# Patient Record
Sex: Female | Born: 1940
Health system: Southern US, Community
[De-identification: ages and names within clinical notes are randomized; demographics above are authoritative.]

## PROBLEM LIST (undated history)

## (undated) DIAGNOSIS — M1990 Unspecified osteoarthritis, unspecified site: Secondary | ICD-10-CM

## (undated) DIAGNOSIS — H269 Unspecified cataract: Secondary | ICD-10-CM

## (undated) DIAGNOSIS — T7840XA Allergy, unspecified, initial encounter: Secondary | ICD-10-CM

## (undated) DIAGNOSIS — R51 Headache: Secondary | ICD-10-CM

## (undated) DIAGNOSIS — E049 Nontoxic goiter, unspecified: Secondary | ICD-10-CM

## (undated) DIAGNOSIS — J302 Other seasonal allergic rhinitis: Secondary | ICD-10-CM

## (undated) DIAGNOSIS — R112 Nausea with vomiting, unspecified: Secondary | ICD-10-CM

## (undated) DIAGNOSIS — Z9889 Other specified postprocedural states: Secondary | ICD-10-CM

## (undated) DIAGNOSIS — C801 Malignant (primary) neoplasm, unspecified: Secondary | ICD-10-CM

## (undated) DIAGNOSIS — E785 Hyperlipidemia, unspecified: Secondary | ICD-10-CM

## (undated) DIAGNOSIS — G709 Myoneural disorder, unspecified: Secondary | ICD-10-CM

## (undated) DIAGNOSIS — H409 Unspecified glaucoma: Secondary | ICD-10-CM

## (undated) DIAGNOSIS — K219 Gastro-esophageal reflux disease without esophagitis: Secondary | ICD-10-CM

## (undated) DIAGNOSIS — Z8719 Personal history of other diseases of the digestive system: Secondary | ICD-10-CM

## (undated) HISTORY — PX: BLADDER SURGERY: SHX569

## (undated) HISTORY — PX: COLONOSCOPY: SHX174

## (undated) HISTORY — PX: TONSILLECTOMY: SUR1361

## (undated) HISTORY — DX: Hyperlipidemia, unspecified: E78.5

## (undated) HISTORY — PX: ROTATOR CUFF REPAIR: SHX139

## (undated) HISTORY — DX: Unspecified cataract: H26.9

## (undated) HISTORY — PX: SPINAL FUSION: SHX223

## (undated) HISTORY — DX: Myoneural disorder, unspecified: G70.9

## (undated) HISTORY — PX: BUNIONECTOMY: SHX129

## (undated) HISTORY — DX: Unspecified glaucoma: H40.9

## (undated) HISTORY — DX: Allergy, unspecified, initial encounter: T78.40XA

## (undated) HISTORY — PX: JOINT REPLACEMENT: SHX530

---

## 1998-10-17 ENCOUNTER — Other Ambulatory Visit: Admission: RE | Admit: 1998-10-17 | Discharge: 1998-10-17 | Payer: Self-pay | Admitting: Obstetrics and Gynecology

## 2000-01-01 ENCOUNTER — Other Ambulatory Visit: Admission: RE | Admit: 2000-01-01 | Discharge: 2000-01-01 | Payer: Self-pay | Admitting: Obstetrics and Gynecology

## 2001-01-04 ENCOUNTER — Encounter (INDEPENDENT_AMBULATORY_CARE_PROVIDER_SITE_OTHER): Payer: Self-pay | Admitting: Specialist

## 2001-01-04 ENCOUNTER — Other Ambulatory Visit: Admission: RE | Admit: 2001-01-04 | Discharge: 2001-01-04 | Payer: Self-pay | Admitting: Gastroenterology

## 2001-03-31 ENCOUNTER — Other Ambulatory Visit: Admission: RE | Admit: 2001-03-31 | Discharge: 2001-03-31 | Payer: Self-pay | Admitting: Obstetrics and Gynecology

## 2002-02-02 ENCOUNTER — Ambulatory Visit (HOSPITAL_COMMUNITY): Admission: RE | Admit: 2002-02-02 | Discharge: 2002-02-02 | Payer: Self-pay | Admitting: Neurology

## 2002-05-10 ENCOUNTER — Other Ambulatory Visit: Admission: RE | Admit: 2002-05-10 | Discharge: 2002-05-10 | Payer: Self-pay | Admitting: Obstetrics and Gynecology

## 2002-06-05 ENCOUNTER — Observation Stay (HOSPITAL_COMMUNITY): Admission: AD | Admit: 2002-06-05 | Discharge: 2002-06-06 | Payer: Self-pay | Admitting: *Deleted

## 2002-06-06 ENCOUNTER — Encounter: Payer: Self-pay | Admitting: *Deleted

## 2002-11-02 ENCOUNTER — Encounter (INDEPENDENT_AMBULATORY_CARE_PROVIDER_SITE_OTHER): Payer: Self-pay | Admitting: Specialist

## 2002-11-02 ENCOUNTER — Ambulatory Visit (HOSPITAL_BASED_OUTPATIENT_CLINIC_OR_DEPARTMENT_OTHER): Admission: RE | Admit: 2002-11-02 | Discharge: 2002-11-02 | Payer: Self-pay | Admitting: Urology

## 2003-07-17 ENCOUNTER — Other Ambulatory Visit: Admission: RE | Admit: 2003-07-17 | Discharge: 2003-07-17 | Payer: Self-pay | Admitting: Obstetrics and Gynecology

## 2003-09-03 ENCOUNTER — Ambulatory Visit (HOSPITAL_COMMUNITY): Admission: RE | Admit: 2003-09-03 | Discharge: 2003-09-03 | Payer: Self-pay | Admitting: Gastroenterology

## 2004-05-08 ENCOUNTER — Ambulatory Visit: Payer: Self-pay | Admitting: Internal Medicine

## 2005-03-18 ENCOUNTER — Ambulatory Visit: Payer: Self-pay | Admitting: Internal Medicine

## 2005-03-23 ENCOUNTER — Ambulatory Visit: Payer: Self-pay | Admitting: Internal Medicine

## 2005-04-01 ENCOUNTER — Ambulatory Visit: Payer: Self-pay | Admitting: Gastroenterology

## 2006-01-11 ENCOUNTER — Encounter: Admission: RE | Admit: 2006-01-11 | Discharge: 2006-01-11 | Payer: Self-pay | Admitting: Specialist

## 2006-04-01 ENCOUNTER — Ambulatory Visit: Payer: Self-pay | Admitting: Internal Medicine

## 2006-04-01 LAB — CONVERTED CEMR LAB
ALT: 29 units/L (ref 0–40)
AST: 28 units/L (ref 0–37)
Albumin: 3.8 g/dL (ref 3.5–5.2)
Cholesterol: 173 mg/dL (ref 0–200)
Creatinine, Ser: 0.9 mg/dL (ref 0.4–1.2)
Crystals: NEGATIVE
Eosinophil percent: 5.5 % — ABNORMAL HIGH (ref 0.0–5.0)
Epithelial cells, urine: NEGATIVE /lpf
Folate: >20 ng/mL
GFR calc non Af Amer: 66 mL/min
Glomerular Filtration Rate, Af Am: 80 mL/min/{1.73_m2}
Glucose, Bld: 98 mg/dL (ref 70–99)
HCT: 43.5 % (ref 36.0–46.0)
HDL: 55.7 mg/dL (ref >39.0)
Hemoglobin, Urine: NEGATIVE
LDL Cholesterol: 99 mg/dL (ref 0–99)
MCHC: 33.6 g/dL (ref 30.0–36.0)
Mucus, UA: NEGATIVE
Neutro Abs: 2.5 10*3/uL (ref 1.4–7.7)
Platelets: 309 10*3/uL (ref 150–400)
RBC: 4.47 M/uL (ref 3.87–5.11)
RDW: 12.6 % (ref 11.5–14.6)
Sodium: 143 meq/L (ref 135–145)
T4, Total: 5.6 ug/dL (ref 5.0–12.5)
Total Bilirubin: 0.8 mg/dL (ref 0.3–1.2)
Total Protein, Urine: NEGATIVE mg/dL
WBC: 4.7 10*3/uL (ref 4.5–10.5)

## 2006-04-06 ENCOUNTER — Ambulatory Visit: Payer: Self-pay | Admitting: Internal Medicine

## 2006-05-05 ENCOUNTER — Ambulatory Visit: Payer: Self-pay | Admitting: Internal Medicine

## 2006-06-18 ENCOUNTER — Ambulatory Visit: Payer: Self-pay | Admitting: Gastroenterology

## 2006-07-14 ENCOUNTER — Inpatient Hospital Stay (HOSPITAL_COMMUNITY): Admission: RE | Admit: 2006-07-14 | Discharge: 2006-07-15 | Payer: Self-pay | Admitting: Orthopedic Surgery

## 2007-03-02 ENCOUNTER — Ambulatory Visit (HOSPITAL_COMMUNITY): Admission: RE | Admit: 2007-03-02 | Discharge: 2007-03-02 | Payer: Self-pay | Admitting: Obstetrics and Gynecology

## 2007-04-11 ENCOUNTER — Ambulatory Visit: Payer: Self-pay | Admitting: Internal Medicine

## 2007-04-11 DIAGNOSIS — M1990 Unspecified osteoarthritis, unspecified site: Secondary | ICD-10-CM | POA: Insufficient documentation

## 2007-04-11 DIAGNOSIS — M545 Low back pain, unspecified: Secondary | ICD-10-CM | POA: Insufficient documentation

## 2007-04-11 DIAGNOSIS — Z860100 Personal history of colon polyps, unspecified: Secondary | ICD-10-CM | POA: Insufficient documentation

## 2007-04-11 DIAGNOSIS — R1031 Right lower quadrant pain: Secondary | ICD-10-CM | POA: Insufficient documentation

## 2007-05-12 ENCOUNTER — Ambulatory Visit: Payer: Self-pay | Admitting: Internal Medicine

## 2007-05-12 LAB — CONVERTED CEMR LAB
Basophils Relative: 0.6 % (ref 0.0–1.0)
Bilirubin, Direct: 0.2 mg/dL (ref 0.0–0.3)
CO2: 28 meq/L (ref 19–32)
Creatinine, Ser: 0.9 mg/dL (ref 0.4–1.2)
Eosinophils Relative: 3.1 % (ref 0.0–5.0)
GFR calc Af Amer: 80 mL/min
Glucose, Bld: 97 mg/dL (ref 70–99)
HCT: 39.9 % (ref 36.0–46.0)
HDL: 55.3 mg/dL (ref >39.0)
Hemoglobin: 13.7 g/dL (ref 12.0–15.0)
LDL Cholesterol: 78 mg/dL (ref 0–99)
Lymphocytes Relative: 23 % (ref 12.0–46.0)
Monocytes Absolute: 0.6 10*3/uL (ref 0.2–0.7)
Monocytes Relative: 11.2 % — ABNORMAL HIGH (ref 3.0–11.0)
Neutro Abs: 3.1 10*3/uL (ref 1.4–7.7)
Neutrophils Relative %: 62.1 % (ref 43.0–77.0)
Potassium: 4 meq/L (ref 3.5–5.1)
RDW: 12.8 % (ref 11.5–14.6)
Sodium: 142 meq/L (ref 135–145)
TSH: 1.35 microintl units/mL (ref 0.35–5.50)
Total Bilirubin: 1 mg/dL (ref 0.3–1.2)
Total Protein: 6.3 g/dL (ref 6.0–8.3)
VLDL: 14 mg/dL (ref 0–40)
Vitamin B-12: 765 pg/mL (ref 211–911)
WBC: 5 10*3/uL (ref 4.5–10.5)

## 2007-05-15 ENCOUNTER — Encounter: Payer: Self-pay | Admitting: Internal Medicine

## 2007-05-17 ENCOUNTER — Encounter: Payer: Self-pay | Admitting: Internal Medicine

## 2007-06-23 ENCOUNTER — Encounter: Payer: Self-pay | Admitting: Internal Medicine

## 2007-06-30 ENCOUNTER — Encounter: Payer: Self-pay | Admitting: Internal Medicine

## 2008-02-13 ENCOUNTER — Encounter: Payer: Self-pay | Admitting: Internal Medicine

## 2008-04-30 ENCOUNTER — Ambulatory Visit: Payer: Self-pay | Admitting: Internal Medicine

## 2008-04-30 LAB — CONVERTED CEMR LAB
ALT: 23 units/L (ref 0–35)
AST: 27 units/L (ref 0–37)
Albumin: 3.8 g/dL (ref 3.5–5.2)
Alkaline Phosphatase: 53 units/L (ref 39–117)
BUN: 20 mg/dL (ref 6–23)
Basophils Relative: 0.9 % (ref 0.0–3.0)
Chloride: 111 meq/L (ref 96–112)
Creatinine, Ser: 0.9 mg/dL (ref 0.4–1.2)
Eosinophils Absolute: 0.1 10*3/uL (ref 0.0–0.7)
Eosinophils Relative: 2.9 % (ref 0.0–5.0)
Glucose, Bld: 99 mg/dL (ref 70–99)
HCT: 40.9 % (ref 36.0–46.0)
HDL: 63.8 mg/dL (ref >39.0)
MCV: 96.5 fL (ref 78.0–100.0)
Monocytes Relative: 9.3 % (ref 3.0–12.0)
Neutrophils Relative %: 65.4 % (ref 43.0–77.0)
Platelets: 253 10*3/uL (ref 150–400)
Potassium: 4.5 meq/L (ref 3.5–5.1)
Pro B Natriuretic peptide (BNP): 22 pg/mL (ref 0.0–100.0)
RBC: 4.24 M/uL (ref 3.87–5.11)
Total CHOL/HDL Ratio: 2.9
Total Protein: 6.5 g/dL (ref 6.0–8.3)
WBC: 4.4 10*3/uL — ABNORMAL LOW (ref 4.5–10.5)

## 2008-05-07 ENCOUNTER — Ambulatory Visit: Payer: Self-pay | Admitting: Internal Medicine

## 2008-12-08 ENCOUNTER — Encounter: Admission: RE | Admit: 2008-12-08 | Discharge: 2008-12-08 | Payer: Self-pay | Admitting: Orthopedic Surgery

## 2009-04-03 ENCOUNTER — Encounter: Payer: Self-pay | Admitting: Internal Medicine

## 2009-04-17 ENCOUNTER — Telehealth: Payer: Self-pay | Admitting: Internal Medicine

## 2009-04-17 ENCOUNTER — Ambulatory Visit: Payer: Self-pay | Admitting: Internal Medicine

## 2009-04-17 DIAGNOSIS — R079 Chest pain, unspecified: Secondary | ICD-10-CM

## 2009-04-17 DIAGNOSIS — R0789 Other chest pain: Secondary | ICD-10-CM | POA: Insufficient documentation

## 2009-04-18 ENCOUNTER — Ambulatory Visit: Payer: Self-pay | Admitting: Internal Medicine

## 2009-04-18 DIAGNOSIS — E049 Nontoxic goiter, unspecified: Secondary | ICD-10-CM | POA: Insufficient documentation

## 2009-04-18 DIAGNOSIS — Z87891 Personal history of nicotine dependence: Secondary | ICD-10-CM | POA: Insufficient documentation

## 2009-04-18 LAB — CONVERTED CEMR LAB
BUN: 17 mg/dL (ref 6–23)
Basophils Absolute: 0.1 10*3/uL (ref 0.0–0.1)
Basophils Relative: 1.2 % (ref 0.0–3.0)
Bilirubin Urine: NEGATIVE
Bilirubin, Direct: 0.1 mg/dL (ref 0.0–0.3)
CO2: 28 meq/L (ref 19–32)
Calcium: 9.1 mg/dL (ref 8.4–10.5)
Chloride: 108 meq/L (ref 96–112)
Creatinine, Ser: 0.8 mg/dL (ref 0.4–1.2)
Eosinophils Absolute: 0.2 10*3/uL (ref 0.0–0.7)
Glucose, Bld: 92 mg/dL (ref 70–99)
Hemoglobin, Urine: NEGATIVE
Leukocytes, UA: NEGATIVE
MCHC: 34.5 g/dL (ref 30.0–36.0)
MCV: 98.8 fL (ref 78.0–100.0)
Monocytes Absolute: 0.6 10*3/uL (ref 0.1–1.0)
Neutrophils Relative %: 56.6 % (ref 43.0–77.0)
Nitrite: NEGATIVE
Platelets: 269 10*3/uL (ref 150.0–400.0)
RBC: 4.01 M/uL (ref 3.87–5.11)
RDW: 12.9 % (ref 11.5–14.6)
TSH: 1.22 microintl units/mL (ref 0.35–5.50)
Total Protein: 6.3 g/dL (ref 6.0–8.3)
pH: 6.5 (ref 5.0–8.0)

## 2009-05-02 ENCOUNTER — Encounter: Admission: RE | Admit: 2009-05-02 | Discharge: 2009-05-02 | Payer: Self-pay | Admitting: Internal Medicine

## 2009-06-06 ENCOUNTER — Encounter: Payer: Self-pay | Admitting: Internal Medicine

## 2009-07-16 ENCOUNTER — Telehealth (INDEPENDENT_AMBULATORY_CARE_PROVIDER_SITE_OTHER): Payer: Self-pay | Admitting: *Deleted

## 2009-10-10 ENCOUNTER — Encounter: Payer: Self-pay | Admitting: Internal Medicine

## 2010-01-14 ENCOUNTER — Telehealth: Payer: Self-pay | Admitting: Internal Medicine

## 2010-06-22 ENCOUNTER — Encounter: Payer: Self-pay | Admitting: Obstetrics and Gynecology

## 2010-06-30 ENCOUNTER — Ambulatory Visit
Admission: RE | Admit: 2010-06-30 | Discharge: 2010-06-30 | Payer: Self-pay | Source: Home / Self Care | Attending: Internal Medicine | Admitting: Internal Medicine

## 2010-06-30 ENCOUNTER — Other Ambulatory Visit: Payer: Self-pay | Admitting: Internal Medicine

## 2010-06-30 ENCOUNTER — Encounter: Payer: Self-pay | Admitting: Internal Medicine

## 2010-06-30 DIAGNOSIS — M25519 Pain in unspecified shoulder: Secondary | ICD-10-CM | POA: Insufficient documentation

## 2010-06-30 LAB — HEPATIC FUNCTION PANEL
ALT: 25 U/L (ref 0–35)
AST: 28 U/L (ref 0–37)
Alkaline Phosphatase: 59 U/L (ref 39–117)
Bilirubin, Direct: 0.1 mg/dL (ref 0.0–0.3)
Total Protein: 6.8 g/dL (ref 6.0–8.3)

## 2010-06-30 LAB — CBC WITH DIFFERENTIAL/PLATELET
Basophils Relative: 0.7 % (ref 0.0–3.0)
Eosinophils Relative: 4.7 % (ref 0.0–5.0)
Lymphocytes Relative: 22.8 % (ref 12.0–46.0)
Monocytes Relative: 11.3 % (ref 3.0–12.0)
Neutrophils Relative %: 60.5 % (ref 43.0–77.0)
RBC: 4.34 Mil/uL (ref 3.87–5.11)
WBC: 6 10*3/uL (ref 4.5–10.5)

## 2010-06-30 LAB — URINALYSIS, ROUTINE W REFLEX MICROSCOPIC
Ketones, ur: NEGATIVE
Nitrite: NEGATIVE
Specific Gravity, Urine: 1.025 (ref 1.000–1.030)
Urine Glucose: NEGATIVE
Urobilinogen, UA: 0.2 (ref 0.0–1.0)

## 2010-06-30 LAB — TSH: TSH: 1.67 u[IU]/mL (ref 0.35–5.50)

## 2010-06-30 LAB — BASIC METABOLIC PANEL
Calcium: 9.6 mg/dL (ref 8.4–10.5)
Creatinine, Ser: 0.9 mg/dL (ref 0.4–1.2)

## 2010-06-30 LAB — LIPID PANEL
HDL: 71 mg/dL (ref >39.00)
Total CHOL/HDL Ratio: 3

## 2010-07-01 NOTE — Progress Notes (Signed)
  Phone Note Other Incoming   Request: Send information Summary of Call: [facility] requested last o.v. and last labs. [facility] Faxed 07/16/09 at 8:55 a.m.

## 2010-07-01 NOTE — Letter (Signed)
Summary: [facility]  [facility]   Imported By: Lester Springdale 06/19/2009 08:18:39  _____________________________________________________________________  External Attachment:    Type:   Image     Comment:   External Document

## 2010-07-01 NOTE — Letter (Signed)
Summary: [facility]  [facility]   Imported By: Sherian Rein 11/06/2009 07:44:26  _____________________________________________________________________  External Attachment:    Type:   Image     Comment:   External Document

## 2010-07-01 NOTE — Progress Notes (Signed)
Summary: [facility]  Phone Note Refill Request Message from:  Pharmacy  Refills Requested: Medication #1:  ZOLPIDEM TARTRATE 10 MG  TABS 1/2 or 1 by mouth at hs prn   Dosage confirmed as above?Dosage Confirmed   Supply Requested: 30  Method Requested: Telephone to Pharmacy Initial call taken by: Lanier Prude, [facility]),  January 14, 2010 11:05 AM  Follow-up for Phone Call        ok 3 ref Follow-up by: Tresa Garter MD,  January 14, 2010 2:00 PM  Additional Follow-up for Phone Call Additional follow up Details #1::        Rx called to pharmacy Additional Follow-up by: Lanier Prude, [facility]),  January 14, 2010 3:15 PM    Prescriptions: ZOLPIDEM TARTRATE 10 MG  TABS (ZOLPIDEM TARTRATE) 1/2 or 1 by mouth at hs prn  #30 x 3   Entered by:   Lanier Prude, [facility])   Authorized by:   Tresa Garter MD   Signed by:   Lanier Prude, CMA(AAMA) on 01/14/2010   Method used:   Telephoned to ...       [facility] [address] [phone]* (retail)       [address]       [place], [place]  [address]       Ph: [phone] or [phone]       Fax: [phone]   RxID:   [id number]

## 2010-07-09 NOTE — Assessment & Plan Note (Signed)
Summary: yearly / medicare / # /cd   Vital Signs:  Patient profile:   70 year old female Height:      63 inches Weight:      190 pounds BMI:     33.78 Temp:     98.5 degrees F oral Pulse rate:   104 / minute Pulse rhythm:   regular Resp:     16 per minute BP sitting:   138 / 82  (left arm) Cuff size:   regular  Vitals Entered By: Lanier Prude, CMA(AAMA) (June 30, 2010 9:44 AM) CC: MWV Is Patient Diabetic? No   CC:  MWV.  History of Present Illness: The patient presents for a preventive health examination  Patient past medical history, social history, and family history reviewed in detail no significant changes.  Patient is physically active. Depression is negative and mood is good. Hearing is normal, and able to perform activities of daily living. Risk of falling is negligible and home safety has been reviewed and is appropriate. Patient has normal height, weight, and visual acuity. Patient has been counseled on age-appropriate routine health concerns for screening and prevention. Education, counseling done.   Preventive Screening-Counseling & Management  Alcohol-Tobacco     Alcohol drinks/day: 2-3 /wk     Alcohol type: wine     Smoking Status: never  Caffeine-Diet-Exercise     Caffeine use/day: 1per week     Does Patient Exercise: yes     Type of exercise: trainer, bicycle     Times/week: 7     Depression Counseling: further diagnostic testing and/or other treatment is indicated  Hep-HIV-STD-Contraception     Hepatitis Risk: no risk noted     Dental Visit-last 6 months yes     SBE monthly: yes     Sun Exposure-Excessive: no  Safety-Violence-Falls     Seat Belt Use: yes     Helmet Use: n/a     Firearms in the Home: firearms in the home     Smoke Detectors: yes     Violence in the Home: no risk noted     Sexual Abuse: no     Fall Risk: none  Current Medications (verified): 1)  Prevacid 30 Mg Cpdr (Lansoprazole) .[name].. 1 Po Bid 2)  Zyrtec Allergy 10 Mg  Tabs  (Cetirizine Hcl) .[name].. 1 Once Daily Prn 3)  Synthroid 25 Mcg Tabs (Levothyroxine Sodium) .... Take 1 By Mouth Qd 4)  Celebrex 200 Mg  Caps (Celecoxib) .[name].. 1 Once Daily Prn 5)  Neurontin 100 Mg Caps (Gabapentin) .[name].. 1 At [facility] As Needed 6)  Fish Oil   Oil (Fish Oil) .[name].. 1 Two Times A Day 7)  Zolpidem Tartrate 10 Mg  Tabs (Zolpidem Tartrate) .... 1/2 or 1 By Mouth At [facility] Prn 8)  Multi-Vitamin  Tabs (Multiple Vitamin) .... Once Daily 9)  Tramadol Hcl 50 Mg  Tabs (Tramadol Hcl) .[name].. 1-2 Tabs By Mouth Two Times A Day As Needed Pain 10)  Calcium-Magnesium 500-250 Mg Tabs (Calcium-Magnesium) .[name].. 1 By Mouth Once Daily 11)  Coq10 100 Mg Caps (Coenzyme Q10) .[name].. 1 By Mouth Once Daily 12)  Vitamin B-12 1000 Mcg Tabs (Cyanocobalamin) .[name].. 1 By Mouth Once Daily 13)  Biotin 10 Mg Tabs (Biotin) .[name].. 1 By Mouth Once Daily  Allergies (verified): 1)  ! Pcn  Past History:  Family History: Last updated: 04/11/2007 B pancr CA F brain tumor  Past Medical History: Bladder CA 2003 Dr Vonita Moss Low back pain/ spinal stenosis Insomnia gyn  Dr Ambrose Mantle Colonic polyps, hx of due 2012 Hemorroids Osteoarthritis Goiter Simple liver cysts  Past Surgical History: Rot cuff L 2007 Dr Amanda Pea  Social History: Retired Married Former Smoker Regular exercise-yes golf, [employer] 2/wk Smoking Status:  never Caffeine use/day:  1per week Seat Belt Use:  yes Sun Exposure-Excessive:  no Fall Risk:  none Dental Care w/in 6 mos.:  yes Hepatitis Risk:  no risk noted  Review of Systems       The patient complains of difficulty walking.  The patient denies anorexia, fever, vision loss, decreased hearing, hoarseness, chest pain, syncope, dyspnea on exertion, peripheral edema, prolonged cough, headaches, hemoptysis, abdominal pain, melena, hematochezia, severe indigestion/heartburn, hematuria, incontinence, genital sores, muscle weakness, suspicious skin lesions, transient blindness, depression, unusual weight change,  abnormal bleeding, enlarged lymph nodes, angioedema, and breast masses.         Lost wt on diet  Physical Exam  General:  NAD, alert, well-developed, and overweight-appearing.   Head:  Normocephalic and atraumatic without obvious abnormalities. No apparent alopecia or balding. Eyes:  No corneal or conjunctival inflammation noted. EOMI. Perrla.  Ears:  External ear exam shows no significant lesions or deformities.  Otoscopic examination reveals clear canals, tympanic membranes are intact bilaterally without bulging, retraction, inflammation or discharge. Hearing is grossly normal bilaterally. Nose:  WNL Mouth:  WNL Neck:  No deformities, masses, or tenderness noted. Lungs:  Normal respiratory effort, chest expands symmetrically. Lungs are clear to auscultation, no crackles or wheezes. Heart:  Normal rate and regular rhythm. S1 and S2 normal without gallop, murmur, click, rub or other extra sounds. Abdomen:  Bowel sounds positive,abdomen soft and non-tender without masses, organomegaly or hernias noted. Msk:  Lumbar-sacral spine is tender to palpation over paraspinal muscles and painfull with the ROM  Pulses:  R and L carotid,radial,femoral,dorsalis pedis and posterior tibial pulses are full and equal bilaterally Extremities:  No clubbing, cyanosis, edema, or deformity noted with normal full range of motion of all joints.   Neurologic:  No cranial nerve deficits noted. Station and gait are normal. Plantar reflexes are down-going bilaterally. DTRs are symmetrical throughout. Sensory, motor and coordinative functions appear intact. Skin:  Intact without suspicious lesions or rashes Cervical Nodes:  No lymphadenopathy noted Inguinal Nodes:  No significant adenopathy Psych:  Cognition and judgment appear intact. Alert and cooperative with normal attention span and concentration. No apparent delusions, illusions, hallucinations   Impression & Recommendations:  Problem # 1:  WELL ADULT EXAM  (ICD-V70.0) Assessment New Overall doing well, age appropriate education and counseling updated and referral for appropriate preventive services done unless declined, immunizations up to date or declined, diet counseling done if overweight, urged to quit smoking if smokes, most recent labs reviewed and current ordered if appropriate, ecg reviewed or declined (interpretation per ECG scanned in the EMR if done); information regarding Medicare Preventation requirements given if appropriate.  I have personally reviewed the Medicare Annual Wellness questionnaire and have noted 1.   The patient's medical and social history 2.   Their use of alcohol, tobacco or illicit drugs 3.   Their current medications and supplements 4.   The patient's functional ability including ADL's, fall risks, home safety risks and hearing or visual             impairment. 5.   Diet and physical activities 6.   Evidence for depression or mood disorders The patients weight, height, BMI and visual acuity have been recorded in the chart I have made referrals, counseling and  provided education to the patient based review of the above and I have provided the pt with a written personalized care plan for preventive services.   Orders: TLB-BMP (Basic Metabolic Panel-BMET) (80048-METABOL) TLB-CBC Platelet - w/Differential (85025-CBCD) TLB-Hepatic/Liver Function Pnl (80076-HEPATIC) TLB-Lipid Panel (80061-LIPID) TLB-TSH (Thyroid Stimulating Hormone) (84443-TSH) TLB-Udip ONLY (81003-UDIP) Medicare -1st Annual Wellness Visit [phone])  Problem # 2:  SHOULDER PAIN (ICD-719.41) R Assessment: Deteriorated Surgery is pending - she is medically clear for surgery Her updated medication list for this problem includes:    Celebrex 200 Mg Caps (Celecoxib) .[name]... 1 once daily prn    Tramadol Hcl 50 Mg Tabs (Tramadol hcl) .[name]... 1-2 tabs by mouth two times a day as needed pain  Problem # 3:  COLONIC POLYPS, HX OF (ICD-V12.72) Assessment:  Comment Only Colon is pending in 4/12  Problem # 4:  LOW BACK PAIN (ICD-724.2) Assessment: Unchanged  Her updated medication list for this problem includes:    Celebrex 200 Mg Caps (Celecoxib) .[name]... 1 once daily prn    Tramadol Hcl 50 Mg Tabs (Tramadol hcl) .[name]... 1-2 tabs by mouth two times a day as needed pain    Aspirin 81 Mg Tbec (Aspirin) .[name]... 1 by mouth qd  Complete Medication List: 1)  Prevacid 30 Mg Cpdr (Lansoprazole) .[name].. 1 po bid 2)  Zyrtec Allergy 10 Mg Tabs (Cetirizine hcl) .[name].. 1 once daily prn 3)  Synthroid 25 Mcg Tabs (Levothyroxine sodium) .... Take 1 by mouth qd 4)  Celebrex 200 Mg Caps (Celecoxib) .[name].. 1 once daily prn 5)  Neurontin 100 Mg Caps (Gabapentin) .[name].. 1 at hs as needed 6)  Fish Oil Oil (Fish oil) .[name].. 1 two times a day 7)  Zolpidem Tartrate 10 Mg Tabs (Zolpidem tartrate) .... 1/2 or 1 by mouth at hs prn 8)  Multi-vitamin Tabs (Multiple vitamin) .... Once daily 9)  Tramadol Hcl 50 Mg Tabs (Tramadol hcl) .[name].. 1-2 tabs by mouth two times a day as needed pain 10)  Calcium-magnesium 500-250 Mg Tabs (Calcium-magnesium) .[name].. 1 by mouth once daily 11)  Coq10 100 Mg Caps (Coenzyme q10) .[name].. 1 by mouth once daily 12)  Vitamin B-12 1000 Mcg Tabs (Cyanocobalamin) .[name].. 1 by mouth once daily 13)  Biotin 10 Mg Tabs (Biotin) .[name].. 1 by mouth once daily 14)  Aspirin 81 Mg Tbec (Aspirin) .[name].. 1 by mouth qd  Other Orders: T-2 View CXR, Same Day (71020.5TC)  Patient Instructions: 1)  Please schedule a follow-up appointment in 1 year well w/labs. Prescriptions: ZOLPIDEM TARTRATE 10 MG  TABS (ZOLPIDEM TARTRATE) 1/2 or 1 by mouth at hs prn  #90 x 1   Entered and Authorized by:   Tresa Garter MD   Signed by:   Tresa Garter MD on 06/30/2010   Method used:   Print then Give to Patient   RxID:   [id number] TRAMADOL HCL 50 MG  TABS (TRAMADOL HCL) 1-2 tabs by mouth two times a day as needed pain  #360 x 1   Entered and Authorized by:   Tresa Garter  MD   Signed by:   Tresa Garter MD on 06/30/2010   Method used:   Electronically to        [facility] [address] [phone]* (retail)       [address]       [place], [place]  [address]       Ph: [phone] or [phone]  Fax: [phone]   RxID:   [id number] CELEBREX 200 MG  CAPS (CELECOXIB) 1 once daily prn  #90 x 3   Entered and Authorized by:   Tresa Garter MD   Signed by:   Tresa Garter MD on 06/30/2010   Method used:   Electronically to        [facility] [address] [phone]* (retail)       [address]       [place], [place]  [address]       Ph: [phone] or [phone]       Fax: [phone]   RxID:   [id number] SYNTHROID 25 MCG TABS (LEVOTHYROXINE SODIUM) take 1 by mouth qd  #90 x 3   Entered and Authorized by:   Tresa Garter MD   Signed by:   Tresa Garter MD on 06/30/2010   Method used:   Electronically to        [facility] [address] [phone]* (retail)       [address]       [place], [place]  [address]       Ph: [phone] or [phone]       Fax: [phone]   RxID:   [id number] PREVACID 30 MG CPDR (LANSOPRAZOLE) 1 po bid  #180 x 3   Entered and Authorized by:   Tresa Garter MD   Signed by:   Tresa Garter MD on 06/30/2010   Method used:   Electronically to        [facility] [address] [phone]* (retail)       [address]       [place], [place]  [address]       Ph: [phone] or [phone]       Fax: [phone]   RxID:   [id number]    Orders Added: 1)  T-2 View CXR, Same Day [71020.5TC] 2)  TLB-BMP (Basic Metabolic Panel-BMET) [80048-METABOL] 3)  TLB-CBC Platelet - w/Differential [85025-CBCD] 4)  TLB-Hepatic/Liver Function Pnl [80076-HEPATIC] 5)  TLB-Lipid Panel [80061-LIPID] 6)  TLB-TSH (Thyroid Stimulating Hormone) [84443-TSH] 7)  TLB-Udip ONLY [81003-UDIP] 8)  Medicare -1st Annual Wellness Visit  [G0438] 9)  Est. Patient Level IV [61950]   Immunization History:  Pneumovax Immunization History:    Pneumovax:  historical (03/11/2006)  Influenza Immunization History:    Influenza:  historical (03/04/2010)  Tetanus/Td Immunization History:    Tetanus/Td:  historical (02/11/2006)   Immunization History:  Pneumovax Immunization History:    Pneumovax:  Historical (03/11/2006)  Influenza Immunization History:    Influenza:  Historical (03/04/2010)  Tetanus/Td Immunization History:    Tetanus/Td:  Historical (02/11/2006)

## 2010-09-18 ENCOUNTER — Other Ambulatory Visit: Payer: Self-pay | Admitting: Specialist

## 2010-09-18 ENCOUNTER — Ambulatory Visit
Admission: RE | Admit: 2010-09-18 | Discharge: 2010-09-18 | Disposition: A | Discharge status: Home / Self Care | Payer: Medicare Other | Source: Ambulatory Visit | Attending: Specialist | Admitting: Specialist

## 2010-09-18 DIAGNOSIS — M545 Low back pain: Secondary | ICD-10-CM

## 2010-10-17 NOTE — Op Note (Signed)
NAME:  Mariah Farrell, MAPEL NO.:  [id number]   MEDICAL RECORD NO.:  [id number]          PATIENT TYPE:  OIB   LOCATION:  1503                         FACILITY:  [facility]   PHYSICIAN:  Dionne Ano. Gramig III, M.D.DATE OF BIRTH:  1941/05/21   DATE OF PROCEDURE:  07/13/2006  DATE OF DISCHARGE:                               OPERATIVE REPORT   PREOPERATIVE DIAGNOSIS:  1. Chronic impingement syndrome with large rotator cuff tear, left      shoulder.  2. Left ring finger cystic mass.  3. Right thumb CMC joint arthritis.   POSTOPERATIVE DIAGNOSIS:  1. Chronic impingement syndrome with large rotator cuff tear, left      shoulder.  2. Left ring finger cystic mass.  3. Right thumb CMC joint arthritis.   PROCEDURE:  1. Arthroscopy left shoulder with glenohumeral debridement.  2. Arthroscopy with bursectomy and subacromial decompression, left      shoulder.  3. Open distal clavicle resection, left shoulder.  4. Large rotator cuff repair with suture anchor and push lock fixation      involving the supra and infraspinatus tendons.  5. Mucous cyst removal with the DIP joint synovectomy, left ring      finger.  6. Right thumb CMC injection at the base of the thumb joint.   SURGEON:  Dionne Ano. Amanda Pea, M.D.   ASSISTANT:  Karie Chimera, P.A.-C.   COMPLICATIONS:  None.   ANESTHESIA:  General with preoperative interscalene block.   TOURNIQUET TIME:  Less than of 20 minutes for the left ring finger mass  removal.   INDICATIONS FOR PROCEDURE:  [name] is a 70 year old female  who presents with the above mentioned diagnosis.  I have counseled her  regarding the risks and benefits of surgery including risk of infection,  bleeding, anesthesia, damage to normal structures, and failure of  surgery to accomplish its intended goals of relieving symptoms and  restoring function.  With this in mind, she desires to proceed.  All  questions have been encouraged and answered  preoperatively.   OPERATIVE PROCEDURE:  The patient was seen by myself and anesthesia,  taken to the operating suite, and underwent the smooth induction of  general anesthesia.  Preoperative interscalene block was in excellent  working fashion.  She was placed in the Gackle chair, appropriately  padded with a bump under her left shoulder, and was positioned nicely.  Pulses were checked, body parts were padded, and the left shoulder was  then evaluated under anesthesia revealing adequate stability.  She was  then prepped and draped with DuraPrep x2 and the sterile field was then  secured.   Following this, arthroscopy was performed.  I created a posterolateral  portal and an anterior working portal and performed a systematic  evaluation.  An arthroscopic shaver was used to debride the area.  The  patient tolerated this well.  A large rotator cuff was notable.  Following this, the patient had placement of the arthroscope in the  subacromial space. With a combination thermal ablator and arthroscopic  shaver, I performed bursectomy and subacromial decompression.  The large  delaminated  rotator cuff tear was notable and identified.  Following  this, I then evaluated the [facility] joint which looked significantly abnormal  and synovitic.  This was debrided, as well.  At this juncture, the  subacromial decompression, bursectomy, and glenohumeral debridement was  accomplished and I then removed the arthroscope.   I made an incision and performed a distal clavicle resection.  Dissection was carried down and a distal 9 mm of clavicle was removed  with the oscillating saw.  Bone wax was placed and the joint was nicely  debrided. I then repaired this with 0 Vicryl suture.   Following this, I then performed a transverse incision along Langer's  lines just below the acromion.  Dissection was carried down the interval  between the anterior and middle raphe of the deltoid muscle.  I  identified this and  performed mobilization of a very large rotator cuff  tear.  This is a delaminating tear with significant abrasions about the  cuff in general.  This delaminated tear was mobilized accordingly.  I  then created a foot print for suture anchor.  I medialized the cuff  somewhat and mobilized portions of the bursa off of the subacromial  space.  The patient tolerated this well.  Following this, I then placed  two 5.5 Arthrex corkscrew anchors with four leading sutures a piece.  These were then placed up through the medial portion of the rotator cuff  and tied down with the arm in abduction.  Following this, I placed two  push lock anchors and placed sutures through the eyelets to reinforce  the cuff repair.  The patient tolerated this well.  The patient had  exiting strands from the cuff repair and this was a massive cuff repair  in my opinion.  These were cinched down with the push lock without  difficulty.   Following this, I placed the patient through a full range of motion.  She looked excellent and had no impingement. I was pleased with the  findings.  I then repaired the anterior and middle deltoid raphe and, of  course, performed closure of the subcu with 3-0 Vicryl and skin edge  with Prolene.  The portal sites were closed, as well.   Following this, I then prepped and draped the left ring finger with  Betadine scrub and paint.  She had her ring taken off preoperatively as  it was quite tight and this was done without difficulty.  I performed an  incision. Dissection was carried down and mobilization of the cyst off  of the DIP joint was accomplished.  This was a mucous cyst. The joint  was opened and debrided with a synovectomy rongeur.  I then placed bone  wax about the area and closed the wound with Prolene and the tourniquet  deflated.   Following this, I then performed a CMC joint injection with Celestone and lidocaine which she tolerated well.  Following this, she was placed   in a shoulder abduction orthosis. All wounds were sterilely dressed.  She was taken to the recovery room.  She will be monitored in the  recovery room and be spending the night for IV antibiotics, pain  management according to protocol, and general postop observation.  It  has been a pleasure to participate in her care and we look forward to  participating in her postop recovery.           ______________________________  Dionne Ano. Everlene Other, M.D.     [facility]  D:  07/13/2006  T:  07/13/2006  Job:  [id number]

## 2010-10-17 NOTE — Op Note (Signed)
NAME:  Mariah Farrell, Mariah Farrell                       ACCOUNT NO.:  [id number]   MEDICAL RECORD NO.:  [id number]                   PATIENT TYPE:  AMB   LOCATION:  [facility]                                 FACILITY:  [facility]   PHYSICIAN:  Maretta Bees. Vonita Moss, M.D.             DATE OF BIRTH:  07-24-1940   DATE OF PROCEDURE:  11/02/2002  DATE OF DISCHARGE:                                 OPERATIVE REPORT   PREOPERATIVE DIAGNOSIS:  Recurrent cystitis but rule out bladder carcinoma.   POSTOPERATIVE DIAGNOSIS:  Recurrent cystitis but rule out bladder carcinoma.   PROCEDURE:  Cystoscopy, bilateral retrograde pyelograms with interpretation,  TUR bladder tumor.   SURGEON:  Maretta Bees. Vonita Moss, M.D.   ANESTHESIA:  General.   INDICATIONS FOR PROCEDURE:  This is 70 year old lady has had a two year  history of recurrent bouts of pressure, frequency, and urgency responding to  antibiotics. Recent episode has not cleared very well symptomatically and  despite a urine culture that was negative, she still had persistent  symptoms. She has had no gross hematuria. She underwent cystoscopy earlier  this week and she had raised mucosa around the left ureteral orifice  worrisome for severe inflammation and possible TCC. She is brought to the OR  today for evaluation and therapy.   DESCRIPTION OF PROCEDURE:  The patient was brought to the operating room,  placed in lithotomy position. The external genitalia were prepped and draped  in the usual fashion. She was cystoscoped around and beyond the left  ureteral orifice where raised papillary lesions were very worrisome for TCC  of the bladder. The rest of the bladder had no lesions and the ureteral  orifices were clear. Photos were taken. Bilateral retrograde pyelograms were  obtained using the cone tip catheter and the upper tracts revealed no  filling defects or obstruction. I then inserted a resectoscope and sent a  specimen for frozen section and the rest of the  tissue for permanent section  and totally removed this 3 x 3 cm area of tumors. Because of the CT findings  earlier this week of a thickened bladder wall, I made sure I got some deep  biopsies for pathology. Because of that, I felt that after coagulating tumor  with essentially no blood loss and very good hemostasis that I should leave  in a Foley catheter for a couple of days so a 22 [place] Foley catheter was  inserted and connected to close drainage with clear irrigation and a B&O  suppository inserted per rectum. She was then taken to the recovery room in  good condition having tolerated the procedure well.                                               Maretta Bees. Vonita Moss, M.D.  LJP/MEDQ  D:  11/02/2002  T:  11/02/2002  Job:  [id number]

## 2010-10-17 NOTE — Discharge Summary (Signed)
NAME:  Mariah Farrell, Mariah Farrell                       ACCOUNT NO.:  [id number]   MEDICAL RECORD NO.:  [id number]                   PATIENT TYPE:  INP   LOCATION:  6525                                 FACILITY:  [facility]   PHYSICIAN:  Cecil Cranker, M.D. [facility]         DATE OF BIRTH:  08-23-1940   DATE OF ADMISSION:  06/05/2002  DATE OF DISCHARGE:  06/06/2002                           DISCHARGE SUMMARY - REFERRING   PROCEDURES:  Exercise Cardiolite.   HOSPITAL COURSE:  The patient is a 70 year old female with no known history  of coronary artery disease.  She was seen by Dr. Glennon Hamilton in the office  on 06/05/02, for chest pain.  It was considered that she was at increased risk  for coronary artery disease, so she was admitted to rule out myocardial  infarction and for further evaluation.   On 06/06/01, her enzymes were negative for myocardial infarction, and she had  a Cardiolite performed.  The patient exercised under the Bruce protocol and  reached a heart rate of 162.  Her goal was 133, with a 100% of her target  heart rate being 157.  She exercised for a total of 7 minutes and 40  seconds.  She had no chest pain.  She had no shortness of breath.  Her EKG  was without acute changes.  The Cardiolite images were reviewed, and showed  no scar, no ischemia, and an ejection fraction of 59%.  Her findings were  reviewed with Dr. Glennon Hamilton who felt that the patient could be discharged  home with outpatient followup.   On 06/06/01, the patient was considered stable for discharge with outpatient  followup with her primary care physician, and cardiology followup on a  p.r.n. basis.   LABORATORY DATA:  Hemoglobin 13.6, hematocrit 40, white blood cell count  8.2, platelets 285.  Sodium 144, potassium 3.6, chloride 110, CO2 29, BUN  11, creatinine 0.9, glucose 95.  Albumin 3.4.  CMET values within normal  limits.  Serial CK-MB and troponin-I negative for myocardial infarction.  Total cholesterol  172, triglycerides 76, HDL 70, LDL 87.   CONDITION ON DISCHARGE:  Stable.   DISCHARGE DIAGNOSES:  1. Chest pain, negative myocardial infarction by enzymes and no scar or     ischemia by Cardiolite.  The patient is to follow up with her primary     care physician and seek cardiology on a p.r.n. basis.  2. Esophageal reflux disease.  3. History of allergy to PENICILLIN.  4. Elevated body mass index.   ACTIVITY:  As tolerated.   DIET:  She is to stick to a low fat diet.   FOLLOWUP:  1. She is to follow up with Dr. Posey Rea.  2. She is to follow up with Dr. Corinda Gubler p.r.n.   DISCHARGE MEDICATIONS:  1. Prevacid 30 mg b.i.d.  2. Zyrtec q.d.  3. Multivitamin, calcium, and chondroitin as at home.  4. Estrogen patch as prior to  admission.  5. Aspirin 81 mg q.d.       Lavella Hammock, P.A. [facility]                  E. Graceann Congress, M.D. [facility]    RG/MEDQ  D:  06/06/2002  T:  06/06/2002  Job:  [id number]   cc:   Georgina Quint. Plotnikov, M.D. [facility]  [address]  [place]  [place] [address]  Fax: 1

## 2010-10-17 NOTE — Assessment & Plan Note (Signed)
[facility]                         GASTROENTEROLOGY OFFICE NOTE   NAME:Mariah Farrell, Mariah Farrell                    MRN:          [id number]  DATE:06/18/2006                            DOB:          1941/01/01    HISTORY OF PRESENT ILLNESS:  Mariah Farrell has had difficulty with  dysphagia for years. I dilated her esophagus in 1997 at which time she  had a 3 cm hiatal hernia and severe stricturing. She has been on a  proton pump inhibitor since. She takes Prevacid twice daily. She takes  Celebrex, Zyrtec, Zoloft, furosemide, temazepam.   PHYSICAL EXAMINATION:  She weighed 206. Blood pressure 118/88, pulse of  72 and regular.  HEENT: All unremarkable.  BREASTS: Deferred.  EXTREMITIES: Unremarkable.   IMPRESSION:  1. History of esophagus stricture dilatation, doing well on Prevacid      x2 daily.  2. Mild obesity.   RECOMMENDATIONS:  She should continue on her medicine and I refilled her  medicine. She said that with my retirement she would consider going to  Dr. Kinnie Scales which I told her would be certainly satisfactory and I would  be more than happy to send him all her records at the appropriate time.     Ulyess Mort, MD  Electronically Signed    SML/MedQ  DD: 06/18/2006  DT: 06/18/2006  Job #: [id number]   cc:   Georgina Quint. Plotnikov, MD

## 2010-10-17 NOTE — Assessment & Plan Note (Signed)
[facility]                             PRIMARY CARE OFFICE NOTE   NAME:Mariah Farrell, Mariah Farrell                    MRN:          [id number]  DATE:04/06/2006                            DOB:          1941-01-18    The patient is a 70 year old female who presents for a wellness examination.  She also has a number of complaints.   ALLERGIES:  PENICILLIN.   Past medical history, family history, and social history as per December 19, 2003 note.  She had a ruptured disk in the lower back in August, and a  rotator cuff injury to the left shoulder in May.   CURRENT MEDICATIONS:  Reviewed.  She does take Celebrex lately on a daily  basis.   REVIEW OF SYSTEMS:  Came back sick from [place] and [place] cruise, has been  having dry cough, feeling weak, unable to sleep, arthritis problems.  The  rest is negative.   PHYSICAL EXAMINATION:  Blood pressure 126/87, pulse 98, temp 98.3, weight  204 pounds.  She is in no acute distress.  HEENT:  Moist mucosa.  NECK:  Supple, no thyromegaly or bruit.  LUNGS:  Clear, no wheeze or rales.  HEART:  S1, S2, no murmur, no gallop.  ABDOMEN:  Soft, nontender, no organomegaly, no masses felt.  LOWER EXTREMITIES:  Without edema.  She is alert and cooperative, not depressed.  SKIN:  With aging changes.  Hands and feet with arthritis deformities.   LABORATORIES:  EKG today with sinus tachy, heart rate 89.  On April 01, 2006, CBC normal, cholesterol 173, CMET normal.  Free T4 0.8 (normal 0.9-  1.8).  Urinalysis normal.   ASSESSMENT AND PLAN:  1. Normal wellness examination. Age/health-related issues discussed.      Healthy lifestyle discussed.  We will give her Zostavax injection      today.  She will come back for a hepatitis A and a flu shot in a couple      of weeks.  Her colonoscopy, chest x-ray, mammogram are up to date.      Regular gynecological care with Dr. Katrinka Blazing.  She is menopausal, needs to      take calcium and  vitamin D.  Repeat exam in 12 months.  2. Upper respiratory tract infection/cough.  Given Z-Pack 3 days, chest x-      ray in a week if no better.  3. Weight gain.  We can have her see a nutritionist.  Her free T4 is low,      we can try to help out with Armour Thyroid 15 one daily.  She will      discontinue palpitations.  Obtain another set of thyroid tests in six      weeks.  4. Low free T4.  Plan as above.  5. Edema with travel.  Likely related to venous insufficiency/increased      salt intake during travel/dependent position.  Given Lasix 40 mg one to      one-and-a-half daily as needed, and K-Dur 20 daily as needed.  6. Insomnia.  Temazepam 15-30 mg at bedtime  as needed.  7. Tachycardia.  We will have to recheck if relates to her current cold.  8. Orthopedic problems.  Are taken care of by Dr. Otelia Sergeant.  9. Dyslipidemia, on therapy.  10.Gastroesophageal reflux disease, on therapy.  11.History of bladder cancer.  Under regular surveillance by Dr. Vonita Moss.  12.Follow up appointment with me in 2 to 3 months.    ______________________________  Georgina Quint. Plotnikov, MD    AVP/MedQ  DD: 04/06/2006  DT: 04/07/2006  Job #: [id number]   cc:   Maretta Bees. Vonita Moss, M.D.  Bertis Ruddy., M.D.

## 2010-10-29 ENCOUNTER — Other Ambulatory Visit: Payer: Self-pay | Admitting: Dermatology

## 2010-10-30 ENCOUNTER — Other Ambulatory Visit: Payer: Self-pay | Admitting: Gastroenterology

## 2010-10-30 ENCOUNTER — Encounter: Payer: Self-pay | Admitting: Internal Medicine

## 2010-12-10 ENCOUNTER — Other Ambulatory Visit: Payer: Self-pay | Admitting: Dermatology

## 2010-12-23 ENCOUNTER — Other Ambulatory Visit (HOSPITAL_COMMUNITY): Payer: Medicare Other

## 2010-12-23 ENCOUNTER — Ambulatory Visit (HOSPITAL_COMMUNITY)
Admission: RE | Admit: 2010-12-23 | Discharge: 2010-12-23 | Disposition: A | Discharge status: Home / Self Care | Payer: Medicare Other | Source: Ambulatory Visit | Attending: Neurology | Admitting: Neurology

## 2010-12-23 ENCOUNTER — Other Ambulatory Visit (HOSPITAL_COMMUNITY): Payer: Self-pay | Admitting: Neurology

## 2010-12-23 DIAGNOSIS — R413 Other amnesia: Secondary | ICD-10-CM | POA: Insufficient documentation

## 2010-12-23 DIAGNOSIS — H532 Diplopia: Secondary | ICD-10-CM

## 2010-12-23 DIAGNOSIS — I6789 Other cerebrovascular disease: Secondary | ICD-10-CM | POA: Insufficient documentation

## 2010-12-23 DIAGNOSIS — G319 Degenerative disease of nervous system, unspecified: Secondary | ICD-10-CM | POA: Insufficient documentation

## 2010-12-23 DIAGNOSIS — R930 Abnormal findings on diagnostic imaging of skull and head, not elsewhere classified: Secondary | ICD-10-CM | POA: Insufficient documentation

## 2010-12-23 LAB — CREATININE, SERUM: GFR calc non Af Amer: >60 mL/min (ref >60)

## 2010-12-23 MED ORDER — GADOBENATE DIMEGLUMINE 529 MG/ML IV SOLN
20.0000 mL | Freq: Once | INTRAVENOUS | Status: AC | PRN
  Administered 2010-12-23: 20 mL via INTRAVENOUS

## 2011-06-15 DIAGNOSIS — H40009 Preglaucoma, unspecified, unspecified eye: Secondary | ICD-10-CM | POA: Diagnosis not present

## 2011-06-15 DIAGNOSIS — H40059 Ocular hypertension, unspecified eye: Secondary | ICD-10-CM | POA: Diagnosis not present

## 2011-06-17 DIAGNOSIS — L821 Other seborrheic keratosis: Secondary | ICD-10-CM | POA: Diagnosis not present

## 2011-06-17 DIAGNOSIS — D239 Other benign neoplasm of skin, unspecified: Secondary | ICD-10-CM | POA: Diagnosis not present

## 2011-06-17 DIAGNOSIS — D1801 Hemangioma of skin and subcutaneous tissue: Secondary | ICD-10-CM | POA: Diagnosis not present

## 2011-06-18 DIAGNOSIS — M19049 Primary osteoarthritis, unspecified hand: Secondary | ICD-10-CM | POA: Diagnosis not present

## 2011-06-23 ENCOUNTER — Other Ambulatory Visit: Payer: Self-pay | Admitting: Internal Medicine

## 2011-06-26 DIAGNOSIS — M1710 Unilateral primary osteoarthritis, unspecified knee: Secondary | ICD-10-CM | POA: Diagnosis not present

## 2011-06-30 DIAGNOSIS — J320 Chronic maxillary sinusitis: Secondary | ICD-10-CM | POA: Diagnosis not present

## 2011-08-12 ENCOUNTER — Other Ambulatory Visit: Payer: Self-pay | Admitting: Internal Medicine

## 2011-09-17 ENCOUNTER — Other Ambulatory Visit: Payer: Self-pay | Admitting: Internal Medicine

## 2011-09-20 ENCOUNTER — Other Ambulatory Visit: Payer: Self-pay | Admitting: Internal Medicine

## 2011-09-28 ENCOUNTER — Encounter: Payer: Self-pay | Admitting: Internal Medicine

## 2011-09-28 ENCOUNTER — Ambulatory Visit (INDEPENDENT_AMBULATORY_CARE_PROVIDER_SITE_OTHER): Payer: Medicare Other | Admitting: Internal Medicine

## 2011-09-28 ENCOUNTER — Other Ambulatory Visit (INDEPENDENT_AMBULATORY_CARE_PROVIDER_SITE_OTHER): Payer: Medicare Other

## 2011-09-28 VITALS — BP 120/80 | HR 88 | Temp 98.3°F | Resp 16 | Ht 64.0 in | Wt 189.0 lb

## 2011-09-28 DIAGNOSIS — H9319 Tinnitus, unspecified ear: Secondary | ICD-10-CM | POA: Insufficient documentation

## 2011-09-28 DIAGNOSIS — E559 Vitamin D deficiency, unspecified: Secondary | ICD-10-CM

## 2011-09-28 DIAGNOSIS — M545 Low back pain: Secondary | ICD-10-CM | POA: Diagnosis not present

## 2011-09-28 DIAGNOSIS — M8580 Other specified disorders of bone density and structure, unspecified site: Secondary | ICD-10-CM

## 2011-09-28 DIAGNOSIS — E785 Hyperlipidemia, unspecified: Secondary | ICD-10-CM

## 2011-09-28 DIAGNOSIS — R202 Paresthesia of skin: Secondary | ICD-10-CM

## 2011-09-28 DIAGNOSIS — R209 Unspecified disturbances of skin sensation: Secondary | ICD-10-CM | POA: Diagnosis not present

## 2011-09-28 DIAGNOSIS — Z0000 Encounter for general adult medical examination without abnormal findings: Secondary | ICD-10-CM

## 2011-09-28 DIAGNOSIS — Z1239 Encounter for other screening for malignant neoplasm of breast: Secondary | ICD-10-CM

## 2011-09-28 DIAGNOSIS — M25569 Pain in unspecified knee: Secondary | ICD-10-CM | POA: Insufficient documentation

## 2011-09-28 DIAGNOSIS — E040 Nontoxic diffuse goiter: Secondary | ICD-10-CM

## 2011-09-28 LAB — CBC WITH DIFFERENTIAL/PLATELET
Basophils Absolute: 0.1 10*3/uL (ref 0.0–0.1)
HCT: 42.3 % (ref 36.0–46.0)
Lymphs Abs: 1.2 10*3/uL (ref 0.7–4.0)
Monocytes Absolute: 0.5 10*3/uL (ref 0.1–1.0)
Monocytes Relative: 11.1 % (ref 3.0–12.0)
Neutrophils Relative %: 56.5 % (ref 43.0–77.0)
Platelets: 271 10*3/uL (ref 150.0–400.0)
RDW: 13.3 % (ref 11.5–14.6)

## 2011-09-28 LAB — URINALYSIS, ROUTINE W REFLEX MICROSCOPIC
Bilirubin Urine: NEGATIVE
Ketones, ur: NEGATIVE
Specific Gravity, Urine: 1.025 (ref 1.000–1.030)
Urine Glucose: NEGATIVE
Urobilinogen, UA: 0.2 (ref 0.0–1.0)
pH: 6 (ref 5.0–8.0)

## 2011-09-28 LAB — HEPATIC FUNCTION PANEL
Bilirubin, Direct: 0.2 mg/dL (ref 0.0–0.3)
Total Bilirubin: 0.9 mg/dL (ref 0.3–1.2)

## 2011-09-28 LAB — LIPID PANEL
Cholesterol: 160 mg/dL (ref 0–200)
HDL: 68.1 mg/dL (ref >39.00)
LDL Cholesterol: 73 mg/dL (ref 0–99)
Total CHOL/HDL Ratio: 2
Triglycerides: 93 mg/dL (ref 0.0–149.0)

## 2011-09-28 LAB — VITAMIN B12: Vitamin B-12: 912 pg/mL — ABNORMAL HIGH (ref 211–911)

## 2011-09-28 LAB — TSH: TSH: 1.8 u[IU]/mL (ref 0.35–5.50)

## 2011-09-28 LAB — BASIC METABOLIC PANEL
BUN: 20 mg/dL (ref 6–23)
Calcium: 9.6 mg/dL (ref 8.4–10.5)
Creatinine, Ser: 0.8 mg/dL (ref 0.4–1.2)
GFR: 78.14 mL/min (ref >60.00)
Glucose, Bld: 96 mg/dL (ref 70–99)
Potassium: 4.7 mEq/L (ref 3.5–5.1)

## 2011-09-28 MED ORDER — CELECOXIB 200 MG PO CAPS: 200.0000 mg | ORAL_CAPSULE | Freq: Two times a day (BID) | ORAL | Status: DC

## 2011-09-28 MED ORDER — ZOLPIDEM TARTRATE 10 MG PO TABS: 10.0000 mg | ORAL_TABLET | Freq: Every evening | ORAL | Status: DC | PRN

## 2011-09-28 MED ORDER — LEVOTHYROXINE SODIUM 25 MCG PO TABS: 25.0000 ug | ORAL_TABLET | Freq: Every day | ORAL | Status: DC

## 2011-09-28 MED ORDER — GABAPENTIN 100 MG PO CAPS: 100.0000 mg | ORAL_CAPSULE | Freq: Three times a day (TID) | ORAL | Status: DC

## 2011-09-28 MED ORDER — FUROSEMIDE 20 MG PO TABS: 20.0000 mg | ORAL_TABLET | Freq: Every day | ORAL | Status: DC | PRN

## 2011-09-28 MED ORDER — POTASSIUM CHLORIDE ER 8 MEQ PO TBCR: 8.0000 meq | EXTENDED_RELEASE_TABLET | Freq: Every day | ORAL | Status: DC

## 2011-09-28 NOTE — Assessment & Plan Note (Signed)
Continue with current prescription therapy as reflected on the Med list.  

## 2011-09-28 NOTE — Progress Notes (Signed)
Subjective:    Patient ID: Mariah Farrell, female    DOB: March 19, 1941, 71 y.o.   MRN: [id number]  HPI  The patient is here for a wellness exam. The patient has been doing well overall without major physical or psychological issues going on lately. The patient needs to address  chronic hypothyroidism that has been well controlled with medicine C/o B leg swelling started 1 mo ago. C/o ringing in the ears x long time  BP Readings from Last 3 Encounters:  09/28/11 120/80  06/30/10 138/82  04/18/09 134/100   Wt Readings from Last 3 Encounters:  09/28/11 189 lb (85.73 kg)  06/30/10 190 lb (86.183 kg)  04/18/09 201 lb (91.173 kg)      Review of Systems  Constitutional: Negative for fever, chills, diaphoresis, activity change, appetite change, fatigue and unexpected weight change.  HENT: Negative for hearing loss, ear pain, congestion, sore throat, sneezing, mouth sores, neck pain, dental problem, voice change, postnasal drip and sinus pressure.   Eyes: Negative for pain and visual disturbance.  Respiratory: Negative for cough, chest tightness, wheezing and stridor.   Cardiovascular: Negative for chest pain, palpitations and leg swelling.  Gastrointestinal: Negative for nausea, vomiting, abdominal pain, blood in stool, abdominal distention and rectal pain.  Genitourinary: Negative for dysuria, hematuria, decreased urine volume, vaginal bleeding, vaginal discharge, difficulty urinating, vaginal pain and menstrual problem.  Musculoskeletal: Positive for back pain and arthralgias. Negative for joint swelling and gait problem.  Skin: Negative for color change, rash and wound.  Neurological: Positive for numbness. Negative for dizziness, tremors, syncope, speech difficulty and light-headedness.  Hematological: Negative for adenopathy.  Psychiatric/Behavioral: Negative for suicidal ideas, hallucinations, behavioral problems, confusion, sleep disturbance, dysphoric mood and decreased  concentration. The patient is not hyperactive.        Objective:   Physical Exam  Constitutional: She appears well-developed. No distress.       Obese   HENT:  Head: Normocephalic.  Right Ear: External ear normal.  Left Ear: External ear normal.  Nose: Nose normal.  Mouth/Throat: Oropharynx is clear and moist.  Eyes: Conjunctivae are normal. Pupils are equal, round, and reactive to light. Right eye exhibits no discharge. Left eye exhibits no discharge.  Neck: Normal range of motion. Neck supple. No JVD present. No tracheal deviation present. No thyromegaly present.  Cardiovascular: Normal rate, regular rhythm and normal heart sounds.   Pulmonary/Chest: No stridor. No respiratory distress. She has no wheezes.  Abdominal: Soft. Bowel sounds are normal. She exhibits no distension and no mass. There is no tenderness. There is no rebound and no guarding.  Musculoskeletal: She exhibits tenderness (B knees). She exhibits no edema.  Lymphadenopathy:    She has no cervical adenopathy.  Neurological: She displays abnormal reflex. No cranial nerve deficit. She exhibits normal muscle tone. Coordination normal.  Skin: No rash noted. No erythema.  Psychiatric: She has a normal mood and affect. Her behavior is normal. Judgment and thought content normal.     Lab Results  Component Value Date   WBC 6.0 06/30/2010   HGB 14.7 06/30/2010   HCT 42.0 06/30/2010   PLT 286.0 06/30/2010   GLUCOSE 88 06/30/2010   CHOL 193 06/30/2010   TRIG 99.0 06/30/2010   HDL 71.00 06/30/2010   LDLCALC 102* 06/30/2010   ALT 25 06/30/2010   AST 28 06/30/2010   NA 140 06/30/2010   K 4.1 06/30/2010   CL 101 06/30/2010   CREATININE 0.76 12/23/2010   BUN 15 12/23/2010  CO2 31 06/30/2010   TSH 1.67 06/30/2010        Assessment & Plan:

## 2011-09-28 NOTE — Assessment & Plan Note (Signed)

## 2011-09-28 NOTE — Assessment & Plan Note (Signed)
[place]

## 2011-09-29 ENCOUNTER — Telehealth: Payer: Self-pay | Admitting: Internal Medicine

## 2011-09-29 MED ORDER — CIPROFLOXACIN HCL 250 MG PO TABS: 250.0000 mg | ORAL_TABLET | Freq: Two times a day (BID) | ORAL | Status: AC

## 2011-09-29 NOTE — Telephone Encounter (Signed)
Misty Stanley, please, inform patient that all labs are normal except for a bladder infection Take Cipro  Please, mail the labs to the patient. Thx

## 2011-09-30 NOTE — Telephone Encounter (Signed)
Pt informed/ copies mailed.  

## 2011-10-05 ENCOUNTER — Encounter (HOSPITAL_COMMUNITY): Admission: RE | Payer: Self-pay | Source: Ambulatory Visit

## 2011-10-05 ENCOUNTER — Ambulatory Visit (HOSPITAL_COMMUNITY): Admission: RE | Admit: 2011-10-05 | Payer: Medicare Other | Source: Ambulatory Visit | Admitting: Orthopedic Surgery

## 2011-10-05 SURGERY — ARTHROPLASTY, KNEE, TOTAL
Anesthesia: Spinal | Site: Knee | Laterality: Right

## 2011-10-22 DIAGNOSIS — Z1231 Encounter for screening mammogram for malignant neoplasm of breast: Secondary | ICD-10-CM | POA: Diagnosis not present

## 2011-10-22 DIAGNOSIS — Z13820 Encounter for screening for osteoporosis: Secondary | ICD-10-CM | POA: Diagnosis not present

## 2011-10-28 DIAGNOSIS — M1710 Unilateral primary osteoarthritis, unspecified knee: Secondary | ICD-10-CM | POA: Diagnosis not present

## 2011-11-09 DIAGNOSIS — M19049 Primary osteoarthritis, unspecified hand: Secondary | ICD-10-CM | POA: Diagnosis not present

## 2011-11-25 ENCOUNTER — Encounter: Payer: Self-pay | Admitting: Internal Medicine

## 2011-12-09 DIAGNOSIS — Z8551 Personal history of malignant neoplasm of bladder: Secondary | ICD-10-CM | POA: Diagnosis not present

## 2011-12-09 DIAGNOSIS — N362 Urethral caruncle: Secondary | ICD-10-CM | POA: Diagnosis not present

## 2011-12-09 DIAGNOSIS — N952 Postmenopausal atrophic vaginitis: Secondary | ICD-10-CM | POA: Diagnosis not present

## 2011-12-14 DIAGNOSIS — H40019 Open angle with borderline findings, low risk, unspecified eye: Secondary | ICD-10-CM | POA: Diagnosis not present

## 2011-12-14 DIAGNOSIS — H40059 Ocular hypertension, unspecified eye: Secondary | ICD-10-CM | POA: Diagnosis not present

## 2011-12-31 DIAGNOSIS — M1710 Unilateral primary osteoarthritis, unspecified knee: Secondary | ICD-10-CM | POA: Diagnosis not present

## 2011-12-31 DIAGNOSIS — M25559 Pain in unspecified hip: Secondary | ICD-10-CM | POA: Diagnosis not present

## 2011-12-31 DIAGNOSIS — M545 Low back pain: Secondary | ICD-10-CM | POA: Diagnosis not present

## 2012-03-12 DIAGNOSIS — M1710 Unilateral primary osteoarthritis, unspecified knee: Secondary | ICD-10-CM | POA: Diagnosis not present

## 2012-03-14 ENCOUNTER — Other Ambulatory Visit: Payer: Self-pay | Admitting: Physical Medicine and Rehabilitation

## 2012-03-14 DIAGNOSIS — M4310 Spondylolisthesis, site unspecified: Secondary | ICD-10-CM | POA: Diagnosis not present

## 2012-03-14 DIAGNOSIS — IMO0002 Reserved for concepts with insufficient information to code with codable children: Secondary | ICD-10-CM | POA: Diagnosis not present

## 2012-03-14 DIAGNOSIS — K7689 Other specified diseases of liver: Secondary | ICD-10-CM

## 2012-03-16 DIAGNOSIS — M47817 Spondylosis without myelopathy or radiculopathy, lumbosacral region: Secondary | ICD-10-CM | POA: Diagnosis not present

## 2012-03-18 ENCOUNTER — Ambulatory Visit
Admission: RE | Admit: 2012-03-18 | Discharge: 2012-03-18 | Disposition: A | Discharge status: Home / Self Care | Payer: Medicare Other | Source: Ambulatory Visit | Attending: Physical Medicine and Rehabilitation | Admitting: Physical Medicine and Rehabilitation

## 2012-03-18 DIAGNOSIS — K7689 Other specified diseases of liver: Secondary | ICD-10-CM

## 2012-03-23 DIAGNOSIS — Z23 Encounter for immunization: Secondary | ICD-10-CM | POA: Diagnosis not present

## 2012-04-13 DIAGNOSIS — M47817 Spondylosis without myelopathy or radiculopathy, lumbosacral region: Secondary | ICD-10-CM | POA: Diagnosis not present

## 2012-05-18 DIAGNOSIS — M47817 Spondylosis without myelopathy or radiculopathy, lumbosacral region: Secondary | ICD-10-CM | POA: Diagnosis not present

## 2012-06-16 DIAGNOSIS — M1710 Unilateral primary osteoarthritis, unspecified knee: Secondary | ICD-10-CM | POA: Diagnosis not present

## 2012-06-17 ENCOUNTER — Other Ambulatory Visit: Payer: Self-pay | Admitting: Internal Medicine

## 2012-06-21 ENCOUNTER — Encounter (HOSPITAL_COMMUNITY): Payer: Self-pay | Admitting: Pharmacy Technician

## 2012-06-21 DIAGNOSIS — H40059 Ocular hypertension, unspecified eye: Secondary | ICD-10-CM | POA: Diagnosis not present

## 2012-06-21 DIAGNOSIS — H52209 Unspecified astigmatism, unspecified eye: Secondary | ICD-10-CM | POA: Diagnosis not present

## 2012-06-21 DIAGNOSIS — H40019 Open angle with borderline findings, low risk, unspecified eye: Secondary | ICD-10-CM | POA: Diagnosis not present

## 2012-06-24 ENCOUNTER — Encounter (HOSPITAL_COMMUNITY): Payer: Self-pay

## 2012-06-24 ENCOUNTER — Encounter (HOSPITAL_COMMUNITY)
Admission: RE | Admit: 2012-06-24 | Discharge: 2012-06-24 | Disposition: A | Discharge status: Home / Self Care | Payer: Medicare Other | Source: Ambulatory Visit | Attending: Orthopedic Surgery | Admitting: Orthopedic Surgery

## 2012-06-24 DIAGNOSIS — M1710 Unilateral primary osteoarthritis, unspecified knee: Secondary | ICD-10-CM | POA: Insufficient documentation

## 2012-06-24 DIAGNOSIS — Z01812 Encounter for preprocedural laboratory examination: Secondary | ICD-10-CM | POA: Insufficient documentation

## 2012-06-24 HISTORY — DX: Malignant (primary) neoplasm, unspecified: C80.1

## 2012-06-24 HISTORY — DX: Other specified postprocedural states: Z98.890

## 2012-06-24 HISTORY — DX: Other specified postprocedural states: R11.2

## 2012-06-24 HISTORY — DX: Headache: R51

## 2012-06-24 HISTORY — DX: Unspecified osteoarthritis, unspecified site: M19.90

## 2012-06-24 HISTORY — DX: Nontoxic goiter, unspecified: E04.9

## 2012-06-24 HISTORY — DX: Gastro-esophageal reflux disease without esophagitis: K21.9

## 2012-06-24 LAB — CBC
Hemoglobin: 14.1 g/dL (ref 12.0–15.0)
MCH: 32.3 pg (ref 26.0–34.0)
MCV: 97 fL (ref 78.0–100.0)
RBC: 4.37 MIL/uL (ref 3.87–5.11)
WBC: 7 10*3/uL (ref 4.0–10.5)

## 2012-06-24 LAB — URINALYSIS, ROUTINE W REFLEX MICROSCOPIC
Glucose, UA: NEGATIVE mg/dL
Hgb urine dipstick: NEGATIVE
Protein, ur: NEGATIVE mg/dL
Specific Gravity, Urine: 1.026 (ref 1.005–1.030)
Urobilinogen, UA: 0.2 mg/dL (ref 0.0–1.0)

## 2012-06-24 LAB — URINE MICROSCOPIC-ADD ON

## 2012-06-24 LAB — BASIC METABOLIC PANEL
CO2: 29 mEq/L (ref 19–32)
Calcium: 9.4 mg/dL (ref 8.4–10.5)
Creatinine, Ser: 0.83 mg/dL (ref 0.50–1.10)
Glucose, Bld: 77 mg/dL (ref 70–99)

## 2012-06-24 LAB — PROTIME-INR: Prothrombin Time: 12.7 seconds (ref 11.6–15.2)

## 2012-06-24 LAB — APTT: aPTT: 30 seconds (ref 24–37)

## 2012-06-24 NOTE — H&P (Signed)
TOTAL KNEE ADMISSION H&P  Patient is being admitted for right total knee arthroplasty.  Subjective:  Chief Complaint:   Right knee OA / pain.  HPI: Mariah Farrell, 72 y.o. female, has a history of pain and functional disability in the right knee due to arthritis and has failed non-surgical conservative treatments for greater than 12 weeks to includeNSAID's and/or analgesics, corticosteriod injections, viscosupplementation injections and activity modification.  Onset of symptoms was gradual, starting 5 years ago with gradually worsening course since that time. The patient noted no past surgery on the right knee(s).  Patient currently rates pain in the right knee(s) at 9 out of 10 with activity. Patient has worsening of pain with activity and weight bearing, pain that interferes with activities of daily living, pain with passive range of motion, crepitus and joint swelling.  Patient has evidence of periarticular osteophytes and joint space narrowing by imaging studies. There is no active infection.  Risks, benefits and expectations were discussed with the patient. Patient understand the risks, benefits and expectations and wishes to proceed with surgery.   D/C Plans:   Home with HHPT  Post-op Meds:    No Rx given   Tranexamic Acid:   To be given  Decadron:    To be given  FYI:   Nothing to note    Patient Active Problem List   Diagnosis Date Noted  . Tinnitus 09/28/2011  . Well adult exam 09/28/2011  . Knee pain 09/28/2011  . SHOULDER PAIN 06/30/2010  . GOITER, SIMPLE 04/18/2009  . TOBACCO USE, QUIT 04/18/2009  . CHEST PAIN, UNSPECIFIED 04/17/2009  . OSTEOARTHRITIS 04/11/2007  . LOW BACK PAIN 04/11/2007  . PARESTHESIA 04/11/2007  . ABDOMINAL PAIN, RIGHT LOWER QUADRANT 04/11/2007  . COLONIC POLYPS, HX OF 04/11/2007   Past Medical History  Diagnosis Date  . Goiter   . GERD (gastroesophageal reflux disease)   . Headache   . Cancer 8 years ago    bladder  . Arthritis   . PONV  (postoperative nausea and vomiting)     Past Surgical History  Procedure Date  . Rotator cuff repair     bilateral  . Bunionectomy     bilateral  . Bladder surgery   . Tonsillectomy     No prescriptions prior to admission   Allergies  Allergen Reactions  . Esomeprazole Magnesium     palpitations  . Penicillins     History  Substance Use Topics  . Smoking status: Former Smoker -- 0.2 packs/day for 5 years    Types: Cigarettes    Quit date: 06/01/1964  . Smokeless tobacco: Never Used  . Alcohol Use: Yes     Comment: occasional    Family History  Problem Relation Age of Onset  . Cancer Brother 62    pancreatic     Review of Systems  Constitutional: Negative.   HENT: Positive for tinnitus.   Eyes: Negative.   Respiratory: Negative.   Cardiovascular: Negative.   Gastrointestinal: Negative.   Genitourinary: Positive for urgency.  Musculoskeletal: Negative.   Skin: Negative.   Neurological: Positive for headaches.  Endo/Heme/Allergies: Positive for environmental allergies.  Psychiatric/Behavioral: Negative.     Objective:  Physical Exam  Constitutional: She is oriented to person, place, and time. She appears well-developed and well-nourished.  HENT:  Head: Normocephalic and atraumatic.  Mouth/Throat: Oropharynx is clear and moist.  Eyes: Pupils are equal, round, and reactive to light.  Neck: Neck supple. No JVD present. No tracheal deviation present. No  thyromegaly present.  Cardiovascular: Normal rate, regular rhythm, normal heart sounds and intact distal pulses.   Respiratory: Effort normal and breath sounds normal. No stridor. No respiratory distress. She has no wheezes.  GI: Soft. There is no tenderness. There is no guarding.  Musculoskeletal:       Right hip: She exhibits decreased range of motion, decreased strength, tenderness and bony tenderness. She exhibits no swelling, no deformity and no laceration.  Lymphadenopathy:    She has no cervical  adenopathy.  Neurological: She is alert and oriented to person, place, and time.  Skin: Skin is warm and dry.  Psychiatric: She has a normal mood and affect.     Labs:   Estimated Body mass index is 32.44 kg/(m^2) as calculated from the following:   Height as of 09/28/11: 5\' 4" (1.626 m).   Weight as of 09/28/11: 189 lb(85.73 kg).   Imaging Review Plain radiographs demonstrate severe degenerative joint disease of the right knee(s). The overall alignment is neutral. The bone quality appears to be good for age and reported activity level.  Assessment/Plan:  End stage arthritis, right knee   The patient history, physical examination, clinical judgment of the provider and imaging studies are consistent with end stage degenerative joint disease of the right knee(s) and total knee arthroplasty is deemed medically necessary. The treatment options including medical management, injection therapy arthroscopy and arthroplasty were discussed at length. The risks and benefits of total knee arthroplasty were presented and reviewed. The risks due to aseptic loosening, infection, stiffness, patella tracking problems, thromboembolic complications and other imponderables were discussed. The patient acknowledged the explanation, agreed to proceed with the plan and consent was signed. Patient is being admitted for inpatient treatment for surgery, pain control, PT, OT, prophylactic antibiotics, VTE prophylaxis, progressive ambulation and ADL's and discharge planning. The patient is planning to be discharged home with home health services.    Anastasio Auerbach Zali Kamaka   PAC  06/24/2012, 4:21 PM

## 2012-06-24 NOTE — Patient Instructions (Addendum)
[address]  06/24/2012   Your procedure is scheduled on: 07/19/12   Report to [facility] at [phone].  Call this number if you have problems the morning of surgery 336-: [phone]   Remember:   Do not eat food or drink liquids After Midnight.     Take these medicines the morning of surgery with A SIP OF WATER: synthroid, zyrtec, prevacid   Do not wear jewelry, make-up or nail polish.  Do not wear lotions, powders, or perfumes. You may wear deodorant.  Do not shave 48 hours prior to surgery. Men may shave face and neck.  Do not bring valuables to the hospital.  Contacts, dentures or bridgework may not be worn into surgery.  Leave suitcase in the car. After surgery it may be brought to your room.  For patients admitted to the hospital, checkout time is 11:00 AM the day of discharge.    Please read over the following fact sheets that you were given: MRSA Information, blood fact sheet, incentive spirometer fact sheet Birdie Sons, RN  pre op nurse call if needed [phone]    FAILURE TO FOLLOW THESE INSTRUCTIONS MAY RESULT IN CANCELLATION OF YOUR SURGERY   Patient Signature: ___________________________________________

## 2012-07-18 MED ORDER — CLINDAMYCIN PHOSPHATE 900 MG/50ML IV SOLN
900.0000 mg | INTRAVENOUS | Status: AC
  Administered 2012-07-19: 900 mg via INTRAVENOUS
  Filled 2012-07-18: qty 50

## 2012-07-18 NOTE — Progress Notes (Signed)
Surgery time changed to 0830-0940 from 0900-1010. Pt notified and agreed to arrive at 0600 AM

## 2012-07-18 NOTE — Anesthesia Preprocedure Evaluation (Addendum)
Anesthesia Evaluation  Patient identified by MRN, date of birth, ID band Patient awake    Reviewed: Allergy & Precautions, H&P , NPO status , Patient's Chart, lab work & pertinent test results  History of Anesthesia Complications (+) PONV  Airway Mallampati: II TM Distance: >3 FB Neck ROM: Full    Dental  (+) Dental Advisory Given, Teeth Intact and Caps   Pulmonary neg pulmonary ROS,  breath sounds clear to auscultation        Cardiovascular negative cardio ROS  Rhythm:Regular Rate:Normal     Neuro/Psych  Headaches, negative psych ROS   GI/Hepatic Neg liver ROS, GERD-  Medicated,  Endo/Other  negative endocrine ROS  Renal/GU negative Renal ROS     Musculoskeletal  (+) Arthritis -, Osteoarthritis,    Abdominal   Peds  Hematology negative hematology ROS (+)   Anesthesia Other Findings   Reproductive/Obstetrics                          Anesthesia Physical Anesthesia Plan  ASA: II  Anesthesia Plan: Spinal   Post-op Pain Management:    Induction:   Airway Management Planned: Simple Face Mask  Additional Equipment:   Intra-op Plan:   Post-operative Plan:   Informed Consent: I have reviewed the patients History and Physical, chart, labs and discussed the procedure including the risks, benefits and alternatives for the proposed anesthesia with the patient or authorized representative who has indicated his/her understanding and acceptance.   Dental advisory given  Plan Discussed with: CRNA  Anesthesia Plan Comments:        Anesthesia Quick Evaluation

## 2012-07-19 ENCOUNTER — Inpatient Hospital Stay (HOSPITAL_COMMUNITY)
Admission: RE | Admit: 2012-07-19 | Discharge: 2012-07-20 | DRG: 470 | Disposition: A | Discharge status: Home Health | Payer: Medicare Other | Source: Ambulatory Visit | Attending: Orthopedic Surgery | Admitting: Orthopedic Surgery

## 2012-07-19 ENCOUNTER — Inpatient Hospital Stay (HOSPITAL_COMMUNITY): Payer: Medicare Other | Admitting: Anesthesiology

## 2012-07-19 ENCOUNTER — Encounter (HOSPITAL_COMMUNITY)
Admission: RE | Disposition: A | Discharge status: Home Health | Payer: Self-pay | Source: Ambulatory Visit | Attending: Orthopedic Surgery

## 2012-07-19 ENCOUNTER — Encounter (HOSPITAL_COMMUNITY): Payer: Self-pay | Admitting: *Deleted

## 2012-07-19 ENCOUNTER — Encounter (HOSPITAL_COMMUNITY): Payer: Self-pay | Admitting: Anesthesiology

## 2012-07-19 DIAGNOSIS — M1710 Unilateral primary osteoarthritis, unspecified knee: Secondary | ICD-10-CM | POA: Diagnosis not present

## 2012-07-19 DIAGNOSIS — IMO0002 Reserved for concepts with insufficient information to code with codable children: Secondary | ICD-10-CM | POA: Diagnosis not present

## 2012-07-19 DIAGNOSIS — K219 Gastro-esophageal reflux disease without esophagitis: Secondary | ICD-10-CM | POA: Diagnosis present

## 2012-07-19 DIAGNOSIS — D62 Acute posthemorrhagic anemia: Secondary | ICD-10-CM | POA: Diagnosis not present

## 2012-07-19 DIAGNOSIS — E669 Obesity, unspecified: Secondary | ICD-10-CM | POA: Diagnosis present

## 2012-07-19 DIAGNOSIS — Z880 Allergy status to penicillin: Secondary | ICD-10-CM | POA: Diagnosis not present

## 2012-07-19 DIAGNOSIS — E049 Nontoxic goiter, unspecified: Secondary | ICD-10-CM | POA: Diagnosis present

## 2012-07-19 DIAGNOSIS — Z8551 Personal history of malignant neoplasm of bladder: Secondary | ICD-10-CM

## 2012-07-19 DIAGNOSIS — Z6830 Body mass index (BMI) 30.0-30.9, adult: Secondary | ICD-10-CM | POA: Diagnosis not present

## 2012-07-19 DIAGNOSIS — Z96659 Presence of unspecified artificial knee joint: Secondary | ICD-10-CM

## 2012-07-19 HISTORY — PX: TOTAL KNEE ARTHROPLASTY: SHX125

## 2012-07-19 LAB — TYPE AND SCREEN

## 2012-07-19 SURGERY — ARTHROPLASTY, KNEE, TOTAL
Anesthesia: Spinal | Site: Knee | Laterality: Right | Wound class: Clean

## 2012-07-19 MED ORDER — EPHEDRINE SULFATE 50 MG/ML IJ SOLN
INTRAMUSCULAR | Status: DC | PRN
  Administered 2012-07-19 (×2): 5 mg via INTRAVENOUS

## 2012-07-19 MED ORDER — 0.9 % SODIUM CHLORIDE (POUR BTL) OPTIME
TOPICAL | Status: DC | PRN
  Administered 2012-07-19: 1000 mL

## 2012-07-19 MED ORDER — PANTOPRAZOLE SODIUM 20 MG PO TBEC
20.0000 mg | DELAYED_RELEASE_TABLET | Freq: Every day | ORAL | Status: DC
  Administered 2012-07-20: 20 mg via ORAL
  Filled 2012-07-19: qty 1

## 2012-07-19 MED ORDER — LEVOTHYROXINE SODIUM 25 MCG PO TABS
25.0000 ug | ORAL_TABLET | Freq: Every day | ORAL | Status: DC
  Administered 2012-07-20: 25 ug via ORAL
  Filled 2012-07-19 (×2): qty 1

## 2012-07-19 MED ORDER — FENTANYL CITRATE 0.05 MG/ML IJ SOLN
INTRAMUSCULAR | Status: DC | PRN
  Administered 2012-07-19: 50 ug via INTRAVENOUS

## 2012-07-19 MED ORDER — ALUM & MAG HYDROXIDE-SIMETH 200-200-20 MG/5ML PO SUSP: 30.0000 mL | ORAL | Status: DC | PRN

## 2012-07-19 MED ORDER — METHOCARBAMOL 500 MG PO TABS
500.0000 mg | ORAL_TABLET | Freq: Four times a day (QID) | ORAL | Status: DC | PRN
  Administered 2012-07-20 (×3): 500 mg via ORAL
  Filled 2012-07-19 (×3): qty 1

## 2012-07-19 MED ORDER — ACETAMINOPHEN 10 MG/ML IV SOLN: 1000.0000 mg | Freq: Once | INTRAVENOUS | Status: DC | PRN

## 2012-07-19 MED ORDER — METHOCARBAMOL 100 MG/ML IJ SOLN
500.0000 mg | Freq: Four times a day (QID) | INTRAVENOUS | Status: DC | PRN
  Administered 2012-07-19: 500 mg via INTRAVENOUS
  Filled 2012-07-19: qty 5

## 2012-07-19 MED ORDER — CLINDAMYCIN PHOSPHATE 600 MG/50ML IV SOLN
600.0000 mg | Freq: Four times a day (QID) | INTRAVENOUS | Status: AC
  Administered 2012-07-19 (×2): 600 mg via INTRAVENOUS
  Filled 2012-07-19 (×2): qty 50

## 2012-07-19 MED ORDER — BUPIVACAINE HCL (PF) 0.75 % IJ SOLN
INTRAMUSCULAR | Status: DC | PRN
  Administered 2012-07-19: 15 mg

## 2012-07-19 MED ORDER — ONDANSETRON HCL 4 MG/2ML IJ SOLN
4.0000 mg | Freq: Four times a day (QID) | INTRAMUSCULAR | Status: DC | PRN
  Administered 2012-07-20: 4 mg via INTRAVENOUS
  Filled 2012-07-19: qty 2

## 2012-07-19 MED ORDER — RIVAROXABAN 10 MG PO TABS
10.0000 mg | ORAL_TABLET | ORAL | Status: DC
  Administered 2012-07-20: 10 mg via ORAL
  Filled 2012-07-19 (×2): qty 1

## 2012-07-19 MED ORDER — SODIUM CHLORIDE 0.9 % IV SOLN
INTRAVENOUS | Status: DC
  Administered 2012-07-19 – 2012-07-20 (×2): via INTRAVENOUS
  Filled 2012-07-19 (×9): qty 1000

## 2012-07-19 MED ORDER — PROPOFOL 10 MG/ML IV EMUL
INTRAVENOUS | Status: DC | PRN
  Administered 2012-07-19: 50 ug/kg/min via INTRAVENOUS

## 2012-07-19 MED ORDER — MIDAZOLAM HCL 5 MG/5ML IJ SOLN
INTRAMUSCULAR | Status: DC | PRN
  Administered 2012-07-19: 2 mg via INTRAVENOUS

## 2012-07-19 MED ORDER — PROMETHAZINE HCL 25 MG/ML IJ SOLN: 6.2500 mg | INTRAMUSCULAR | Status: DC | PRN

## 2012-07-19 MED ORDER — CELECOXIB 200 MG PO CAPS
200.0000 mg | ORAL_CAPSULE | Freq: Two times a day (BID) | ORAL | Status: DC
  Administered 2012-07-19 – 2012-07-20 (×3): 200 mg via ORAL
  Filled 2012-07-19 (×4): qty 1

## 2012-07-19 MED ORDER — FLEET ENEMA 7-19 GM/118ML RE ENEM: 1.0000 | ENEMA | Freq: Once | RECTAL | Status: AC | PRN

## 2012-07-19 MED ORDER — OXYCODONE HCL 5 MG PO TABS: 5.0000 mg | ORAL_TABLET | Freq: Once | ORAL | Status: DC | PRN

## 2012-07-19 MED ORDER — OXYCODONE HCL 5 MG/5ML PO SOLN
5.0000 mg | Freq: Once | ORAL | Status: DC | PRN
  Filled 2012-07-19: qty 5

## 2012-07-19 MED ORDER — ONDANSETRON HCL 4 MG PO TABS: 4.0000 mg | ORAL_TABLET | Freq: Four times a day (QID) | ORAL | Status: DC | PRN

## 2012-07-19 MED ORDER — BACID PO TABS
1.0000 | ORAL_TABLET | Freq: Every day | ORAL | Status: DC
  Filled 2012-07-19 (×2): qty 1

## 2012-07-19 MED ORDER — SALINE FLUSH 0.9 % IV SOLN
INTRAVENOUS | Status: DC | PRN
  Administered 2012-07-19: 50 mL

## 2012-07-19 MED ORDER — MAGNESIUM OXIDE 400 MG PO TABS
400.0000 mg | ORAL_TABLET | Freq: Every day | ORAL | Status: DC
  Administered 2012-07-20: 400 mg via ORAL
  Filled 2012-07-19 (×2): qty 1

## 2012-07-19 MED ORDER — HYDROMORPHONE HCL PF 1 MG/ML IJ SOLN
0.5000 mg | INTRAMUSCULAR | Status: DC | PRN
  Administered 2012-07-19: 1 mg via INTRAVENOUS
  Administered 2012-07-19: 0.5 mg via INTRAVENOUS
  Filled 2012-07-19 (×2): qty 1

## 2012-07-19 MED ORDER — POLYETHYLENE GLYCOL 3350 17 G PO PACK
17.0000 g | PACK | Freq: Two times a day (BID) | ORAL | Status: DC
  Administered 2012-07-19 – 2012-07-20 (×2): 17 g via ORAL

## 2012-07-19 MED ORDER — DOCUSATE SODIUM 100 MG PO CAPS
100.0000 mg | ORAL_CAPSULE | Freq: Two times a day (BID) | ORAL | Status: DC
  Administered 2012-07-19 – 2012-07-20 (×2): 100 mg via ORAL

## 2012-07-19 MED ORDER — LORATADINE 10 MG PO TABS
10.0000 mg | ORAL_TABLET | Freq: Every day | ORAL | Status: DC
  Administered 2012-07-20: 10 mg via ORAL
  Filled 2012-07-19: qty 1

## 2012-07-19 MED ORDER — MENTHOL 3 MG MT LOZG: 1.0000 | LOZENGE | OROMUCOSAL | Status: DC | PRN

## 2012-07-19 MED ORDER — SODIUM CHLORIDE 0.9 % IR SOLN
Status: DC | PRN
  Administered 2012-07-19: 1000 mL

## 2012-07-19 MED ORDER — LACTATED RINGERS IV SOLN
INTRAVENOUS | Status: DC | PRN
  Administered 2012-07-19 (×3): via INTRAVENOUS

## 2012-07-19 MED ORDER — GABAPENTIN 300 MG PO CAPS
300.0000 mg | ORAL_CAPSULE | Freq: Every evening | ORAL | Status: DC | PRN
  Filled 2012-07-19: qty 1

## 2012-07-19 MED ORDER — DEXAMETHASONE SODIUM PHOSPHATE 10 MG/ML IJ SOLN
10.0000 mg | Freq: Once | INTRAMUSCULAR | Status: AC
  Administered 2012-07-19: 10 mg via INTRAVENOUS

## 2012-07-19 MED ORDER — DIPHENHYDRAMINE HCL 25 MG PO CAPS: 25.0000 mg | ORAL_CAPSULE | Freq: Four times a day (QID) | ORAL | Status: DC | PRN

## 2012-07-19 MED ORDER — FERROUS SULFATE 325 (65 FE) MG PO TABS
325.0000 mg | ORAL_TABLET | Freq: Three times a day (TID) | ORAL | Status: DC
  Administered 2012-07-20: 325 mg via ORAL
  Filled 2012-07-19 (×6): qty 1

## 2012-07-19 MED ORDER — PHENOL 1.4 % MT LIQD: 1.0000 | OROMUCOSAL | Status: DC | PRN

## 2012-07-19 MED ORDER — BISACODYL 10 MG RE SUPP: 10.0000 mg | Freq: Every day | RECTAL | Status: DC | PRN

## 2012-07-19 MED ORDER — MEPERIDINE HCL 50 MG/ML IJ SOLN: 6.2500 mg | INTRAMUSCULAR | Status: DC | PRN

## 2012-07-19 MED ORDER — ACETAMINOPHEN 10 MG/ML IV SOLN
INTRAVENOUS | Status: DC | PRN
  Administered 2012-07-19: 1000 mg via INTRAVENOUS

## 2012-07-19 MED ORDER — HYDROMORPHONE HCL PF 1 MG/ML IJ SOLN: 0.2500 mg | INTRAMUSCULAR | Status: DC | PRN

## 2012-07-19 MED ORDER — TRANEXAMIC ACID 100 MG/ML IV SOLN
1000.0000 mg | Freq: Once | INTRAVENOUS | Status: AC
  Administered 2012-07-19: 1000 mg via INTRAVENOUS
  Filled 2012-07-19: qty 10

## 2012-07-19 MED ORDER — HYDROMORPHONE HCL 2 MG PO TABS
2.0000 mg | ORAL_TABLET | ORAL | Status: DC
  Administered 2012-07-19: 2 mg via ORAL
  Administered 2012-07-19 – 2012-07-20 (×2): 4 mg via ORAL
  Administered 2012-07-20 (×2): 2 mg via ORAL
  Filled 2012-07-19 (×2): qty 1
  Filled 2012-07-19: qty 2
  Filled 2012-07-19: qty 1
  Filled 2012-07-19: qty 2

## 2012-07-19 MED ORDER — ZOLPIDEM TARTRATE 5 MG PO TABS
5.0000 mg | ORAL_TABLET | Freq: Every evening | ORAL | Status: DC | PRN
  Administered 2012-07-20: 5 mg via ORAL
  Filled 2012-07-19: qty 1

## 2012-07-19 MED ORDER — ONDANSETRON HCL 4 MG/2ML IJ SOLN
INTRAMUSCULAR | Status: DC | PRN
  Administered 2012-07-19: 4 mg via INTRAVENOUS

## 2012-07-19 MED ORDER — DEXAMETHASONE SODIUM PHOSPHATE 10 MG/ML IJ SOLN
10.0000 mg | Freq: Once | INTRAMUSCULAR | Status: AC
  Administered 2012-07-20: 10 mg via INTRAVENOUS
  Filled 2012-07-19: qty 1

## 2012-07-19 MED ORDER — BUPIVACAINE LIPOSOME 1.3 % IJ SUSP
20.0000 mL | Freq: Once | INTRAMUSCULAR | Status: AC
  Administered 2012-07-19: 20 mL
  Filled 2012-07-19: qty 20

## 2012-07-19 SURGICAL SUPPLY — 57 items
BAG ZIPLOCK 12X15 (MISCELLANEOUS) ×2 IMPLANT
BANDAGE ELASTIC 6 VELCRO ST LF (GAUZE/BANDAGES/DRESSINGS) ×2 IMPLANT
BANDAGE ESMARK 6X9 LF (GAUZE/BANDAGES/DRESSINGS) ×1 IMPLANT
BLADE SAW SGTL 13.0X1.19X90.0M (BLADE) ×2 IMPLANT
BNDG ESMARK 6X9 LF (GAUZE/BANDAGES/DRESSINGS) ×2
BOWL SMART MIX CTS (DISPOSABLE) ×2 IMPLANT
CEMENT HV SMART SET (Cement) ×4 IMPLANT
CHLORAPREP W/TINT 26ML (MISCELLANEOUS) ×2 IMPLANT
CLOTH BEACON ORANGE TIMEOUT ST (SAFETY) ×2 IMPLANT
CUFF TOURN SGL QUICK 34 (TOURNIQUET CUFF) ×1
CUFF TRNQT CYL 34X4X40X1 (TOURNIQUET CUFF) ×1 IMPLANT
DECANTER SPIKE VIAL GLASS SM (MISCELLANEOUS) ×2 IMPLANT
DERMABOND ADVANCED (GAUZE/BANDAGES/DRESSINGS) ×1
DERMABOND ADVANCED .7 DNX12 (GAUZE/BANDAGES/DRESSINGS) ×1 IMPLANT
DRAPE EXTREMITY T 121X128X90 (DRAPE) ×2 IMPLANT
DRAPE INCISE 23X17 IOBAN STRL (DRAPES) ×1
DRAPE INCISE IOBAN 23X17 STRL (DRAPES) ×1 IMPLANT
DRAPE POUCH INSTRU U-SHP 10X18 (DRAPES) ×2 IMPLANT
DRAPE U-SHAPE 47X51 STRL (DRAPES) ×2 IMPLANT
DRSG AQUACEL AG ADV 3.5X10 (GAUZE/BANDAGES/DRESSINGS) ×2 IMPLANT
DRSG TEGADERM 4X4.75 (GAUZE/BANDAGES/DRESSINGS) ×2 IMPLANT
ELECT REM PT RETURN 9FT ADLT (ELECTROSURGICAL) ×2
ELECTRODE REM PT RTRN 9FT ADLT (ELECTROSURGICAL) ×1 IMPLANT
EVACUATOR 1/8 PVC DRAIN (DRAIN) ×2 IMPLANT
FACESHIELD LNG OPTICON STERILE (SAFETY) ×10 IMPLANT
GAUZE SPONGE 2X2 8PLY STRL LF (GAUZE/BANDAGES/DRESSINGS) ×1 IMPLANT
GLOVE BIOGEL PI IND STRL 7.5 (GLOVE) ×1 IMPLANT
GLOVE BIOGEL PI IND STRL 8 (GLOVE) ×1 IMPLANT
GLOVE BIOGEL PI INDICATOR 7.5 (GLOVE) ×1
GLOVE BIOGEL PI INDICATOR 8 (GLOVE) ×1
GLOVE ECLIPSE 8.0 STRL XLNG CF (GLOVE) ×2 IMPLANT
GLOVE ORTHO TXT STRL SZ7.5 (GLOVE) ×4 IMPLANT
GOWN BRE IMP PREV XXLGXLNG (GOWN DISPOSABLE) ×4 IMPLANT
GOWN STRL NON-REIN LRG LVL3 (GOWN DISPOSABLE) ×2 IMPLANT
HANDPIECE INTERPULSE COAX TIP (DISPOSABLE) ×1
IMMOBILIZER KNEE 20 (SOFTGOODS)
IMMOBILIZER KNEE 20 THIGH 36 (SOFTGOODS) IMPLANT
KIT BASIN OR (CUSTOM PROCEDURE TRAY) ×2 IMPLANT
MANIFOLD NEPTUNE II (INSTRUMENTS) ×2 IMPLANT
NDL SAFETY ECLIPSE 18X1.5 (NEEDLE) ×1 IMPLANT
NEEDLE HYPO 18GX1.5 SHARP (NEEDLE) ×1
NS IRRIG 1000ML POUR BTL (IV SOLUTION) ×2 IMPLANT
PACK TOTAL JOINT (CUSTOM PROCEDURE TRAY) ×2 IMPLANT
POSITIONER SURGICAL ARM (MISCELLANEOUS) ×2 IMPLANT
SET HNDPC FAN SPRY TIP SCT (DISPOSABLE) ×1 IMPLANT
SET PAD KNEE POSITIONER (MISCELLANEOUS) ×2 IMPLANT
SPONGE GAUZE 2X2 STER 10/PKG (GAUZE/BANDAGES/DRESSINGS) ×1
SUCTION FRAZIER 12FR DISP (SUCTIONS) ×2 IMPLANT
SUT MNCRL AB 4-0 PS2 18 (SUTURE) ×2 IMPLANT
SUT VIC AB 1 CT1 36 (SUTURE) ×6 IMPLANT
SUT VIC AB 2-0 CT1 27 (SUTURE) ×3
SUT VIC AB 2-0 CT1 TAPERPNT 27 (SUTURE) ×3 IMPLANT
SYR 50ML LL SCALE MARK (SYRINGE) ×2 IMPLANT
TOWEL OR 17X26 10 PK STRL BLUE (TOWEL DISPOSABLE) ×4 IMPLANT
TRAY FOLEY CATH 14FRSI W/METER (CATHETERS) ×2 IMPLANT
WATER STERILE IRR 1500ML POUR (IV SOLUTION) ×4 IMPLANT
WRAP KNEE MAXI GEL POST OP (GAUZE/BANDAGES/DRESSINGS) ×2 IMPLANT

## 2012-07-19 NOTE — Anesthesia Postprocedure Evaluation (Signed)
Anesthesia Post Note  Patient: Mariah Farrell  Procedure(s) Performed: Procedure(s) (LRB): TOTAL KNEE ARTHROPLASTY (Right)  Anesthesia type: Spinal  Patient location: PACU  Post pain: Pain level controlled  Post assessment: Post-op Vital signs reviewed  Last Vitals: BP 134/71  Pulse 70  Temp(Src) 36.4 C (Oral)  Resp 16  Ht 5\' 2"  (1.575 m)  Wt 165 lb (74.844 kg)  BMI 30.17 kg/m2  SpO2 100%  Post vital signs: Reviewed  Level of consciousness: sedated  Complications: No apparent anesthesia complications

## 2012-07-19 NOTE — Transfer of Care (Signed)
Immediate Anesthesia Transfer of Care Note  Patient: Mariah Farrell  Procedure(s) Performed: Procedure(s): TOTAL KNEE ARTHROPLASTY (Right)  Patient Location: PACU  Anesthesia Type:Spinal  Level of Consciousness: awake  Airway & Oxygen Therapy: Patient Spontanous Breathing and Patient connected to face mask oxygen  Post-op Assessment: Report given to PACU RN and Post -op Vital signs reviewed and stable  Post vital signs: Reviewed and stable  Complications: No apparent anesthesia complications

## 2012-07-19 NOTE — Evaluation (Signed)
Physical Therapy Evaluation Patient Details Name: Mariah Farrell MRN: [id number] DOB: 08-05-1940 Today's Date: 07/19/2012 Time: 4098-1191 PT Time Calculation (min): 35 min  PT Assessment / Plan / Recommendation Clinical Impression  Pt s/p R TKR presents with decreased R LE strength/ROM and post op pain limiting functional mobility    PT Assessment  Patient needs continued PT services    Follow Up Recommendations  Home health PT    Does the patient have the potential to tolerate intense rehabilitation      Barriers to Discharge None      Equipment Recommendations  None recommended by PT    Recommendations for Other Services OT consult   Frequency 7X/week    Precautions / Restrictions Precautions Precautions: Knee;Fall Restrictions Weight Bearing Restrictions: No Other Position/Activity Restrictions: WBAT   Pertinent Vitals/Pain 5/10 with activity; premed, ice packs provided      Mobility  Bed Mobility Bed Mobility: Supine to Sit Supine to Sit: 4: Min assist Details for Bed Mobility Assistance: min cues for sequence and use of L LE to self assist Transfers Transfers: Sit to Stand;Stand to Sit Sit to Stand: 4: Min assist Stand to Sit: 4: Min assist Details for Transfer Assistance: cues for LE managment and use of UEs to self assist Ambulation/Gait Ambulation/Gait Assistance: 4: Min assist Ambulation Distance (Feet): 29 Feet Assistive device: Rolling walker Ambulation/Gait Assistance Details: cues for sequence. posture, and position from RW Gait Pattern: Step-to pattern Stairs: No    Exercises Total Joint Exercises Ankle Circles/Pumps: AROM;Both;10 reps;Supine Quad Sets: AROM;Both;10 reps;Supine Heel Slides: AAROM;10 reps;Supine;Right Straight Leg Raises: AAROM;Right;10 reps;Supine;AROM   PT Diagnosis: Difficulty walking  PT Problem List: Decreased strength;Decreased range of motion;Decreased activity tolerance;Decreased mobility;Decreased knowledge of use  of DME;Pain PT Treatment Interventions: DME instruction;Gait training;Stair training;Functional mobility training;Therapeutic activities;Therapeutic exercise;Patient/family education   PT Goals Acute Rehab PT Goals PT Goal Formulation: With patient Time For Goal Achievement: 07/27/12 Potential to Achieve Goals: Good Pt will go Supine/Side to Sit: with supervision PT Goal: Supine/Side to Sit - Progress: Goal set today Pt will go Sit to Supine/Side: with supervision PT Goal: Sit to Supine/Side - Progress: Goal set today Pt will go Sit to Stand: with supervision PT Goal: Sit to Stand - Progress: Goal set today Pt will go Stand to Sit: with supervision PT Goal: Stand to Sit - Progress: Goal set today Pt will Ambulate: 51 - 150 feet;with supervision;with rolling walker PT Goal: Ambulate - Progress: Goal set today Pt will Go Up / Down Stairs: 1-2 stairs;with min assist;with least restrictive assistive device PT Goal: Up/Down Stairs - Progress: Goal set today  Visit Information  Last PT Received On: 07/19/12 Assistance Needed: +1    Subjective Data  Subjective: I was out golfing right before surgery Patient Stated Goal: Resume previous active lifestyle with decreased pain   Prior Functioning  Home Living Lives With: Spouse Available Help at Discharge: Family Type of Home: House Home Access: Stairs to enter Secretary/administrator of Steps: 2 Entrance Stairs-Rails: None Home Layout: One level Home Adaptive Equipment: Walker - rolling Prior Function Level of Independence: Independent Able to Take Stairs?: Yes Driving: Yes Vocation: Retired Musician: No difficulties    Copywriter, advertising Overall Cognitive Status: Appears within functional limits for tasks assessed/performed Arousal/Alertness: Awake/alert Orientation Level: Appears intact for tasks assessed Behavior During Session: [facility] for tasks performed    Extremity/Trunk Assessment Right Upper  Extremity Assessment RUE ROM/Strength/Tone: [facility] for tasks assessed Left Upper Extremity Assessment LUE ROM/Strength/Tone: [facility]  for tasks assessed Right Lower Extremity Assessment RLE ROM/Strength/Tone: Deficits RLE ROM/Strength/Tone Deficits: 3/5 quads with pt performing IND SLR; AAROm at knee -10 - 50 Left Lower Extremity Assessment LLE ROM/Strength/Tone: WFL for tasks assessed   Balance    End of Session PT - End of Session Equipment Utilized During Treatment: Gait belt Activity Tolerance: Patient tolerated treatment well Patient left: in chair;with call bell/phone within reach;with family/visitor present Nurse Communication: Mobility status  GP     Katelyne Galster 07/19/2012, 5:44 PM

## 2012-07-19 NOTE — Interval H&P Note (Signed)
History and Physical Interval Note:  07/19/2012 6:47 AM  Mariah Farrell  has presented today for surgery, with the diagnosis of right knee oa  The various methods of treatment have been discussed with the patient and family. After consideration of risks, benefits and other options for treatment, the patient has consented to  Procedure(s): TOTAL KNEE ARTHROPLASTY (Right) as a surgical intervention .  The patient's history has been reviewed, patient examined, no change in status, stable for surgery.  I have reviewed the patient's chart and labs.  Questions were answered to the patient's satisfaction.     Shelda Pal

## 2012-07-19 NOTE — Progress Notes (Signed)
Utilization review completed.  

## 2012-07-19 NOTE — Op Note (Signed)
NAME:  JAYMEE TILSON                      MEDICAL RECORD NO.:  [id number]                             FACILITY:  [facility]      PHYSICIAN:  Madlyn Frankel. Charlann Boxer, M.D.  DATE OF BIRTH:  [birth year]      DATE OF PROCEDURE:  07/19/2012                                     OPERATIVE REPORT         PREOPERATIVE DIAGNOSIS:  Right knee osteoarthritis.      POSTOPERATIVE DIAGNOSIS:  Right knee osteoarthritis.      FINDINGS:  The patient was noted to have complete loss of cartilage and   bone-on-bone arthritis with associated osteophytes in the medial and patellofemoral compartments of   the knee with a significant synovitis and associated effusion.      PROCEDURE:  Right total knee replacement.      COMPONENTS USED:  DePuy rotating platform posterior stabilized knee   system, a size 3 femur, 2.5 tibia, 10 mm PS insert, and 38 patellar   button.      SURGEON:  Madlyn Frankel. Charlann Boxer, M.D.      ASSISTANT:  Lanney Gins, PA-C.      ANESTHESIA:  Spinal.      SPECIMENS:  None.      COMPLICATION:  None.      DRAINS:  One Hemovac.  EBL: <100cc      TOURNIQUET TIME:   Total Tourniquet Time Documented: Thigh (Right) - 33 minutes Total: Thigh (Right) - 33 minutes  .      The patient was stable to the recovery room.      INDICATION FOR PROCEDURE:  KAMIRAH SHUGRUE is a 72 y.o. female patient of   mine.  The patient had been seen, evaluated, and treated conservatively in the   office with medication, activity modification, and injections.  The patient had   radiographic changes of bone-on-bone arthritis with endplate sclerosis and osteophytes noted.      The patient failed conservative measures including medication, injections, and activity modification, and at this point was ready for more definitive measures.   Based on the radiographic changes and failed conservative measures, the patient   decided to proceed with total knee replacement.  Risks of infection,   DVT, component failure, need  for revision surgery, postop course, and   expectations were all   discussed and reviewed.  Consent was obtained for benefit of pain   relief.      PROCEDURE IN DETAIL:  The patient was brought to the operative theater.   Once adequate anesthesia, preoperative antibiotics, 900mg  of Clindamycin administered, the patient was positioned supine with the right thigh tourniquet placed.  The  right lower extremity was prepped and draped in sterile fashion.  A time-   out was performed identifying the patient, planned procedure, and   extremity.      The right lower extremity was placed in the [facility] leg holder.  The leg was   exsanguinated, tourniquet elevated to 250 mmHg.  A midline incision was   made followed by median parapatellar arthrotomy.  Following initial   exposure, attention was first  directed to the patella.  Precut   measurement was noted to be 21 mm.  I resected down to 13 mm and used a   38 patellar button to restore patellar height as well as cover the cut   surface.      The lug holes were drilled and a metal shim was placed to protect the   patella from retractors and saw blades.      At this point, attention was now directed to the femur.  The femoral   canal was opened with a drill, irrigated to try to prevent fat emboli.  An   intramedullary rod was passed at 3 degrees valgus, 10 mm of bone was   resected off the distal femur.  Following this resection, the tibia was   subluxated anteriorly.  Using the extramedullary guide, 10 mm of bone was resected off   the proximal lateral tibia.  We confirmed the gap would be   stable medially and laterally with a 10 mm insert as well as confirmed   the cut was perpendicular in the coronal plane, checking with an alignment rod.      Once this was done, I sized the femur to be a size 3 in the anterior-   posterior dimension, chose a standard component based on medial and   lateral dimension.  The size 3 rotation block was then  pinned in   position anterior referenced using the C-clamp to set rotation.  The   anterior, posterior, and  chamfer cuts were made without difficulty nor   notching making certain that I was along the anterior cortex to help   with flexion gap stability.      The final box cut was made off the lateral aspect of distal femur.      At this point, the tibia was sized to be a size 2.5, the size 2.5 tray was   then pinned in position through the medial third of the tubercle,   drilled, and keel punched.  Trial reduction was now carried with a 3 femur,  2.5 tibia, a 10 mm insert, and the 38 patella botton.  The knee was brought to   extension, full extension with good flexion stability with the patella   tracking through the trochlea without application of pressure.  Given   all these findings, the trial components removed.  Final components were   opened and cement was mixed.  The knee was irrigated with normal saline   solution and pulse lavage.  The synovial lining was   then injected with 0.25% Marcaine with epinephrine and 1 cc of Toradol,   total of 61 cc.      The knee was irrigated.  Final implants were then cemented onto clean and   dried cut surfaces of bone with the knee brought to extension with a 10 mm trial insert.      Once the cement had fully cured, the excess cement was removed   throughout the knee.  I confirmed I was satisfied with the range of   motion and stability, and the final 10 mm PS insert was chosen.  It was   placed into the knee.      The tourniquet had been let down at 32 minutes.  No significant   hemostasis required.  The medium Hemovac drain was placed deep.  The   extensor mechanism was then reapproximated using #1 Vicryl with the knee   in flexion.  The  remaining wound was closed with 2-0 Vicryl and running 4-0 Monocryl.   The knee was cleaned, dried, dressed sterilely using Dermabond and   Aquacel dressing.  Drain site dressed separately.  The  patient was then   brought to recovery room in stable condition, tolerating the procedure   well.   Please note that Physician Assistant, Lanney Gins, was present for the entirety of the case, and was utilized for pre-operative positioning, peri-operative retractor management, general facilitation of the procedure.  He was also utilized for primary wound closure at the end of the case.              Madlyn Frankel Charlann Boxer, M.D.

## 2012-07-19 NOTE — Anesthesia Procedure Notes (Signed)
Spinal  Patient location during procedure: OR Start time: 07/19/2012 8:30 AM End time: 07/19/2012 8:33 AM Staffing Anesthesiologist: Gaylan Gerold CRNA/Resident: Carmelia Roller R Preanesthetic Checklist Completed: patient identified, site marked, surgical consent, pre-op evaluation, timeout performed, IV checked, risks and benefits discussed and monitors and equipment checked Spinal Block Patient position: sitting Prep: Betadine Patient monitoring: heart rate Approach: midline Location: L3-4 Injection technique: single-shot Needle Needle gauge: 22 G Needle length: 9 cm Needle insertion depth: 7 cm

## 2012-07-20 ENCOUNTER — Encounter (HOSPITAL_COMMUNITY): Payer: Self-pay | Admitting: Orthopedic Surgery

## 2012-07-20 DIAGNOSIS — E669 Obesity, unspecified: Secondary | ICD-10-CM | POA: Diagnosis present

## 2012-07-20 LAB — BASIC METABOLIC PANEL
BUN: 14 mg/dL (ref 6–23)
Calcium: 8.2 mg/dL — ABNORMAL LOW (ref 8.4–10.5)
GFR calc Af Amer: >90 mL/min (ref >90)
GFR calc non Af Amer: 85 mL/min — ABNORMAL LOW (ref >90)
Potassium: 3.7 mEq/L (ref 3.5–5.1)
Sodium: 140 mEq/L (ref 135–145)

## 2012-07-20 LAB — CBC
HCT: 34.1 % — ABNORMAL LOW (ref 36.0–46.0)
MCH: 33 pg (ref 26.0–34.0)
MCHC: 34 g/dL (ref 30.0–36.0)
RDW: 12.9 % (ref 11.5–15.5)

## 2012-07-20 MED ORDER — DIPHENHYDRAMINE HCL 25 MG PO CAPS: 25.0000 mg | ORAL_CAPSULE | Freq: Four times a day (QID) | ORAL | Status: DC | PRN

## 2012-07-20 MED ORDER — HYDROMORPHONE HCL 2 MG PO TABS
2.0000 mg | ORAL_TABLET | ORAL | Status: DC
  Administered 2012-07-20 (×3): 4 mg via ORAL
  Filled 2012-07-20 (×3): qty 2

## 2012-07-20 MED ORDER — HYDROMORPHONE HCL 2 MG PO TABS: 2.0000 mg | ORAL_TABLET | ORAL | Status: DC | PRN

## 2012-07-20 MED ORDER — ACETAMINOPHEN 500 MG PO TABS: 1000.0000 mg | ORAL_TABLET | Freq: Three times a day (TID) | ORAL | Status: DC

## 2012-07-20 MED ORDER — DSS 100 MG PO CAPS: 100.0000 mg | ORAL_CAPSULE | Freq: Two times a day (BID) | ORAL | Status: DC

## 2012-07-20 MED ORDER — ASPIRIN EC 325 MG PO TBEC: 325.0000 mg | DELAYED_RELEASE_TABLET | Freq: Two times a day (BID) | ORAL | Status: DC

## 2012-07-20 MED ORDER — LACTINEX PO CHEW
1.0000 | CHEWABLE_TABLET | Freq: Every day | ORAL | Status: DC
  Administered 2012-07-20: 1 via ORAL
  Filled 2012-07-20: qty 1

## 2012-07-20 MED ORDER — TIZANIDINE HCL 4 MG PO CAPS: 4.0000 mg | ORAL_CAPSULE | Freq: Three times a day (TID) | ORAL | Status: DC

## 2012-07-20 MED ORDER — ACETAMINOPHEN 500 MG PO TABS
1000.0000 mg | ORAL_TABLET | Freq: Three times a day (TID) | ORAL | Status: DC
  Administered 2012-07-20: 1000 mg via ORAL
  Filled 2012-07-20: qty 2

## 2012-07-20 MED ORDER — FERROUS SULFATE 325 (65 FE) MG PO TABS: 325.0000 mg | ORAL_TABLET | Freq: Three times a day (TID) | ORAL | Status: DC

## 2012-07-20 MED ORDER — POLYETHYLENE GLYCOL 3350 17 G PO PACK: 17.0000 g | PACK | Freq: Two times a day (BID) | ORAL | Status: DC

## 2012-07-20 NOTE — Care Management Note (Signed)
  Page 2 of 2   07/20/2012     12:46:19 PM   CARE MANAGEMENT NOTE 07/20/2012  Patient:  Mariah Farrell, Mariah Farrell   Account Number:  [id number]  Date Initiated:  07/20/2012  Documentation initiated by:  Colleen Can  Subjective/Objective Assessment:   dx Osteoarthritis knee-rt; total knee replacemnt.    Pre-arranged with Genevieve Norlander for [facility] services upon discharge.     Action/Plan:   CM spoke with patient. Plans are for her to return to her home where she will have caregiver. States she already has RW and commode seat. Requested [facility] agency that office referred her to.   Anticipated DC Date:  07/21/2012   Anticipated DC Plan:  HOME W HOME HEALTH SERVICES  In-house referral  NA      DC Planning Services  CM consult      [facility] Choice  HOME HEALTH   Choice offered to / List presented to:  C-1 Patient        HH arranged  HH-2 PT      [facility] agency  [facility]   Status of service:  Completed, signed off Medicare Important Message given?  NA - LOS <3 / Initial given by admissions (If response is "NO", the following Medicare IM given date fields will be blank) Date Medicare IM given:   Date Additional Medicare IM given:    Discharge Disposition:    Per UR Regulation:    If discussed at Long Length of Stay Meetings, dates discussed:    Comments:  07/20/2012 Colleen Can BSN RN CCM [phone] [facility] Home health will provide [facility] services to pt day after discharge.

## 2012-07-20 NOTE — Progress Notes (Signed)
Physical Therapy Treatment Patient Details Name: Mariah Farrell MRN: [id number] DOB: Apr 28, 1941 Today's Date: 07/20/2012 Time: 4098-1191 PT Time Calculation (min): 31 min  PT Assessment / Plan / Recommendation Comments on Treatment Session       Follow Up Recommendations  Home health PT     Does the patient have the potential to tolerate intense rehabilitation     Barriers to Discharge        Equipment Recommendations  None recommended by PT    Recommendations for Other Services OT consult  Frequency 7X/week   Plan Discharge plan remains appropriate    Precautions / Restrictions Precautions Precautions: Knee;Fall Restrictions Weight Bearing Restrictions: No Other Position/Activity Restrictions: WBAT   Pertinent Vitals/Pain     Mobility  Bed Mobility Bed Mobility: Supine to Sit Supine to Sit: 4: Min guard Details for Bed Mobility Assistance: min cues for sequence and use of L LE to self assist Transfers Transfers: Sit to Stand;Stand to Sit Sit to Stand: 4: Min guard Stand to Sit: 4: Min guard Details for Transfer Assistance: cues to back up completely against bed prior to sitting and extending leg Ambulation/Gait Ambulation/Gait Assistance: 4: Min guard Ambulation Distance (Feet): 123 Feet Assistive device: Rolling walker Ambulation/Gait Assistance Details: min cues for posture and position from RW Gait Pattern: Step-to pattern Stairs: No    Exercises Total Joint Exercises Ankle Circles/Pumps: AROM;Both;10 reps;Supine Quad Sets: AROM;Both;Supine;15 reps Heel Slides: AAROM;Supine;Right;15 reps Straight Leg Raises: AAROM;Right;Supine;AROM;15 reps   PT Diagnosis:    PT Problem List:   PT Treatment Interventions:     PT Goals Acute Rehab PT Goals PT Goal Formulation: With patient Time For Goal Achievement: 07/27/12 Potential to Achieve Goals: Good Pt will go Supine/Side to Sit: with supervision PT Goal: Supine/Side to Sit - Progress: Progressing toward  goal Pt will go Sit to Supine/Side: with supervision PT Goal: Sit to Supine/Side - Progress: Progressing toward goal Pt will go Sit to Stand: with supervision PT Goal: Sit to Stand - Progress: Progressing toward goal Pt will go Stand to Sit: with supervision PT Goal: Stand to Sit - Progress: Progressing toward goal Pt will Ambulate: 51 - 150 feet;with supervision;with rolling walker PT Goal: Ambulate - Progress: Progressing toward goal  Visit Information  Last PT Received On: 07/20/12 Assistance Needed: +1    Subjective Data  Subjective: I'm doing ok but I don't think I'm ready to go home today Patient Stated Goal: Resume previous active lifestyle with decreased pain   Cognition  Cognition Overall Cognitive Status: Appears within functional limits for tasks assessed/performed Arousal/Alertness: Awake/alert Orientation Level: Appears intact for tasks assessed Behavior During Session: [facility] for tasks performed    Balance     End of Session PT - End of Session Equipment Utilized During Treatment: Gait belt Activity Tolerance: Patient tolerated treatment well Patient left: in chair;with call bell/phone within reach;with family/visitor present Nurse Communication: Mobility status   GP     Delani Kohli 07/20/2012, 1:00 PM

## 2012-07-20 NOTE — Progress Notes (Signed)
   Subjective: 1 Day Post-Op Procedure(s) (LRB): TOTAL KNEE ARTHROPLASTY (Right)   Patient reports pain as mild, well controlled with medications. No events throughout the night. D/C home if does well with PT.  Objective:   VITALS:   Filed Vitals:   07/20/12 0637  BP: 96/59  Pulse: 61  Temp: 98.4 F (36.9 C)  Resp: 14    Neurovascular intact Dorsiflexion/Plantar flexion intact Incision: dressing C/D/I No cellulitis present Compartment soft  LABS  Recent Labs  07/20/12   0520  HGB 11.6*  HCT 34.1*  WBC 9.2  PLT 212     Recent Labs  07/20/12   0520  NA 140  K 3.7  BUN 14  CREATININE 0.67  GLUCOSE 101*     Assessment/Plan: 1 Day Post-Op Procedure(s) (LRB): TOTAL KNEE ARTHROPLASTY (Right) HV drain d/c'ed Foley cath d/c'ed Advance diet Up with therapy D/C IV fluids Discharge home with home health if she does well with PT  Expected ABLA  Treated with iron and will observe  Obese (BMI 30-39.9) Estimated body mass index is 30.17 kg/(m^2) as calculated from the following:   Height as of this encounter: 5\' 2"  (1.575 m).   Weight as of this encounter: 74.844 kg (165 lb). Patient also counseled that weight may inhibit the healing process Patient counseled that losing weight will help with future health issues      Anastasio Auerbach. Lessly Stigler   PAC  07/20/2012, 8:01 AM

## 2012-07-20 NOTE — Evaluation (Signed)
Occupational Therapy Evaluation Patient Details Name: Mariah Farrell MRN: [id number] DOB: 11-Nov-1940 Today's Date: 07/20/2012 Time: 3086-5784 OT Time Calculation (min): 31 min  OT Assessment / Plan / Recommendation Clinical Impression  This 72 year old female was admitted for R TKA.  All education was completed.  Pt does not need any further OT nor DME for bathroom.      OT Assessment  Patient does not need any further OT services    Follow Up Recommendations  No OT follow up    Barriers to Discharge      Equipment Recommendations  None recommended by OT    Recommendations for Other Services    Frequency       Precautions / Restrictions Precautions Precautions: Knee;Fall Restrictions Weight Bearing Restrictions: No Other Position/Activity Restrictions: WBAT   Pertinent Vitals/Pain RLE moderate with weight bearing.  RN brought meds and pt repositioned with ice     ADL  Grooming: Performed;Set up;Wash/dry hands Where Assessed - Grooming: Supported sitting Upper Body Bathing: Performed;Set up Where Assessed - Upper Body Bathing: Unsupported sitting Lower Body Bathing: Performed;Minimal assistance Where Assessed - Lower Body Bathing: Supported sit to stand Upper Body Dressing: Minimal assistance;Performed (for iv line) Where Assessed - Upper Body Dressing: Unsupported sitting Lower Body Dressing: Performed;Minimal assistance Where Assessed - Lower Body Dressing: Supported sit to stand Toilet Transfer: Simulated;Min Pension scheme manager Method: Sit to Barista:  (bathroom to bed) Toileting - Clothing Manipulation and Hygiene: Simulated;Min guard Where Assessed - Engineer, mining and Hygiene: Sit to stand from 3-in-1 or toilet Tub/Shower Transfer: Performed;Min guard Tub/Shower Transfer Method: Science writer: Walk in Scientist, research (physical sciences) Used: Rolling walker Transfers/Ambulation Related to ADLs: pt had just  gotten off of commode with nt ADL Comments: husband will help with adls as needed.  Educated that pt can use borrowed 3:1 as shower seat but recommend husband wipe legs to prevent pins from rusting    OT Diagnosis:    OT Problem List:   OT Treatment Interventions:     OT Goals    Visit Information  Last OT Received On: 07/20/12 Assistance Needed: +1    Subjective Data  Subjective: Would you hand me that little makeup bag.  I may put some lotion on later Patient Stated Goal: home   Prior Functioning     Home Living Lives With: Spouse Bathroom Shower/Tub: Walk-in Stage manager: Standard Home Adaptive Equipment: Environmental consultant - rolling (borrowed) Prior Function Level of Independence: Independent Able to Take Stairs?: Yes Driving: Yes Communication Communication: No difficulties         Vision/Perception     Cognition  Cognition Overall Cognitive Status: Appears within functional limits for tasks assessed/performed Arousal/Alertness: Awake/alert Orientation Level: Appears intact for tasks assessed Behavior During Session: [name] for tasks performed    Extremity/Trunk Assessment Right Upper Extremity Assessment RUE ROM/Strength/Tone: [facility] for tasks assessed Left Upper Extremity Assessment LUE ROM/Strength/Tone: WFL for tasks assessed     Mobility Bed Mobility Supine to Sit: 4: Min assist Transfers Sit to Stand: 4: Min guard Details for Transfer Assistance: cues to back up completely against bed prior to sitting and extending leg     Exercise     Balance     End of Session OT - End of Session Activity Tolerance: Patient tolerated treatment well Patient left: in bed;with call bell/phone within reach;with family/visitor present  GO     Mariah Farrell 07/20/2012, 10:23 AM Marica Otter, OTR/L [phone] 07/20/2012

## 2012-07-20 NOTE — Progress Notes (Signed)
Physical Therapy Treatment Patient Details Name: Mariah Farrell MRN: [id number] DOB: [birth year] Today's Date: 07/20/2012 Time: 4098-1191 PT Time Calculation (min): 24 min  PT Assessment / Plan / Recommendation Comments on Treatment Session       Follow Up Recommendations  Home health PT     Does the patient have the potential to tolerate intense rehabilitation     Barriers to Discharge        Equipment Recommendations  None recommended by PT    Recommendations for Other Services OT consult  Frequency 7X/week   Plan Discharge plan remains appropriate    Precautions / Restrictions Precautions Precautions: Knee;Fall Restrictions Weight Bearing Restrictions: No Other Position/Activity Restrictions: WBAT   Pertinent Vitals/Pain "Not bad when I am up moving"    Mobility  Bed Mobility Bed Mobility: Supine to Sit;Sit to Supine Supine to Sit: 5: Supervision Sit to Supine: 5: Supervision Transfers Transfers: Sit to Stand;Stand to Sit Sit to Stand: 5: Supervision Stand to Sit: 5: Supervision Details for Transfer Assistance: cues for use of UEs for saftey Ambulation/Gait Ambulation/Gait Assistance: 4: Min guard;5: Supervision Ambulation Distance (Feet): 150 Feet Assistive device: Rolling walker Ambulation/Gait Assistance Details: min cues for posture and position from RW Gait Pattern: Step-to pattern Stairs: Yes Stairs Assistance: 4: Min assist Stairs Assistance Details (indicate cue type and reason): cues for sequence and foot/RW placement Stair Management Technique: No rails;Backwards;With walker;Step to pattern Number of Stairs: 2 (twice)    Exercises     PT Diagnosis:    PT Problem List:   PT Treatment Interventions:     PT Goals Acute Rehab PT Goals PT Goal Formulation: With patient Time For Goal Achievement: 07/27/12 Potential to Achieve Goals: Good Pt will go Supine/Side to Sit: with supervision PT Goal: Supine/Side to Sit - Progress: Met Pt will go  Sit to Supine/Side: with supervision PT Goal: Sit to Supine/Side - Progress: Met Pt will go Sit to Stand: with supervision PT Goal: Sit to Stand - Progress: Met Pt will go Stand to Sit: with supervision PT Goal: Stand to Sit - Progress: Met Pt will Ambulate: 51 - 150 feet;with supervision;with rolling walker PT Goal: Ambulate - Progress: Met Pt will Go Up / Down Stairs: 1-2 stairs;with min assist;with least restrictive assistive device PT Goal: Up/Down Stairs - Progress: Met  Visit Information  Last PT Received On: 07/20/12 Assistance Needed: +1    Subjective Data  Subjective: We've decided we are ready to go home Patient Stated Goal: Resume previous active lifestyle with decreased pain   Cognition  Cognition Overall Cognitive Status: Appears within functional limits for tasks assessed/performed Arousal/Alertness: Awake/alert Orientation Level: Appears intact for tasks assessed Behavior During Session: [facility] for tasks performed    Balance     End of Session PT - End of Session Activity Tolerance: Patient tolerated treatment well Patient left: in bed;with call bell/phone within reach;with family/visitor present Nurse Communication: Mobility status   GP     Mariah Farrell 07/20/2012, 3:40 PM

## 2012-07-20 NOTE — Plan of Care (Signed)
Problem: Consults Goal: Diagnosis- Total Joint Replacement Outcome: Completed/Met Date Met:  07/20/12 Primary Total Knee RIGHT     

## 2012-07-20 NOTE — Progress Notes (Signed)
Pt to d/c home with [facility] home [facility]. AVS reviewed and "My Chart" discussed with pt. Pt capable of verbalizing medications and follow-up appointments. Remains hemodynamically stable. No signs and symptoms of distress. Educated pt to return to ER in the case of SOB, dizziness, or chest pain.  

## 2012-07-21 DIAGNOSIS — Z96649 Presence of unspecified artificial hip joint: Secondary | ICD-10-CM | POA: Diagnosis not present

## 2012-07-21 DIAGNOSIS — Z96659 Presence of unspecified artificial knee joint: Secondary | ICD-10-CM | POA: Diagnosis not present

## 2012-07-21 DIAGNOSIS — Z7982 Long term (current) use of aspirin: Secondary | ICD-10-CM | POA: Diagnosis not present

## 2012-07-21 DIAGNOSIS — Z471 Aftercare following joint replacement surgery: Secondary | ICD-10-CM | POA: Diagnosis not present

## 2012-07-21 DIAGNOSIS — IMO0001 Reserved for inherently not codable concepts without codable children: Secondary | ICD-10-CM | POA: Diagnosis not present

## 2012-07-21 DIAGNOSIS — Z4801 Encounter for change or removal of surgical wound dressing: Secondary | ICD-10-CM | POA: Diagnosis not present

## 2012-07-21 NOTE — Discharge Summary (Signed)
Physician Discharge Summary  Patient ID: Mariah Farrell MRN: [id number] DOB/AGE: 06/29/1940 72 y.o.  Admit date: 07/19/2012 Discharge date: 07/20/2012   Procedures:  Procedure(s) (LRB): TOTAL KNEE ARTHROPLASTY (Right)  Attending Physician:  Dr. Durene Romans   Admission Diagnoses:   Right knee OA / pain  Discharge Diagnoses:  Principal Problem:   S/P right TKA Active Problems:   Expected blood loss anemia   Obesity, unspecified  Diagnosis  . Goiter  . GERD (gastroesophageal reflux disease)  . Headache  . Cancer - bladder  . Arthritis  . PONV (postoperative nausea and vomiting)    HPI: Mariah Farrell, 72 y.o. female, has a history of pain and functional disability in the right knee due to arthritis and has failed non-surgical conservative treatments for greater than 12 weeks to includeNSAID's and/or analgesics, corticosteriod injections, viscosupplementation injections and activity modification. Onset of symptoms was gradual, starting 5 years ago with gradually worsening course since that time. The patient noted no past surgery on the right knee(s). Patient currently rates pain in the right knee(s) at 9 out of 10 with activity. Patient has worsening of pain with activity and weight bearing, pain that interferes with activities of daily living, pain with passive range of motion, crepitus and joint swelling. Patient has evidence of periarticular osteophytes and joint space narrowing by imaging studies. There is no active infection. Risks, benefits and expectations were discussed with the patient. Patient understand the risks, benefits and expectations and wishes to proceed with surgery.   PCP: Sonda Primes, MD   Discharged Condition: good  Hospital Course:  Patient underwent the above stated procedure on 07/19/2012. Patient tolerated the procedure well and brought to the recovery room in good condition and subsequently to the floor.  POD #1 BP: 96/59 ; Pulse: 61 ; Temp:  98.4 F (36.9 C) ; Resp: 14 Pt's foley was removed, as well as the hemovac drain removed. IV was changed to a saline lock. Patient reports pain as mild, well controlled with medications. No events throughout the night. D/C home if does well with PT. Neurovascular intact, dorsiflexion/plantar flexion intact, incision: dressing C/D/I, no cellulitis present and compartment soft.   LABS  Basename  07/20/12 0520   HGB  11.6  HCT  34.1    Discharge Exam: General appearance: alert, cooperative and no distress Extremities: Homans sign is negative, no sign of DVT, no edema, redness or tenderness in the calves or thighs and no ulcers, gangrene or trophic changes  Disposition:   Home-Health Care Svc with follow up in 2 weeks   Follow-up Information   Follow up with Shelda Pal, MD. Call in 2 weeks.   Contact information:   [address] [place] [address] [phone]       Discharge Orders   Future Appointments Provider Department Dept Phone   09/28/2012 8:15 AM Tresa Garter, MD [facility] [phone]   Future Orders Complete By Expires     Call MD / Call 911  As directed     Comments:      If you experience chest pain or shortness of breath, CALL 911 and be transported to the hospital emergency room.  If you develope a fever above 101 F, pus (white drainage) or increased drainage or redness at the wound, or calf pain, call your surgeon's office.    Change dressing  As directed     Comments:      Maintain surgical dressing for 10-14  days, then change the dressing daily with sterile 4 x 4 inch gauze dressing and tape. Keep the area dry and clean.    Constipation Prevention  As directed     Comments:      Drink plenty of fluids.  Prune juice may be helpful.  You may use a stool softener, such as Colace (over the counter) 100 mg twice a day.  Use MiraLax (over the counter) for constipation as needed.    Diet - low sodium heart healthy   As directed     Discharge instructions  As directed     Comments:      Maintain surgical dressing for 10-14 days, then replace with gauze and tape. Keep the area dry and clean until follow up. Follow up in 2 weeks at [facility]. Call with any questions or concerns.    Driving restrictions  As directed     Comments:      No driving for 4 weeks    Increase activity slowly as tolerated  As directed     TED hose  As directed     Comments:      Use stockings (TED hose) for 2 weeks on both leg(s).  You may remove them at night for sleeping.    Weight bearing as tolerated  As directed          Medication List    STOP taking these medications       aspirin 81 MG tablet      TAKE these medications       acetaminophen 500 MG tablet  Commonly known as:  TYLENOL  Take 2 tablets (1,000 mg total) by mouth every 8 (eight) hours.     aspirin EC 325 MG tablet  Take 1 tablet (325 mg total) by mouth 2 (two) times daily.     Biotin 5000 MCG Tabs  Take 1 tablet by mouth daily.     calcium-vitamin D 500-200 MG-UNIT per tablet  Commonly known as:  OSCAL WITH D  Take 1 tablet by mouth daily.     celecoxib 200 MG capsule  Commonly known as:  CELEBREX  Take 200 mg by mouth daily.     cetirizine 10 MG tablet  Commonly known as:  ZYRTEC  Take 10 mg by mouth daily.     diphenhydrAMINE 25 mg capsule  Commonly known as:  BENADRYL  Take 1 capsule (25 mg total) by mouth every 6 (six) hours as needed for itching, allergies or sleep.     DSS 100 MG Caps  Take 100 mg by mouth 2 (two) times daily.     ferrous sulfate 325 (65 FE) MG tablet  Take 1 tablet (325 mg total) by mouth 3 (three) times daily after meals.     gabapentin 300 MG capsule  Commonly known as:  NEURONTIN  Take 300 mg by mouth at bedtime as needed. Back pain     glucosamine-chondroitin 500-400 MG tablet  Take 1 tablet by mouth daily.     HYDROmorphone 2 MG tablet  Commonly known as:  DILAUDID  Take 1-2  tablets (2-4 mg total) by mouth every 4 (four) hours as needed for pain.     lactobacillus acidophilus Tabs  Take 1 tablet by mouth daily.     lansoprazole 15 MG capsule  Commonly known as:  PREVACID  Take 15 mg by mouth daily.     levothyroxine 25 MCG tablet  Commonly known as:  SYNTHROID, LEVOTHROID  Take 25  mcg by mouth daily before breakfast.     magnesium oxide 400 MG tablet  Commonly known as:  MAG-OX  Take 400 mg by mouth daily.     Melatonin 3 MG Tabs  Take 1 tablet by mouth at bedtime as needed. sleep     multivitamin tablet  Take 1 tablet by mouth daily.     polyethylene glycol packet  Commonly known as:  MIRALAX / GLYCOLAX  Take 17 g by mouth 2 (two) times daily.     tiZANidine 4 MG capsule  Commonly known as:  ZANAFLEX  Take 1 capsule (4 mg total) by mouth 3 (three) times daily. Muscle spasms     vitamin C 500 MG tablet  Commonly known as:  ASCORBIC ACID  Take 500-1,000 mg by mouth daily.     zolpidem 10 MG tablet  Commonly known as:  AMBIEN  Take 10 mg by mouth at bedtime as needed. Sleep         Signed: Anastasio Auerbach. Micael Barb   PAC  07/21/2012, 5:42 PM

## 2012-07-22 DIAGNOSIS — Z471 Aftercare following joint replacement surgery: Secondary | ICD-10-CM | POA: Diagnosis not present

## 2012-07-22 DIAGNOSIS — Z96659 Presence of unspecified artificial knee joint: Secondary | ICD-10-CM | POA: Diagnosis not present

## 2012-07-22 DIAGNOSIS — IMO0001 Reserved for inherently not codable concepts without codable children: Secondary | ICD-10-CM | POA: Diagnosis not present

## 2012-07-22 DIAGNOSIS — Z4801 Encounter for change or removal of surgical wound dressing: Secondary | ICD-10-CM | POA: Diagnosis not present

## 2012-07-22 DIAGNOSIS — Z7982 Long term (current) use of aspirin: Secondary | ICD-10-CM | POA: Diagnosis not present

## 2012-07-25 DIAGNOSIS — Z96659 Presence of unspecified artificial knee joint: Secondary | ICD-10-CM | POA: Diagnosis not present

## 2012-07-25 DIAGNOSIS — IMO0001 Reserved for inherently not codable concepts without codable children: Secondary | ICD-10-CM | POA: Diagnosis not present

## 2012-07-25 DIAGNOSIS — Z7982 Long term (current) use of aspirin: Secondary | ICD-10-CM | POA: Diagnosis not present

## 2012-07-25 DIAGNOSIS — Z471 Aftercare following joint replacement surgery: Secondary | ICD-10-CM | POA: Diagnosis not present

## 2012-07-25 DIAGNOSIS — Z4801 Encounter for change or removal of surgical wound dressing: Secondary | ICD-10-CM | POA: Diagnosis not present

## 2012-07-27 DIAGNOSIS — Z4801 Encounter for change or removal of surgical wound dressing: Secondary | ICD-10-CM | POA: Diagnosis not present

## 2012-07-27 DIAGNOSIS — Z7982 Long term (current) use of aspirin: Secondary | ICD-10-CM | POA: Diagnosis not present

## 2012-07-27 DIAGNOSIS — IMO0001 Reserved for inherently not codable concepts without codable children: Secondary | ICD-10-CM | POA: Diagnosis not present

## 2012-07-29 DIAGNOSIS — Z4801 Encounter for change or removal of surgical wound dressing: Secondary | ICD-10-CM | POA: Diagnosis not present

## 2012-07-29 DIAGNOSIS — Z96659 Presence of unspecified artificial knee joint: Secondary | ICD-10-CM | POA: Diagnosis not present

## 2012-07-29 DIAGNOSIS — Z471 Aftercare following joint replacement surgery: Secondary | ICD-10-CM | POA: Diagnosis not present

## 2012-07-29 DIAGNOSIS — Z7982 Long term (current) use of aspirin: Secondary | ICD-10-CM | POA: Diagnosis not present

## 2012-07-29 DIAGNOSIS — IMO0001 Reserved for inherently not codable concepts without codable children: Secondary | ICD-10-CM | POA: Diagnosis not present

## 2012-08-01 DIAGNOSIS — Z471 Aftercare following joint replacement surgery: Secondary | ICD-10-CM | POA: Diagnosis not present

## 2012-08-01 DIAGNOSIS — Z96659 Presence of unspecified artificial knee joint: Secondary | ICD-10-CM | POA: Diagnosis not present

## 2012-08-03 DIAGNOSIS — Z4801 Encounter for change or removal of surgical wound dressing: Secondary | ICD-10-CM | POA: Diagnosis not present

## 2012-08-03 DIAGNOSIS — Z471 Aftercare following joint replacement surgery: Secondary | ICD-10-CM | POA: Diagnosis not present

## 2012-08-04 DIAGNOSIS — Z4801 Encounter for change or removal of surgical wound dressing: Secondary | ICD-10-CM | POA: Diagnosis not present

## 2012-08-04 DIAGNOSIS — IMO0001 Reserved for inherently not codable concepts without codable children: Secondary | ICD-10-CM | POA: Diagnosis not present

## 2012-08-04 DIAGNOSIS — Z96659 Presence of unspecified artificial knee joint: Secondary | ICD-10-CM | POA: Diagnosis not present

## 2012-08-04 DIAGNOSIS — Z7982 Long term (current) use of aspirin: Secondary | ICD-10-CM | POA: Diagnosis not present

## 2012-08-04 DIAGNOSIS — Z471 Aftercare following joint replacement surgery: Secondary | ICD-10-CM | POA: Diagnosis not present

## 2012-08-08 DIAGNOSIS — M25569 Pain in unspecified knee: Secondary | ICD-10-CM | POA: Diagnosis not present

## 2012-08-12 DIAGNOSIS — M25569 Pain in unspecified knee: Secondary | ICD-10-CM | POA: Diagnosis not present

## 2012-08-15 DIAGNOSIS — M25569 Pain in unspecified knee: Secondary | ICD-10-CM | POA: Diagnosis not present

## 2012-08-17 DIAGNOSIS — H40019 Open angle with borderline findings, low risk, unspecified eye: Secondary | ICD-10-CM | POA: Diagnosis not present

## 2012-08-19 DIAGNOSIS — M25569 Pain in unspecified knee: Secondary | ICD-10-CM | POA: Diagnosis not present

## 2012-08-24 DIAGNOSIS — M47817 Spondylosis without myelopathy or radiculopathy, lumbosacral region: Secondary | ICD-10-CM | POA: Diagnosis not present

## 2012-08-25 DIAGNOSIS — M25569 Pain in unspecified knee: Secondary | ICD-10-CM | POA: Diagnosis not present

## 2012-08-30 DIAGNOSIS — M25569 Pain in unspecified knee: Secondary | ICD-10-CM | POA: Diagnosis not present

## 2012-08-31 DIAGNOSIS — Z4789 Encounter for other orthopedic aftercare: Secondary | ICD-10-CM | POA: Diagnosis not present

## 2012-09-08 DIAGNOSIS — M4310 Spondylolisthesis, site unspecified: Secondary | ICD-10-CM | POA: Diagnosis not present

## 2012-09-08 DIAGNOSIS — M48061 Spinal stenosis, lumbar region without neurogenic claudication: Secondary | ICD-10-CM | POA: Diagnosis not present

## 2012-09-09 DIAGNOSIS — M5126 Other intervertebral disc displacement, lumbar region: Secondary | ICD-10-CM | POA: Diagnosis not present

## 2012-09-21 DIAGNOSIS — M48061 Spinal stenosis, lumbar region without neurogenic claudication: Secondary | ICD-10-CM | POA: Diagnosis not present

## 2012-09-21 DIAGNOSIS — R937 Abnormal findings on diagnostic imaging of other parts of musculoskeletal system: Secondary | ICD-10-CM | POA: Diagnosis not present

## 2012-09-28 ENCOUNTER — Encounter: Payer: Medicare Other | Admitting: Internal Medicine

## 2012-09-29 ENCOUNTER — Other Ambulatory Visit (INDEPENDENT_AMBULATORY_CARE_PROVIDER_SITE_OTHER): Payer: Medicare Other

## 2012-09-29 ENCOUNTER — Ambulatory Visit (INDEPENDENT_AMBULATORY_CARE_PROVIDER_SITE_OTHER): Payer: Medicare Other | Admitting: Internal Medicine

## 2012-09-29 ENCOUNTER — Telehealth: Payer: Self-pay | Admitting: Internal Medicine

## 2012-09-29 ENCOUNTER — Encounter: Payer: Self-pay | Admitting: Internal Medicine

## 2012-09-29 VITALS — BP 118/78 | HR 74 | Temp 98.9°F | Ht 61.0 in | Wt 172.0 lb

## 2012-09-29 DIAGNOSIS — Z419 Encounter for procedure for purposes other than remedying health state, unspecified: Secondary | ICD-10-CM

## 2012-09-29 DIAGNOSIS — Z5181 Encounter for therapeutic drug level monitoring: Secondary | ICD-10-CM

## 2012-09-29 DIAGNOSIS — M545 Low back pain, unspecified: Secondary | ICD-10-CM

## 2012-09-29 DIAGNOSIS — Z0000 Encounter for general adult medical examination without abnormal findings: Secondary | ICD-10-CM

## 2012-09-29 DIAGNOSIS — N764 Abscess of vulva: Secondary | ICD-10-CM | POA: Diagnosis not present

## 2012-09-29 DIAGNOSIS — Z136 Encounter for screening for cardiovascular disorders: Secondary | ICD-10-CM | POA: Diagnosis not present

## 2012-09-29 HISTORY — PX: LUMBAR FUSION: SHX111

## 2012-09-29 LAB — COMPREHENSIVE METABOLIC PANEL
ALT: 22 U/L (ref 0–35)
AST: 23 U/L (ref 0–37)
Albumin: 3.8 g/dL (ref 3.5–5.2)
Alkaline Phosphatase: 62 U/L (ref 39–117)
BUN: 17 mg/dL (ref 6–23)
Calcium: 9.3 mg/dL (ref 8.4–10.5)
Chloride: 107 mEq/L (ref 96–112)
Potassium: 4.1 mEq/L (ref 3.5–5.1)
Sodium: 137 mEq/L (ref 135–145)
Total Protein: 6.2 g/dL (ref 6.0–8.3)

## 2012-09-29 LAB — CBC
MCHC: 34.6 g/dL (ref 30.0–36.0)
Platelets: 285 10*3/uL (ref 150.0–400.0)

## 2012-09-29 LAB — PROTIME-INR: INR: 1.1 ratio — ABNORMAL HIGH (ref 0.8–1.0)

## 2012-09-29 NOTE — Telephone Encounter (Signed)
error 

## 2012-09-29 NOTE — Progress Notes (Signed)
Subjective:    Patient ID: Mariah Farrell, female    DOB: [birth year], 72 y.o.   MRN: [id number]  HPI  Pt presents today for surgical clearance. She is having L4-L5 spinal fusion with a lateral approach. This is going to be performed on May 13th, 2014 at [facility] in [place], [place]. She does need the notes from today faxed over. Allergies reviewed. She is not currently on aspirin at this time. She does have some reflux but it is well controlled on prevacid. She does take Celebrex on a daily basis but is already prepared to stop this 48 hours prior to surgery. She has had no issues with anesthesia in the past. She does have some post op nausea and vomiting.  Review of Systems      Past Medical History  Diagnosis Date  . Goiter   . GERD (gastroesophageal reflux disease)   . Headache   . Cancer 8 years ago    bladder  . Arthritis   . PONV (postoperative nausea and vomiting)     Current Outpatient Prescriptions  Medication Sig Dispense Refill  . acetaminophen (TYLENOL) 500 MG tablet Take 2 tablets (1,000 mg total) by mouth every 8 (eight) hours.  30 tablet    . aspirin EC 325 MG tablet Take 1 tablet (325 mg total) by mouth 2 (two) times daily.  60 tablet  0  . Biotin 5000 MCG TABS Take 1 tablet by mouth daily.      . calcium-vitamin D (OSCAL WITH D) 500-200 MG-UNIT per tablet Take 1 tablet by mouth daily.      . celecoxib (CELEBREX) 200 MG capsule Take 200 mg by mouth daily.      . cetirizine (ZYRTEC) 10 MG tablet Take 10 mg by mouth daily.      . diphenhydrAMINE (BENADRYL) 25 mg capsule Take 1 capsule (25 mg total) by mouth every 6 (six) hours as needed for itching, allergies or sleep.  30 capsule    . docusate sodium 100 MG CAPS Take 100 mg by mouth 2 (two) times daily.  10 capsule    . ferrous sulfate 325 (65 FE) MG tablet Take 1 tablet (325 mg total) by mouth 3 (three) times daily after meals.      . gabapentin (NEURONTIN) 300 MG capsule Take 300 mg by mouth at  bedtime as needed. Back pain      . glucosamine-chondroitin 500-400 MG tablet Take 1 tablet by mouth daily.      [name] HYDROmorphone (DILAUDID) 2 MG tablet Take 1-2 tablets (2-4 mg total) by mouth every 4 (four) hours as needed for pain.  100 tablet  0  . lactobacillus acidophilus (BACID) TABS Take 1 tablet by mouth daily.      . lansoprazole (PREVACID) 15 MG capsule Take 15 mg by mouth daily.      [name] levothyroxine (SYNTHROID, LEVOTHROID) 25 MCG tablet Take 25 mcg by mouth daily before breakfast.      . magnesium oxide (MAG-OX) 400 MG tablet Take 400 mg by mouth daily.      . Melatonin 3 MG TABS Take 1 tablet by mouth at bedtime as needed. sleep      . Multiple Vitamin (MULTIVITAMIN) tablet Take 1 tablet by mouth daily.      . polyethylene glycol (MIRALAX / GLYCOLAX) packet Take 17 g by mouth 2 (two) times daily.  14 each    . tiZANidine (ZANAFLEX) 4 MG capsule Take 1 capsule (4 mg total) by mouth  3 (three) times daily. Muscle spasms  50 capsule  0  . vitamin C (ASCORBIC ACID) 500 MG tablet Take 500-1,000 mg by mouth daily.       [name] zolpidem (AMBIEN) 10 MG tablet Take 10 mg by mouth at bedtime as needed. Sleep       No current facility-administered medications for this visit.    Allergies  Allergen Reactions  . Esomeprazole Magnesium     palpitations  . Penicillins     Family History  Problem Relation Age of Onset  . Cancer Brother 52    pancreatic    History   Social History  . Marital Status: Married    Spouse Name: N/A    Number of Children: N/A  . Years of Education: N/A   Occupational History  . Not on file.   Social History Main Topics  . Smoking status: Former Smoker -- 0.25 packs/day for 5 years    Types: Cigarettes    Quit date: 06/01/1964  . Smokeless tobacco: Never Used  . Alcohol Use: Yes     Comment: occasional  . Drug Use: No  . Sexually Active: Not on file   Other Topics Concern  . Not on file   Social History Narrative  . No narrative on file      Constitutional: Denies fever, malaise, fatigue, headache or abrupt weight changes.  HEENT: Denies eye pain, eye redness, ear pain, ringing in the ears, wax buildup, runny nose, nasal congestion, bloody nose, or sore throat. Respiratory: Denies difficulty breathing, shortness of breath, cough or sputum production.   Cardiovascular: Denies chest pain, chest tightness, palpitations or swelling in the hands or feet.  Gastrointestinal: Denies abdominal pain, bloating, constipation, diarrhea or blood in the stool.  GU: Denies urgency, frequency, pain with urination, burning sensation, blood in urine, odor or discharge. Musculoskeletal: Pt reports low back pain. Denies difficulty with gait, muscle pain or joint swelling.  Skin: Denies redness, rashes, lesions or ulcercations.  Neurological: Denies dizziness, difficulty with memory, difficulty with speech or problems with balance and coordination.   No other specific complaints in a complete review of systems (except as listed in HPI above).  Objective:   Physical Exam   BP 118/78  Pulse 74  Temp(Src) 98.9 F (37.2 C) (Oral)  Ht 5\' 1"  (1.549 m)  Wt 172 lb (78.019 kg)  BMI 32.52 kg/m2  SpO2 97% Wt Readings from Last 3 Encounters:  09/29/12 172 lb (78.019 kg)  07/19/12 165 lb (74.844 kg)  07/19/12 165 lb (74.844 kg)    General: Appears her stated age, well developed, well nourished in NAD. Skin: Warm, dry and intact. No rashes, lesions or ulcerations noted. HEENT: Head: normal shape and size; Eyes: sclera white, no icterus, conjunctiva pink, PERRLA and EOMs intact; Ears: Tm's gray and intact, normal light reflex; Nose: mucosa pink and moist, septum midline; Throat/Mouth: Teeth present, mucosa pink and moist, no exudate, lesions or ulcerations noted.  Neck: Normal range of motion. Neck supple, trachea midline. No massses, lumps or thyromegaly present.  Cardiovascular: Normal rate and rhythm. S1,S2 noted.  No murmur, rubs or gallops  noted. No JVD or BLE edema. No carotid bruits noted. Pulmonary/Chest: Normal effort and positive vesicular breath sounds. No respiratory distress. No wheezes, rales or ronchi noted.  Abdomen: Soft and nontender. Normal bowel sounds, no bruits noted. No distention or masses noted. Liver, spleen and kidneys non palpable. Musculoskeletal: Decreased flexion and extensions of the back. No signs of joint swelling. No  difficulty with gait.  Neurological: Alert and oriented. Cranial nerves II-XII intact. Coordination normal. +DTRs bilaterally. Psychiatric: Mood and affect normal. Behavior is normal. Judgment and thought content normal.        Assessment & Plan:   Preventative Health maintenance:  Couseled pt about which medications she should stop just prior to surgery EKG obtained- normal and unchanged Will obtain basic labs today If labs normal, pt will be surgically cleared for spinal fusion

## 2012-09-29 NOTE — Patient Instructions (Addendum)
Spinal Fusion Care After Refer to this sheet in the next few weeks. These instructions provide you with information on caring for yourself after your procedure. Your caregiver may also give you more specific instructions. Your treatment has been planned according to current medical practices, but problems sometimes occur. Call your caregiver if you have any problems or questions after your procedure. HOME CARE INSTRUCTIONS   Take whatever pain medicine has been prescribed by your caregiver. Do not take over-the-counter pain medicine unless directed otherwise by your caregiver.  Do not drive if you are taking narcotic pain medicines.  Change your bandage (dressing) if necessary or as directed by your caregiver.  Do not get your surgical cut (incision) wet. After a few days you may take quick showers (rather than baths), but keep your incision clean and dry. Covering the incision with plastic wrap while you shower should keep your incision dry. A few weeks after surgery, once your incision has healed and your caregiver says it is okay, you can take baths or go swimming.  If you have been prescribed medicine to prevent your blood from clotting, follow the directions carefully.  Check the area around your incision often. Look for redness and swelling. Also, look for anything leaking from your wound. You can use a mirror or have a family member inspect your incision if it is in a place where it is difficult for you to see.  Ask your caregiver what activities you should avoid and for how long.  Walk as much as possible.  Do not lift anything heavier than 10 pounds (4.5 kilograms) until your caregiver says it is safe.  Do not twist or bend for a few weeks. Try not to pull on things. Avoid sitting for long periods of time. Change positions at least every hour.  Ask your caregiver what kinds of exercise you should do to make your back stronger and when you should begin doing these exercises. SEEK  IMMEDIATE MEDICAL CARE IF:   Pain suddenly becomes much worse.  The incision area is red, swollen, bleeding, or leaking fluid.  Your legs or feet become increasingly painful, numb, weak, or swollen.  You have trouble controlling urination or bowel movements.  You have trouble breathing.  You have chest pain.  You have a fever. MAKE SURE YOU:  Understand these instructions.  Will watch your condition.  Will get help right away if you are not doing well or get worse. Document Released: 12/05/2004 Document Revised: 08/10/2011 Document Reviewed: 07/31/2010 [facility] Patient Information 2013 [place], [place].

## 2012-09-30 ENCOUNTER — Telehealth: Payer: Self-pay

## 2012-09-30 NOTE — Telephone Encounter (Signed)
Phone call from pt requesting lab results. I let her know all labs are normal. A copy of her labs have been mailed to her. I faxed her lab results and office note to [facility] in [place] [phone]. Their phone # is  [phone].

## 2012-10-04 ENCOUNTER — Telehealth: Payer: Self-pay | Admitting: *Deleted

## 2012-10-04 NOTE — Telephone Encounter (Signed)
The form is in Dr. Sharlyne Cai folder for him to sign.

## 2012-10-04 NOTE — Telephone Encounter (Signed)
Corrine from [facility] called-they have received EKG, OV and labs from 5/1 for surgical clearance but they need Surgical Risk assessment form signed and faxed back to [phone] in order to clear pt for surgery next week.

## 2012-10-06 NOTE — Telephone Encounter (Signed)
It  Was done Thx

## 2012-10-10 ENCOUNTER — Encounter: Payer: Medicare Other | Admitting: Internal Medicine

## 2012-10-11 DIAGNOSIS — Q762 Congenital spondylolisthesis: Secondary | ICD-10-CM | POA: Diagnosis not present

## 2012-10-11 DIAGNOSIS — E049 Nontoxic goiter, unspecified: Secondary | ICD-10-CM | POA: Diagnosis present

## 2012-10-11 DIAGNOSIS — M5126 Other intervertebral disc displacement, lumbar region: Secondary | ICD-10-CM | POA: Diagnosis not present

## 2012-10-11 DIAGNOSIS — Z7982 Long term (current) use of aspirin: Secondary | ICD-10-CM | POA: Diagnosis not present

## 2012-10-11 DIAGNOSIS — K219 Gastro-esophageal reflux disease without esophagitis: Secondary | ICD-10-CM | POA: Diagnosis not present

## 2012-10-11 DIAGNOSIS — E039 Hypothyroidism, unspecified: Secondary | ICD-10-CM | POA: Diagnosis not present

## 2012-10-11 DIAGNOSIS — Z96659 Presence of unspecified artificial knee joint: Secondary | ICD-10-CM | POA: Diagnosis not present

## 2012-10-11 DIAGNOSIS — G8918 Other acute postprocedural pain: Secondary | ICD-10-CM | POA: Diagnosis not present

## 2012-10-11 DIAGNOSIS — M899 Disorder of bone, unspecified: Secondary | ICD-10-CM | POA: Diagnosis present

## 2012-10-11 DIAGNOSIS — IMO0002 Reserved for concepts with insufficient information to code with codable children: Secondary | ICD-10-CM | POA: Diagnosis not present

## 2012-10-11 DIAGNOSIS — M4310 Spondylolisthesis, site unspecified: Secondary | ICD-10-CM | POA: Diagnosis not present

## 2012-10-11 DIAGNOSIS — M5137 Other intervertebral disc degeneration, lumbosacral region: Secondary | ICD-10-CM | POA: Diagnosis not present

## 2012-10-11 DIAGNOSIS — Z8551 Personal history of malignant neoplasm of bladder: Secondary | ICD-10-CM | POA: Diagnosis not present

## 2012-10-11 DIAGNOSIS — Z87891 Personal history of nicotine dependence: Secondary | ICD-10-CM | POA: Diagnosis not present

## 2012-10-11 DIAGNOSIS — M545 Low back pain: Secondary | ICD-10-CM | POA: Diagnosis not present

## 2012-10-11 DIAGNOSIS — Z79899 Other long term (current) drug therapy: Secondary | ICD-10-CM | POA: Diagnosis not present

## 2012-10-11 DIAGNOSIS — M48061 Spinal stenosis, lumbar region without neurogenic claudication: Secondary | ICD-10-CM | POA: Diagnosis not present

## 2012-10-11 DIAGNOSIS — R609 Edema, unspecified: Secondary | ICD-10-CM | POA: Diagnosis not present

## 2012-10-26 DIAGNOSIS — Z1231 Encounter for screening mammogram for malignant neoplasm of breast: Secondary | ICD-10-CM | POA: Diagnosis not present

## 2012-10-27 DIAGNOSIS — M545 Low back pain: Secondary | ICD-10-CM | POA: Diagnosis not present

## 2012-11-07 ENCOUNTER — Encounter: Payer: Self-pay | Admitting: Internal Medicine

## 2012-11-07 ENCOUNTER — Ambulatory Visit (INDEPENDENT_AMBULATORY_CARE_PROVIDER_SITE_OTHER): Payer: Medicare Other | Admitting: Internal Medicine

## 2012-11-07 ENCOUNTER — Other Ambulatory Visit (INDEPENDENT_AMBULATORY_CARE_PROVIDER_SITE_OTHER): Payer: Medicare Other

## 2012-11-07 VITALS — BP 130/72 | HR 76 | Temp 98.0°F | Resp 16 | Ht 62.0 in | Wt 175.0 lb

## 2012-11-07 DIAGNOSIS — E040 Nontoxic diffuse goiter: Secondary | ICD-10-CM

## 2012-11-07 DIAGNOSIS — M545 Low back pain: Secondary | ICD-10-CM

## 2012-11-07 DIAGNOSIS — Z0000 Encounter for general adult medical examination without abnormal findings: Secondary | ICD-10-CM | POA: Diagnosis not present

## 2012-11-07 DIAGNOSIS — E785 Hyperlipidemia, unspecified: Secondary | ICD-10-CM | POA: Diagnosis not present

## 2012-11-07 DIAGNOSIS — M25569 Pain in unspecified knee: Secondary | ICD-10-CM

## 2012-11-07 DIAGNOSIS — M25561 Pain in right knee: Secondary | ICD-10-CM

## 2012-11-07 LAB — URINALYSIS, ROUTINE W REFLEX MICROSCOPIC
Hgb urine dipstick: NEGATIVE
Nitrite: NEGATIVE
Specific Gravity, Urine: >=1.03 (ref 1.000–1.030)
Total Protein, Urine: NEGATIVE
Urine Glucose: NEGATIVE
pH: 5.5 (ref 5.0–8.0)

## 2012-11-07 LAB — BASIC METABOLIC PANEL
BUN: 20 mg/dL (ref 6–23)
Glucose, Bld: 85 mg/dL (ref 70–99)
Sodium: 146 mEq/L — ABNORMAL HIGH (ref 135–145)

## 2012-11-07 LAB — HEPATIC FUNCTION PANEL
Albumin: 3.6 g/dL (ref 3.5–5.2)
Alkaline Phosphatase: 59 U/L (ref 39–117)

## 2012-11-07 LAB — LIPID PANEL
Cholesterol: 186 mg/dL (ref 0–200)
HDL: 57.3 mg/dL (ref >39.00)
VLDL: 25 mg/dL (ref 0.0–40.0)

## 2012-11-07 LAB — TSH: TSH: 1.7 u[IU]/mL (ref 0.35–5.50)

## 2012-11-07 MED ORDER — DIAZEPAM 5 MG PO TABS: 2.5000 mg | ORAL_TABLET | Freq: Two times a day (BID) | ORAL | Status: DC | PRN

## 2012-11-07 MED ORDER — LEVOTHYROXINE SODIUM 25 MCG PO TABS: 25.0000 ug | ORAL_TABLET | Freq: Every day | ORAL | Status: DC

## 2012-11-07 NOTE — Assessment & Plan Note (Signed)
B OA S/p R TKR 2014

## 2012-11-07 NOTE — Assessment & Plan Note (Addendum)
Continue with current prescription therapy as reflected on the Med list. TSH, [place]

## 2012-11-07 NOTE — Assessment & Plan Note (Signed)
Here for medicare wellness/physical  Diet: heart healthy  Physical activity: sedentary - trying to walk Depression/mood screen: negative  Hearing: intact to whispered voice  Visual acuity: grossly normal, performs annual eye exam  ADLs: capable  Fall risk: none  Home safety: good  Cognitive evaluation: intact to orientation, naming, recall and repetition  EOL planning: adv directives, full code/ I agree  I have personally reviewed and have noted  1. The patient's medical and social history  2. Their use of alcohol, tobacco or illicit drugs  3. Their current medications and supplements  4. The patient's functional ability including ADL's, fall risks, home safety risks and hearing or visual impairment.  5. Diet and physical activities  6. Evidence for depression or mood disorders    Today patient counseled on age appropriate routine health concerns for screening and prevention, each reviewed and up to date or declined. Immunizations reviewed and up to date or declined. Labs ordered and reviewed. Risk factors for depression reviewed and negative. Hearing function and visual acuity are intact. ADLs screened and addressed as needed. Functional ability and level of safety reviewed and appropriate. Education, counseling and referrals performed based on assessed risks today. Patient provided with a copy of personalized plan for preventive services.

## 2012-11-07 NOTE — Progress Notes (Signed)
Subjective:    HPI  The patient is here for a wellness exam. The patient has been doing well overall without major physical or psychological issues going on lately - she is s/p back surgery for LBP/RLE pain - better. The patient needs to address  chronic hypothyroidism that has been well controlled with medicine C/o B leg swelling started 1 mo ago. C/o ringing in the ears x long time  BP Readings from Last 3 Encounters:  11/07/12 130/72  09/29/12 118/78  07/20/12 110/67   Wt Readings from Last 3 Encounters:  11/07/12 175 lb (79.379 kg)  09/29/12 172 lb (78.019 kg)  07/19/12 165 lb (74.844 kg)      Review of Systems  Constitutional: Negative for fever, chills, diaphoresis, activity change, appetite change, fatigue and unexpected weight change.  HENT: Negative for hearing loss, ear pain, congestion, sore throat, sneezing, mouth sores, neck pain, dental problem, voice change, postnasal drip and sinus pressure.   Eyes: Negative for pain and visual disturbance.  Respiratory: Negative for cough, chest tightness, wheezing and stridor.   Cardiovascular: Negative for chest pain, palpitations and leg swelling.  Gastrointestinal: Negative for nausea, vomiting, abdominal pain, blood in stool, abdominal distention and rectal pain.  Genitourinary: Negative for dysuria, hematuria, decreased urine volume, vaginal bleeding, vaginal discharge, difficulty urinating, vaginal pain and menstrual problem.  Musculoskeletal: Positive for back pain and arthralgias. Negative for joint swelling and gait problem.  Skin: Negative for color change, rash and wound.  Neurological: Positive for numbness. Negative for dizziness, tremors, syncope, speech difficulty and light-headedness.  Hematological: Negative for adenopathy.  Psychiatric/Behavioral: Negative for suicidal ideas, hallucinations, behavioral problems, confusion, sleep disturbance, dysphoric mood and decreased concentration. The patient is not  hyperactive.        Objective:   Physical Exam  Constitutional: She appears well-developed. No distress.  Obese   HENT:  Head: Normocephalic.  Right Ear: External ear normal.  Left Ear: External ear normal.  Nose: Nose normal.  Mouth/Throat: Oropharynx is clear and moist.  Eyes: Conjunctivae are normal. Pupils are equal, round, and reactive to light. Right eye exhibits no discharge. Left eye exhibits no discharge.  Neck: Normal range of motion. Neck supple. No JVD present. No tracheal deviation present. No thyromegaly present.  Cardiovascular: Normal rate, regular rhythm and normal heart sounds.   Pulmonary/Chest: No stridor. No respiratory distress. She has no wheezes.  Abdominal: Soft. Bowel sounds are normal. She exhibits no distension and no mass. There is no tenderness. There is no rebound and no guarding.  Musculoskeletal: She exhibits tenderness (B knees). She exhibits no edema.  Lymphadenopathy:    She has no cervical adenopathy.  Neurological: She displays abnormal reflex. No cranial nerve deficit. She exhibits normal muscle tone. Coordination normal.  Skin: No rash noted. No erythema.  Psychiatric: She has a normal mood and affect. Her behavior is normal. Judgment and thought content normal.     Lab Results  Component Value Date   WBC 5.2 09/29/2012   HGB 12.8 09/29/2012   HCT 37.1 09/29/2012   PLT 285.0 09/29/2012   GLUCOSE 94 09/29/2012   CHOL 160 09/28/2011   TRIG 93.0 09/28/2011   HDL 68.10 09/28/2011   LDLCALC 73 09/28/2011   ALT 22 09/29/2012   AST 23 09/29/2012   NA 137 09/29/2012   K 4.1 09/29/2012   CL 107 09/29/2012   CREATININE 0.7 09/29/2012   BUN 17 09/29/2012   CO2 29 09/29/2012   TSH 1.80 09/28/2011   INR  1.1* 09/29/2012        Assessment & Plan:

## 2012-11-07 NOTE — Assessment & Plan Note (Signed)
Better post back surgery - fusion L4-5

## 2012-11-09 ENCOUNTER — Encounter: Payer: Self-pay | Admitting: Internal Medicine

## 2012-11-30 ENCOUNTER — Ambulatory Visit
Admission: RE | Admit: 2012-11-30 | Discharge: 2012-11-30 | Disposition: A | Discharge status: Home / Self Care | Payer: Medicare Other | Source: Ambulatory Visit | Attending: Internal Medicine | Admitting: Internal Medicine

## 2012-11-30 DIAGNOSIS — Z0000 Encounter for general adult medical examination without abnormal findings: Secondary | ICD-10-CM

## 2012-11-30 DIAGNOSIS — M25561 Pain in right knee: Secondary | ICD-10-CM

## 2012-11-30 DIAGNOSIS — E040 Nontoxic diffuse goiter: Secondary | ICD-10-CM

## 2012-11-30 DIAGNOSIS — E042 Nontoxic multinodular goiter: Secondary | ICD-10-CM | POA: Diagnosis not present

## 2012-11-30 DIAGNOSIS — M545 Low back pain: Secondary | ICD-10-CM

## 2012-12-07 DIAGNOSIS — Z8551 Personal history of malignant neoplasm of bladder: Secondary | ICD-10-CM | POA: Diagnosis not present

## 2012-12-07 DIAGNOSIS — C679 Malignant neoplasm of bladder, unspecified: Secondary | ICD-10-CM | POA: Diagnosis not present

## 2012-12-07 DIAGNOSIS — N952 Postmenopausal atrophic vaginitis: Secondary | ICD-10-CM | POA: Diagnosis not present

## 2012-12-13 ENCOUNTER — Telehealth: Payer: Self-pay | Admitting: Internal Medicine

## 2012-12-13 DIAGNOSIS — IMO0002 Reserved for concepts with insufficient information to code with codable children: Secondary | ICD-10-CM | POA: Diagnosis not present

## 2012-12-13 MED ORDER — POTASSIUM CHLORIDE CRYS ER 20 MEQ PO TBCR: 20.0000 meq | EXTENDED_RELEASE_TABLET | Freq: Two times a day (BID) | ORAL | Status: DC

## 2012-12-13 NOTE — Telephone Encounter (Signed)
Pt is at [facility], [place].  Her legs are swelling.  A physician friend wrote an RX for lassix.  She has taken it for 4 days and still has the swelling.  Mr. Odle wants to know if some potassium can be called into the [facility] there.  The number is [phone].  She has an appt next week on July 22.

## 2012-12-13 NOTE — Telephone Encounter (Signed)
Done

## 2012-12-13 NOTE — Telephone Encounter (Signed)
Yes: Klor con 20 meq 1 p qd prn w/a diuretic (#30  3 ref). If Lasix doesn't work, I can call i Demadex (torsemide) Feel better! Thx

## 2012-12-13 NOTE — Telephone Encounter (Signed)
Mariah Farrell is aware.

## 2012-12-14 ENCOUNTER — Telehealth: Payer: Self-pay | Admitting: Internal Medicine

## 2012-12-14 MED ORDER — TORSEMIDE 20 MG PO TABS: 20.0000 mg | ORAL_TABLET | Freq: Every day | ORAL | Status: DC | PRN

## 2012-12-14 NOTE — Telephone Encounter (Signed)
Please advise on below  

## 2012-12-14 NOTE — Telephone Encounter (Signed)
Pt's spouse call stated that Dr. Macario Golds called in potassium for pt for leg swelling but pt call back stated that they need something else to call in (not better). Pt still out of town , please use [facility] and phone number is [phone].

## 2012-12-14 NOTE — Telephone Encounter (Signed)
Ok Demadex - see Rx on med list - pls call in to [phone] [facility] Thx

## 2012-12-15 NOTE — Telephone Encounter (Signed)
Done. Left detailed mess informing pt's husband.

## 2012-12-20 ENCOUNTER — Ambulatory Visit (INDEPENDENT_AMBULATORY_CARE_PROVIDER_SITE_OTHER): Payer: Medicare Other | Admitting: Internal Medicine

## 2012-12-20 VITALS — BP 140/80 | HR 84 | Temp 98.5°F | Resp 16 | Wt 182.0 lb

## 2012-12-20 DIAGNOSIS — R609 Edema, unspecified: Secondary | ICD-10-CM | POA: Diagnosis not present

## 2012-12-20 MED ORDER — TRAMADOL HCL 50 MG PO TABS: 50.0000 mg | ORAL_TABLET | Freq: Two times a day (BID) | ORAL | Status: DC | PRN

## 2012-12-20 NOTE — Patient Instructions (Signed)
Compression knee highs Stop Celebrex and Gabapentin Loosen up the brace

## 2012-12-21 DIAGNOSIS — Z96659 Presence of unspecified artificial knee joint: Secondary | ICD-10-CM | POA: Diagnosis not present

## 2012-12-25 ENCOUNTER — Encounter: Payer: Self-pay | Admitting: Internal Medicine

## 2012-12-25 NOTE — Progress Notes (Signed)
   Subjective:    HPI   C/o B leg swelling started 1+ mo ago, a little better on Demadex   BP Readings from Last 3 Encounters:  12/20/12 140/80  11/07/12 130/72  09/29/12 118/78   Wt Readings from Last 3 Encounters:  12/20/12 182 lb (82.555 kg)  11/07/12 175 lb (79.379 kg)  09/29/12 172 lb (78.019 kg)      Review of Systems  Constitutional: Negative for fever, chills, diaphoresis, activity change, appetite change, fatigue and unexpected weight change.  HENT: Negative for hearing loss, ear pain, congestion, sore throat, sneezing, mouth sores, neck pain, dental problem, voice change, postnasal drip and sinus pressure.   Eyes: Negative for pain and visual disturbance.  Respiratory: Negative for cough, chest tightness, wheezing and stridor.   Cardiovascular: Negative for chest pain, palpitations and leg swelling.  Gastrointestinal: Negative for nausea, vomiting, abdominal pain, blood in stool, abdominal distention and rectal pain.  Genitourinary: Negative for dysuria, hematuria, decreased urine volume, vaginal bleeding, vaginal discharge, difficulty urinating, vaginal pain and menstrual problem.  Musculoskeletal: Positive for back pain and arthralgias. Negative for joint swelling and gait problem.  Skin: Negative for color change, rash and wound.  Neurological: Positive for numbness. Negative for dizziness, tremors, syncope, speech difficulty and light-headedness.  Hematological: Negative for adenopathy.  Psychiatric/Behavioral: Negative for suicidal ideas, hallucinations, behavioral problems, confusion, sleep disturbance, dysphoric mood and decreased concentration. The patient is not hyperactive.        Objective:   Physical Exam  Constitutional: She appears well-developed. No distress.  Obese   HENT:  Head: Normocephalic.  Right Ear: External ear normal.  Left Ear: External ear normal.  Nose: Nose normal.  Mouth/Throat: Oropharynx is clear and moist.  Eyes:  Conjunctivae are normal. Pupils are equal, round, and reactive to light. Right eye exhibits no discharge. Left eye exhibits no discharge.  Neck: Normal range of motion. Neck supple. No JVD present. No tracheal deviation present. No thyromegaly present.  Cardiovascular: Normal rate, regular rhythm and normal heart sounds.   Pulmonary/Chest: No stridor. No respiratory distress. She has no wheezes.  Abdominal: Soft. Bowel sounds are normal. She exhibits no distension and no mass. There is no tenderness. There is no rebound and no guarding.  Musculoskeletal: She exhibits edema (trace B) and tenderness (B knees).  Lymphadenopathy:    She has no cervical adenopathy.  Neurological: She displays abnormal reflex. No cranial nerve deficit. She exhibits normal muscle tone. Coordination normal.  Skin: No rash noted. No erythema.  Psychiatric: She has a normal mood and affect. Her behavior is normal. Judgment and thought content normal.     Lab Results  Component Value Date   WBC 5.2 09/29/2012   HGB 12.8 09/29/2012   HCT 37.1 09/29/2012   PLT 285.0 09/29/2012   GLUCOSE 85 11/07/2012   CHOL 186 11/07/2012   TRIG 125.0 11/07/2012   HDL 57.30 11/07/2012   LDLCALC 104* 11/07/2012   ALT 28 11/07/2012   AST 29 11/07/2012   NA 146* 11/07/2012   K 4.1 11/07/2012   CL 110 11/07/2012   CREATININE 0.8 11/07/2012   BUN 20 11/07/2012   CO2 27 11/07/2012   TSH 1.70 11/07/2012   INR 1.1* 09/29/2012        Assessment & Plan:

## 2012-12-25 NOTE — Assessment & Plan Note (Addendum)
B LEs - multifactorial: chronic venous insufficiency, tight back brace etc Cont Demadex Loosen up back brace NAS diet Further w/up if not better

## 2012-12-27 DIAGNOSIS — M629 Disorder of muscle, unspecified: Secondary | ICD-10-CM | POA: Diagnosis not present

## 2013-02-02 DIAGNOSIS — M961 Postlaminectomy syndrome, not elsewhere classified: Secondary | ICD-10-CM | POA: Diagnosis not present

## 2013-02-20 DIAGNOSIS — H40059 Ocular hypertension, unspecified eye: Secondary | ICD-10-CM | POA: Diagnosis not present

## 2013-02-28 DIAGNOSIS — Z23 Encounter for immunization: Secondary | ICD-10-CM | POA: Diagnosis not present

## 2013-03-29 DIAGNOSIS — M25569 Pain in unspecified knee: Secondary | ICD-10-CM | POA: Diagnosis not present

## 2013-04-06 ENCOUNTER — Other Ambulatory Visit: Payer: Self-pay

## 2013-04-12 DIAGNOSIS — M961 Postlaminectomy syndrome, not elsewhere classified: Secondary | ICD-10-CM | POA: Diagnosis not present

## 2013-06-14 DIAGNOSIS — M961 Postlaminectomy syndrome, not elsewhere classified: Secondary | ICD-10-CM | POA: Diagnosis not present

## 2013-06-22 ENCOUNTER — Other Ambulatory Visit: Payer: Self-pay | Admitting: Internal Medicine

## 2013-06-23 DIAGNOSIS — IMO0002 Reserved for concepts with insufficient information to code with codable children: Secondary | ICD-10-CM | POA: Diagnosis not present

## 2013-06-23 DIAGNOSIS — M25569 Pain in unspecified knee: Secondary | ICD-10-CM | POA: Diagnosis not present

## 2013-06-23 DIAGNOSIS — M1710 Unilateral primary osteoarthritis, unspecified knee: Secondary | ICD-10-CM | POA: Diagnosis not present

## 2013-06-26 DIAGNOSIS — M25569 Pain in unspecified knee: Secondary | ICD-10-CM | POA: Diagnosis not present

## 2013-06-28 DIAGNOSIS — B079 Viral wart, unspecified: Secondary | ICD-10-CM | POA: Diagnosis not present

## 2013-06-28 DIAGNOSIS — D239 Other benign neoplasm of skin, unspecified: Secondary | ICD-10-CM | POA: Diagnosis not present

## 2013-06-28 DIAGNOSIS — L821 Other seborrheic keratosis: Secondary | ICD-10-CM | POA: Diagnosis not present

## 2013-06-28 DIAGNOSIS — D1801 Hemangioma of skin and subcutaneous tissue: Secondary | ICD-10-CM | POA: Diagnosis not present

## 2013-06-29 ENCOUNTER — Telehealth: Payer: Self-pay | Admitting: Internal Medicine

## 2013-06-29 NOTE — Telephone Encounter (Signed)
I've addressed both.  Diazepam was ref on 06/22/13 I'll be happy to see her prior to surgery if she or her surgeon would like me to or if there are any changes in her health. I signed/faxed back the paper Thx

## 2013-06-29 NOTE — Telephone Encounter (Signed)
Paper work faxed. Please check with the drug store about Diazepam, pt stated that they do not have to refill that we called in. Please advise.

## 2013-06-29 NOTE — Telephone Encounter (Signed)
Diazepam phoned in again. Closing phone note.

## 2013-06-29 NOTE — Telephone Encounter (Signed)
Pt called stated that she schedule for knee surgery on 08/01/13 and she need medical clearance from PCP first. Last ov was 11/2012. Pt was wondering if she need to schedule an appt with Dr. Camila Li? Pt also checking on med request on Diazepam, pt stated that she need this med before the weekend. Please advise.

## 2013-07-24 ENCOUNTER — Encounter (HOSPITAL_COMMUNITY)
Admission: RE | Admit: 2013-07-24 | Discharge: 2013-07-24 | Disposition: A | Discharge status: Home / Self Care | Payer: Medicare Other | Source: Ambulatory Visit | Attending: Orthopedic Surgery | Admitting: Orthopedic Surgery

## 2013-07-24 ENCOUNTER — Encounter (HOSPITAL_COMMUNITY): Payer: Self-pay | Admitting: Pharmacy Technician

## 2013-07-24 ENCOUNTER — Encounter (HOSPITAL_COMMUNITY): Payer: Self-pay

## 2013-07-24 DIAGNOSIS — Z01812 Encounter for preprocedural laboratory examination: Secondary | ICD-10-CM | POA: Insufficient documentation

## 2013-07-24 HISTORY — DX: Personal history of other diseases of the digestive system: Z87.19

## 2013-07-24 LAB — BASIC METABOLIC PANEL
BUN: 29 mg/dL — ABNORMAL HIGH (ref 6–23)
CHLORIDE: 104 meq/L (ref 96–112)
CO2: 27 meq/L (ref 19–32)
Calcium: 9.3 mg/dL (ref 8.4–10.5)
Creatinine, Ser: 0.81 mg/dL (ref 0.50–1.10)
GFR calc Af Amer: 81 mL/min — ABNORMAL LOW (ref >90)
GFR calc non Af Amer: 70 mL/min — ABNORMAL LOW (ref >90)
Glucose, Bld: 87 mg/dL (ref 70–99)
Potassium: 4.5 mEq/L (ref 3.7–5.3)
Sodium: 139 mEq/L (ref 137–147)

## 2013-07-24 LAB — CBC
HEMATOCRIT: 38.2 % (ref 36.0–46.0)
HEMOGLOBIN: 12.6 g/dL (ref 12.0–15.0)
MCH: 32.1 pg (ref 26.0–34.0)
MCHC: 33 g/dL (ref 30.0–36.0)
MCV: 97.4 fL (ref 78.0–100.0)
Platelets: 276 10*3/uL (ref 150–400)
RBC: 3.92 MIL/uL (ref 3.87–5.11)
RDW: 13.2 % (ref 11.5–15.5)
WBC: 5.3 10*3/uL (ref 4.0–10.5)

## 2013-07-24 LAB — URINALYSIS, ROUTINE W REFLEX MICROSCOPIC
BILIRUBIN URINE: NEGATIVE
Glucose, UA: NEGATIVE mg/dL
HGB URINE DIPSTICK: NEGATIVE
Ketones, ur: NEGATIVE mg/dL
Nitrite: NEGATIVE
PH: 5.5 (ref 5.0–8.0)
Protein, ur: NEGATIVE mg/dL
SPECIFIC GRAVITY, URINE: 1.028 (ref 1.005–1.030)
Urobilinogen, UA: 0.2 mg/dL (ref 0.0–1.0)

## 2013-07-24 LAB — SURGICAL PCR SCREEN
MRSA, PCR: INVALID — AB
STAPHYLOCOCCUS AUREUS: INVALID — AB

## 2013-07-24 LAB — PROTIME-INR
INR: 1.01 (ref 0.00–1.49)
Prothrombin Time: 13.1 seconds (ref 11.6–15.2)

## 2013-07-24 LAB — APTT: aPTT: 31 seconds (ref 24–37)

## 2013-07-24 LAB — URINE MICROSCOPIC-ADD ON

## 2013-07-24 NOTE — Patient Instructions (Signed)
20 Mariah Farrell  07/24/2013   Your procedure is scheduled on: 08/01/13  Report to [facility] at 7:30 AM.  Call this number if you have problems the morning of surgery [phone]: [phone]   Remember:   Do not eat food or drink liquids After Midnight.     Take these medicines the morning of surgery with A SIP OF WATER: zyrtec, valium if needed, prevacid, synthroid,    Do not wear jewelry, make-up or nail polish.  Do not wear lotions, powders, or perfumes. You may wear deodorant.  Do not shave 48 hours prior to surgery. Men may shave face and neck.  Do not bring valuables to the hospital.  Contacts, dentures or bridgework may not be worn into surgery.  Leave suitcase in the car. After surgery it may be brought to your room.  For patients admitted to the hospital, checkout time is 11:00 AM the day of discharge.     Please read over the following fact sheets that you were given: preparing for surgery sheet, MRSA Information, incentive spirometry fact sheet, blood fact sheet Paulette Blanch, RN  pre op nurse call if needed [phone]    FAILURE TO [facility]   Patient Signature: ___________________________________________

## 2013-07-24 NOTE — Progress Notes (Signed)
EKG 09/29/12 on EPIC, surgery clearance note Dr. Alain Marion 06/27/13 on chart

## 2013-07-24 NOTE — Progress Notes (Signed)
Mico, ua and bmet results faxed to dr Alvan Dame by epic

## 2013-07-27 LAB — MRSA CULTURE

## 2013-07-29 NOTE — H&P (Signed)
TOTAL KNEE ADMISSION H&P  Patient is being admitted for left total knee arthroplasty.  Subjective:  Chief Complaint:    Left knee OA / pain.  HPI: Mariah Farrell, 73 y.o. female, has a history of pain and functional disability in the left knee due to arthritis and has failed non-surgical conservative treatments for greater than 12 weeks to includeNSAID's and/or analgesics, corticosteriod injections, viscosupplementation injections, use of assistive devices and activity modification.  Onset of symptoms was gradual, starting years ago with gradually worsening course since that time. The patient noted prior procedures on the knee to include  arthroplasty on the right knee(s).  Patient currently rates pain in the left knee(s) at 8 out of 10 with activity. Patient has night pain, worsening of pain with activity and weight bearing, pain that interferes with activities of daily living, pain with passive range of motion, crepitus and joint swelling.  Patient has evidence of periarticular osteophytes and joint space narrowing by imaging studies. There is no active infection.   Risks, benefits and expectations were discussed with the patient.  Risks including but not limited to the risk of anesthesia, blood clots, nerve damage, blood vessel damage, failure of the prosthesis, infection and up to and including death.  Patient understand the risks, benefits and expectations and wishes to proceed with surgery.   D/C Plans:      Home with HHPT  Post-op Meds:     No Rx given  Tranexamic Acid:   To be given  Decadron:    To be given  FYI:    ASA post-op  Dilaudid PO post-op after previous surgery   Patient Active Problem List   Diagnosis Date Noted  . Edema 12/20/2012  . Expected blood loss anemia 07/20/2012  . Obesity, unspecified 07/20/2012  . S/P right TKA 07/19/2012  . Tinnitus 09/28/2011  . Well adult exam 09/28/2011  . Knee pain 09/28/2011  . SHOULDER PAIN 06/30/2010  . GOITER, SIMPLE  04/18/2009  . TOBACCO USE, QUIT 04/18/2009  . CHEST PAIN, UNSPECIFIED 04/17/2009  . OSTEOARTHRITIS 04/11/2007  . LOW BACK PAIN 04/11/2007  . PARESTHESIA 04/11/2007  . ABDOMINAL PAIN, RIGHT LOWER QUADRANT 04/11/2007  . COLONIC POLYPS, HX OF 04/11/2007   Past Medical History  Diagnosis Date  . Goiter     synthroid used to shrink  . GERD (gastroesophageal reflux disease)   . Arthritis   . PONV (postoperative nausea and vomiting)   . H/O hiatal hernia   . Headache(784.0)     migraines in morning  . Cancer 13 years ago    bladder    Past Surgical History  Procedure Laterality Date  . Rotator cuff repair Bilateral ~2010, 2012  . Bunionectomy Bilateral   . Bladder surgery  13 years ago  . Total knee arthroplasty Right 07/19/2012    Procedure: TOTAL KNEE ARTHROPLASTY;  Surgeon: Mauri Pole, MD;  Location: [facility];  Service: Orthopedics;  Laterality: Right;  . Tonsillectomy  as child  . Lumbar fusion  May 2014    L4-L5    No prescriptions prior to admission   Allergies  Allergen Reactions  . Esomeprazole Magnesium     palpitations  . Penicillins     History  Substance Use Topics  . Smoking status: Former Smoker -- 0.25 packs/day for 5 years    Types: Cigarettes    Quit date: 06/01/1964  . Smokeless tobacco: Never Used  . Alcohol Use: Yes     Comment: occasional    Family  History  Problem Relation Age of Onset  . Cancer Brother 81    pancreatic     Review of Systems  Constitutional: Negative.   HENT: Positive for tinnitus.   Eyes: Negative.   Respiratory: Negative.   Cardiovascular: Negative.   Gastrointestinal: Positive for heartburn.  Genitourinary: Negative.   Musculoskeletal: Positive for back pain and joint pain.  Skin: Negative.   Neurological: Positive for headaches.  Endo/Heme/Allergies: Negative.   Psychiatric/Behavioral: Negative.     Objective:  Physical Exam  Constitutional: She is oriented to person, place, and time. She appears  well-developed and well-nourished.  HENT:  Head: Normocephalic and atraumatic.  Mouth/Throat: Oropharynx is clear and moist.  Eyes: Pupils are equal, round, and reactive to light.  Neck: Neck supple. No JVD present. No tracheal deviation present. No thyromegaly present.  Cardiovascular: Normal rate, regular rhythm, normal heart sounds and intact distal pulses.   Respiratory: Effort normal and breath sounds normal. No stridor. No respiratory distress. She has no wheezes.  GI: Soft. There is no tenderness. There is no guarding.  Musculoskeletal:       Left knee: She exhibits decreased range of motion, swelling and bony tenderness. She exhibits no effusion, no ecchymosis, no deformity, no laceration and no erythema. Tenderness found.  Lymphadenopathy:    She has no cervical adenopathy.  Neurological: She is alert and oriented to person, place, and time.  Skin: Skin is warm and dry.  Psychiatric: She has a normal mood and affect.     Labs:  Estimated body mass index is 33.28 kg/(m^2) as calculated from the following:   Height as of 11/07/12: 5\' 2"  (1.575 m).   Weight as of 12/20/12: 82.555 kg (182 lb).   Imaging Review Plain radiographs demonstrate severe degenerative joint disease of the left knee(s). The overall alignment isneutral. The bone quality appears to be good for age and reported activity level.  Assessment/Plan:  End stage arthritis, left knee   The patient history, physical examination, clinical judgment of the provider and imaging studies are consistent with end stage degenerative joint disease of the left knee(s) and total knee arthroplasty is deemed medically necessary. The treatment options including medical management, injection therapy arthroscopy and arthroplasty were discussed at length. The risks and benefits of total knee arthroplasty were presented and reviewed. The risks due to aseptic loosening, infection, stiffness, patella tracking problems, thromboembolic  complications and other imponderables were discussed. The patient acknowledged the explanation, agreed to proceed with the plan and consent was signed. Patient is being admitted for inpatient treatment for surgery, pain control, PT, OT, prophylactic antibiotics, VTE prophylaxis, progressive ambulation and ADL's and discharge planning. The patient is planning to be discharged home with home health services.    West Pugh Johanne Mcglade   PAC  07/29/2013, 11:17 AM

## 2013-08-01 ENCOUNTER — Inpatient Hospital Stay (HOSPITAL_COMMUNITY): Payer: Medicare Other | Admitting: Anesthesiology

## 2013-08-01 ENCOUNTER — Encounter (HOSPITAL_COMMUNITY): Payer: Medicare Other | Admitting: Anesthesiology

## 2013-08-01 ENCOUNTER — Encounter (HOSPITAL_COMMUNITY)
Admission: RE | Disposition: A | Discharge status: Home Health | Payer: Self-pay | Source: Ambulatory Visit | Attending: Orthopedic Surgery

## 2013-08-01 ENCOUNTER — Encounter (HOSPITAL_COMMUNITY): Payer: Self-pay | Admitting: *Deleted

## 2013-08-01 ENCOUNTER — Inpatient Hospital Stay (HOSPITAL_COMMUNITY)
Admission: RE | Admit: 2013-08-01 | Discharge: 2013-08-02 | DRG: 470 | Disposition: A | Discharge status: Home Health | Payer: Medicare Other | Source: Ambulatory Visit | Attending: Orthopedic Surgery | Admitting: Orthopedic Surgery

## 2013-08-01 DIAGNOSIS — Z96659 Presence of unspecified artificial knee joint: Secondary | ICD-10-CM | POA: Diagnosis not present

## 2013-08-01 DIAGNOSIS — Z6833 Body mass index (BMI) 33.0-33.9, adult: Secondary | ICD-10-CM

## 2013-08-01 DIAGNOSIS — E669 Obesity, unspecified: Secondary | ICD-10-CM | POA: Diagnosis present

## 2013-08-01 DIAGNOSIS — Z87891 Personal history of nicotine dependence: Secondary | ICD-10-CM | POA: Diagnosis not present

## 2013-08-01 DIAGNOSIS — Z860100 Personal history of colon polyps, unspecified: Secondary | ICD-10-CM

## 2013-08-01 DIAGNOSIS — Z981 Arthrodesis status: Secondary | ICD-10-CM

## 2013-08-01 DIAGNOSIS — E049 Nontoxic goiter, unspecified: Secondary | ICD-10-CM | POA: Diagnosis present

## 2013-08-01 DIAGNOSIS — K219 Gastro-esophageal reflux disease without esophagitis: Secondary | ICD-10-CM | POA: Diagnosis present

## 2013-08-01 DIAGNOSIS — IMO0002 Reserved for concepts with insufficient information to code with codable children: Secondary | ICD-10-CM | POA: Diagnosis not present

## 2013-08-01 DIAGNOSIS — M1710 Unilateral primary osteoarthritis, unspecified knee: Principal | ICD-10-CM | POA: Diagnosis present

## 2013-08-01 HISTORY — PX: TOTAL KNEE ARTHROPLASTY: SHX125

## 2013-08-01 LAB — TYPE AND SCREEN
ABO/RH(D): A NEG
Antibody Screen: NEGATIVE

## 2013-08-01 SURGERY — ARTHROPLASTY, KNEE, TOTAL
Anesthesia: Spinal | Site: Knee | Laterality: Left

## 2013-08-01 MED ORDER — CLINDAMYCIN PHOSPHATE 900 MG/50ML IV SOLN
900.0000 mg | INTRAVENOUS | Status: AC
  Administered 2013-08-01: 900 mg via INTRAVENOUS

## 2013-08-01 MED ORDER — BUPIVACAINE LIPOSOME 1.3 % IJ SUSP
20.0000 mL | Freq: Once | INTRAMUSCULAR | Status: AC
  Administered 2013-08-01: 20 mL
  Filled 2013-08-01: qty 20

## 2013-08-01 MED ORDER — PROPOFOL 10 MG/ML IV BOLUS
INTRAVENOUS | Status: AC
  Filled 2013-08-01: qty 20

## 2013-08-01 MED ORDER — SODIUM CHLORIDE 0.9 % IJ SOLN
INTRAMUSCULAR | Status: DC | PRN
  Administered 2013-08-01: 14 mL

## 2013-08-01 MED ORDER — ONDANSETRON HCL 4 MG/2ML IJ SOLN
INTRAMUSCULAR | Status: DC | PRN
  Administered 2013-08-01: 4 mg via INTRAVENOUS

## 2013-08-01 MED ORDER — CHLORHEXIDINE GLUCONATE 4 % EX LIQD: 60.0000 mL | Freq: Once | CUTANEOUS | Status: DC

## 2013-08-01 MED ORDER — SODIUM CHLORIDE 0.9 % IV SOLN
INTRAVENOUS | Status: DC
  Administered 2013-08-01 – 2013-08-02 (×2): via INTRAVENOUS
  Filled 2013-08-01 (×5): qty 1000

## 2013-08-01 MED ORDER — VITAMIN B-12 250 MCG PO TABS: 250.0000 ug | ORAL_TABLET | Freq: Every day | ORAL | Status: DC

## 2013-08-01 MED ORDER — DIAZEPAM 5 MG PO TABS
2.5000 mg | ORAL_TABLET | Freq: Two times a day (BID) | ORAL | Status: DC | PRN
Start: 2013-08-01 — End: 2013-08-02

## 2013-08-01 MED ORDER — FENTANYL CITRATE 0.05 MG/ML IJ SOLN
25.0000 ug | INTRAMUSCULAR | Status: DC | PRN
  Administered 2013-08-01: 25 ug via INTRAVENOUS

## 2013-08-01 MED ORDER — KETOROLAC TROMETHAMINE 30 MG/ML IJ SOLN
INTRAMUSCULAR | Status: AC
  Filled 2013-08-01: qty 1

## 2013-08-01 MED ORDER — PROMETHAZINE HCL 25 MG/ML IJ SOLN: 6.2500 mg | INTRAMUSCULAR | Status: DC | PRN

## 2013-08-01 MED ORDER — MAGNESIUM OXIDE 400 MG PO TABS: 400.0000 mg | ORAL_TABLET | Freq: Every day | ORAL | Status: DC

## 2013-08-01 MED ORDER — GABAPENTIN 300 MG PO CAPS: 300.0000 mg | ORAL_CAPSULE | Freq: Every evening | ORAL | Status: DC | PRN

## 2013-08-01 MED ORDER — MIDAZOLAM HCL 5 MG/5ML IJ SOLN
INTRAMUSCULAR | Status: DC | PRN
  Administered 2013-08-01 (×2): 1 mg via INTRAVENOUS

## 2013-08-01 MED ORDER — STERILE WATER FOR IRRIGATION IR SOLN
Status: DC | PRN
  Administered 2013-08-01: 1500 mL

## 2013-08-01 MED ORDER — EPHEDRINE SULFATE 50 MG/ML IJ SOLN
INTRAMUSCULAR | Status: DC | PRN
  Administered 2013-08-01: 10 mg via INTRAVENOUS

## 2013-08-01 MED ORDER — DEXAMETHASONE SODIUM PHOSPHATE 10 MG/ML IJ SOLN
INTRAMUSCULAR | Status: AC
  Filled 2013-08-01: qty 1

## 2013-08-01 MED ORDER — ASPIRIN EC 325 MG PO TBEC
325.0000 mg | DELAYED_RELEASE_TABLET | Freq: Two times a day (BID) | ORAL | Status: DC
  Administered 2013-08-02: 325 mg via ORAL
  Filled 2013-08-01 (×3): qty 1

## 2013-08-01 MED ORDER — FENTANYL CITRATE 0.05 MG/ML IJ SOLN
INTRAMUSCULAR | Status: AC
  Filled 2013-08-01: qty 2

## 2013-08-01 MED ORDER — ONDANSETRON HCL 4 MG PO TABS
4.0000 mg | ORAL_TABLET | Freq: Four times a day (QID) | ORAL | Status: DC | PRN
  Administered 2013-08-02: 4 mg via ORAL
  Filled 2013-08-01: qty 1

## 2013-08-01 MED ORDER — LEVOTHYROXINE SODIUM 25 MCG PO TABS
25.0000 ug | ORAL_TABLET | Freq: Every day | ORAL | Status: DC
  Administered 2013-08-02: 25 ug via ORAL
  Filled 2013-08-01 (×2): qty 1

## 2013-08-01 MED ORDER — HYDROMORPHONE HCL PF 1 MG/ML IJ SOLN
0.5000 mg | INTRAMUSCULAR | Status: DC | PRN
  Administered 2013-08-01 (×2): 1 mg via INTRAVENOUS
  Filled 2013-08-01 (×2): qty 1

## 2013-08-01 MED ORDER — MENTHOL 3 MG MT LOZG: 1.0000 | LOZENGE | OROMUCOSAL | Status: DC | PRN

## 2013-08-01 MED ORDER — DEXAMETHASONE SODIUM PHOSPHATE 10 MG/ML IJ SOLN
10.0000 mg | Freq: Once | INTRAMUSCULAR | Status: AC
  Administered 2013-08-02: 10 mg via INTRAVENOUS
  Filled 2013-08-01: qty 1

## 2013-08-01 MED ORDER — BACID PO TABS
1.0000 | ORAL_TABLET | Freq: Every day | ORAL | Status: DC
  Administered 2013-08-02: 1 via ORAL
  Filled 2013-08-01: qty 1

## 2013-08-01 MED ORDER — FENTANYL CITRATE 0.05 MG/ML IJ SOLN
INTRAMUSCULAR | Status: DC | PRN
  Administered 2013-08-01: 50 ug via INTRAVENOUS

## 2013-08-01 MED ORDER — MIDAZOLAM HCL 2 MG/2ML IJ SOLN
INTRAMUSCULAR | Status: AC
  Filled 2013-08-01: qty 2

## 2013-08-01 MED ORDER — METOCLOPRAMIDE HCL 5 MG/ML IJ SOLN
5.0000 mg | Freq: Three times a day (TID) | INTRAMUSCULAR | Status: DC | PRN
  Administered 2013-08-01: 10 mg via INTRAVENOUS
  Filled 2013-08-01: qty 2

## 2013-08-01 MED ORDER — ONDANSETRON HCL 4 MG/2ML IJ SOLN
INTRAMUSCULAR | Status: AC
  Filled 2013-08-01: qty 2

## 2013-08-01 MED ORDER — MAGNESIUM OXIDE 400 (241.3 MG) MG PO TABS
400.0000 mg | ORAL_TABLET | Freq: Every day | ORAL | Status: DC
  Administered 2013-08-02: 400 mg via ORAL
  Filled 2013-08-01: qty 1

## 2013-08-01 MED ORDER — SODIUM CHLORIDE 0.9 % IV SOLN
1000.0000 mg | Freq: Once | INTRAVENOUS | Status: AC
  Administered 2013-08-01: 1000 mg via INTRAVENOUS
  Filled 2013-08-01: qty 10

## 2013-08-01 MED ORDER — BISACODYL 10 MG RE SUPP: 10.0000 mg | Freq: Every day | RECTAL | Status: DC | PRN

## 2013-08-01 MED ORDER — POLYETHYLENE GLYCOL 3350 17 G PO PACK
17.0000 g | PACK | Freq: Two times a day (BID) | ORAL | Status: DC
  Administered 2013-08-01 – 2013-08-02 (×2): 17 g via ORAL

## 2013-08-01 MED ORDER — CLINDAMYCIN PHOSPHATE 900 MG/50ML IV SOLN
INTRAVENOUS | Status: AC
  Filled 2013-08-01: qty 50

## 2013-08-01 MED ORDER — PROPOFOL INFUSION 10 MG/ML OPTIME
INTRAVENOUS | Status: DC | PRN
  Administered 2013-08-01: 140 ug/kg/min via INTRAVENOUS

## 2013-08-01 MED ORDER — BUPIVACAINE-EPINEPHRINE PF 0.25-1:200000 % IJ SOLN
INTRAMUSCULAR | Status: AC
  Filled 2013-08-01: qty 30

## 2013-08-01 MED ORDER — MEPERIDINE HCL 50 MG/ML IJ SOLN: 6.2500 mg | INTRAMUSCULAR | Status: DC | PRN

## 2013-08-01 MED ORDER — DOCUSATE SODIUM 100 MG PO CAPS
100.0000 mg | ORAL_CAPSULE | Freq: Two times a day (BID) | ORAL | Status: DC
  Administered 2013-08-01 – 2013-08-02 (×2): 100 mg via ORAL

## 2013-08-01 MED ORDER — CLINDAMYCIN PHOSPHATE 600 MG/50ML IV SOLN
600.0000 mg | Freq: Four times a day (QID) | INTRAVENOUS | Status: AC
  Administered 2013-08-01 (×2): 600 mg via INTRAVENOUS
  Filled 2013-08-01 (×2): qty 50

## 2013-08-01 MED ORDER — BUPIVACAINE IN DEXTROSE 0.75-8.25 % IT SOLN
INTRATHECAL | Status: DC | PRN
  Administered 2013-08-01: 1.5 mL via INTRATHECAL

## 2013-08-01 MED ORDER — BUPIVACAINE HCL (PF) 0.5 % IJ SOLN
INTRAMUSCULAR | Status: AC
  Filled 2013-08-01: qty 30

## 2013-08-01 MED ORDER — CYANOCOBALAMIN 250 MCG PO TABS
250.0000 ug | ORAL_TABLET | Freq: Every day | ORAL | Status: DC
  Administered 2013-08-02: 250 ug via ORAL
  Filled 2013-08-01: qty 1

## 2013-08-01 MED ORDER — KETOROLAC TROMETHAMINE 30 MG/ML IJ SOLN
INTRAMUSCULAR | Status: DC | PRN
  Administered 2013-08-01: 30 mg

## 2013-08-01 MED ORDER — PANTOPRAZOLE SODIUM 20 MG PO TBEC
20.0000 mg | DELAYED_RELEASE_TABLET | Freq: Every day | ORAL | Status: DC
  Filled 2013-08-01: qty 1

## 2013-08-01 MED ORDER — LACTATED RINGERS IV SOLN
INTRAVENOUS | Status: DC
  Administered 2013-08-01: 1000 mL via INTRAVENOUS
  Administered 2013-08-01: 10:00:00 via INTRAVENOUS

## 2013-08-01 MED ORDER — SODIUM CHLORIDE 0.9 % IJ SOLN
INTRAMUSCULAR | Status: AC
  Filled 2013-08-01: qty 50

## 2013-08-01 MED ORDER — ALUM & MAG HYDROXIDE-SIMETH 200-200-20 MG/5ML PO SUSP: 30.0000 mL | ORAL | Status: DC | PRN

## 2013-08-01 MED ORDER — CELECOXIB 200 MG PO CAPS
200.0000 mg | ORAL_CAPSULE | Freq: Two times a day (BID) | ORAL | Status: DC
  Administered 2013-08-01 – 2013-08-02 (×2): 200 mg via ORAL
  Filled 2013-08-01 (×3): qty 1

## 2013-08-01 MED ORDER — METOCLOPRAMIDE HCL 10 MG PO TABS: 5.0000 mg | ORAL_TABLET | Freq: Three times a day (TID) | ORAL | Status: DC | PRN

## 2013-08-01 MED ORDER — BUPIVACAINE-EPINEPHRINE (PF) 0.25% -1:200000 IJ SOLN
INTRAMUSCULAR | Status: DC | PRN
  Administered 2013-08-01: 25 mL

## 2013-08-01 MED ORDER — FERROUS SULFATE 325 (65 FE) MG PO TABS
325.0000 mg | ORAL_TABLET | Freq: Three times a day (TID) | ORAL | Status: DC
  Filled 2013-08-01 (×2): qty 1

## 2013-08-01 MED ORDER — PHENOL 1.4 % MT LIQD: 1.0000 | OROMUCOSAL | Status: DC | PRN

## 2013-08-01 MED ORDER — ACETAMINOPHEN 500 MG PO TABS: 1000.0000 mg | ORAL_TABLET | Freq: Four times a day (QID) | ORAL | Status: DC | PRN

## 2013-08-01 MED ORDER — BIOTIN 5000 MCG PO TABS: 1.0000 | ORAL_TABLET | Freq: Every day | ORAL | Status: DC

## 2013-08-01 MED ORDER — METHOCARBAMOL 500 MG PO TABS
500.0000 mg | ORAL_TABLET | Freq: Four times a day (QID) | ORAL | Status: DC | PRN
  Administered 2013-08-01 – 2013-08-02 (×2): 500 mg via ORAL
  Filled 2013-08-01 (×2): qty 1

## 2013-08-01 MED ORDER — FLEET ENEMA 7-19 GM/118ML RE ENEM: 1.0000 | ENEMA | Freq: Once | RECTAL | Status: AC | PRN

## 2013-08-01 MED ORDER — SODIUM CHLORIDE 0.9 % IJ SOLN
INTRAMUSCULAR | Status: AC
  Filled 2013-08-01: qty 20

## 2013-08-01 MED ORDER — DIPHENHYDRAMINE HCL 25 MG PO CAPS
25.0000 mg | ORAL_CAPSULE | Freq: Four times a day (QID) | ORAL | Status: DC | PRN
  Administered 2013-08-02: 25 mg via ORAL
  Filled 2013-08-01: qty 1

## 2013-08-01 MED ORDER — DEXAMETHASONE SODIUM PHOSPHATE 10 MG/ML IJ SOLN: 10.0000 mg | Freq: Once | INTRAMUSCULAR | Status: DC

## 2013-08-01 MED ORDER — SODIUM CHLORIDE 0.9 % IJ SOLN
INTRAMUSCULAR | Status: AC
  Filled 2013-08-01: qty 10

## 2013-08-01 MED ORDER — LORATADINE 10 MG PO TABS
10.0000 mg | ORAL_TABLET | Freq: Every day | ORAL | Status: DC
  Administered 2013-08-02: 10 mg via ORAL
  Filled 2013-08-01: qty 1

## 2013-08-01 MED ORDER — 0.9 % SODIUM CHLORIDE (POUR BTL) OPTIME
TOPICAL | Status: DC | PRN
  Administered 2013-08-01: 1000 mL

## 2013-08-01 MED ORDER — PROPOFOL 10 MG/ML IV BOLUS
INTRAVENOUS | Status: DC | PRN
  Administered 2013-08-01: 20 mg via INTRAVENOUS

## 2013-08-01 MED ORDER — EPHEDRINE SULFATE 50 MG/ML IJ SOLN
INTRAMUSCULAR | Status: AC
  Filled 2013-08-01: qty 1

## 2013-08-01 MED ORDER — HYDROMORPHONE HCL PF 1 MG/ML IJ SOLN
INTRAMUSCULAR | Status: AC
  Administered 2013-08-01: 0.5 mg
  Filled 2013-08-01: qty 1

## 2013-08-01 MED ORDER — ONDANSETRON HCL 4 MG/2ML IJ SOLN
4.0000 mg | Freq: Four times a day (QID) | INTRAMUSCULAR | Status: DC | PRN
  Administered 2013-08-01 (×2): 4 mg via INTRAVENOUS
  Filled 2013-08-01 (×2): qty 2

## 2013-08-01 MED ORDER — HYDROMORPHONE HCL 2 MG PO TABS
2.0000 mg | ORAL_TABLET | ORAL | Status: DC
  Administered 2013-08-01: 2 mg via ORAL
  Administered 2013-08-01: 4 mg via ORAL
  Administered 2013-08-01: 2 mg via ORAL
  Administered 2013-08-01 – 2013-08-02 (×2): 4 mg via ORAL
  Administered 2013-08-02: 2 mg via ORAL
  Administered 2013-08-02: 4 mg via ORAL
  Filled 2013-08-01: qty 1
  Filled 2013-08-01 (×6): qty 2
  Filled 2013-08-01: qty 1

## 2013-08-01 MED ORDER — METHOCARBAMOL 100 MG/ML IJ SOLN
500.0000 mg | Freq: Four times a day (QID) | INTRAMUSCULAR | Status: DC | PRN
  Administered 2013-08-01: 500 mg via INTRAVENOUS
  Filled 2013-08-01: qty 5

## 2013-08-01 SURGICAL SUPPLY — 55 items
BAG ZIPLOCK 12X15 (MISCELLANEOUS) ×3 IMPLANT
BANDAGE ELASTIC 6 VELCRO ST LF (GAUZE/BANDAGES/DRESSINGS) ×3 IMPLANT
BANDAGE ESMARK 6X9 LF (GAUZE/BANDAGES/DRESSINGS) ×1 IMPLANT
BLADE SAW SGTL 13.0X1.19X90.0M (BLADE) ×3 IMPLANT
BNDG ESMARK 6X9 LF (GAUZE/BANDAGES/DRESSINGS) ×3
BOWL SMART MIX CTS (DISPOSABLE) ×3 IMPLANT
CAPT RP KNEE ×3 IMPLANT
CEMENT HV SMART SET (Cement) ×6 IMPLANT
CUFF TOURN SGL QUICK 34 (TOURNIQUET CUFF) ×2
CUFF TRNQT CYL 34X4X40X1 (TOURNIQUET CUFF) ×1 IMPLANT
DECANTER SPIKE VIAL GLASS SM (MISCELLANEOUS) ×3 IMPLANT
DERMABOND ADVANCED (GAUZE/BANDAGES/DRESSINGS) ×2
DERMABOND ADVANCED .7 DNX12 (GAUZE/BANDAGES/DRESSINGS) ×1 IMPLANT
DRAPE EXTREMITY T 121X128X90 (DRAPE) ×3 IMPLANT
DRAPE POUCH INSTRU U-SHP 10X18 (DRAPES) ×3 IMPLANT
DRAPE U-SHAPE 47X51 STRL (DRAPES) ×3 IMPLANT
DRSG AQUACEL AG ADV 3.5X10 (GAUZE/BANDAGES/DRESSINGS) ×3 IMPLANT
DRSG TEGADERM 4X4.75 (GAUZE/BANDAGES/DRESSINGS) IMPLANT
DURAPREP 26ML APPLICATOR (WOUND CARE) ×6 IMPLANT
ELECT REM PT RETURN 9FT ADLT (ELECTROSURGICAL) ×3
ELECTRODE REM PT RTRN 9FT ADLT (ELECTROSURGICAL) ×1 IMPLANT
EVACUATOR 1/8 PVC DRAIN (DRAIN) IMPLANT
FACESHIELD LNG OPTICON STERILE (SAFETY) ×15 IMPLANT
GAUZE SPONGE 2X2 8PLY STRL LF (GAUZE/BANDAGES/DRESSINGS) IMPLANT
GLOVE BIOGEL PI IND STRL 7.5 (GLOVE) ×1 IMPLANT
GLOVE BIOGEL PI IND STRL 8 (GLOVE) ×1 IMPLANT
GLOVE BIOGEL PI INDICATOR 7.5 (GLOVE) ×2
GLOVE BIOGEL PI INDICATOR 8 (GLOVE) ×2
GLOVE ECLIPSE 8.0 STRL XLNG CF (GLOVE) ×3 IMPLANT
GLOVE ORTHO TXT STRL SZ7.5 (GLOVE) ×6 IMPLANT
GOWN SPEC L3 XXLG W/TWL (GOWN DISPOSABLE) ×3 IMPLANT
GOWN STRL REUS W/TWL LRG LVL3 (GOWN DISPOSABLE) ×3 IMPLANT
HANDPIECE INTERPULSE COAX TIP (DISPOSABLE) ×2
KIT BASIN OR (CUSTOM PROCEDURE TRAY) ×3 IMPLANT
MANIFOLD NEPTUNE II (INSTRUMENTS) ×3 IMPLANT
NDL SAFETY ECLIPSE 18X1.5 (NEEDLE) ×1 IMPLANT
NEEDLE HYPO 18GX1.5 SHARP (NEEDLE) ×2
NS IRRIG 1000ML POUR BTL (IV SOLUTION) ×3 IMPLANT
PACK TOTAL JOINT (CUSTOM PROCEDURE TRAY) ×3 IMPLANT
POSITIONER SURGICAL ARM (MISCELLANEOUS) ×3 IMPLANT
SET HNDPC FAN SPRY TIP SCT (DISPOSABLE) ×1 IMPLANT
SET PAD KNEE POSITIONER (MISCELLANEOUS) ×3 IMPLANT
SPONGE GAUZE 2X2 STER 10/PKG (GAUZE/BANDAGES/DRESSINGS)
SUCTION FRAZIER 12FR DISP (SUCTIONS) ×3 IMPLANT
SUT MNCRL AB 4-0 PS2 18 (SUTURE) ×3 IMPLANT
SUT VIC AB 1 CT1 36 (SUTURE) ×3 IMPLANT
SUT VIC AB 2-0 CT1 27 (SUTURE) ×6
SUT VIC AB 2-0 CT1 TAPERPNT 27 (SUTURE) ×3 IMPLANT
SUT VLOC 180 0 24IN GS25 (SUTURE) ×3 IMPLANT
SYR 50ML LL SCALE MARK (SYRINGE) ×3 IMPLANT
TOWEL OR 17X26 10 PK STRL BLUE (TOWEL DISPOSABLE) ×3 IMPLANT
TOWEL OR NON WOVEN STRL DISP B (DISPOSABLE) IMPLANT
TRAY FOLEY CATH 14FRSI W/METER (CATHETERS) ×3 IMPLANT
WATER STERILE IRR 1500ML POUR (IV SOLUTION) ×3 IMPLANT
WRAP KNEE MAXI GEL POST OP (GAUZE/BANDAGES/DRESSINGS) ×3 IMPLANT

## 2013-08-01 NOTE — Anesthesia Preprocedure Evaluation (Signed)
Anesthesia Evaluation  Patient identified by MRN, date of birth, ID band Patient awake    Reviewed: Allergy & Precautions, H&P , NPO status , Patient's Chart, lab work & pertinent test results  History of Anesthesia Complications (+) PONV and history of anesthetic complications  Airway Mallampati: II TM Distance: >3 FB Neck ROM: Full    Dental  (+) Dental Advisory Given, Teeth Intact, Caps   Pulmonary neg pulmonary ROS, former smoker,  breath sounds clear to auscultation        Cardiovascular negative cardio ROS  Rhythm:Regular Rate:Normal     Neuro/Psych  Headaches, negative psych ROS   GI/Hepatic Neg liver ROS, GERD-  Medicated,  Endo/Other  negative endocrine ROS  Renal/GU negative Renal ROS     Musculoskeletal  (+) Arthritis -, Osteoarthritis,    Abdominal   Peds  Hematology negative hematology ROS (+)   Anesthesia Other Findings   Reproductive/Obstetrics                           Anesthesia Physical  Anesthesia Plan  ASA: II  Anesthesia Plan: Spinal   Post-op Pain Management:    Induction:   Airway Management Planned: Simple Face Mask  Additional Equipment:   Intra-op Plan:   Post-operative Plan:   Informed Consent: I have reviewed the patients History and Physical, chart, labs and discussed the procedure including the risks, benefits and alternatives for the proposed anesthesia with the patient or authorized representative who has indicated his/her understanding and acceptance.   Dental advisory given  Plan Discussed with: CRNA  Anesthesia Plan Comments:         Anesthesia Quick Evaluation

## 2013-08-01 NOTE — Interval H&P Note (Signed)
History and Physical Interval Note:  08/01/2013 8:49 AM  Mariah Farrell  has presented today for surgery, with the diagnosis of LEFT KNEE OA  The various methods of treatment have been discussed with the patient and family. After consideration of risks, benefits and other options for treatment, the patient has consented to  Procedure(s): LEFT TOTAL KNEE ARTHROPLASTY (Left) as a surgical intervention .  The patient's history has been reviewed, patient examined, no change in status, stable for surgery.  I have reviewed the patient's chart and labs.  Questions were answered to the patient's satisfaction.     Mauri Pole

## 2013-08-01 NOTE — Preoperative (Signed)
Beta Blockers   Reason not to administer Beta Blockers:Not Applicable 

## 2013-08-01 NOTE — Anesthesia Postprocedure Evaluation (Signed)
  Anesthesia Post-op Note  Patient: Mariah Farrell  Procedure(s) Performed: Procedure(s) (LRB): LEFT TOTAL KNEE ARTHROPLASTY (Left)  Patient Location: PACU  Anesthesia Type: Spinal  Level of Consciousness: awake and alert   Airway and Oxygen Therapy: Patient Spontanous Breathing  Post-op Pain: mild  Post-op Assessment: Post-op Vital signs reviewed, Patient's Cardiovascular Status Stable, Respiratory Function Stable, Patent Airway and No signs of Nausea or vomiting  Last Vitals:  Filed Vitals:   08/01/13 1315  BP: 129/73  Pulse: 67  Temp: 36.4 C  Resp: 16    Post-op Vital Signs: stable   Complications: No apparent anesthesia complications

## 2013-08-01 NOTE — Anesthesia Procedure Notes (Signed)
Spinal  Patient location during procedure: OR Staffing Anesthesiologist: Maizee Reinhold Performed by: anesthesiologist  Preanesthetic Checklist Completed: patient identified, site marked, surgical consent, pre-op evaluation, timeout performed, IV checked, risks and benefits discussed and monitors and equipment checked Spinal Block Patient position: sitting Prep: Betadine Patient monitoring: heart rate, continuous pulse ox and blood pressure Approach: right paramedian Location: L3-4 Injection technique: single-shot Needle Needle type: Spinocan  Needle gauge: 22 G Needle length: 9 cm Additional Notes Expiration date of kit checked and confirmed. Patient tolerated procedure well, without complications.     

## 2013-08-01 NOTE — Evaluation (Signed)
Physical Therapy Evaluation Patient Details Name: Mariah Farrell MRN: [id number] DOB: 1940-06-02 Today's Date: 08/01/2013 Time: 4098-1191 PT Time Calculation (min): 24 min  PT Assessment / Plan / Recommendation History of Present Illness  LTKA  Clinical Impression  Pt reports  Superior knee is more painful this surgery. Ambulated x 80'. Plans DC tomorrow. Pt will benefit dfrom PT to address listed problems.    PT Assessment  Patient needs continued PT services    Follow Up Recommendations  Home health PT    Does the patient have the potential to tolerate intense rehabilitation      Barriers to Discharge        Equipment Recommendations  None recommended by PT    Recommendations for Other Services     Frequency 7X/week    Precautions / Restrictions Precautions Precautions: Fall;Knee Restrictions Weight Bearing Restrictions: No   Pertinent Vitals/Pain 2-5 L knee      Mobility  Bed Mobility Overal bed mobility: Needs Assistance Bed Mobility: Supine to Sit Supine to sit: Supervision General bed mobility comments: self supports L leg, no support needed. Transfers Equipment used: Rolling walker (2 wheeled) Ambulation/Gait Ambulation/Gait assistance: Min guard Ambulation Distance (Feet): 140 Feet Assistive device: Rolling walker (2 wheeled) Gait Pattern/deviations: Step-to pattern;Antalgic General Gait Details: cues for sequence    Exercises Total Joint Exercises Ankle Circles/Pumps: AROM;Left;10 reps;Supine Quad Sets: AROM;Left;10 reps;Supine Goniometric ROM: 10-80 degr. L knee   PT Diagnosis: Difficulty walking;Acute pain  PT Problem List: Decreased strength;Decreased range of motion;Decreased activity tolerance;Decreased mobility;Pain;Decreased knowledge of precautions;Decreased safety awareness;Decreased knowledge of use of DME PT Treatment Interventions: DME instruction;Gait training;Stair training;Functional mobility training;Therapeutic  activities;Therapeutic exercise;Patient/family education     PT Goals(Current goals can be found in the care plan section) Acute Rehab PT Goals Patient Stated Goal: to go home tomorrow PT Goal Formulation: With patient Time For Goal Achievement: 08/03/13 Potential to Achieve Goals: Good  Visit Information  Last PT Received On: 08/01/13 Assistance Needed: +1 History of Present Illness: LTKA       Prior [place] expects to be discharged to:: Private residence Living Arrangements: Spouse/significant other Available Help at Discharge: Family Type of Home: House Home Access: Stairs to enter Technical brewer of Steps: 2 Prior Function Level of Independence: Independent Communication Communication: No difficulties    Cognition  Cognition Arousal/Alertness: Awake/alert Behavior During Therapy: WFL for tasks assessed/performed Overall Cognitive Status: Within Functional Limits for tasks assessed    Extremity/Trunk Assessment Upper Extremity Assessment Upper Extremity Assessment: Overall WFL for tasks assessed Lower Extremity Assessment Lower Extremity Assessment: LLE deficits/detail LLE Deficits / Details: able to SLR, flex knee to  80*   Balance    End of Session PT - End of Session Activity Tolerance: Patient tolerated treatment well Patient left: in chair;with call bell/phone within reach;with family/visitor present Nurse Communication: Mobility status  GP     [name] 08/01/2013, 4:08 PM

## 2013-08-01 NOTE — Transfer of Care (Signed)
Immediate Anesthesia Transfer of Care Note  Patient: Mariah Farrell  Procedure(s) Performed: Procedure(s): LEFT TOTAL KNEE ARTHROPLASTY (Left)  Patient Location: PACU  Anesthesia Type:Spinal  Level of Consciousness: awake, alert , oriented and patient cooperative  Airway & Oxygen Therapy: Patient Spontanous Breathing and Patient connected to face mask oxygen  Post-op Assessment: Report given to PACU RN and Post -op Vital signs reviewed and stable  Post vital signs: Reviewed and stable  Complications: No apparent anesthesia complications

## 2013-08-01 NOTE — Op Note (Signed)
NAME:  Mariah Farrell                      MEDICAL RECORD NO.:  [id number]                             FACILITY:  [facility]      PHYSICIAN:  Pietro Cassis. Alvan Dame, M.D.  DATE OF BIRTH:  04/27/1941      DATE OF PROCEDURE:  08/01/2013                                     OPERATIVE REPORT         PREOPERATIVE DIAGNOSIS:  Left knee osteoarthritis.      POSTOPERATIVE DIAGNOSIS:  Left knee osteoarthritis.      FINDINGS:  The patient was noted to have complete loss of cartilage and   bone-on-bone arthritis with associated osteophytes in the medial and patellofemoral compartments of   the knee with a significant synovitis and associated effusion.      PROCEDURE:  Left total knee replacement.      COMPONENTS USED:  DePuy rotating platform posterior stabilized knee   system, a size 3 femur, 3 tibia, 10 mm PS insert, and 38 patellar   button.      SURGEON:  Pietro Cassis. Alvan Dame, M.D.      ASSISTANT:  Danae Orleans, PA-C.      ANESTHESIA:  Spinal.      SPECIMENS:  None.      COMPLICATION:  None.      DRAINS:  None.  EBL: <100cc      TOURNIQUET TIME:   Total Tourniquet Time Documented: Thigh (Left) - 33 minutes Total: Thigh (Left) - 33 minutes.      The patient was stable to the recovery room.      INDICATION FOR PROCEDURE:  Mariah Farrell is a 73 y.o. female patient of   mine.  The patient had been seen, evaluated, and treated conservatively in the   office with medication, activity modification, and injections.  The patient had   radiographic changes of bone-on-bone arthritis with endplate sclerosis and osteophytes noted.      The patient failed conservative measures including medication, injections, and activity modification, and at this point was ready for more definitive measures.   Based on the radiographic changes and failed conservative measures, the patient   decided to proceed with total knee replacement.  Risks of infection,   DVT, component failure, need for revision  surgery, postop course, and   expectations were all   discussed and reviewed.  Consent was obtained for benefit of pain   relief.      PROCEDURE IN DETAIL:  The patient was brought to the operative theater.   Once adequate anesthesia, preoperative antibiotics, 900mg  of Cleocin administered, the patient was positioned supine with the left thigh tourniquet placed.  The  left lower extremity was prepped and draped in sterile fashion.  A time-   out was performed identifying the patient, planned procedure, and   extremity.      The left lower extremity was placed in the [facility] leg holder.  The leg was   exsanguinated, tourniquet elevated to 250 mmHg.  A midline incision was   made followed by median parapatellar arthrotomy.  Following initial   exposure, attention was first directed to the  patella.  Precut   measurement was noted to be 21 mm.  I resected down to 14 mm and used a   38 patellar button to restore patellar height as well as cover the cut   surface.      The lug holes were drilled and a metal shim was placed to protect the   patella from retractors and saw blades.      At this point, attention was now directed to the femur.  The femoral   canal was opened with a drill, irrigated to try to prevent fat emboli.  An   intramedullary rod was passed at 3 degrees valgus, 9 mm of bone was   resected off the distal femur.  Following this resection, the tibia was   subluxated anteriorly.  Using the extramedullary guide, 10 mm of bone was resected off   the proximal lateral tibia.  We confirmed the gap would be   stable medially and laterally with a 10 mm insert as well as confirmed   the cut was perpendicular in the coronal plane, checking with an alignment rod.      Once this was done, I sized the femur to be a size 3 in the anterior-   posterior dimension, chose a standard component based on medial and   lateral dimension.  The size 3 rotation block was then pinned in   position  anterior referenced using the C-clamp to set rotation.  The   anterior, posterior, and  chamfer cuts were made without difficulty nor   notching making certain that I was along the anterior cortex to help   with flexion gap stability.      The final box cut was made off the lateral aspect of distal femur.      At this point, the tibia was sized to be a size 3, the size 3 tray was   then pinned in position through the medial third of the tubercle,   drilled, and keel punched.  Trial reduction was now carried with a 3 femur,  3 tibia, a 10 mm insert, and the 38 patella botton.  The knee was brought to   extension, full extension with good flexion stability with the patella   tracking through the trochlea without application of pressure.  Given   all these findings, the trial components removed.  Final components were   opened and cement was mixed.  The knee was irrigated with normal saline   solution and pulse lavage.  The synovial lining was   then injected with 20cc of Exparel, 30cc of0.25% Marcaine with epinephrine and 1 cc of Toradol,   total of 61 cc.      The knee was irrigated.  Final implants were then cemented onto clean and   dried cut surfaces of bone with the knee brought to extension with a 41mm trial insert.      Once the cement had fully cured, the excess cement was removed   throughout the knee.  I confirmed I was satisfied with the range of   motion and stability, and the final 10 mm PS insert was chosen.  It was   placed into the knee.      The tourniquet had been let down at 33 minutes.  No significant   hemostasis required.  The   extensor mechanism was then reapproximated using #1 Vicryl and #0 V-lock sutures with the knee   in flexion.  The   remaining wound was closed  with 2-0 Vicryl and running 4-0 Monocryl.   The knee was cleaned, dried, dressed sterilely using Dermabond and   Aquacel dressing.  The patient was then   brought to recovery room in stable  condition, tolerating the procedure   well.   Please note that Physician Assistant, Danae Orleans, PA-C, was present for the entirety of the case, and was utilized for pre-operative positioning, peri-operative retractor management, general facilitation of the procedure.  He was also utilized for primary wound closure at the end of the case.              Pietro Cassis Alvan Dame, M.D.    08/01/2013 11:16 AM

## 2013-08-02 DIAGNOSIS — E669 Obesity, unspecified: Secondary | ICD-10-CM | POA: Diagnosis present

## 2013-08-02 LAB — BASIC METABOLIC PANEL
BUN: 21 mg/dL (ref 6–23)
CALCIUM: 8.7 mg/dL (ref 8.4–10.5)
CO2: 24 mEq/L (ref 19–32)
Chloride: 103 mEq/L (ref 96–112)
Creatinine, Ser: 0.83 mg/dL (ref 0.50–1.10)
GFR, EST AFRICAN AMERICAN: 79 mL/min — AB (ref >90)
GFR, EST NON AFRICAN AMERICAN: 68 mL/min — AB (ref >90)
GLUCOSE: 123 mg/dL — AB (ref 70–99)
POTASSIUM: 4.6 meq/L (ref 3.7–5.3)
Sodium: 139 mEq/L (ref 137–147)

## 2013-08-02 LAB — CBC
HCT: 36.2 % (ref 36.0–46.0)
Hemoglobin: 11.9 g/dL — ABNORMAL LOW (ref 12.0–15.0)
MCH: 32.1 pg (ref 26.0–34.0)
MCHC: 32.9 g/dL (ref 30.0–36.0)
MCV: 97.6 fL (ref 78.0–100.0)
PLATELETS: 274 10*3/uL (ref 150–400)
RBC: 3.71 MIL/uL — ABNORMAL LOW (ref 3.87–5.11)
RDW: 13.1 % (ref 11.5–15.5)
WBC: 12 10*3/uL — ABNORMAL HIGH (ref 4.0–10.5)

## 2013-08-02 MED ORDER — HYDROCODONE-ACETAMINOPHEN 7.5-325 MG PO TABS: 1.0000 | ORAL_TABLET | ORAL | Status: DC | PRN

## 2013-08-02 MED ORDER — DSS 100 MG PO CAPS: 100.0000 mg | ORAL_CAPSULE | Freq: Two times a day (BID) | ORAL | Status: DC

## 2013-08-02 MED ORDER — TEMAZEPAM 7.5 MG PO CAPS: 15.0000 mg | ORAL_CAPSULE | Freq: Every evening | ORAL | Status: DC | PRN

## 2013-08-02 MED ORDER — TIZANIDINE HCL 4 MG PO TABS: 4.0000 mg | ORAL_TABLET | Freq: Four times a day (QID) | ORAL | Status: DC | PRN

## 2013-08-02 MED ORDER — TRAMADOL HCL 50 MG PO TABS: 50.0000 mg | ORAL_TABLET | Freq: Four times a day (QID) | ORAL | Status: DC | PRN

## 2013-08-02 MED ORDER — POLYETHYLENE GLYCOL 3350 17 G PO PACK: 17.0000 g | PACK | Freq: Two times a day (BID) | ORAL | Status: DC

## 2013-08-02 MED ORDER — KETOROLAC TROMETHAMINE 10 MG PO TABS: 10.0000 mg | ORAL_TABLET | Freq: Four times a day (QID) | ORAL | Status: DC | PRN

## 2013-08-02 MED ORDER — NON FORMULARY: 15.0000 mg | Freq: Every day | Status: DC

## 2013-08-02 MED ORDER — LANSOPRAZOLE 15 MG PO CPDR
15.0000 mg | DELAYED_RELEASE_CAPSULE | Freq: Every day | ORAL | Status: DC
  Administered 2013-08-02: 15 mg via ORAL
  Filled 2013-08-02: qty 1

## 2013-08-02 MED ORDER — ASPIRIN 325 MG PO TBEC: 325.0000 mg | DELAYED_RELEASE_TABLET | Freq: Two times a day (BID) | ORAL | Status: DC

## 2013-08-02 NOTE — Progress Notes (Signed)
Physical Therapy Treatment Patient Details Name: Mariah Farrell MRN: [id number] DOB: [birth year] Today's Date: 08/02/2013 Time: 2229-7989 PT Time Calculation (min): 35 min  PT Assessment / Plan / Recommendation  History of Present Illness LTKA   PT Comments   Pt tolerated well, will practice steps and Dc.   Follow Up Recommendations  Home health PT     Does the patient have the potential to tolerate intense rehabilitation     Barriers to Discharge        Equipment Recommendations  None recommended by PT    Recommendations for Other Services    Frequency 7X/week   Progress towards PT Goals Progress towards PT goals: Progressing toward goals  Plan Current plan remains appropriate    Precautions / Restrictions Precautions Precautions: Fall;Knee   Pertinent Vitals/Pain Sore     Mobility  Bed Mobility Overal bed mobility: Needs Assistance General bed mobility comments: self supports L leg, no support needed. Transfers Overall transfer level: Needs assistance Equipment used: Rolling walker (2 wheeled) Transfers: Sit to/from Stand Sit to Stand: Supervision General transfer comment: from bed and from [facility] Ambulation/Gait Ambulation/Gait assistance: Supervision Ambulation Distance (Feet): 150 Feet Assistive device: Rolling walker (2 wheeled) Gait Pattern/deviations: Step-through pattern General Gait Details: cues for sequence    Exercises Total Joint Exercises Quad Sets: AROM;Left;Supine;10 reps Straight Leg Raises: AROM;Left;10 reps;Supine Long Arc Quad: AROM;Left;10 reps;Supine Goniometric ROM: 0-105   PT Diagnosis:    PT Problem List:   PT Treatment Interventions:     PT Goals (current goals can now be found in the care plan section)    Visit Information  Last PT Received On: 08/02/13 Assistance Needed: +1 History of Present Illness: LTKA    Subjective Data      Cognition  Cognition Arousal/Alertness: Awake/alert    Balance     End of Session PT  - End of Session Activity Tolerance: Patient tolerated treatment well Patient left: in chair;with call bell/phone within reach Nurse Communication: Mobility status   GP     Mariah Farrell 08/02/2013, 1:16 PM

## 2013-08-02 NOTE — Progress Notes (Signed)
[facility] is providing the following services: No equipment needs - Patient has rw and commode at home.  If patient discharges after hours, please call [phone].   Linward Headland 08/02/2013, 9:47 AM

## 2013-08-02 NOTE — Progress Notes (Signed)
OT Cancellation Note  Patient Details Name: Mariah Farrell MRN: [id number] DOB: January 01, 1941   Cancelled Treatment:    Reason Eval/Treat Not Completed: PT screened, no needs identified, will sign off.  Pt had R TKA last year.  Aleese Kamps 08/02/2013, 7:49 AM Lesle Chris, OTR/L [phone] 08/02/2013

## 2013-08-02 NOTE — Progress Notes (Signed)
Physical Therapy Treatment Patient Details Name: Mariah Farrell MRN: [id number] DOB: 30-May-1941 Today's Date: 08/02/2013 Time: 0037-0488 PT Time Calculation (min): 14 min  PT Assessment / Plan / Recommendation  History of Present Illness LTKA   PT Comments   Ready for DC/  Follow Up Recommendations  Home health PT     Does the patient have the potential to tolerate intense rehabilitation     Barriers to Discharge        Equipment Recommendations  None recommended by PT    Recommendations for Other Services    Frequency 7X/week   Progress towards PT Goals Progress towards PT goals: Progressing toward goals  Plan Current plan remains appropriate    Precautions / Restrictions Precautions Precautions: Fall;Knee   Pertinent Vitals/Pain No pain    Mobility  Bed Mobility Overal bed mobility: Needs Assistance General bed mobility comments: self supports L leg, no support needed. Transfers Overall transfer level: Needs assistance Equipment used: Rolling walker (2 wheeled) Transfers: Sit to/from Stand Sit to Stand: Supervision General transfer comment: from bed and from [facility] Ambulation/Gait Ambulation/Gait assistance: Supervision Ambulation Distance (Feet): 40 Feet Assistive device: Rolling walker (2 wheeled) Gait Pattern/deviations: Step-through pattern General Gait Details: cues for sequence Stairs: Yes Stairs assistance: Min assist Stair Management: Backwards;With walker Number of Stairs: 2    Exercises    PT Diagnosis:    PT Problem List:   PT Treatment Interventions:     PT Goals (current goals can now be found in the care plan section)    Visit Information  Last PT Received On: 08/02/13 Assistance Needed: +1 History of Present Illness: LTKA    Subjective Data      Cognition  Cognition Arousal/Alertness: Awake/alert    Balance     End of Session PT - End of Session Activity Tolerance: Patient tolerated treatment well Patient left: in  chair;with call bell/phone within reach Nurse Communication: Mobility status   GP     [name] 08/02/2013, 1:19 PM

## 2013-08-02 NOTE — Progress Notes (Signed)
   Subjective: 1 Day Post-Op Procedure(s) (LRB): LEFT TOTAL KNEE ARTHROPLASTY (Left)   Patient reports pain as mild, pain controlled. No events throughout the night. Ready to be discharged home if pain is controlled and does well with PT.  Objective:   VITALS:   Filed Vitals:   08/02/13 0455  BP: 112/64  Pulse: 65  Temp: 97.4 F (36.3 C)  Resp: 16    Neurovascular intact Dorsiflexion/Plantar flexion intact Incision: dressing C/D/I No cellulitis present Compartment soft  LABS  Recent Labs  08/02/13 0353  HGB 11.9*  HCT 36.2  WBC 12.0*  PLT 274     Recent Labs  08/02/13 0353  NA 139  K 4.6  BUN 21  CREATININE 0.83  GLUCOSE 123*     Assessment/Plan: 1 Day Post-Op Procedure(s) (LRB): LEFT TOTAL KNEE ARTHROPLASTY (Left) Foley cath d/c'ed ACE bandage d/c'ed Advance diet Up with therapy D/C IV fluids Discharge home with home health Follow up in 2 weeks at [facility]. Follow up with OLIN,Briar Sword D in 2 weeks.  Contact information:  [facility] [address], [place] [phone]     Obese (BMI 30-39.9) Estimated body mass index is 33.46 kg/(m^2) as calculated from the following:   Height as of this encounter: 5\' 2"  (1.575 m).   Weight as of this encounter: 83.008 kg (183 lb). Patient also counseled that weight may inhibit the healing process Patient counseled that losing weight will help with future health issues        West Pugh. Loleta Frommelt   PAC  08/02/2013, 8:22 AM

## 2013-08-02 NOTE — Progress Notes (Signed)
Utilization review completed.  

## 2013-08-02 NOTE — Progress Notes (Signed)
   CARE MANAGEMENT NOTE 08/02/2013  Patient:  CAROLENA, [employer]IRBANK   Account Number:  [id number]  Date Initiated:  08/02/2013  Documentation initiated by:  [facility]  Subjective/Objective Assessment:   LEFT TOTAL KNEE ARTHROPLASTY     Action/Plan:   [place]   Anticipated DC Date:  08/02/2013   Anticipated DC Plan:  [place]  CM consult      [facility] [place] [facility]  HOME HEALTH   Choice offered to / List presented to:  C-1 Patient        [place] arranged  HH-2 PT      [place]   Status of service:  Completed, signed off Medicare Important Message given?   (If response is "NO", the following Medicare IM given date fields will be blank) Date Medicare IM given:   Date Additional Medicare IM given:    Discharge Disposition:  [place]  Per UR Regulation:    If discussed at Long Length of Stay Meetings, dates discussed:    Comments:  08/02/2013 1120 NCM spoke to pt and offered choice for [facility]. Pt agreeable to [facility] for [facility]. No DME needed. Has RW and 3n1 at home. Jonnie Finner RN CCM Case Mgmt phone [phone]

## 2013-08-03 DIAGNOSIS — Z471 Aftercare following joint replacement surgery: Secondary | ICD-10-CM | POA: Diagnosis not present

## 2013-08-03 DIAGNOSIS — IMO0001 Reserved for inherently not codable concepts without codable children: Secondary | ICD-10-CM | POA: Diagnosis not present

## 2013-08-03 DIAGNOSIS — Z96659 Presence of unspecified artificial knee joint: Secondary | ICD-10-CM | POA: Diagnosis not present

## 2013-08-03 NOTE — Addendum Note (Signed)
Addendum created 08/03/13 1208 by Montez Hageman, MD   Modules edited: Anesthesia Responsible Staff

## 2013-08-04 DIAGNOSIS — IMO0001 Reserved for inherently not codable concepts without codable children: Secondary | ICD-10-CM | POA: Diagnosis not present

## 2013-08-04 DIAGNOSIS — Z471 Aftercare following joint replacement surgery: Secondary | ICD-10-CM | POA: Diagnosis not present

## 2013-08-04 DIAGNOSIS — Z96659 Presence of unspecified artificial knee joint: Secondary | ICD-10-CM | POA: Diagnosis not present

## 2013-08-04 NOTE — Discharge Summary (Signed)
Physician Discharge Summary  Patient ID: Mariah Farrell MRN: [id number] DOB/AGE: May 09, 1941 73 y.o.  Admit date: 08/01/2013 Discharge date: 08/02/2013   Procedures:  Procedure(s) (LRB): LEFT TOTAL KNEE ARTHROPLASTY (Left)  Attending Physician:  Dr. Paralee Cancel   Admission Diagnoses:   Left knee OA / pain  Discharge Diagnoses:  Principal Problem:   S/P left TKA Active Problems:   Obese  Past Medical History  Diagnosis Date  . Goiter     synthroid used to shrink  . GERD (gastroesophageal reflux disease)   . Arthritis   . PONV (postoperative nausea and vomiting)   . H/O hiatal hernia   . Headache(784.0)     migraines in morning  . Cancer 13 years ago    bladder    HPI: Mariah Farrell, 73 y.o. female, has a history of pain and functional disability in the left knee due to arthritis and has failed non-surgical conservative treatments for greater than 12 weeks to includeNSAID's and/or analgesics, corticosteriod injections, viscosupplementation injections, use of assistive devices and activity modification. Onset of symptoms was gradual, starting years ago with gradually worsening course since that time. The patient noted prior procedures on the knee to include arthroplasty on the right knee(s). Patient currently rates pain in the left knee(s) at 8 out of 10 with activity. Patient has night pain, worsening of pain with activity and weight bearing, pain that interferes with activities of daily living, pain with passive range of motion, crepitus and joint swelling. Patient has evidence of periarticular osteophytes and joint space narrowing by imaging studies. There is no active infection. Risks, benefits and expectations were discussed with the patient. Risks including but not limited to the risk of anesthesia, blood clots, nerve damage, blood vessel damage, failure of the prosthesis, infection and up to and including death. Patient understand the risks, benefits and expectations and  wishes to proceed with surgery.   PCP: Walker Kehr, MD   Discharged Condition: good  Hospital Course:  Patient underwent the above stated procedure on 08/01/2013. Patient tolerated the procedure well and brought to the recovery room in good condition and subsequently to the floor.  POD #1 BP: 112/64 ; Pulse: 65 ; Temp: 97.4 F (36.3 C) ; Resp: 16  Patient reports pain as mild, pain controlled. No events throughout the night. Ready to be discharged home. Neurovascular intact, dorsiflexion/plantar flexion intact, incision: dressing C/D/I, no cellulitis present and compartment soft.   LABS  Basename    HGB  11.9  HCT  36.2    Discharge Exam: General appearance: alert, cooperative and no distress Extremities: Homans sign is negative, no sign of DVT, no edema, redness or tenderness in the calves or thighs and no ulcers, gangrene or trophic changes  Disposition:   Home-Health Care Svc with follow up in 2 weeks   Follow-up Information   Follow up with Mauri Pole, MD. Schedule an appointment as soon as possible for a visit in 2 weeks.   Specialty:  Orthopedic Surgery   Contact information:   [address] [place] [address] [phone]       Follow up with [facility]. [facility])    Contact information:   [address] [place] [address] [phone]           Medication List    STOP taking these medications       acetaminophen 500 MG tablet  Commonly known as:  TYLENOL  TAKE these medications       aspirin 325 MG EC tablet  Take 1 tablet (325 mg total) by mouth 2 (two) times daily.     Biotin 5000 MCG Tabs  Take 1 tablet by mouth daily.     calcium-vitamin D 500-200 MG-UNIT per tablet  Commonly known as:  OSCAL WITH D  Take 1 tablet by mouth daily.     celecoxib 200 MG capsule  Commonly known as:  CELEBREX  Take 200 mg by mouth daily.     cetirizine 10 MG tablet  Commonly known  as:  ZYRTEC  Take 10 mg by mouth daily.     diazepam 5 MG tablet  Commonly known as:  VALIUM  Take 2.5-5 mg by mouth every 12 (twelve) hours as needed for anxiety or sedation.     DSS 100 MG Caps  Take 100 mg by mouth 2 (two) times daily.     gabapentin 300 MG capsule  Commonly known as:  NEURONTIN  Take 300 mg by mouth at bedtime as needed. Back pain     glucosamine-chondroitin 500-400 MG tablet  Take 1 tablet by mouth daily.     HYDROcodone-acetaminophen 7.5-325 MG per tablet  Commonly known as:  NORCO  Take 1-2 tablets by mouth every 4 (four) hours as needed for moderate pain.     ketorolac 10 MG tablet  Commonly known as:  TORADOL  Take 1 tablet (10 mg total) by mouth every 6 (six) hours as needed.     lactobacillus acidophilus Tabs tablet  Take 1 tablet by mouth daily.     lansoprazole 15 MG capsule  Commonly known as:  PREVACID  Take 15 mg by mouth daily.     levothyroxine 25 MCG tablet  Commonly known as:  SYNTHROID, LEVOTHROID  Take 25 mcg by mouth daily before breakfast.     magnesium oxide 400 MG tablet  Commonly known as:  MAG-OX  Take 400 mg by mouth daily.     Melatonin 3 MG Tabs  Take 1 tablet by mouth at bedtime as needed. sleep     multivitamin tablet  Take 1 tablet by mouth daily.     polyethylene glycol packet  Commonly known as:  MIRALAX / GLYCOLAX  Take 17 g by mouth 2 (two) times daily.     temazepam 7.5 MG capsule  Commonly known as:  RESTORIL  Take 2 capsules (15 mg total) by mouth at bedtime as needed for sleep.     tiZANidine 4 MG tablet  Commonly known as:  ZANAFLEX  Take 1 tablet (4 mg total) by mouth every 6 (six) hours as needed for muscle spasms.     traMADol 50 MG tablet  Commonly known as:  ULTRAM  Take 1-2 tablets (50-100 mg total) by mouth every 6 (six) hours as needed.     vitamin B-12 250 MCG tablet  Commonly known as:  CYANOCOBALAMIN  Take 250 mcg by mouth daily.     vitamin C 500 MG tablet  Commonly known as:   ASCORBIC ACID  Take 1,000 mg by mouth daily.         Signed: West Pugh. Cecile Gillispie   PAC  08/04/2013, 10:15 AM

## 2013-08-07 DIAGNOSIS — Z96659 Presence of unspecified artificial knee joint: Secondary | ICD-10-CM | POA: Diagnosis not present

## 2013-08-07 DIAGNOSIS — Z471 Aftercare following joint replacement surgery: Secondary | ICD-10-CM | POA: Diagnosis not present

## 2013-08-07 DIAGNOSIS — IMO0001 Reserved for inherently not codable concepts without codable children: Secondary | ICD-10-CM | POA: Diagnosis not present

## 2013-08-09 DIAGNOSIS — Z471 Aftercare following joint replacement surgery: Secondary | ICD-10-CM | POA: Diagnosis not present

## 2013-08-09 DIAGNOSIS — IMO0001 Reserved for inherently not codable concepts without codable children: Secondary | ICD-10-CM | POA: Diagnosis not present

## 2013-08-09 DIAGNOSIS — Z96659 Presence of unspecified artificial knee joint: Secondary | ICD-10-CM | POA: Diagnosis not present

## 2013-08-11 DIAGNOSIS — Z471 Aftercare following joint replacement surgery: Secondary | ICD-10-CM | POA: Diagnosis not present

## 2013-08-11 DIAGNOSIS — IMO0001 Reserved for inherently not codable concepts without codable children: Secondary | ICD-10-CM | POA: Diagnosis not present

## 2013-08-11 DIAGNOSIS — Z96659 Presence of unspecified artificial knee joint: Secondary | ICD-10-CM | POA: Diagnosis not present

## 2013-08-14 ENCOUNTER — Other Ambulatory Visit (HOSPITAL_COMMUNITY): Payer: Self-pay | Admitting: Physician Assistant

## 2013-08-14 ENCOUNTER — Ambulatory Visit (HOSPITAL_COMMUNITY)
Admission: RE | Admit: 2013-08-14 | Discharge: 2013-08-14 | Disposition: A | Discharge status: Home / Self Care | Payer: Medicare Other | Source: Ambulatory Visit | Attending: Cardiovascular Disease | Admitting: Cardiovascular Disease

## 2013-08-14 DIAGNOSIS — M79609 Pain in unspecified limb: Secondary | ICD-10-CM | POA: Diagnosis not present

## 2013-08-14 DIAGNOSIS — Z471 Aftercare following joint replacement surgery: Secondary | ICD-10-CM | POA: Diagnosis not present

## 2013-08-14 DIAGNOSIS — M7989 Other specified soft tissue disorders: Secondary | ICD-10-CM | POA: Diagnosis not present

## 2013-08-14 DIAGNOSIS — IMO0001 Reserved for inherently not codable concepts without codable children: Secondary | ICD-10-CM | POA: Diagnosis not present

## 2013-08-14 DIAGNOSIS — Z96659 Presence of unspecified artificial knee joint: Secondary | ICD-10-CM

## 2013-08-14 NOTE — Progress Notes (Signed)
Left Lower Ext. Venous Completed. Negative for DVT. Seana Underwood, BS, RDMS, RVT  

## 2013-08-16 DIAGNOSIS — Z96659 Presence of unspecified artificial knee joint: Secondary | ICD-10-CM | POA: Diagnosis not present

## 2013-08-16 DIAGNOSIS — IMO0001 Reserved for inherently not codable concepts without codable children: Secondary | ICD-10-CM | POA: Diagnosis not present

## 2013-08-16 DIAGNOSIS — Z471 Aftercare following joint replacement surgery: Secondary | ICD-10-CM | POA: Diagnosis not present

## 2013-08-18 DIAGNOSIS — M1710 Unilateral primary osteoarthritis, unspecified knee: Secondary | ICD-10-CM | POA: Diagnosis not present

## 2013-08-21 DIAGNOSIS — H40059 Ocular hypertension, unspecified eye: Secondary | ICD-10-CM | POA: Diagnosis not present

## 2013-08-22 DIAGNOSIS — M1710 Unilateral primary osteoarthritis, unspecified knee: Secondary | ICD-10-CM | POA: Diagnosis not present

## 2013-08-24 DIAGNOSIS — M1710 Unilateral primary osteoarthritis, unspecified knee: Secondary | ICD-10-CM | POA: Diagnosis not present

## 2013-08-29 DIAGNOSIS — M1710 Unilateral primary osteoarthritis, unspecified knee: Secondary | ICD-10-CM | POA: Diagnosis not present

## 2013-08-31 DIAGNOSIS — M1710 Unilateral primary osteoarthritis, unspecified knee: Secondary | ICD-10-CM | POA: Diagnosis not present

## 2013-09-05 DIAGNOSIS — M1710 Unilateral primary osteoarthritis, unspecified knee: Secondary | ICD-10-CM | POA: Diagnosis not present

## 2013-09-07 DIAGNOSIS — M1710 Unilateral primary osteoarthritis, unspecified knee: Secondary | ICD-10-CM | POA: Diagnosis not present

## 2013-09-12 DIAGNOSIS — M961 Postlaminectomy syndrome, not elsewhere classified: Secondary | ICD-10-CM | POA: Diagnosis not present

## 2013-09-29 DIAGNOSIS — Z96659 Presence of unspecified artificial knee joint: Secondary | ICD-10-CM | POA: Diagnosis not present

## 2013-11-01 DIAGNOSIS — M4310 Spondylolisthesis, site unspecified: Secondary | ICD-10-CM | POA: Diagnosis not present

## 2013-11-01 DIAGNOSIS — M961 Postlaminectomy syndrome, not elsewhere classified: Secondary | ICD-10-CM | POA: Diagnosis not present

## 2013-11-01 DIAGNOSIS — IMO0002 Reserved for concepts with insufficient information to code with codable children: Secondary | ICD-10-CM | POA: Diagnosis not present

## 2013-11-02 DIAGNOSIS — Z1231 Encounter for screening mammogram for malignant neoplasm of breast: Secondary | ICD-10-CM | POA: Diagnosis not present

## 2013-11-02 DIAGNOSIS — M949 Disorder of cartilage, unspecified: Secondary | ICD-10-CM | POA: Diagnosis not present

## 2013-11-02 DIAGNOSIS — M899 Disorder of bone, unspecified: Secondary | ICD-10-CM | POA: Diagnosis not present

## 2013-11-08 DIAGNOSIS — IMO0002 Reserved for concepts with insufficient information to code with codable children: Secondary | ICD-10-CM | POA: Diagnosis not present

## 2013-11-19 ENCOUNTER — Other Ambulatory Visit: Payer: Self-pay | Admitting: Internal Medicine

## 2013-12-18 ENCOUNTER — Encounter: Payer: Self-pay | Admitting: Internal Medicine

## 2013-12-18 ENCOUNTER — Ambulatory Visit (INDEPENDENT_AMBULATORY_CARE_PROVIDER_SITE_OTHER): Payer: Medicare Other | Admitting: Internal Medicine

## 2013-12-18 VITALS — BP 134/80 | HR 87 | Temp 98.3°F | Ht 62.0 in | Wt 184.0 lb

## 2013-12-18 DIAGNOSIS — Z96659 Presence of unspecified artificial knee joint: Secondary | ICD-10-CM | POA: Diagnosis not present

## 2013-12-18 DIAGNOSIS — Z96652 Presence of left artificial knee joint: Secondary | ICD-10-CM

## 2013-12-18 DIAGNOSIS — E785 Hyperlipidemia, unspecified: Secondary | ICD-10-CM

## 2013-12-18 DIAGNOSIS — Z23 Encounter for immunization: Secondary | ICD-10-CM

## 2013-12-18 DIAGNOSIS — M810 Age-related osteoporosis without current pathological fracture: Secondary | ICD-10-CM | POA: Diagnosis not present

## 2013-12-18 DIAGNOSIS — M545 Low back pain, unspecified: Secondary | ICD-10-CM | POA: Diagnosis not present

## 2013-12-18 DIAGNOSIS — Z0000 Encounter for general adult medical examination without abnormal findings: Secondary | ICD-10-CM | POA: Diagnosis not present

## 2013-12-18 DIAGNOSIS — J069 Acute upper respiratory infection, unspecified: Secondary | ICD-10-CM | POA: Diagnosis not present

## 2013-12-18 MED ORDER — LEVOTHYROXINE SODIUM 25 MCG PO TABS: ORAL_TABLET | ORAL | Status: DC

## 2013-12-18 MED ORDER — DIAZEPAM 5 MG PO TABS: 2.5000 mg | ORAL_TABLET | Freq: Two times a day (BID) | ORAL | Status: DC | PRN

## 2013-12-18 MED ORDER — ALENDRONATE SODIUM 70 MG PO TABS: 70.0000 mg | ORAL_TABLET | ORAL | Status: DC

## 2013-12-18 MED ORDER — CELECOXIB 200 MG PO CAPS: 200.0000 mg | ORAL_CAPSULE | Freq: Every day | ORAL | Status: DC

## 2013-12-18 NOTE — Assessment & Plan Note (Signed)
Doing well 

## 2013-12-18 NOTE — Patient Instructions (Signed)
Preventive Care for Adults A healthy lifestyle and preventive care can promote health and wellness. Preventive health guidelines for women include the following key practices.  A routine yearly physical is a good way to check with your health care provider about your health and preventive screening. It is a chance to share any concerns and updates on your health and to receive a thorough exam.  Visit your dentist for a routine exam and preventive care every 6 months. Brush your teeth twice a day and floss once a day. Good oral hygiene prevents tooth decay and gum disease.  The frequency of eye exams is based on your age, health, family medical history, use of contact lenses, and other factors. Follow your health care provider's recommendations for frequency of eye exams.  Eat a healthy diet. Foods like vegetables, fruits, whole grains, low-fat dairy products, and lean protein foods contain the nutrients you need without too many calories. Decrease your intake of foods high in solid fats, added sugars, and salt. Eat the right amount of calories for you.Get information about a proper diet from your health care provider, if necessary.  Regular physical exercise is one of the most important things you can do for your health. Most adults should get at least 150 minutes of moderate-intensity exercise (any activity that increases your heart rate and causes you to sweat) each week. In addition, most adults need muscle-strengthening exercises on 2 or more days a week.  Maintain a healthy weight. The body mass index (BMI) is a screening tool to identify possible weight problems. It provides an estimate of body fat based on height and weight. Your health care provider can find your BMI, and can help you achieve or maintain a healthy weight.For adults 20 years and older:  A BMI below 18.5 is considered underweight.  A BMI of 18.5 to 24.9 is normal.  A BMI of 25 to 29.9 is considered overweight.  A BMI of  30 and above is considered obese.  Maintain normal blood lipids and cholesterol levels by exercising and minimizing your intake of saturated fat. Eat a balanced diet with plenty of fruit and vegetables. Blood tests for lipids and cholesterol should begin at age 52 and be repeated every 5 years. If your lipid or cholesterol levels are high, you are over 50, or you are at high risk for heart disease, you may need your cholesterol levels checked more frequently.Ongoing high lipid and cholesterol levels should be treated with medicines if diet and exercise are not working.  If you smoke, find out from your health care provider how to quit. If you do not use tobacco, do not start.  Lung cancer screening is recommended for adults aged 37-80 years who are at high risk for developing lung cancer because of a history of smoking. A yearly low-dose CT scan of the lungs is recommended for people who have at least a 30-pack-year history of smoking and are a current smoker or have quit within the past 15 years. A pack year of smoking is smoking an average of 1 pack of cigarettes a day for 1 year (for example: 1 pack a day for 30 years or 2 packs a day for 15 years). Yearly screening should continue until the smoker has stopped smoking for at least 15 years. Yearly screening should be stopped for people who develop a health problem that would prevent them from having lung cancer treatment.  If you are pregnant, do not drink alcohol. If you are breastfeeding,  be very cautious about drinking alcohol. If you are not pregnant and choose to drink alcohol, do not have more than 1 drink per day. One drink is considered to be 12 ounces (355 mL) of beer, 5 ounces (148 mL) of wine, or 1.5 ounces (44 mL) of liquor.  Avoid use of street drugs. Do not share needles with anyone. Ask for help if you need support or instructions about stopping the use of drugs.  High blood pressure causes heart disease and increases the risk of  stroke. Your blood pressure should be checked at least every 1 to 2 years. Ongoing high blood pressure should be treated with medicines if weight loss and exercise do not work.  If you are 75-52 years old, ask your health care provider if you should take aspirin to prevent strokes.  Diabetes screening involves taking a blood sample to check your fasting blood sugar level. This should be done once every 3 years, after age 15, if you are within normal weight and without risk factors for diabetes. Testing should be considered at a younger age or be carried out more frequently if you are overweight and have at least 1 risk factor for diabetes.  Breast cancer screening is essential preventive care for women. You should practice "breast self-awareness." This means understanding the normal appearance and feel of your breasts and may include breast self-examination. Any changes detected, no matter how small, should be reported to a health care provider. Women in their 58s and 30s should have a clinical breast exam (CBE) by a health care provider as part of a regular health exam every 1 to 3 years. After age 16, women should have a CBE every year. Starting at age 53, women should consider having a mammogram (breast X-ray test) every year. Women who have a family history of breast cancer should talk to their health care provider about genetic screening. Women at a high risk of breast cancer should talk to their health care providers about having an MRI and a mammogram every year.  Breast cancer gene (BRCA)-related cancer risk assessment is recommended for women who have family members with BRCA-related cancers. BRCA-related cancers include breast, ovarian, tubal, and peritoneal cancers. Having family members with these cancers may be associated with an increased risk for harmful changes (mutations) in the breast cancer genes BRCA1 and BRCA2. Results of the assessment will determine the need for genetic counseling and  BRCA1 and BRCA2 testing.  Routine pelvic exams to screen for cancer are no longer recommended for nonpregnant women who are considered low risk for cancer of the pelvic organs (ovaries, uterus, and vagina) and who do not have symptoms. Ask your health care provider if a screening pelvic exam is right for you.  If you have had past treatment for cervical cancer or a condition that could lead to cancer, you need Pap tests and screening for cancer for at least 20 years after your treatment. If Pap tests have been discontinued, your risk factors (such as having a new sexual partner) need to be reassessed to determine if screening should be resumed. Some women have medical problems that increase the chance of getting cervical cancer. In these cases, your health care provider may recommend more frequent screening and Pap tests.  The HPV test is an additional test that may be used for cervical cancer screening. The HPV test looks for the virus that can cause the cell changes on the cervix. The cells collected during the Pap test can be  tested for HPV. The HPV test could be used to screen women aged 47 years and older, and should be used in women of any age who have unclear Pap test results. After the age of 36, women should have HPV testing at the same frequency as a Pap test.  Colorectal cancer can be detected and often prevented. Most routine colorectal cancer screening begins at the age of 38 years and continues through age 58 years. However, your health care provider may recommend screening at an earlier age if you have risk factors for colon cancer. On a yearly basis, your health care provider may provide home test kits to check for hidden blood in the stool. Use of a small camera at the end of a tube, to directly examine the colon (sigmoidoscopy or colonoscopy), can detect the earliest forms of colorectal cancer. Talk to your health care provider about this at age 64, when routine screening begins. Direct  exam of the colon should be repeated every 5-10 years through age 21 years, unless early forms of pre-cancerous polyps or small growths are found.  People who are at an increased risk for hepatitis B should be screened for this virus. You are considered at high risk for hepatitis B if:  You were born in a country where hepatitis B occurs often. Talk with your health care provider about which countries are considered high risk.  Your parents were born in a high-risk country and you have not received a shot to protect against hepatitis B (hepatitis B vaccine).  You have HIV or AIDS.  You use needles to inject street drugs.  You live with, or have sex with, someone who has Hepatitis B.  You get hemodialysis treatment.  You take certain medicines for conditions like cancer, organ transplantation, and autoimmune conditions.  Hepatitis C blood testing is recommended for all people born from [birth year] through 1965 and any individual with known risks for hepatitis C.  Practice safe sex. Use condoms and avoid high-risk sexual practices to reduce the spread of sexually transmitted infections (STIs). STIs include gonorrhea, chlamydia, syphilis, trichomonas, herpes, HPV, and human immunodeficiency virus (HIV). Herpes, HIV, and HPV are viral illnesses that have no cure. They can result in disability, cancer, and death.  You should be screened for sexually transmitted illnesses (STIs) including gonorrhea and chlamydia if:  You are sexually active and are younger than 24 years.  You are older than 24 years and your health care provider tells you that you are at risk for this type of infection.  Your sexual activity has changed since you were last screened and you are at an increased risk for chlamydia or gonorrhea. Ask your health care provider if you are at risk.  If you are at risk of being infected with HIV, it is recommended that you take a prescription medicine daily to prevent HIV infection. This is  called preexposure prophylaxis (PrEP). You are considered at risk if:  You are a heterosexual woman, are sexually active, and are at increased risk for HIV infection.  You take drugs by injection.  You are sexually active with a partner who has HIV.  Talk with your health care provider about whether you are at high risk of being infected with HIV. If you choose to begin PrEP, you should first be tested for HIV. You should then be tested every 3 months for as long as you are taking PrEP.  Osteoporosis is a disease in which the bones lose minerals and strength  with aging. This can result in serious bone fractures or breaks. The risk of osteoporosis can be identified using a bone density scan. Women ages 65 years and over and women at risk for fractures or osteoporosis should discuss screening with their health care providers. Ask your health care provider whether you should take a calcium supplement or vitamin D to reduce the rate of osteoporosis.  Menopause can be associated with physical symptoms and risks. Hormone replacement therapy is available to decrease symptoms and risks. You should talk to your health care provider about whether hormone replacement therapy is right for you.  Use sunscreen. Apply sunscreen liberally and repeatedly throughout the day. You should seek shade when your shadow is shorter than you. Protect yourself by wearing long sleeves, pants, a wide-brimmed hat, and sunglasses year round, whenever you are outdoors.  Once a month, do a whole body skin exam, using a mirror to look at the skin on your back. Tell your health care provider of new moles, moles that have irregular borders, moles that are larger than a pencil eraser, or moles that have changed in shape or color.  Stay current with required vaccines (immunizations).  Influenza vaccine. All adults should be immunized every year.  Tetanus, diphtheria, and acellular pertussis (Td, Tdap) vaccine. Pregnant women should  receive 1 dose of Tdap vaccine during each pregnancy. The dose should be obtained regardless of the length of time since the last dose. Immunization is preferred during the 27th-36th week of gestation. An adult who has not previously received Tdap or who does not know her vaccine status should receive 1 dose of Tdap. This initial dose should be followed by tetanus and diphtheria toxoids (Td) booster doses every 10 years. Adults with an unknown or incomplete history of completing a 3-dose immunization series with Td-containing vaccines should begin or complete a primary immunization series including a Tdap dose. Adults should receive a Td booster every 10 years.  Varicella vaccine. An adult without evidence of immunity to varicella should receive 2 doses or a second dose if she has previously received 1 dose. Pregnant females who do not have evidence of immunity should receive the first dose after pregnancy. This first dose should be obtained before leaving the health care facility. The second dose should be obtained 4-8 weeks after the first dose.  Human papillomavirus (HPV) vaccine. Females aged 13-26 years who have not received the vaccine previously should obtain the 3-dose series. The vaccine is not recommended for use in pregnant females. However, pregnancy testing is not needed before receiving a dose. If a female is found to be pregnant after receiving a dose, no treatment is needed. In that case, the remaining doses should be delayed until after the pregnancy. Immunization is recommended for any person with an immunocompromised condition through the age of 26 years if she did not get any or all doses earlier. During the 3-dose series, the second dose should be obtained 4-8 weeks after the first dose. The third dose should be obtained 24 weeks after the first dose and 16 weeks after the second dose.  Zoster vaccine. One dose is recommended for adults aged 60 years or older unless certain conditions are  present.  Measles, mumps, and rubella (MMR) vaccine. Adults born before 1957 generally are considered immune to measles and mumps. Adults born in 1957 or later should have 1 or more doses of MMR vaccine unless there is a contraindication to the vaccine or there is laboratory evidence of immunity to   each of the three diseases. A routine second dose of MMR vaccine should be obtained at least 28 days after the first dose for students attending postsecondary schools, health care workers, or international travelers. People who received inactivated measles vaccine or an unknown type of measles vaccine during 1963-1967 should receive 2 doses of MMR vaccine. People who received inactivated mumps vaccine or an unknown type of mumps vaccine before 1979 and are at high risk for mumps infection should consider immunization with 2 doses of MMR vaccine. For females of childbearing age, rubella immunity should be determined. If there is no evidence of immunity, females who are not pregnant should be vaccinated. If there is no evidence of immunity, females who are pregnant should delay immunization until after pregnancy. Unvaccinated health care workers born before 1957 who lack laboratory evidence of measles, mumps, or rubella immunity or laboratory confirmation of disease should consider measles and mumps immunization with 2 doses of MMR vaccine or rubella immunization with 1 dose of MMR vaccine.  Pneumococcal 13-valent conjugate (PCV13) vaccine. When indicated, a person who is uncertain of her immunization history and has no record of immunization should receive the PCV13 vaccine. An adult aged 19 years or older who has certain medical conditions and has not been previously immunized should receive 1 dose of PCV13 vaccine. This PCV13 should be followed with a dose of pneumococcal polysaccharide (PPSV23) vaccine. The PPSV23 vaccine dose should be obtained at least 8 weeks after the dose of PCV13 vaccine. An adult aged 19  years or older who has certain medical conditions and previously received 1 or more doses of PPSV23 vaccine should receive 1 dose of PCV13. The PCV13 vaccine dose should be obtained 1 or more years after the last PPSV23 vaccine dose.  Pneumococcal polysaccharide (PPSV23) vaccine. When PCV13 is also indicated, PCV13 should be obtained first. All adults aged 65 years and older should be immunized. An adult younger than age 65 years who has certain medical conditions should be immunized. Any person who resides in a nursing home or long-term care facility should be immunized. An adult smoker should be immunized. People with an immunocompromised condition and certain other conditions should receive both PCV13 and PPSV23 vaccines. People with human immunodeficiency virus (HIV) infection should be immunized as soon as possible after diagnosis. Immunization during chemotherapy or radiation therapy should be avoided. Routine use of PPSV23 vaccine is not recommended for American Indians, [place] Natives, or people younger than 65 years unless there are medical conditions that require PPSV23 vaccine. When indicated, people who have unknown immunization and have no record of immunization should receive PPSV23 vaccine. One-time revaccination 5 years after the first dose of PPSV23 is recommended for people aged 19-64 years who have chronic kidney failure, nephrotic syndrome, asplenia, or immunocompromised conditions. People who received 1-2 doses of PPSV23 before age 65 years should receive another dose of PPSV23 vaccine at age 65 years or later if at least 5 years have passed since the previous dose. Doses of PPSV23 are not needed for people immunized with PPSV23 at or after age 65 years.  Meningococcal vaccine. Adults with asplenia or persistent complement component deficiencies should receive 2 doses of quadrivalent meningococcal conjugate (MenACWY-D) vaccine. The doses should be obtained at least 2 months apart.  Microbiologists working with certain meningococcal bacteria, military recruits, people at risk during an outbreak, and people who travel to or live in countries with a high rate of meningitis should be immunized. A first-year college student up through age   21 years who is living in a residence hall should receive a dose if she did not receive a dose on or after her 16th birthday. Adults who have certain high-risk conditions should receive one or more doses of vaccine.  Hepatitis A vaccine. Adults who wish to be protected from this disease, have certain high-risk conditions, work with hepatitis A-infected animals, work in hepatitis A research labs, or travel to or work in countries with a high rate of hepatitis A should be immunized. Adults who were previously unvaccinated and who anticipate close contact with an international adoptee during the first 60 days after arrival in the [place] from a country with a high rate of hepatitis A should be immunized.  Hepatitis B vaccine. Adults who wish to be protected from this disease, have certain high-risk conditions, may be exposed to blood or other infectious body fluids, are household contacts or sex partners of hepatitis B positive people, are clients or workers in certain care facilities, or travel to or work in countries with a high rate of hepatitis B should be immunized.  Haemophilus influenzae type b (Hib) vaccine. A previously unvaccinated person with asplenia or sickle cell disease or having a scheduled splenectomy should receive 1 dose of Hib vaccine. Regardless of previous immunization, a recipient of a hematopoietic stem cell transplant should receive a 3-dose series 6-12 months after her successful transplant. Hib vaccine is not recommended for adults with HIV infection. Preventive Services / Frequency Ages 43 to 39years  Blood pressure check.** / Every 1 to 2 years.  Lipid and cholesterol check.** / Every 5 years beginning at age  75.  Clinical breast exam.** / Every 3 years for women in their 32s and 74s.  BRCA-related cancer risk assessment.** / For women who have family members with a BRCA-related cancer (breast, ovarian, tubal, or peritoneal cancers).  Pap test.** / Every 2 years from ages 65 through 91. Every 3 years starting at age 34 through age 93 or 72 with a history of 3 consecutive normal Pap tests.  HPV screening.** / Every 3 years from ages 46 through ages 53 to 26 with a history of 3 consecutive normal Pap tests.  Hepatitis C blood test.** / For any individual with known risks for hepatitis C.  Skin self-exam. / Monthly.  Influenza vaccine. / Every year.  Tetanus, diphtheria, and acellular pertussis (Tdap, Td) vaccine.** / Consult your health care provider. Pregnant women should receive 1 dose of Tdap vaccine during each pregnancy. 1 dose of Td every 10 years.  Varicella vaccine.** / Consult your health care provider. Pregnant females who do not have evidence of immunity should receive the first dose after pregnancy.  HPV vaccine. / 3 doses over 6 months, if 70 and younger. The vaccine is not recommended for use in pregnant females. However, pregnancy testing is not needed before receiving a dose.  Measles, mumps, rubella (MMR) vaccine.** / You need at least 1 dose of MMR if you were born in 1957 or later. You may also need a 2nd dose. For females of childbearing age, rubella immunity should be determined. If there is no evidence of immunity, females who are not pregnant should be vaccinated. If there is no evidence of immunity, females who are pregnant should delay immunization until after pregnancy.  Pneumococcal 13-valent conjugate (PCV13) vaccine.** / Consult your health care provider.  Pneumococcal polysaccharide (PPSV23) vaccine.** / 1 to 2 doses if you smoke cigarettes or if you have certain conditions.  Meningococcal vaccine.** /  1 dose if you are age 70 to 51 years and a [place] living in a residence hall, or have one of several medical conditions, you need to get vaccinated against meningococcal disease. You may also need additional booster doses.  Hepatitis A vaccine.** / Consult your health care provider.  Hepatitis B vaccine.** / Consult your health care provider.  Haemophilus influenzae type b (Hib) vaccine.** / Consult your health care provider. Ages 40 to 64years  Blood pressure check.** / Every 1 to 2 years.  Lipid and cholesterol check.** / Every 5 years beginning at age 58 years.  Lung cancer screening. / Every year if you are aged 56-80 years and have a 30-pack-year history of smoking and currently smoke or have quit within the past 15 years. Yearly screening is stopped once you have quit smoking for at least 15 years or develop a health problem that would prevent you from having lung cancer treatment.  Clinical breast exam.** / Every year after age 35 years.  BRCA-related cancer risk assessment.** / For women who have family members with a BRCA-related cancer (breast, ovarian, tubal, or peritoneal cancers).  Mammogram.** / Every year beginning at age 109 years and continuing for as long as you are in good health. Consult with your health care provider.  Pap test.** / Every 3 years starting at age 44 years through age 94 or 70 years with a history of 3 consecutive normal Pap tests.  HPV screening.** / Every 3 years from ages 109 years through ages 50 to 30 years with a history of 3 consecutive normal Pap tests.  Fecal occult blood test (FOBT) of stool. / Every year beginning at age 73 years and continuing until age 59 years. You may not need to do this test if you get a colonoscopy every 10 years.  Flexible sigmoidoscopy or colonoscopy.** / Every 5 years for a flexible sigmoidoscopy or every 10 years for a colonoscopy beginning at age 68 years and continuing until age 12 years.  Hepatitis C blood test.** / For all people born from [birth year] through  1965 and any individual with known risks for hepatitis C.  Skin self-exam. / Monthly.  Influenza vaccine. / Every year.  Tetanus, diphtheria, and acellular pertussis (Tdap/Td) vaccine.** / Consult your health care provider. Pregnant women should receive 1 dose of Tdap vaccine during each pregnancy. 1 dose of Td every 10 years.  Varicella vaccine.** / Consult your health care provider. Pregnant females who do not have evidence of immunity should receive the first dose after pregnancy.  Zoster vaccine.** / 1 dose for adults aged 2 years or older.  Measles, mumps, rubella (MMR) vaccine.** / You need at least 1 dose of MMR if you were born in 1957 or later. You may also need a 2nd dose. For females of childbearing age, rubella immunity should be determined. If there is no evidence of immunity, females who are not pregnant should be vaccinated. If there is no evidence of immunity, females who are pregnant should delay immunization until after pregnancy.  Pneumococcal 13-valent conjugate (PCV13) vaccine.** / Consult your health care provider.  Pneumococcal polysaccharide (PPSV23) vaccine.** / 1 to 2 doses if you smoke cigarettes or if you have certain conditions.  Meningococcal vaccine.** / Consult your health care provider.  Hepatitis A vaccine.** / Consult your health care provider.  Hepatitis B vaccine.** / Consult your health care provider.  Haemophilus influenzae type b (Hib) vaccine.** / Consult your health care provider. Ages 48 years  and over  Blood pressure check.** / Every 1 to 2 years.  Lipid and cholesterol check.** / Every 5 years beginning at age 84 years.  Lung cancer screening. / Every year if you are aged 50-80 years and have a 30-pack-year history of smoking and currently smoke or have quit within the past 15 years. Yearly screening is stopped once you have quit smoking for at least 15 years or develop a health problem that would prevent you from having lung cancer  treatment.  Clinical breast exam.** / Every year after age 24 years.  BRCA-related cancer risk assessment.** / For women who have family members with a BRCA-related cancer (breast, ovarian, tubal, or peritoneal cancers).  Mammogram.** / Every year beginning at age 14 years and continuing for as long as you are in good health. Consult with your health care provider.  Pap test.** / Every 3 years starting at age 17 years through age 31 or 74 years with 3 consecutive normal Pap tests. Testing can be stopped between 65 and 70 years with 3 consecutive normal Pap tests and no abnormal Pap or HPV tests in the past 10 years.  HPV screening.** / Every 3 years from ages 30 years through ages 70 or 28 years with a history of 3 consecutive normal Pap tests. Testing can be stopped between 65 and 70 years with 3 consecutive normal Pap tests and no abnormal Pap or HPV tests in the past 10 years.  Fecal occult blood test (FOBT) of stool. / Every year beginning at age 64 years and continuing until age 92 years. You may not need to do this test if you get a colonoscopy every 10 years.  Flexible sigmoidoscopy or colonoscopy.** / Every 5 years for a flexible sigmoidoscopy or every 10 years for a colonoscopy beginning at age 73 years and continuing until age 39 years.  Hepatitis C blood test.** / For all people born from [birth year] through 1967 and any individual with known risks for hepatitis C.  Osteoporosis screening.** / A one-time screening for women ages 35 years and over and women at risk for fractures or osteoporosis.  Skin self-exam. / Monthly.  Influenza vaccine. / Every year.  Tetanus, diphtheria, and acellular pertussis (Tdap/Td) vaccine.** / 1 dose of Td every 10 years.  Varicella vaccine.** / Consult your health care provider.  Zoster vaccine.** / 1 dose for adults aged 59 years or older.  Pneumococcal 13-valent conjugate (PCV13) vaccine.** / Consult your health care provider.  Pneumococcal  polysaccharide (PPSV23) vaccine.** / 1 dose for all adults aged 8 years and older.  Meningococcal vaccine.** / Consult your health care provider.  Hepatitis A vaccine.** / Consult your health care provider.  Hepatitis B vaccine.** / Consult your health care provider.  Haemophilus influenzae type b (Hib) vaccine.** / Consult your health care provider. ** Family history and personal history of risk and conditions may change your health care provider's recommendations. Document Released: 07/14/2001 Document Revised: 05/23/2013 Document Reviewed: 10/13/2010 [facility] Patient Information 2015 [place], [place]. This information is not intended to replace advice given to you by your health care provider. Make sure you discuss any questions you have with your health care provider.

## 2013-12-18 NOTE — Progress Notes (Signed)
   Subjective:    HPI  The patient is here for a wellness exam. The patient has been doing well overall without major physical or psychological issues going on lately - she is s/p back surgery for LBP/RLE pain - better. The patient needs to address  chronic hypothyroidism that has been well controlled with medicine   BP Readings from Last 3 Encounters:  12/18/13 134/80  08/02/13 112/64  08/02/13 112/64   Wt Readings from Last 3 Encounters:  12/18/13 184 lb (83.462 kg)  08/01/13 183 lb (83.008 kg)  08/01/13 183 lb (83.008 kg)      Review of Systems  Constitutional: Negative for fever, chills, diaphoresis, activity change, appetite change, fatigue and unexpected weight change.  HENT: Negative for congestion, dental problem, ear pain, hearing loss, mouth sores, postnasal drip, sinus pressure, sneezing, sore throat and voice change.   Eyes: Negative for pain and visual disturbance.  Respiratory: Negative for cough, chest tightness, wheezing and stridor.   Cardiovascular: Negative for chest pain, palpitations and leg swelling.  Gastrointestinal: Negative for nausea, vomiting, abdominal pain, blood in stool, abdominal distention and rectal pain.  Genitourinary: Negative for dysuria, hematuria, decreased urine volume, vaginal bleeding, vaginal discharge, difficulty urinating, vaginal pain and menstrual problem.  Musculoskeletal: Positive for arthralgias and back pain. Negative for gait problem, joint swelling and neck pain.  Skin: Negative for color change, rash and wound.  Neurological: Positive for numbness. Negative for dizziness, tremors, syncope, speech difficulty and light-headedness.  Hematological: Negative for adenopathy.  Psychiatric/Behavioral: Negative for suicidal ideas, hallucinations, behavioral problems, confusion, sleep disturbance, dysphoric mood and decreased concentration. The patient is not hyperactive.        Objective:   Physical Exam  Constitutional: She  appears well-developed. No distress.  Obese   HENT:  Head: Normocephalic.  Right Ear: External ear normal.  Left Ear: External ear normal.  Nose: Nose normal.  Mouth/Throat: Oropharynx is clear and moist.  Eyes: Conjunctivae are normal. Pupils are equal, round, and reactive to light. Right eye exhibits no discharge. Left eye exhibits no discharge.  Neck: Normal range of motion. Neck supple. No JVD present. No tracheal deviation present. No thyromegaly present.  Cardiovascular: Normal rate, regular rhythm and normal heart sounds.   Pulmonary/Chest: No stridor. No respiratory distress. She has no wheezes.  Abdominal: Soft. Bowel sounds are normal. She exhibits no distension and no mass. There is no tenderness. There is no rebound and no guarding.  Musculoskeletal: She exhibits tenderness (B knees). She exhibits no edema.  Lymphadenopathy:    She has no cervical adenopathy.  Neurological: She displays abnormal reflex. No cranial nerve deficit. She exhibits normal muscle tone. Coordination normal.  Skin: No rash noted. No erythema.  Psychiatric: She has a normal mood and affect. Her behavior is normal. Judgment and thought content normal.     Lab Results  Component Value Date   WBC 12.0* 08/02/2013   HGB 11.9* 08/02/2013   HCT 36.2 08/02/2013   PLT 274 08/02/2013   GLUCOSE 123* 08/02/2013   CHOL 186 11/07/2012   TRIG 125.0 11/07/2012   HDL 57.30 11/07/2012   LDLCALC 104* 11/07/2012   ALT 28 11/07/2012   AST 29 11/07/2012   NA 139 08/02/2013   K 4.6 08/02/2013   CL 103 08/02/2013   CREATININE 0.83 08/02/2013   BUN 21 08/02/2013   CO2 24 08/02/2013   TSH 1.70 11/07/2012   INR 1.01 07/24/2013        Assessment & Plan:

## 2013-12-18 NOTE — Assessment & Plan Note (Addendum)
Here for medicare wellness/physical  Diet: heart healthy  Physical activity: not sedentary -  walking Depression/mood screen: negative  Hearing: intact to whispered voice  Visual acuity: grossly normal, performs annual eye exam  ADLs: capable  Fall risk: none  Home safety: good  Cognitive evaluation: intact to orientation, naming, recall and repetition  EOL planning: adv directives, full code/ I agree  I have personally reviewed and have noted  1. The patient's medical and social history  2. Their use of alcohol, tobacco or illicit drugs  3. Their current medications and supplements  4. The patient's functional ability including ADL's, fall risks, home safety risks and hearing or visual impairment.  5. Diet and physical activities  6. Evidence for depression or mood disorders    Today patient counseled on age appropriate routine health concerns for screening and prevention, each reviewed and up to date or declined. Immunizations reviewed and up to date or declined. Labs ordered and reviewed. Risk factors for depression reviewed and negative. Hearing function and visual acuity are intact. ADLs screened and addressed as needed. Functional ability and level of safety reviewed and appropriate. Education, counseling and referrals performed based on assessed risks today. Patient provided with a copy of personalized plan for preventive services.

## 2013-12-18 NOTE — Assessment & Plan Note (Addendum)
7/15 worse S/p multiple recent ortho surgeries Start Fosamax   Potential benefits of Fosamax  use as well as potential risks  and complications were explained to the patient and were aknowledged. Prolia info given

## 2013-12-18 NOTE — Assessment & Plan Note (Signed)
Chronic  Better post back surgery - fusion L4-5 Dr [place]

## 2013-12-18 NOTE — Progress Notes (Signed)
Pre visit review using our clinic review tool, if applicable. No additional management support is needed unless otherwise documented below in the visit note. 

## 2013-12-18 NOTE — Assessment & Plan Note (Signed)
?  viral OTC meds

## 2013-12-25 DIAGNOSIS — Z8551 Personal history of malignant neoplasm of bladder: Secondary | ICD-10-CM | POA: Diagnosis not present

## 2013-12-26 ENCOUNTER — Other Ambulatory Visit (INDEPENDENT_AMBULATORY_CARE_PROVIDER_SITE_OTHER): Payer: Medicare Other

## 2013-12-26 DIAGNOSIS — E785 Hyperlipidemia, unspecified: Secondary | ICD-10-CM | POA: Diagnosis not present

## 2013-12-26 DIAGNOSIS — M545 Low back pain, unspecified: Secondary | ICD-10-CM

## 2013-12-26 DIAGNOSIS — Z0000 Encounter for general adult medical examination without abnormal findings: Secondary | ICD-10-CM | POA: Diagnosis not present

## 2013-12-26 LAB — HEPATIC FUNCTION PANEL
ALBUMIN: 3.7 g/dL (ref 3.5–5.2)
ALT: 20 U/L (ref 0–35)
AST: 22 U/L (ref 0–37)
Alkaline Phosphatase: 74 U/L (ref 39–117)
Bilirubin, Direct: 0.1 mg/dL (ref 0.0–0.3)
TOTAL PROTEIN: 6.8 g/dL (ref 6.0–8.3)
Total Bilirubin: 0.7 mg/dL (ref 0.2–1.2)

## 2013-12-26 LAB — CBC WITH DIFFERENTIAL/PLATELET
Basophils Absolute: 0 10*3/uL (ref 0.0–0.1)
Basophils Relative: 0.3 % (ref 0.0–3.0)
EOS PCT: 4.4 % (ref 0.0–5.0)
Eosinophils Absolute: 0.3 10*3/uL (ref 0.0–0.7)
HEMATOCRIT: 39.6 % (ref 36.0–46.0)
Hemoglobin: 13.4 g/dL (ref 12.0–15.0)
LYMPHS ABS: 1.5 10*3/uL (ref 0.7–4.0)
Lymphocytes Relative: 20.1 % (ref 12.0–46.0)
MCHC: 33.7 g/dL (ref 30.0–36.0)
MCV: 94.1 fl (ref 78.0–100.0)
MONOS PCT: 9.5 % (ref 3.0–12.0)
Monocytes Absolute: 0.7 10*3/uL (ref 0.1–1.0)
NEUTROS PCT: 65.7 % (ref 43.0–77.0)
Neutro Abs: 4.8 10*3/uL (ref 1.4–7.7)
PLATELETS: 342 10*3/uL (ref 150.0–400.0)
RBC: 4.21 Mil/uL (ref 3.87–5.11)
RDW: 13.3 % (ref 11.5–15.5)
WBC: 7.4 10*3/uL (ref 4.0–10.5)

## 2013-12-26 LAB — LIPID PANEL
Cholesterol: 154 mg/dL (ref 0–200)
HDL: 50.7 mg/dL (ref >39.00)
LDL Cholesterol: 79 mg/dL (ref 0–99)
NONHDL: 103.3
Total CHOL/HDL Ratio: 3
Triglycerides: 120 mg/dL (ref 0.0–149.0)
VLDL: 24 mg/dL (ref 0.0–40.0)

## 2013-12-26 LAB — BASIC METABOLIC PANEL
BUN: 22 mg/dL (ref 6–23)
CO2: 30 meq/L (ref 19–32)
Calcium: 9.3 mg/dL (ref 8.4–10.5)
Chloride: 106 mEq/L (ref 96–112)
Creatinine, Ser: 1 mg/dL (ref 0.4–1.2)
GFR: 58.79 mL/min — ABNORMAL LOW (ref >60.00)
GLUCOSE: 105 mg/dL — AB (ref 70–99)
POTASSIUM: 4.1 meq/L (ref 3.5–5.1)
Sodium: 140 mEq/L (ref 135–145)

## 2013-12-26 LAB — TSH: TSH: 1.62 u[IU]/mL (ref 0.35–4.50)

## 2013-12-27 DIAGNOSIS — M2010 Hallux valgus (acquired), unspecified foot: Secondary | ICD-10-CM | POA: Diagnosis not present

## 2013-12-27 LAB — URINALYSIS
Bilirubin Urine: NEGATIVE
HGB URINE DIPSTICK: NEGATIVE
KETONES UR: NEGATIVE
Leukocytes, UA: NEGATIVE
Nitrite: NEGATIVE
Specific Gravity, Urine: 1.01 (ref 1.000–1.030)
Total Protein, Urine: NEGATIVE
URINE GLUCOSE: NEGATIVE
Urobilinogen, UA: 0.2 (ref 0.0–1.0)
pH: 6.5 (ref 5.0–8.0)

## 2014-01-15 ENCOUNTER — Other Ambulatory Visit: Payer: Self-pay | Admitting: Internal Medicine

## 2014-01-19 NOTE — Telephone Encounter (Signed)
Called pharmacy spoke with Ray gave md approval.../lmb

## 2014-02-16 ENCOUNTER — Encounter (HOSPITAL_BASED_OUTPATIENT_CLINIC_OR_DEPARTMENT_OTHER): Payer: Self-pay | Admitting: *Deleted

## 2014-02-16 NOTE — Progress Notes (Signed)
Pt in [place]-has a cold-taking meds-will see pcp when she gets back-if chest congestion will call and r/s No labs needed

## 2014-02-20 ENCOUNTER — Telehealth: Payer: Self-pay | Admitting: *Deleted

## 2014-02-20 ENCOUNTER — Encounter: Payer: Self-pay | Admitting: Internal Medicine

## 2014-02-20 ENCOUNTER — Ambulatory Visit (INDEPENDENT_AMBULATORY_CARE_PROVIDER_SITE_OTHER): Payer: Medicare Other | Admitting: Internal Medicine

## 2014-02-20 VITALS — BP 112/82 | HR 77 | Temp 97.9°F | Resp 16 | Ht 62.0 in | Wt 180.0 lb

## 2014-02-20 DIAGNOSIS — Z23 Encounter for immunization: Secondary | ICD-10-CM | POA: Diagnosis not present

## 2014-02-20 DIAGNOSIS — J309 Allergic rhinitis, unspecified: Secondary | ICD-10-CM | POA: Diagnosis not present

## 2014-02-20 MED ORDER — BENZONATATE 100 MG PO CAPS: 100.0000 mg | ORAL_CAPSULE | Freq: Three times a day (TID) | ORAL | Status: DC | PRN

## 2014-02-20 MED ORDER — FLUTICASONE PROPIONATE 50 MCG/ACT NA SUSP: 2.0000 | Freq: Two times a day (BID) | NASAL | Status: DC

## 2014-02-20 NOTE — Progress Notes (Signed)
Pre visit review using our clinic review tool, if applicable. No additional management support is needed unless otherwise documented below in the visit note. 

## 2014-02-20 NOTE — Patient Instructions (Signed)
Your lungs sound clear and you should be fine to have your surgery on your foot this Thursday.  We have sent in flonase which is a nose spray. 2 puffs in each nostril twice a day for the next 2 weeks will help this to stay away and clear your sinuses.   The other medicine is called tessalon perles which should help with your cough.   Allergic Rhinitis Allergic rhinitis is when the mucous membranes in the nose respond to allergens. Allergens are particles in the air that cause your body to have an allergic reaction. This causes you to release allergic antibodies. Through a chain of events, these eventually cause you to release histamine into the blood stream. Although meant to protect the body, it is this release of histamine that causes your discomfort, such as frequent sneezing, congestion, and an itchy, runny nose.  CAUSES  Seasonal allergic rhinitis (hay fever) is caused by pollen allergens that may come from grasses, trees, and weeds. Year-round allergic rhinitis (perennial allergic rhinitis) is caused by allergens such as house dust mites, pet dander, and mold spores.  SYMPTOMS   Nasal stuffiness (congestion).  Itchy, runny nose with sneezing and tearing of the eyes. DIAGNOSIS  Your health care provider can help you determine the allergen or allergens that trigger your symptoms. If you and your health care provider are unable to determine the allergen, skin or blood testing may be used. TREATMENT  Allergic rhinitis does not have a cure, but it can be controlled by:  Medicines and allergy shots (immunotherapy).  Avoiding the allergen. Hay fever may often be treated with antihistamines in pill or nasal spray forms. Antihistamines block the effects of histamine. There are over-the-counter medicines that may help with nasal congestion and swelling around the eyes. Check with your health care provider before taking or giving this medicine.  If avoiding the allergen or the medicine prescribed  do not work, there are many new medicines your health care provider can prescribe. Stronger medicine may be used if initial measures are ineffective. Desensitizing injections can be used if medicine and avoidance does not work. Desensitization is when a patient is given ongoing shots until the body becomes less sensitive to the allergen. Make sure you follow up with your health care provider if problems continue. HOME CARE INSTRUCTIONS It is not possible to completely avoid allergens, but you can reduce your symptoms by taking steps to limit your exposure to them. It helps to know exactly what you are allergic to so that you can avoid your specific triggers. SEEK MEDICAL CARE IF:   You have a fever.  You develop a cough that does not stop easily (persistent).  You have shortness of breath.  You start wheezing.  Symptoms interfere with normal daily activities. Document Released: 02/10/2001 Document Revised: 05/23/2013 Document Reviewed: 01/23/2013 [facility] Patient Information 2015 [place], [place]. This information is not intended to replace advice given to you by your health care provider. Make sure you discuss any questions you have with your health care provider.

## 2014-02-20 NOTE — Telephone Encounter (Signed)
Received msg from [facility] @ [facility]. MD was suppose to have sent rx in for pt flonase & tessalon checking the status. Per chart md sent to [facility] will resend scripts to [facility]...Mariah Farrell

## 2014-02-20 NOTE — Progress Notes (Signed)
   Subjective:    Patient ID: Mariah Farrell, female    DOB: [birth year], 73 y.o.   MRN: [id number]  HPI The patient is a 73 YO female coming in for an acute visit for sinus symptoms.. She is due to have surgery on her foot later this week and wanted to get checked out. She is starting to feel better but symptoms started last week. She has not been having any ear pain or drainage. She is having some mild watering of her eyes but not the worst she's had. She denies fevers but felt hot after her shower this morning. She denies facial tenderness. She is having a lot of congestion in her nose and dripping down her throat. She is having mostly clear drainage at this time. Originally she was having some mild green stuff. She is still coughing mostly dry but occasionally some clear to mildly green sputum.   Review of Systems  Constitutional: Negative for fever, chills, activity change, appetite change and fatigue.  HENT: Positive for congestion, postnasal drip, rhinorrhea and sinus pressure. Negative for dental problem, drooling, ear discharge, ear pain, facial swelling, hearing loss, mouth sores, nosebleeds, sneezing, sore throat, tinnitus, trouble swallowing and voice change.   Eyes: Positive for discharge. Negative for pain and itching.       Mild watery  Respiratory: Positive for cough. Negative for chest tightness, shortness of breath and wheezing.   Cardiovascular: Negative for chest pain, palpitations and leg swelling.  Gastrointestinal: Negative.       Objective:   Physical Exam  Constitutional: She appears well-developed and well-nourished. No distress.  HENT:  Head: Normocephalic and atraumatic.  Right Ear: External ear normal.  Left Ear: External ear normal.  Some erythema in nose without crusting. Mild clear drainage at the back of the throat without swelling, purulent material.   Eyes: EOM are normal.  Neck: Normal range of motion. Neck supple. No thyromegaly present.    Cardiovascular: Normal rate and regular rhythm.   Pulmonary/Chest: Effort normal and breath sounds normal. No respiratory distress. She has no wheezes. She has no rales. She exhibits no tenderness.  Lymphadenopathy:    She has no cervical adenopathy.   Filed Vitals:   02/20/14 0936  BP: 112/82  Pulse: 77  Temp: 97.9 F (36.6 C)  TempSrc: Oral  Resp: 16  Height: 5\' 2"  (1.575 m)  Weight: 180 lb (81.647 kg)  SpO2: 98%      Assessment & Plan:

## 2014-02-20 NOTE — Assessment & Plan Note (Signed)
Patient is having allergy symptoms which appear to be resolving. Given upcoming surgery she wanted to be evaluated. No concern for need to cancel or delay surgery. Lungs clear. Given rx for flonase to help speed recovery and tessalon perles for cough. No antibiotic needed at this time and no concern for pneumonia. Recommended flonase 2 puffs per nostril BID for 2 weeks and to continue taking zyrtec. If she has fevers, SOB she will call back.

## 2014-02-22 ENCOUNTER — Ambulatory Visit (HOSPITAL_BASED_OUTPATIENT_CLINIC_OR_DEPARTMENT_OTHER): Admission: RE | Admit: 2014-02-22 | Payer: Medicare Other | Source: Ambulatory Visit | Admitting: Orthopedic Surgery

## 2014-02-22 HISTORY — DX: Other seasonal allergic rhinitis: J30.2

## 2014-02-22 SURGERY — FUSION, JOINT, FOOT
Anesthesia: General | Site: Foot | Laterality: Right

## 2014-03-27 DIAGNOSIS — H40053 Ocular hypertension, bilateral: Secondary | ICD-10-CM | POA: Diagnosis not present

## 2014-03-27 DIAGNOSIS — H40013 Open angle with borderline findings, low risk, bilateral: Secondary | ICD-10-CM | POA: Diagnosis not present

## 2014-06-08 DIAGNOSIS — L820 Inflamed seborrheic keratosis: Secondary | ICD-10-CM | POA: Diagnosis not present

## 2014-06-13 ENCOUNTER — Other Ambulatory Visit: Payer: Self-pay | Admitting: Internal Medicine

## 2014-06-13 ENCOUNTER — Ambulatory Visit: Payer: Medicare Other | Admitting: Internal Medicine

## 2014-06-13 MED ORDER — CELECOXIB 200 MG PO CAPS: 200.0000 mg | ORAL_CAPSULE | Freq: Every day | ORAL | Status: DC

## 2014-06-13 NOTE — Telephone Encounter (Signed)
Refill for celebrex has been sent to [facility]. Pls advise on valium refill...Johny Chess

## 2014-06-13 NOTE — Telephone Encounter (Signed)
Pt called in requesting refill on celecoxib (CELEBREX) 200 MG capsule [[id number]]  Needs to be sent to [facility], pt is going out of town and needs this called in soon.  Pt will be gone for 3 weeks

## 2014-06-15 MED ORDER — DIAZEPAM 5 MG PO TABS: ORAL_TABLET | ORAL | Status: DC

## 2014-07-23 ENCOUNTER — Encounter (HOSPITAL_BASED_OUTPATIENT_CLINIC_OR_DEPARTMENT_OTHER): Payer: Self-pay | Admitting: *Deleted

## 2014-07-26 ENCOUNTER — Ambulatory Visit (HOSPITAL_BASED_OUTPATIENT_CLINIC_OR_DEPARTMENT_OTHER): Payer: Medicare Other | Admitting: Certified Registered"

## 2014-07-26 ENCOUNTER — Ambulatory Visit (HOSPITAL_BASED_OUTPATIENT_CLINIC_OR_DEPARTMENT_OTHER)
Admission: RE | Admit: 2014-07-26 | Discharge: 2014-07-26 | Disposition: A | Discharge status: Home / Self Care | Payer: Medicare Other | Source: Ambulatory Visit | Attending: Orthopedic Surgery | Admitting: Orthopedic Surgery

## 2014-07-26 ENCOUNTER — Encounter (HOSPITAL_BASED_OUTPATIENT_CLINIC_OR_DEPARTMENT_OTHER): Payer: Self-pay | Admitting: Certified Registered"

## 2014-07-26 ENCOUNTER — Encounter (HOSPITAL_BASED_OUTPATIENT_CLINIC_OR_DEPARTMENT_OTHER)
Admission: RE | Disposition: A | Discharge status: Home / Self Care | Payer: Self-pay | Source: Ambulatory Visit | Attending: Orthopedic Surgery

## 2014-07-26 DIAGNOSIS — Z96651 Presence of right artificial knee joint: Secondary | ICD-10-CM | POA: Insufficient documentation

## 2014-07-26 DIAGNOSIS — M2041 Other hammer toe(s) (acquired), right foot: Secondary | ICD-10-CM | POA: Diagnosis not present

## 2014-07-26 DIAGNOSIS — M79671 Pain in right foot: Secondary | ICD-10-CM | POA: Diagnosis not present

## 2014-07-26 DIAGNOSIS — K219 Gastro-esophageal reflux disease without esophagitis: Secondary | ICD-10-CM | POA: Insufficient documentation

## 2014-07-26 DIAGNOSIS — Z880 Allergy status to penicillin: Secondary | ICD-10-CM | POA: Diagnosis not present

## 2014-07-26 DIAGNOSIS — M1990 Unspecified osteoarthritis, unspecified site: Secondary | ICD-10-CM | POA: Insufficient documentation

## 2014-07-26 DIAGNOSIS — Z8551 Personal history of malignant neoplasm of bladder: Secondary | ICD-10-CM | POA: Diagnosis not present

## 2014-07-26 DIAGNOSIS — M2021 Hallux rigidus, right foot: Secondary | ICD-10-CM | POA: Insufficient documentation

## 2014-07-26 DIAGNOSIS — E049 Nontoxic goiter, unspecified: Secondary | ICD-10-CM | POA: Insufficient documentation

## 2014-07-26 DIAGNOSIS — Z87891 Personal history of nicotine dependence: Secondary | ICD-10-CM | POA: Insufficient documentation

## 2014-07-26 DIAGNOSIS — G8918 Other acute postprocedural pain: Secondary | ICD-10-CM | POA: Diagnosis not present

## 2014-07-26 DIAGNOSIS — Z888 Allergy status to other drugs, medicaments and biological substances status: Secondary | ICD-10-CM | POA: Insufficient documentation

## 2014-07-26 DIAGNOSIS — Z91013 Allergy to seafood: Secondary | ICD-10-CM | POA: Diagnosis not present

## 2014-07-26 DIAGNOSIS — G43909 Migraine, unspecified, not intractable, without status migrainosus: Secondary | ICD-10-CM | POA: Insufficient documentation

## 2014-07-26 DIAGNOSIS — Z7982 Long term (current) use of aspirin: Secondary | ICD-10-CM | POA: Diagnosis not present

## 2014-07-26 DIAGNOSIS — M2011 Hallux valgus (acquired), right foot: Secondary | ICD-10-CM | POA: Diagnosis present

## 2014-07-26 HISTORY — PX: ARTHRODESIS FOOT WITH WEIL OSTEOTOMY: SHX5589

## 2014-07-26 SURGERY — ARTHRODESIS FOOT WITH WEIL OSTEOTOMY
Anesthesia: General | Site: Foot | Laterality: Right

## 2014-07-26 MED ORDER — HYDROMORPHONE HCL 2 MG PO TABS: 2.0000 mg | ORAL_TABLET | ORAL | Status: DC | PRN

## 2014-07-26 MED ORDER — TRAMADOL HCL 50 MG PO TABS: 50.0000 mg | ORAL_TABLET | Freq: Three times a day (TID) | ORAL | Status: DC | PRN

## 2014-07-26 MED ORDER — 0.9 % SODIUM CHLORIDE (POUR BTL) OPTIME
TOPICAL | Status: DC | PRN
  Administered 2014-07-26: 240 mL

## 2014-07-26 MED ORDER — CHLORHEXIDINE GLUCONATE 4 % EX LIQD: 60.0000 mL | Freq: Once | CUTANEOUS | Status: DC

## 2014-07-26 MED ORDER — FENTANYL CITRATE 0.05 MG/ML IJ SOLN
25.0000 ug | INTRAMUSCULAR | Status: DC | PRN
  Administered 2014-07-26: 50 ug via INTRAVENOUS
  Administered 2014-07-26: 25 ug via INTRAVENOUS
  Administered 2014-07-26: 50 ug via INTRAVENOUS
  Administered 2014-07-26: 25 ug via INTRAVENOUS

## 2014-07-26 MED ORDER — BUPIVACAINE-EPINEPHRINE (PF) 0.5% -1:200000 IJ SOLN
INTRAMUSCULAR | Status: AC
  Filled 2014-07-26: qty 30

## 2014-07-26 MED ORDER — BACITRACIN ZINC 500 UNIT/GM EX OINT
TOPICAL_OINTMENT | CUTANEOUS | Status: DC | PRN
  Administered 2014-07-26: 1 via TOPICAL

## 2014-07-26 MED ORDER — MIDAZOLAM HCL 2 MG/2ML IJ SOLN
1.0000 mg | INTRAMUSCULAR | Status: DC | PRN
  Administered 2014-07-26: 1 mg via INTRAVENOUS

## 2014-07-26 MED ORDER — LACTATED RINGERS IV SOLN
INTRAVENOUS | Status: DC
  Administered 2014-07-26 (×2): via INTRAVENOUS

## 2014-07-26 MED ORDER — PROPOFOL 10 MG/ML IV BOLUS
INTRAVENOUS | Status: DC | PRN
  Administered 2014-07-26: 160 mg via INTRAVENOUS

## 2014-07-26 MED ORDER — OXYCODONE HCL 5 MG PO TABS
5.0000 mg | ORAL_TABLET | Freq: Once | ORAL | Status: AC
  Administered 2014-07-26: 5 mg via ORAL

## 2014-07-26 MED ORDER — FENTANYL CITRATE 0.05 MG/ML IJ SOLN
INTRAMUSCULAR | Status: DC | PRN
  Administered 2014-07-26 (×2): 25 ug via INTRAVENOUS

## 2014-07-26 MED ORDER — BACITRACIN ZINC 500 UNIT/GM EX OINT
TOPICAL_OINTMENT | CUTANEOUS | Status: AC
  Filled 2014-07-26: qty 28.35

## 2014-07-26 MED ORDER — KETOROLAC TROMETHAMINE 10 MG PO TABS: 10.0000 mg | ORAL_TABLET | Freq: Four times a day (QID) | ORAL | Status: DC | PRN

## 2014-07-26 MED ORDER — MIDAZOLAM HCL 2 MG/2ML IJ SOLN
INTRAMUSCULAR | Status: AC
  Filled 2014-07-26: qty 2

## 2014-07-26 MED ORDER — FENTANYL CITRATE 0.05 MG/ML IJ SOLN
INTRAMUSCULAR | Status: AC
  Filled 2014-07-26: qty 2

## 2014-07-26 MED ORDER — ROPIVACAINE HCL 5 MG/ML IJ SOLN
INTRAMUSCULAR | Status: DC | PRN
  Administered 2014-07-26: 20 mL via PERINEURAL

## 2014-07-26 MED ORDER — PROMETHAZINE HCL 25 MG/ML IJ SOLN: 6.2500 mg | INTRAMUSCULAR | Status: DC | PRN

## 2014-07-26 MED ORDER — SODIUM CHLORIDE 0.9 % IV SOLN: INTRAVENOUS | Status: DC

## 2014-07-26 MED ORDER — CEFAZOLIN SODIUM-DEXTROSE 2-3 GM-% IV SOLR
INTRAVENOUS | Status: AC
  Filled 2014-07-26: qty 50

## 2014-07-26 MED ORDER — DEXAMETHASONE SODIUM PHOSPHATE 10 MG/ML IJ SOLN
INTRAMUSCULAR | Status: DC | PRN
  Administered 2014-07-26: 10 mg via INTRAVENOUS

## 2014-07-26 MED ORDER — KETOROLAC TROMETHAMINE 30 MG/ML IJ SOLN
INTRAMUSCULAR | Status: AC
  Filled 2014-07-26: qty 1

## 2014-07-26 MED ORDER — FENTANYL CITRATE 0.05 MG/ML IJ SOLN
INTRAMUSCULAR | Status: AC
  Filled 2014-07-26: qty 6

## 2014-07-26 MED ORDER — OXYCODONE HCL 5 MG PO TABS
ORAL_TABLET | ORAL | Status: AC
  Filled 2014-07-26: qty 1

## 2014-07-26 MED ORDER — KETOROLAC TROMETHAMINE 30 MG/ML IJ SOLN
30.0000 mg | Freq: Once | INTRAMUSCULAR | Status: AC | PRN
  Administered 2014-07-26: 30 mg via INTRAVENOUS

## 2014-07-26 MED ORDER — LIDOCAINE HCL (CARDIAC) 20 MG/ML IV SOLN
INTRAVENOUS | Status: DC | PRN
  Administered 2014-07-26: 30 mg via INTRAVENOUS

## 2014-07-26 MED ORDER — ONDANSETRON HCL 4 MG/2ML IJ SOLN
INTRAMUSCULAR | Status: DC | PRN
  Administered 2014-07-26: 4 mg via INTRAVENOUS

## 2014-07-26 MED ORDER — DIAZEPAM 5 MG PO TABS: ORAL_TABLET | ORAL | Status: DC

## 2014-07-26 MED ORDER — ONDANSETRON HCL 4 MG PO TABS: 4.0000 mg | ORAL_TABLET | Freq: Three times a day (TID) | ORAL | Status: DC | PRN

## 2014-07-26 MED ORDER — CEFAZOLIN SODIUM-DEXTROSE 2-3 GM-% IV SOLR
2.0000 g | INTRAVENOUS | Status: AC
  Administered 2014-07-26: 2 g via INTRAVENOUS

## 2014-07-26 MED ORDER — FENTANYL CITRATE 0.05 MG/ML IJ SOLN
50.0000 ug | INTRAMUSCULAR | Status: DC | PRN
  Administered 2014-07-26: 50 ug via INTRAVENOUS

## 2014-07-26 SURGICAL SUPPLY — 89 items
BANDAGE ESMARK 6X9 LF (GAUZE/BANDAGES/DRESSINGS) ×2 IMPLANT
BIT DRILL 1.5X30 QC DISP (BIT) ×4 IMPLANT
BIT DRILL 2.0 (BIT) ×1
BIT DRILL 2.0MM (BIT) ×1
BIT DRILL 2XNS DISP SS SM FRAG (BIT) ×2 IMPLANT
BIT DRL 2XNS DISP SS SM FRAG (BIT) ×2
BLADE AVERAGE 25MMX9MM (BLADE) ×2
BLADE AVERAGE 25X9 (BLADE) ×6 IMPLANT
BLADE MICRO SAGITTAL (BLADE) IMPLANT
BLADE OSC/SAG .038X5.5 CUT EDG (BLADE) IMPLANT
BLADE SURG 15 STRL LF DISP TIS (BLADE) ×4 IMPLANT
BLADE SURG 15 STRL SS (BLADE) ×4
BNDG COHESIVE 4X5 TAN STRL (GAUZE/BANDAGES/DRESSINGS) ×4 IMPLANT
BNDG COHESIVE 6X5 TAN STRL LF (GAUZE/BANDAGES/DRESSINGS) ×4 IMPLANT
BNDG ESMARK 4X9 LF (GAUZE/BANDAGES/DRESSINGS) IMPLANT
BNDG ESMARK 6X9 LF (GAUZE/BANDAGES/DRESSINGS) ×4
CHLORAPREP W/TINT 26ML (MISCELLANEOUS) ×4 IMPLANT
CLOSURE WOUND 1/2 X4 (GAUZE/BANDAGES/DRESSINGS)
COVER BACK TABLE 60X90IN (DRAPES) ×4 IMPLANT
CUFF TOURNIQUET SINGLE 34IN LL (TOURNIQUET CUFF) ×4 IMPLANT
DRAPE C-ARM 42X72 X-RAY (DRAPES) IMPLANT
DRAPE C-ARMOR (DRAPES) IMPLANT
DRAPE EXTREMITY T 121X128X90 (DRAPE) ×4 IMPLANT
DRAPE OEC MINIVIEW 54X84 (DRAPES) ×4 IMPLANT
DRAPE U-SHAPE 47X51 STRL (DRAPES) ×4 IMPLANT
DRSG ADAPTIC 3X8 NADH LF (GAUZE/BANDAGES/DRESSINGS) IMPLANT
DRSG EMULSION OIL 3X3 NADH (GAUZE/BANDAGES/DRESSINGS) ×4 IMPLANT
DRSG PAD ABDOMINAL 8X10 ST (GAUZE/BANDAGES/DRESSINGS) ×8 IMPLANT
ELECT REM PT RETURN 9FT ADLT (ELECTROSURGICAL) ×4
ELECTRODE REM PT RTRN 9FT ADLT (ELECTROSURGICAL) ×2 IMPLANT
GAUZE SPONGE 4X4 12PLY STRL (GAUZE/BANDAGES/DRESSINGS) ×4 IMPLANT
GLOVE BIO SURGEON STRL SZ8 (GLOVE) ×4 IMPLANT
GLOVE BIOGEL PI IND STRL 7.0 (GLOVE) ×2 IMPLANT
GLOVE BIOGEL PI IND STRL 8 (GLOVE) ×2 IMPLANT
GLOVE BIOGEL PI INDICATOR 7.0 (GLOVE) ×2
GLOVE BIOGEL PI INDICATOR 8 (GLOVE) ×2
GLOVE ECLIPSE 6.5 STRL STRAW (GLOVE) ×4 IMPLANT
GLOVE EXAM NITRILE MD LF STRL (GLOVE) ×4 IMPLANT
GOWN STRL REUS W/ TWL LRG LVL3 (GOWN DISPOSABLE) ×2 IMPLANT
GOWN STRL REUS W/ TWL XL LVL3 (GOWN DISPOSABLE) ×2 IMPLANT
GOWN STRL REUS W/TWL LRG LVL3 (GOWN DISPOSABLE) ×2
GOWN STRL REUS W/TWL XL LVL3 (GOWN DISPOSABLE) ×2
K-WIRE ACE 1.6X6 (WIRE) ×4
K-WIRE THREADED 1.25 (WIRE) ×4
KWIRE ACE 1.6X6 (WIRE) ×2 IMPLANT
KWIRE THREADED 1.25 (WIRE) ×2 IMPLANT
NDL SAFETY ECLIPSE 18X1.5 (NEEDLE) IMPLANT
NEEDLE HYPO 18GX1.5 SHARP (NEEDLE)
NEEDLE HYPO 22GX1.5 SAFETY (NEEDLE) IMPLANT
PACK BASIN DAY SURGERY FS (CUSTOM PROCEDURE TRAY) ×4 IMPLANT
PAD CAST 4YDX4 CTTN HI CHSV (CAST SUPPLIES) ×2 IMPLANT
PADDING CAST ABS 4INX4YD NS (CAST SUPPLIES)
PADDING CAST ABS COTTON 4X4 ST (CAST SUPPLIES) IMPLANT
PADDING CAST COTTON 4X4 STRL (CAST SUPPLIES) ×2
PADDING CAST COTTON 6X4 STRL (CAST SUPPLIES) ×4 IMPLANT
PENCIL BUTTON HOLSTER BLD 10FT (ELECTRODE) ×4 IMPLANT
PLATE SM 1/4 TUBULAR 5H (Plate) ×4 IMPLANT
SANITIZER HAND PURELL 535ML FO (MISCELLANEOUS) ×4 IMPLANT
SCREW CANN 4.0X30 (Screw) ×2 IMPLANT
SCREW CANN PT 30X4XCANN NS SM (Screw) ×2 IMPLANT
SCREW CANN PT 4.0X34 (Screw) ×4 IMPLANT
SCREW CORTICAL 2.0X12 (Screw) ×4 IMPLANT
SCREW CORTICAL 2.0X16 (Screw) ×4 IMPLANT
SCREW CORTICAL 2.7MM  14MM (Screw) ×2 IMPLANT
SCREW CORTICAL 2.7MM  18MM (Screw) ×4 IMPLANT
SCREW CORTICAL 2.7MM  20MM (Screw) ×2 IMPLANT
SCREW CORTICAL 2.7MM 14MM (Screw) ×2 IMPLANT
SCREW CORTICAL 2.7MM 18MM (Screw) ×4 IMPLANT
SCREW CORTICAL 2.7MM 20MM (Screw) ×2 IMPLANT
SHEET MEDIUM DRAPE 40X70 STRL (DRAPES) ×4 IMPLANT
SLEEVE SCD COMPRESS KNEE MED (MISCELLANEOUS) ×4 IMPLANT
SPLINT FAST PLASTER 5X30 (CAST SUPPLIES) ×40
SPLINT PLASTER CAST FAST 5X30 (CAST SUPPLIES) ×40 IMPLANT
SPONGE LAP 18X18 X RAY DECT (DISPOSABLE) ×4 IMPLANT
STOCKINETTE 6  STRL (DRAPES) ×2
STOCKINETTE 6 STRL (DRAPES) ×2 IMPLANT
STRIP CLOSURE SKIN 1/2X4 (GAUZE/BANDAGES/DRESSINGS) IMPLANT
SUCTION FRAZIER TIP 10 FR DISP (SUCTIONS) ×4 IMPLANT
SUT ETHILON 3 0 PS 1 (SUTURE) ×4 IMPLANT
SUT MNCRL AB 3-0 PS2 18 (SUTURE) ×4 IMPLANT
SUT VIC AB 0 SH 27 (SUTURE) IMPLANT
SUT VIC AB 2-0 SH 27 (SUTURE) ×2
SUT VIC AB 2-0 SH 27XBRD (SUTURE) ×2 IMPLANT
SUT VICRYL 4-0 PS2 18IN ABS (SUTURE) IMPLANT
SYR BULB 3OZ (MISCELLANEOUS) ×4 IMPLANT
TOWEL OR 17X24 6PK STRL BLUE (TOWEL DISPOSABLE) ×8 IMPLANT
TUBE CONNECTING 20'X1/4 (TUBING) ×1
TUBE CONNECTING 20X1/4 (TUBING) ×3 IMPLANT
UNDERPAD 30X30 INCONTINENT (UNDERPADS AND DIAPERS) ×4 IMPLANT

## 2014-07-26 NOTE — Anesthesia Postprocedure Evaluation (Signed)
Anesthesia Post Note  Patient: Mariah Farrell  Procedure(s) Performed: Procedure(s) (LRB): RIGHT HALLUX METATARSOPHALANGEAL JOINT ARTHRODESIS; RIGHT SECOND METATARSAL WEIL OSTEOTOMY; DORSAL CAPSULOTOMY EXTENSOR TENDON LENGTHENING (Right)  Anesthesia type: General  Patient location: PACU  Post pain: Pain level controlled and Adequate analgesia  Post assessment: Post-op Vital signs reviewed, Patient's Cardiovascular Status Stable, Respiratory Function Stable, Patent Airway and Pain level controlled  Last Vitals:  Filed Vitals:   07/26/14 1415  BP:   Pulse: 64  Temp:   Resp: 15    Post vital signs: Reviewed and stable  Level of consciousness: awake, alert  and oriented  Complications: No apparent anesthesia complications

## 2014-07-26 NOTE — Brief Op Note (Signed)
07/26/2014  1:17 PM  PATIENT:  LONIA ROANE  74 y.o. female  PRE-OPERATIVE DIAGNOSIS:  right hallux valgus, hallux rigidus and recurrent right second hammer toe  POST-OPERATIVE DIAGNOSIS:  same  Procedure(s): 1.  Right foot removal of deep implants 2.  Right hallux mpj arthrodesis 3.  Right Silver bunionectomy 4.  Right 2nd MT weil osteotomy (separate incision) 5.  Right 2nd mtpj dorsal capsulotomy and extensor tendon lengthening 6.  Right foot AP an dlateral xrays  SURGEON:  Wylene Simmer, MD  ASSISTANT: n/a  ANESTHESIA:   General, regional  EBL:  minimal   TOURNIQUET:   Total Tourniquet Time Documented: Thigh (Right) - 53 minutes Total: Thigh (Right) - 53 minutes  COMPLICATIONS:  None apparent  DISPOSITION:  Extubated, awake and stable to recovery.  DICTATION ID:  [id number]

## 2014-07-26 NOTE — Progress Notes (Signed)
Assisted Dr. Rose with right, ultrasound guided, popliteal block. Side rails up, monitors on throughout procedure. See vital signs in flow sheet. Tolerated Procedure well. °

## 2014-07-26 NOTE — Anesthesia Preprocedure Evaluation (Signed)
Anesthesia Evaluation  Patient identified by MRN, date of birth, ID band Patient awake    Reviewed: Allergy & Precautions, NPO status , Patient's Chart, lab work & pertinent test results  History of Anesthesia Complications (+) PONV  Airway Mallampati: II  TM Distance: >3 FB Neck ROM: Full    Dental no notable dental hx.    Pulmonary neg pulmonary ROS, former smoker,  breath sounds clear to auscultation  Pulmonary exam normal       Cardiovascular negative cardio ROS  Rhythm:Regular Rate:Normal     Neuro/Psych negative neurological ROS  negative psych ROS   GI/Hepatic Neg liver ROS, GERD-  Medicated,  Endo/Other  negative endocrine ROS  Renal/GU negative Renal ROS  negative genitourinary   Musculoskeletal negative musculoskeletal ROS (+)   Abdominal   Peds negative pediatric ROS (+)  Hematology negative hematology ROS (+)   Anesthesia Other Findings   Reproductive/Obstetrics negative OB ROS                             Anesthesia Physical Anesthesia Plan  ASA: II  Anesthesia Plan: General   Post-op Pain Management:    Induction: Intravenous  Airway Management Planned: LMA  Additional Equipment:   Intra-op Plan:   Post-operative Plan: Extubation in OR  Informed Consent: I have reviewed the patients History and Physical, chart, labs and discussed the procedure including the risks, benefits and alternatives for the proposed anesthesia with the patient or authorized representative who has indicated his/her understanding and acceptance.   Dental advisory given  Plan Discussed with: CRNA and Surgeon  Anesthesia Plan Comments:         Anesthesia Quick Evaluation

## 2014-07-26 NOTE — Transfer of Care (Signed)
Immediate Anesthesia Transfer of Care Note  Patient: Mariah Farrell  Procedure(s) Performed: Procedure(s): RIGHT HALLUX METATARSOPHALANGEAL JOINT ARTHRODESIS; RIGHT SECOND METATARSAL WEIL OSTEOTOMY; DORSAL CAPSULOTOMY EXTENSOR TENDON LENGTHENING (Right)  Patient Location: PACU  Anesthesia Type:GA combined with regional for post-op pain  Level of Consciousness: awake, alert , oriented and patient cooperative  Airway & Oxygen Therapy: Patient Spontanous Breathing and Patient connected to face mask oxygen  Post-op Assessment: Report given to RN and Post -op Vital signs reviewed and stable  Post vital signs: Reviewed and stable  Last Vitals:  Filed Vitals:   07/26/14 1115  BP: 108/57  Pulse: 62  Temp:   Resp: 18    Complications: No apparent anesthesia complications

## 2014-07-26 NOTE — Discharge Instructions (Signed)
Wylene Simmer, MD [place]  Please read the following information regarding your care after surgery.  Medications  You only need a prescription for the narcotic pain medicine (ex. oxycodone, Percocet, Norco).  All of the other medicines listed below are available over the counter. X acetominophen (Tylenol) 650 mg every 4-6 hours as you need for minor pain X dilaudid as prescribed for moderate to severe pain.  Switch to tramadol as quickly as possible. X  Zofran as prescribed for nausea    X  Toradol for no more than two days.   Narcotic pain medicine (ex. oxycodone, Percocet, Vicodin) will cause constipation.  To prevent this problem, take the following medicines while you are taking any pain medicine. X docusate sodium (Colace) 100 mg twice a day X senna (Senokot) 2 tablets twice a day  X To help prevent blood clots, take an aspirin (325 mg) once a day for a month after surgery.  You should also get up every hour while you are awake to move around.    Weight Bearing ? Bear weight when you are able on your operated leg or foot. ? Bear weight only on the heel of your operated foot in the post-op shoe. X Do not bear any weight on the operated leg or foot.  Cast / Splint / Dressing X Keep your splint or cast clean and dry.  Dont put anything (coat hanger, pencil, etc) down inside of it.  If it gets damp, use a hair dryer on the cool setting to dry it.  If it gets soaked, call the office to schedule an appointment for a cast change. ? Remove your dressing 3 days after surgery and cover the incisions with dry dressings.    After your dressing, cast or splint is removed; you may shower, but do not soak or scrub the wound.  Allow the water to run over it, and then gently pat it dry.  Swelling It is normal for you to have swelling where you had surgery.  To reduce swelling and pain, keep your toes above your nose for at least 3 days after surgery.  It may be necessary to keep your  foot or leg elevated for several weeks.  If it hurts, it should be elevated.  Follow Up Call my office at [phone] when you are discharged from the hospital or surgery center to schedule an appointment to be seen two weeks after surgery.  Call my office at [phone] if you develop a fever >101.5 F, nausea, vomiting, bleeding from the surgical site or severe pain.      Post Anesthesia Home Care Instructions  Activity: Get plenty of rest for the remainder of the day. A responsible adult should stay with you for 24 hours following the procedure.  For the next 24 hours, DO NOT: -Drive a car -Paediatric nurse -Drink alcoholic beverages -Take any medication unless instructed by your physician -Make any legal decisions or sign important papers.  Meals: Start with liquid foods such as gelatin or soup. Progress to regular foods as tolerated. Avoid greasy, spicy, heavy foods. If nausea and/or vomiting occur, drink only clear liquids until the nausea and/or vomiting subsides. Call your physician if vomiting continues.  Special Instructions/Symptoms: Your throat may feel dry or sore from the anesthesia or the breathing tube placed in your throat during surgery. If this causes discomfort, gargle with warm salt water. The discomfort should disappear within 24 hours. Regional Anesthesia Blocks  1. Numbness or the inability to move  the "blocked" extremity may last from 3-48 hours after placement. The length of time depends on the medication injected and your individual response to the medication. If the numbness is not going away after 48 hours, call your surgeon.  2. The extremity that is blocked will need to be protected until the numbness is gone and the  Strength has returned. Because you cannot feel it, you will need to take extra care to avoid injury. Because it may be weak, you may have difficulty moving it or using it. You may not know what position it is in without looking at it while  the block is in effect.  3. For blocks in the legs and feet, returning to weight bearing and walking needs to be done carefully. You will need to wait until the numbness is entirely gone and the strength has returned. You should be able to move your leg and foot normally before you try and bear weight or walk. You will need someone to be with you when you first try to ensure you do not fall and possibly risk injury.  4. Bruising and tenderness at the needle site are common side effects and will resolve in a few days.  5. Persistent numbness or new problems with movement should be communicated to the surgeon or the [place] [phone] [place] [phone]).

## 2014-07-26 NOTE — H&P (Signed)
Mariah Farrell is an 74 y.o. female.   Chief Complaint: right forefoot pain HPI: 74 y/o female with right hallux rigidus, hallux valgus and recurrent 2nd hammertoe deformity.  She has failed non op treatment and presents today for surgical treatment.  Past Medical History  Diagnosis Date  . Goiter     synthroid used to shrink  . GERD (gastroesophageal reflux disease)   . Arthritis   . PONV (postoperative nausea and vomiting)   . H/O hiatal hernia   . Headache(784.0)     migraines in morning  . Cancer 13 years ago    bladder  . Seasonal allergies     Past Surgical History  Procedure Laterality Date  . Rotator cuff repair Bilateral ~2010, 2012  . Bunionectomy Bilateral   . Bladder surgery  13 years ago  . Total knee arthroplasty Right 07/19/2012    Procedure: TOTAL KNEE ARTHROPLASTY;  Surgeon: Mauri Pole, MD;  Location: [facility];  Service: Orthopedics;  Laterality: Right;  . Tonsillectomy  as child  . Lumbar fusion  May 2014    L4-L5  . Total knee arthroplasty Left 08/01/2013    Procedure: LEFT TOTAL KNEE ARTHROPLASTY;  Surgeon: Mauri Pole, MD;  Location: [facility];  Service: Orthopedics;  Laterality: Left;  . Colonoscopy      Family History  Problem Relation Age of Onset  . Cancer Brother 3    pancreatic   Social History:  reports that she quit smoking about 50 years ago. Her smoking use included Cigarettes. She has a 1.25 pack-year smoking history. She has never used smokeless tobacco. She reports that she drinks alcohol. She reports that she does not use illicit drugs.  Allergies:  Allergies  Allergen Reactions  . Crab [Shellfish Allergy]   . Esomeprazole Magnesium     palpitations  . Penicillins     Medications Prior to Admission  Medication Sig Dispense Refill  . aspirin EC 325 MG EC tablet Take 1 tablet (325 mg total) by mouth 2 (two) times daily. 60 tablet 0  . Biotin 5000 MCG TABS Take 1 tablet by mouth daily.    . calcium-vitamin D (OSCAL WITH D)  500-200 MG-UNIT per tablet Take 1 tablet by mouth daily.    . celecoxib (CELEBREX) 200 MG capsule Take 1 capsule (200 mg total) by mouth daily. 90 capsule 1  . cetirizine (ZYRTEC) 10 MG tablet Take 10 mg by mouth daily.    . diazepam (VALIUM) 5 MG tablet TAKE 1/2 TO 1 TABLET BY MOUTH EVERY 12 HOURS AS NEEDED FOR ANXIETY OR SLEEP 60 tablet 5  . gabapentin (NEURONTIN) 300 MG capsule Take 300 mg by mouth at bedtime as needed. Back pain    . glucosamine-chondroitin 500-400 MG tablet Take 1 tablet by mouth daily.    . lansoprazole (PREVACID) 15 MG capsule Take 15 mg by mouth daily.    [name] levothyroxine (SYNTHROID, LEVOTHROID) 25 MCG tablet TAKE 1 TABLET BY MOUTH EVERY DAY BEFORE BREAKFAST 90 tablet 3  . magnesium oxide (MAG-OX) 400 MG tablet Take 400 mg by mouth daily.    . Melatonin 3 MG TABS Take 1 tablet by mouth at bedtime as needed. sleep    . Multiple Vitamin (MULTIVITAMIN) tablet Take 1 tablet by mouth daily.    . traMADol (ULTRAM) 50 MG tablet Take 1-2 tablets (50-100 mg total) by mouth every 6 (six) hours as needed. 80 tablet 0  . vitamin B-12 (CYANOCOBALAMIN) 250 MCG tablet Take 250 mcg by  mouth daily.    . vitamin C (ASCORBIC ACID) 500 MG tablet Take 1,000 mg by mouth daily.     [name] alendronate (FOSAMAX) 70 MG tablet Take 1 tablet (70 mg total) by mouth every 7 (seven) days. Take with a full glass of water on an empty stomach. (Patient not taking: Reported on 07/23/2014) 4 tablet 11  . benzonatate (TESSALON) 100 MG capsule Take 1 capsule (100 mg total) by mouth 3 (three) times daily as needed for cough. 30 capsule 0  . dextromethorphan-guaiFENesin (MUCINEX DM) 30-600 MG per 12 hr tablet Take 1 tablet by mouth 2 (two) times daily.    . fluticasone (FLONASE) 50 MCG/ACT nasal spray Place 2 sprays into both nostrils 2 (two) times daily. 16 g 0    No results found for this or any previous visit (from the past 48 hour(s)). No results found.  ROS  No recent f/c/n/v/wt loss  Blood pressure 108/57,  pulse 62, temperature 97.8 F (36.6 C), temperature source Oral, resp. rate 18, height 5\' 2"  (1.575 m), weight 86.637 kg (191 lb), SpO2 100 %. Physical Exam  wn wd woman in nad.  Alert and oriented x 4.  Mood and affect normal.  EOMI.  resp unlabored.  R foot with 2nd hammertoe, hallux valgus and decreased ROM at hallux mpj.  No lymphadenoapthy.  NVI.  Assessment/Plan R hallux rigidus, hallux valgus and recurrent 2nd hammertoe - to OR for hallux MPJ arthrodesis, bunionectomy, 2nd MT weil osteotomy and dorsal capsulotomy.  The risks and benefits of the alternative treatment options have been discussed in detail.  The patient wishes to proceed with surgery and specifically understands risks of bleeding, infection, nerve damage, blood clots, need for additional surgery, amputation and death.   Wylene Simmer 07-29-2014, 11:40 AM

## 2014-07-26 NOTE — Anesthesia Procedure Notes (Addendum)
Anesthesia Regional Block:  Popliteal block  Pre-Anesthetic Checklist: ,, timeout performed, Correct Patient, Correct Site, Correct Laterality, Correct Procedure, Correct Position, site marked, Risks and benefits discussed,  Surgical consent,  Pre-op evaluation,  At surgeon's request and post-op pain management   Prep: chloraprep       Needles:  Injection technique: Single-shot  Needle Type: Echogenic Stimulator Needle     Needle Length: 9cm 9 cm Needle Gauge: 21 and 21 G    Additional Needles:  Procedures: ultrasound guided (picture in chart) Popliteal block Narrative:  Injection made incrementally with aspirations every 5 mL.  Performed by: Personally   Additional Notes: Patient tolerated the procedure well without complications   Procedure Name: LMA Insertion Date/Time: 07/26/2014 11:49 AM Performed by: Tamari Busic Pre-anesthesia Checklist: Patient identified, Emergency Drugs available, Suction available and Patient being monitored Patient Re-evaluated:Patient Re-evaluated prior to inductionOxygen Delivery Method: Circle System Utilized Preoxygenation: Pre-oxygenation with 100% oxygen Intubation Type: IV induction Ventilation: Mask ventilation without difficulty LMA: LMA inserted LMA Size: 4.0 Number of attempts: 1 Airway Equipment and Method: Bite block Placement Confirmation: positive ETCO2 Tube secured with: Tape Dental Injury: Teeth and Oropharynx as per pre-operative assessment

## 2014-07-27 NOTE — Op Note (Signed)
NAME:  Mariah Farrell, Mariah Farrell NO.:  [id number]  MEDICAL RECORD NO.:  [id number]  LOCATION:                                 FACILITY:  PHYSICIAN:  Wylene Simmer, MD             DATE OF BIRTH:  DATE OF PROCEDURE:  07/26/2014 DATE OF DISCHARGE:                              OPERATIVE REPORT   PREOPERATIVE DIAGNOSIS: 1. Right hallux valgus. 2. Right hallux rigidus. 3. Recurrent right second hammertoe deformity.  POSTOPERATIVE DIAGNOSIS: 1. Right hallux valgus. 2. Right hallux rigidus. 3. Recurrent right second hammertoe deformity.  PROCEDURE: 1. Removal of deep implants from the right foot, first metatarsal. 2. Right hallux MP joint arthrodesis. 3. Right foot silver bunionectomy. 4. Right second metatarsal Weil osteotomy through a separate incision. 5. Right second MTP joint dorsal capsulotomy and extensor tendon     lengthening. 6. Right foot AP and lateral radiographs.  SURGEON:  Wylene Simmer, MD  ANESTHESIA:  General, regional.  ESTIMATED BLOOD LOSS:  Minimal.  TIME OF TOURNIQUET:  53 minutes at 250 mmHg.  COMPLICATIONS:  None apparent.  DISPOSITION:  Extubated, awake and stable to recovery.  INDICATIONS FOR PROCEDURE:  The patient is a 74 year old female who has long history of a painful right foot deformity.  She has had previous bunion and hammertoe corrections, both of recurrent.  She developed considerable arthritis within the hallux MP joint.  She presents now for hallux MP joint arthrodesis, bunionectomy, and second metatarsal Weil osteotomy with dorsal capsulotomy and extensor tendon lengthening.  She understands the risks and benefits, the alternative treatment options, and elects surgical treatment.  She specifically understands risks of bleeding, infection, nerve damage, blood clots, need for additional surgery, continued pain, recurrence of deformities, amputation, and death.  PROCEDURE IN DETAIL:  After preoperative consent was obtained and  the correct operative site was identified, the patient was brought to the operating room and placed supine on the operating table.  General anesthesia was induced.  Preoperative antibiotics were administered. Surgical time-out was taken.  Right lower extremity was prepped and draped in standard sterile fashion with the tourniquet around the thigh. The extremity was exsanguinated and the tourniquet was inflated to 250 mmHg.  A longitudinal incision was then made over the hallux MP joint. Sharp dissection was carried down through the skin and subcutaneous tissue.  The extensor tendons were retracted and protected.  The collateral ligaments were released.  The metatarsal head was exposed.  A convex reamer was used to remove the remaining articular cartilage and peripheral osteophytes.  The convex reamer was then used to ream away the remaining articular cartilage at the base of the proximal phalanx. Wound was irrigated copiously.  The patient was noted to have significantly hypertrophic medial eminence.  This bunion was resected in line with the first metatarsal shaft and the cut surfaces of bone smoothed with a rasp.  The hallux MP joint was then reduced and provisionally pinned.  AP and lateral radiographs confirmed appropriate reduction of the joint.  A 4 mm partially threaded cannulated screw from the [employer] new 4-0 cannulated screw set was inserted and noted to have excellent purchase.  A 5-hole  one-quarter tubular plate from the [employer] Mini frag set was then applied to the dorsum of the joint and fixed proximally and distally with 2 bicortical fully threaded 2.7 mm screws. AP and lateral radiographs confirmed appropriate reduction of the joint, appropriate position and length of all hardware.  Wound was irrigated copiously.  The retinaculum was closed over the extensor tendons with simple sutures of 2-0 Vicryl.  The skin incision was closed with a running 3-0 nylon.  Attention was  then turned to the second toe where longitudinal incision was made over the MTP joint.  Sharp dissection was carried down through the skin and subcutaneous tissues.  The extensor tendons were identified.  Both the longus and brevis tendons were lengthened in Z- fashion and pulled out of the way.  The dorsal joint capsule was then excised in its entirety allowing the toe to reduce to a neutral position.  The metatarsal head was then exposed.  An oscillating saw with stacked blades was used to make an oblique osteotomy.  The head of the metatarsal was allowed to retract proximally several millimeters and was fixed with a 2 mm [employer] Mini frag screw.  The overhanging bone was trimmed with a rongeur.  The extensor tendons were then repaired with simple sutures of Vicryl.  Monocryl was used to close the subcutaneous, and the skin was closed with nylon.  Sterile dressings were applied after AP and lateral radiographs confirmed appropriate position and length of all hardware.  Short-leg splint was then applied.  The patient's tourniquet was released at 53 minutes after application of the dressings.  The patient was awakened from anesthesia and transported to the recovery room in stable condition.  FOLLOWUP PLAN:  The patient will be nonweightbearing on the right lower extremity.  She will follow up with me in 2 weeks for suture removal and conversion to a CAM walker boot.  RADIOGRAPHS:  AP and lateral radiographs of the right foot were obtained intraoperatively.  These show interval arthrodesis of the hallux MP joint and bunionectomy.  She also has interval shortening of the second metatarsal.  Hardware is all appropriately positioned and of the appropriate length.     Wylene Simmer, MD     JH/MEDQ  D:  07/26/2014  T:  07/27/2014  Job:  [id number]

## 2014-07-30 ENCOUNTER — Encounter (HOSPITAL_BASED_OUTPATIENT_CLINIC_OR_DEPARTMENT_OTHER): Payer: Self-pay | Admitting: Orthopedic Surgery

## 2014-07-30 LAB — POCT HEMOGLOBIN-HEMACUE: HEMOGLOBIN: 13.7 g/dL (ref 12.0–15.0)

## 2014-08-08 DIAGNOSIS — Z4789 Encounter for other orthopedic aftercare: Secondary | ICD-10-CM | POA: Diagnosis not present

## 2014-08-08 DIAGNOSIS — M2011 Hallux valgus (acquired), right foot: Secondary | ICD-10-CM | POA: Diagnosis not present

## 2014-08-22 DIAGNOSIS — M2011 Hallux valgus (acquired), right foot: Secondary | ICD-10-CM | POA: Diagnosis not present

## 2014-08-22 DIAGNOSIS — Z4789 Encounter for other orthopedic aftercare: Secondary | ICD-10-CM | POA: Diagnosis not present

## 2014-08-23 DIAGNOSIS — L820 Inflamed seborrheic keratosis: Secondary | ICD-10-CM | POA: Diagnosis not present

## 2014-08-23 DIAGNOSIS — L814 Other melanin hyperpigmentation: Secondary | ICD-10-CM | POA: Diagnosis not present

## 2014-08-23 DIAGNOSIS — D2262 Melanocytic nevi of left upper limb, including shoulder: Secondary | ICD-10-CM | POA: Diagnosis not present

## 2014-08-23 DIAGNOSIS — D1801 Hemangioma of skin and subcutaneous tissue: Secondary | ICD-10-CM | POA: Diagnosis not present

## 2014-08-23 DIAGNOSIS — D2272 Melanocytic nevi of left lower limb, including hip: Secondary | ICD-10-CM | POA: Diagnosis not present

## 2014-08-23 DIAGNOSIS — L821 Other seborrheic keratosis: Secondary | ICD-10-CM | POA: Diagnosis not present

## 2014-08-23 DIAGNOSIS — D225 Melanocytic nevi of trunk: Secondary | ICD-10-CM | POA: Diagnosis not present

## 2014-08-23 DIAGNOSIS — D2261 Melanocytic nevi of right upper limb, including shoulder: Secondary | ICD-10-CM | POA: Diagnosis not present

## 2014-09-05 DIAGNOSIS — Z4789 Encounter for other orthopedic aftercare: Secondary | ICD-10-CM | POA: Diagnosis not present

## 2014-09-05 DIAGNOSIS — M2011 Hallux valgus (acquired), right foot: Secondary | ICD-10-CM | POA: Diagnosis not present

## 2014-10-03 DIAGNOSIS — Z4789 Encounter for other orthopedic aftercare: Secondary | ICD-10-CM | POA: Diagnosis not present

## 2014-10-03 DIAGNOSIS — M2011 Hallux valgus (acquired), right foot: Secondary | ICD-10-CM | POA: Diagnosis not present

## 2014-10-09 DIAGNOSIS — H52203 Unspecified astigmatism, bilateral: Secondary | ICD-10-CM | POA: Diagnosis not present

## 2014-10-09 DIAGNOSIS — H40053 Ocular hypertension, bilateral: Secondary | ICD-10-CM | POA: Diagnosis not present

## 2014-10-09 DIAGNOSIS — H40013 Open angle with borderline findings, low risk, bilateral: Secondary | ICD-10-CM | POA: Diagnosis not present

## 2014-10-17 ENCOUNTER — Other Ambulatory Visit: Payer: Self-pay | Admitting: Internal Medicine

## 2014-10-18 MED ORDER — DIAZEPAM 5 MG PO TABS: 2.5000 mg | ORAL_TABLET | Freq: Two times a day (BID) | ORAL | Status: DC | PRN

## 2014-10-18 NOTE — Addendum Note (Signed)
Addended by: Lowella Dandy on: 10/18/2014 10:27 AM   Modules accepted: Orders

## 2014-11-26 DIAGNOSIS — Z1231 Encounter for screening mammogram for malignant neoplasm of breast: Secondary | ICD-10-CM | POA: Diagnosis not present

## 2014-11-29 DIAGNOSIS — N6002 Solitary cyst of left breast: Secondary | ICD-10-CM | POA: Diagnosis not present

## 2014-11-29 DIAGNOSIS — N6001 Solitary cyst of right breast: Secondary | ICD-10-CM | POA: Diagnosis not present

## 2014-12-20 ENCOUNTER — Ambulatory Visit (INDEPENDENT_AMBULATORY_CARE_PROVIDER_SITE_OTHER): Payer: Medicare Other | Admitting: Internal Medicine

## 2014-12-20 ENCOUNTER — Encounter: Payer: Self-pay | Admitting: Internal Medicine

## 2014-12-20 ENCOUNTER — Other Ambulatory Visit (INDEPENDENT_AMBULATORY_CARE_PROVIDER_SITE_OTHER): Payer: Medicare Other

## 2014-12-20 VITALS — BP 110/70 | HR 72 | Temp 98.1°F | Ht 62.0 in | Wt 194.8 lb

## 2014-12-20 DIAGNOSIS — R195 Other fecal abnormalities: Secondary | ICD-10-CM | POA: Diagnosis not present

## 2014-12-20 DIAGNOSIS — E559 Vitamin D deficiency, unspecified: Secondary | ICD-10-CM | POA: Diagnosis not present

## 2014-12-20 DIAGNOSIS — M810 Age-related osteoporosis without current pathological fracture: Secondary | ICD-10-CM

## 2014-12-20 DIAGNOSIS — R202 Paresthesia of skin: Secondary | ICD-10-CM | POA: Diagnosis not present

## 2014-12-20 DIAGNOSIS — R209 Unspecified disturbances of skin sensation: Secondary | ICD-10-CM | POA: Diagnosis not present

## 2014-12-20 DIAGNOSIS — Z0000 Encounter for general adult medical examination without abnormal findings: Secondary | ICD-10-CM

## 2014-12-20 DIAGNOSIS — M542 Cervicalgia: Secondary | ICD-10-CM

## 2014-12-20 DIAGNOSIS — E785 Hyperlipidemia, unspecified: Secondary | ICD-10-CM

## 2014-12-20 DIAGNOSIS — M5440 Lumbago with sciatica, unspecified side: Secondary | ICD-10-CM

## 2014-12-20 LAB — URINALYSIS, ROUTINE W REFLEX MICROSCOPIC
BILIRUBIN URINE: NEGATIVE
Hgb urine dipstick: NEGATIVE
KETONES UR: NEGATIVE
Nitrite: NEGATIVE
PH: 6 (ref 5.0–8.0)
RBC / HPF: NONE SEEN (ref 0–?)
Specific Gravity, Urine: 1.02 (ref 1.000–1.030)
Total Protein, Urine: NEGATIVE
URINE GLUCOSE: NEGATIVE
Urobilinogen, UA: 0.2 (ref 0.0–1.0)

## 2014-12-20 LAB — CBC WITH DIFFERENTIAL/PLATELET
BASOS ABS: 0 10*3/uL (ref 0.0–0.1)
Basophils Relative: 0.4 % (ref 0.0–3.0)
EOS ABS: 0.2 10*3/uL (ref 0.0–0.7)
EOS PCT: 5.5 % — AB (ref 0.0–5.0)
HCT: 41.7 % (ref 36.0–46.0)
Hemoglobin: 14.3 g/dL (ref 12.0–15.0)
LYMPHS ABS: 1.1 10*3/uL (ref 0.7–4.0)
Lymphocytes Relative: 25.8 % (ref 12.0–46.0)
MCHC: 34.3 g/dL (ref 30.0–36.0)
MCV: 94.4 fl (ref 78.0–100.0)
Monocytes Absolute: 0.5 10*3/uL (ref 0.1–1.0)
Monocytes Relative: 11.8 % (ref 3.0–12.0)
NEUTROS ABS: 2.4 10*3/uL (ref 1.4–7.7)
Neutrophils Relative %: 56.5 % (ref 43.0–77.0)
Platelets: 262 10*3/uL (ref 150.0–400.0)
RBC: 4.41 Mil/uL (ref 3.87–5.11)
RDW: 13.4 % (ref 11.5–15.5)
WBC: 4.2 10*3/uL (ref 4.0–10.5)

## 2014-12-20 LAB — BASIC METABOLIC PANEL
BUN: 28 mg/dL — AB (ref 6–23)
CO2: 29 mEq/L (ref 19–32)
CREATININE: 1 mg/dL (ref 0.40–1.20)
Calcium: 9.5 mg/dL (ref 8.4–10.5)
Chloride: 107 mEq/L (ref 96–112)
GFR: 57.29 mL/min — AB (ref >60.00)
GLUCOSE: 95 mg/dL (ref 70–99)
POTASSIUM: 4.3 meq/L (ref 3.5–5.1)
Sodium: 142 mEq/L (ref 135–145)

## 2014-12-20 LAB — HEPATIC FUNCTION PANEL
ALT: 23 U/L (ref 0–35)
AST: 25 U/L (ref 0–37)
Albumin: 4.2 g/dL (ref 3.5–5.2)
Alkaline Phosphatase: 79 U/L (ref 39–117)
Bilirubin, Direct: 0.1 mg/dL (ref 0.0–0.3)
Total Bilirubin: 0.7 mg/dL (ref 0.2–1.2)
Total Protein: 6.6 g/dL (ref 6.0–8.3)

## 2014-12-20 LAB — URIC ACID: Uric Acid, Serum: 5.6 mg/dL (ref 2.4–7.0)

## 2014-12-20 LAB — LIPID PANEL
CHOL/HDL RATIO: 3
Cholesterol: 201 mg/dL — ABNORMAL HIGH (ref 0–200)
HDL: 60 mg/dL (ref >39.00)
LDL Cholesterol: 115 mg/dL — ABNORMAL HIGH (ref 0–99)
NonHDL: 141
TRIGLYCERIDES: 129 mg/dL (ref 0.0–149.0)
VLDL: 25.8 mg/dL (ref 0.0–40.0)

## 2014-12-20 LAB — VITAMIN B12: Vitamin B-12: >1500 pg/mL — ABNORMAL HIGH (ref 211–911)

## 2014-12-20 LAB — TSH: TSH: 1.29 u[IU]/mL (ref 0.35–4.50)

## 2014-12-20 LAB — POCT CBG MONITORING: Glucose, fingerstick: 92

## 2014-12-20 LAB — SEDIMENTATION RATE: Sed Rate: 14 mm/hr (ref 0–22)

## 2014-12-20 LAB — VITAMIN D 25 HYDROXY (VIT D DEFICIENCY, FRACTURES): VITD: 51.92 ng/mL (ref 30.00–100.00)

## 2014-12-20 MED ORDER — CELEBREX 200 MG PO CAPS: 200.0000 mg | ORAL_CAPSULE | Freq: Every day | ORAL | Status: DC

## 2014-12-20 NOTE — Assessment & Plan Note (Addendum)
B hands/UE - CTS vs radiculopathy - worse Labs C spine MRI

## 2014-12-20 NOTE — Assessment & Plan Note (Signed)
Chronic  Better post back surgery - fusion L4-5 Dr [place] Celebrex qd

## 2014-12-20 NOTE — Progress Notes (Signed)
Subjective:  Patient ID: Mariah Farrell, female    DOB: Mar 31, 1941  Age: 74 y.o. MRN: [id number]  CC: No chief complaint on file.   HPI Mariah Farrell presents for a well exam C/o B hands go numb w/walking, reading; numbness can go above the elbows; no weakness C/o loose stools after meals - chronic  Outpatient Prescriptions Prior to Visit  Medication Sig Dispense Refill  . aspirin EC 325 MG EC tablet Take 1 tablet (325 mg total) by mouth 2 (two) times daily. 60 tablet 0  . Biotin 5000 MCG TABS Take 1 tablet by mouth daily.    . calcium-vitamin D (OSCAL WITH D) 500-200 MG-UNIT per tablet Take 1 tablet by mouth daily.    . cetirizine (ZYRTEC) 10 MG tablet Take 10 mg by mouth daily.    . diazepam (VALIUM) 5 MG tablet Take 0.5-1 tablets (2.5-5 mg total) by mouth every 12 (twelve) hours as needed for anxiety. 60 tablet 3  . gabapentin (NEURONTIN) 300 MG capsule Take 300 mg by mouth at bedtime as needed. Back pain    . glucosamine-chondroitin 500-400 MG tablet Take 1 tablet by mouth daily.    . lansoprazole (PREVACID) 15 MG capsule Take 15 mg by mouth daily.    [name] levothyroxine (SYNTHROID, LEVOTHROID) 25 MCG tablet TAKE 1 TABLET BY MOUTH EVERY DAY BEFORE BREAKFAST 90 tablet 3  . magnesium oxide (MAG-OX) 400 MG tablet Take 400 mg by mouth daily.    . Melatonin 3 MG TABS Take 1 tablet by mouth at bedtime as needed. sleep    . Multiple Vitamin (MULTIVITAMIN) tablet Take 1 tablet by mouth daily.    . traMADol (ULTRAM) 50 MG tablet Take 1-2 tablets (50-100 mg total) by mouth every 8 (eight) hours as needed for moderate pain. 80 tablet 0  . vitamin B-12 (CYANOCOBALAMIN) 250 MCG tablet Take 250 mcg by mouth daily.    . vitamin C (ASCORBIC ACID) 500 MG tablet Take 1,000 mg by mouth daily.     [name] alendronate (FOSAMAX) 70 MG tablet Take 1 tablet (70 mg total) by mouth every 7 (seven) days. Take with a full glass of water on an empty stomach. 4 tablet 11  . benzonatate (TESSALON) 100 MG capsule  Take 1 capsule (100 mg total) by mouth 3 (three) times daily as needed for cough. 30 capsule 0  . dextromethorphan-guaiFENesin (MUCINEX DM) 30-600 MG per 12 hr tablet Take 1 tablet by mouth 2 (two) times daily.    . fluticasone (FLONASE) 50 MCG/ACT nasal spray Place 2 sprays into both nostrils 2 (two) times daily. 16 g 0  . HYDROmorphone (DILAUDID) 2 MG tablet Take 1 tablet (2 mg total) by mouth every 4 (four) hours as needed for severe pain. 20 tablet 0  . ketorolac (TORADOL) 10 MG tablet Take 1 tablet (10 mg total) by mouth every 6 (six) hours as needed for moderate pain. 10 tablet 0  . ondansetron (ZOFRAN) 4 MG tablet Take 1 tablet (4 mg total) by mouth every 8 (eight) hours as needed for nausea or vomiting. 20 tablet 0   No facility-administered medications prior to visit.   Past Medical History  Diagnosis Date  . Goiter     synthroid used to shrink  . GERD (gastroesophageal reflux disease)   . Arthritis   . PONV (postoperative nausea and vomiting)   . H/O hiatal hernia   . Headache(784.0)     migraines in morning  . Cancer 13 years ago  bladder  . Seasonal allergies    Past Surgical History  Procedure Laterality Date  . Rotator cuff repair Bilateral ~2010, 2012  . Bunionectomy Bilateral   . Bladder surgery  13 years ago  . Total knee arthroplasty Right 07/19/2012    Procedure: TOTAL KNEE ARTHROPLASTY;  Surgeon: Mauri Pole, MD;  Location: [facility];  Service: Orthopedics;  Laterality: Right;  . Tonsillectomy  as child  . Lumbar fusion  May 2014    L4-L5  . Total knee arthroplasty Left 08/01/2013    Procedure: LEFT TOTAL KNEE ARTHROPLASTY;  Surgeon: Mauri Pole, MD;  Location: [facility];  Service: Orthopedics;  Laterality: Left;  . Colonoscopy    . Arthrodesis foot with weil osteotomy Right 07/26/2014    Procedure: RIGHT HALLUX METATARSOPHALANGEAL JOINT ARTHRODESIS; RIGHT SECOND METATARSAL WEIL OSTEOTOMY; DORSAL CAPSULOTOMY EXTENSOR TENDON LENGTHENING;  Surgeon: Wylene Simmer,  MD;  Location: [facility];  Service: Orthopedics;  Laterality: Right;    reports that she quit smoking about 50 years ago. Her smoking use included Cigarettes. She has a 1.25 pack-year smoking history. She has never used smokeless tobacco. She reports that she drinks alcohol. She reports that she does not use illicit drugs. family history includes Cancer (age of onset: 93) in her brother. Allergies  Allergen Reactions  . Crab [Shellfish Allergy]   . Esomeprazole Magnesium     palpitations  . Penicillins      ROS Review of Systems  Constitutional: Negative for chills, activity change, appetite change, fatigue and unexpected weight change.  HENT: Negative for congestion, mouth sores, sinus pressure and trouble swallowing.   Eyes: Negative for visual disturbance.  Respiratory: Negative for cough and chest tightness.   Gastrointestinal: Positive for diarrhea. Negative for nausea, abdominal pain, constipation and abdominal distention.  Genitourinary: Negative for frequency, difficulty urinating and vaginal pain.  Musculoskeletal: Positive for back pain, arthralgias, neck pain and neck stiffness. Negative for gait problem.  Skin: Negative for pallor and rash.  Neurological: Negative for dizziness, tremors, syncope, weakness, numbness and headaches.  Psychiatric/Behavioral: Negative for suicidal ideas, confusion, sleep disturbance and dysphoric mood. The patient is not nervous/anxious.     Objective:  BP 110/70 mmHg  Pulse 72  Temp(Src) 98.1 F (36.7 C) (Oral)  Ht 5\' 2"  (1.575 m)  Wt 194 lb 12 oz (88.338 kg)  BMI 35.61 kg/m2  SpO2 94%  BP Readings from Last 3 Encounters:  12/20/14 110/70  07/26/14 134/63  02/20/14 112/82    Wt Readings from Last 3 Encounters:  12/20/14 194 lb 12 oz (88.338 kg)  07/26/14 191 lb (86.637 kg)  02/20/14 180 lb (81.647 kg)    Physical Exam  Constitutional: She appears well-developed. No distress.  Obese   HENT:  Head:  Normocephalic.  Right Ear: External ear normal.  Left Ear: External ear normal.  Nose: Nose normal.  Mouth/Throat: Oropharynx is clear and moist.  Eyes: Conjunctivae are normal. Pupils are equal, round, and reactive to light. Right eye exhibits no discharge. Left eye exhibits no discharge.  Neck: Normal range of motion. Neck supple. No JVD present. No tracheal deviation present. No thyromegaly present.  Cardiovascular: Normal rate, regular rhythm and normal heart sounds.   Pulmonary/Chest: No stridor. No respiratory distress. She has no wheezes.  Abdominal: Soft. Bowel sounds are normal. She exhibits no distension and no mass. There is no tenderness. There is no rebound and no guarding.  Musculoskeletal: She exhibits no edema or tenderness.  Lymphadenopathy:    She  has no cervical adenopathy.  Neurological: She displays normal reflexes. No cranial nerve deficit. She exhibits normal muscle tone. Coordination normal.  Skin: No rash noted. No erythema.  Psychiatric: She has a normal mood and affect. Her behavior is normal. Judgment and thought content normal.  B CTS signs are (+) Hands w/OA changes  Lab Results  Component Value Date   WBC 4.2 12/20/2014   HGB 14.3 12/20/2014   HCT 41.7 12/20/2014   PLT 262.0 12/20/2014   GLUCOSE 95 12/20/2014   CHOL 201* 12/20/2014   TRIG 129.0 12/20/2014   HDL 60.00 12/20/2014   LDLCALC 115* 12/20/2014   ALT 23 12/20/2014   AST 25 12/20/2014   NA 142 12/20/2014   K 4.3 12/20/2014   CL 107 12/20/2014   CREATININE 1.00 12/20/2014   BUN 28* 12/20/2014   CO2 29 12/20/2014   TSH 1.29 12/20/2014   INR 1.01 07/24/2013    No results found.  Assessment & Plan:   Diagnoses and all orders for this visit:  Well adult exam Orders: -     Vitamin B12; Future -     Vit D  25 hydroxy (rtn osteoporosis monitoring); Future -     Basic metabolic panel; Future -     CBC with Differential/Platelet; Future -     Hepatic function panel; Future -      Lipid panel; Future -     Sedimentation rate; Future -     TSH; Future -     Urinalysis; Future -     Uric acid; Future  Paresthesia Orders: -     Vitamin B12; Future -     CBC with Differential/Platelet; Future -     Sedimentation rate; Future -     TSH; Future -     Uric acid; Future -     Protein electrophoresis, serum; Future  Vitamin D deficiency Orders: -     Vit D  25 hydroxy (rtn osteoporosis monitoring); Future -     CBC with Differential/Platelet; Future -     Sedimentation rate; Future -     TSH; Future -     Uric acid; Future  Dyslipidemia Orders: -     CBC with Differential/Platelet; Future -     Lipid panel; Future  Abnormal SPEP  Annual physical exam Orders: -     EKG 12-Lead  Other orders -     CELEBREX 200 MG capsule; Take 1 capsule (200 mg total) by mouth daily.  I have discontinued Ms. Leppo's alendronate, dextromethorphan-guaiFENesin, fluticasone, benzonatate, HYDROmorphone, ketorolac, and ondansetron. I have also changed her CELEBREX. Additionally, I am having her maintain her cetirizine, multivitamin, vitamin C, lansoprazole, calcium-vitamin D, glucosamine-chondroitin, magnesium oxide, Biotin, gabapentin, Melatonin, vitamin B-12, aspirin, levothyroxine, traMADol, diazepam, and timolol.  Meds ordered this encounter  Medications  . timolol (TIMOPTIC) 0.5 % ophthalmic solution    Sig:   . DISCONTD: CELEBREX 200 MG capsule    Sig:   . CELEBREX 200 MG capsule    Sig: Take 1 capsule (200 mg total) by mouth daily.    Dispense:  90 capsule    Refill:  3     Follow-up: Return in about 1 year (around 12/20/2015) for Wellness Exam.  Walker Kehr, MD

## 2014-12-20 NOTE — Assessment & Plan Note (Signed)
Options to treat were reviewed. Pt elected Vit D and exercise

## 2014-12-20 NOTE — Assessment & Plan Note (Addendum)
Here for medicare wellness/physical  Diet: heart healthy  Physical activity: not sedentary - walking Depression/mood screen: negative  Hearing: intact to whispered voice  Visual acuity: grossly normal, performs annual eye exam  ADLs: capable  Fall risk: low  Home safety: good  Cognitive evaluation: intact to orientation, naming, recall and repetition  EOL planning: adv directives, full code/ I agree  I have personally reviewed and have noted  1. The patient's medical and social history  2. Their use of alcohol, tobacco or illicit drugs  3. Their current medications and supplements  4. The patient's functional ability including ADL's, fall risks, home safety risks and hearing or visual impairment.  5. Diet and physical activities  6. Evidence for depression or mood disorders 7. Providers roster was discussed    Today patient counseled on age appropriate routine health concerns for screening and prevention, each reviewed and up to date or declined. Immunizations reviewed and up to date or declined. Labs ordered and reviewed. Risk factors for depression reviewed and negative. Hearing function and visual acuity are intact. ADLs screened and addressed as needed. Functional ability and level of safety reviewed and appropriate. Education, counseling and referrals performed based on assessed risks today. Patient provided with a copy of personalized plan for preventive services.  GYN/mammo q 12 mo

## 2014-12-20 NOTE — Assessment & Plan Note (Addendum)
Chronic after meals Lactose intol vs other  Gluten free trial (no wheat products) for 4-6 weeks. OK to use gluten-free bread and gluten-free pasta.  Milk free trial (no milk, ice cream, cheese and yogurt) for 4-6 weeks. OK to use almond, coconut, rice or soy milk. "Almond breeze" brand tastes good.

## 2014-12-20 NOTE — Patient Instructions (Signed)
Gluten free trial (no wheat products) for 4-6 weeks. OK to use gluten-free bread and gluten-free pasta.  Milk free trial (no milk, ice cream, cheese and yogurt) for 4-6 weeks. OK to use almond, coconut, rice or soy milk. "Almond breeze" brand tastes good.     Preventive Care for Adults A healthy lifestyle and preventive care can promote health and wellness. Preventive health guidelines for women include the following key practices.  A routine yearly physical is a good way to check with your health care provider about your health and preventive screening. It is a chance to share any concerns and updates on your health and to receive a thorough exam.  Visit your dentist for a routine exam and preventive care every 6 months. Brush your teeth twice a day and floss once a day. Good oral hygiene prevents tooth decay and gum disease.  The frequency of eye exams is based on your age, health, family medical history, use of contact lenses, and other factors. Follow your health care provider's recommendations for frequency of eye exams.  Eat a healthy diet. Foods like vegetables, fruits, whole grains, low-fat dairy products, and lean protein foods contain the nutrients you need without too many calories. Decrease your intake of foods high in solid fats, added sugars, and salt. Eat the right amount of calories for you.Get information about a proper diet from your health care provider, if necessary.  Regular physical exercise is one of the most important things you can do for your health. Most adults should get at least 150 minutes of moderate-intensity exercise (any activity that increases your heart rate and causes you to sweat) each week. In addition, most adults need muscle-strengthening exercises on 2 or more days a week.  Maintain a healthy weight. The body mass index (BMI) is a screening tool to identify possible weight problems. It provides an estimate of body fat based on height and weight. Your  health care provider can find your BMI and can help you achieve or maintain a healthy weight.For adults 20 years and older:  A BMI below 18.5 is considered underweight.  A BMI of 18.5 to 24.9 is normal.  A BMI of 25 to 29.9 is considered overweight.  A BMI of 30 and above is considered obese.  Maintain normal blood lipids and cholesterol levels by exercising and minimizing your intake of saturated fat. Eat a balanced diet with plenty of fruit and vegetables. Blood tests for lipids and cholesterol should begin at age 20 and be repeated every 5 years. If your lipid or cholesterol levels are high, you are over 50, or you are at high risk for heart disease, you may need your cholesterol levels checked more frequently.Ongoing high lipid and cholesterol levels should be treated with medicines if diet and exercise are not working.  If you smoke, find out from your health care provider how to quit. If you do not use tobacco, do not start.  Lung cancer screening is recommended for adults aged 55-80 years who are at high risk for developing lung cancer because of a history of smoking. A yearly low-dose CT scan of the lungs is recommended for people who have at least a 30-pack-year history of smoking and are a current smoker or have quit within the past 15 years. A pack year of smoking is smoking an average of 1 pack of cigarettes a day for 1 year (for example: 1 pack a day for 30 years or 2 packs a day for 15 years).   Yearly screening should continue until the smoker has stopped smoking for at least 15 years. Yearly screening should be stopped for people who develop a health problem that would prevent them from having lung cancer treatment.  If you are pregnant, do not drink alcohol. If you are breastfeeding, be very cautious about drinking alcohol. If you are not pregnant and choose to drink alcohol, do not have more than 1 drink per day. One drink is considered to be 12 ounces (355 mL) of beer, 5 ounces  (148 mL) of wine, or 1.5 ounces (44 mL) of liquor.  Avoid use of street drugs. Do not share needles with anyone. Ask for help if you need support or instructions about stopping the use of drugs.  High blood pressure causes heart disease and increases the risk of stroke. Your blood pressure should be checked at least every 1 to 2 years. Ongoing high blood pressure should be treated with medicines if weight loss and exercise do not work.  If you are 55-79 years old, ask your health care provider if you should take aspirin to prevent strokes.  Diabetes screening involves taking a blood sample to check your fasting blood sugar level. This should be done once every 3 years, after age 45, if you are within normal weight and without risk factors for diabetes. Testing should be considered at a younger age or be carried out more frequently if you are overweight and have at least 1 risk factor for diabetes.  Breast cancer screening is essential preventive care for women. You should practice "breast self-awareness." This means understanding the normal appearance and feel of your breasts and may include breast self-examination. Any changes detected, no matter how small, should be reported to a health care provider. Women in their 20s and 30s should have a clinical breast exam (CBE) by a health care provider as part of a regular health exam every 1 to 3 years. After age 40, women should have a CBE every year. Starting at age 40, women should consider having a mammogram (breast X-ray test) every year. Women who have a family history of breast cancer should talk to their health care provider about genetic screening. Women at a high risk of breast cancer should talk to their health care providers about having an MRI and a mammogram every year.  Breast cancer gene (BRCA)-related cancer risk assessment is recommended for women who have family members with BRCA-related cancers. BRCA-related cancers include breast, ovarian,  tubal, and peritoneal cancers. Having family members with these cancers may be associated with an increased risk for harmful changes (mutations) in the breast cancer genes BRCA1 and BRCA2. Results of the assessment will determine the need for genetic counseling and BRCA1 and BRCA2 testing.  Routine pelvic exams to screen for cancer are no longer recommended for nonpregnant women who are considered low risk for cancer of the pelvic organs (ovaries, uterus, and vagina) and who do not have symptoms. Ask your health care provider if a screening pelvic exam is right for you.  If you have had past treatment for cervical cancer or a condition that could lead to cancer, you need Pap tests and screening for cancer for at least 20 years after your treatment. If Pap tests have been discontinued, your risk factors (such as having a new sexual partner) need to be reassessed to determine if screening should be resumed. Some women have medical problems that increase the chance of getting cervical cancer. In these cases, your health care provider   may recommend more frequent screening and Pap tests.  The HPV test is an additional test that may be used for cervical cancer screening. The HPV test looks for the virus that can cause the cell changes on the cervix. The cells collected during the Pap test can be tested for HPV. The HPV test could be used to screen women aged 30 years and older, and should be used in women of any age who have unclear Pap test results. After the age of 30, women should have HPV testing at the same frequency as a Pap test.  Colorectal cancer can be detected and often prevented. Most routine colorectal cancer screening begins at the age of 50 years and continues through age 75 years. However, your health care provider may recommend screening at an earlier age if you have risk factors for colon cancer. On a yearly basis, your health care provider may provide home test kits to check for hidden blood in  the stool. Use of a small camera at the end of a tube, to directly examine the colon (sigmoidoscopy or colonoscopy), can detect the earliest forms of colorectal cancer. Talk to your health care provider about this at age 50, when routine screening begins. Direct exam of the colon should be repeated every 5-10 years through age 75 years, unless early forms of pre-cancerous polyps or small growths are found.  People who are at an increased risk for hepatitis B should be screened for this virus. You are considered at high risk for hepatitis B if:  You were born in a country where hepatitis B occurs often. Talk with your health care provider about which countries are considered high risk.  Your parents were born in a high-risk country and you have not received a shot to protect against hepatitis B (hepatitis B vaccine).  You have HIV or AIDS.  You use needles to inject street drugs.  You live with, or have sex with, someone who has hepatitis B.  You get hemodialysis treatment.  You take certain medicines for conditions like cancer, organ transplantation, and autoimmune conditions.  Hepatitis C blood testing is recommended for all people born from 1945 through 1965 and any individual with known risks for hepatitis C.  Practice safe sex. Use condoms and avoid high-risk sexual practices to reduce the spread of sexually transmitted infections (STIs). STIs include gonorrhea, chlamydia, syphilis, trichomonas, herpes, HPV, and human immunodeficiency virus (HIV). Herpes, HIV, and HPV are viral illnesses that have no cure. They can result in disability, cancer, and death.  You should be screened for sexually transmitted illnesses (STIs) including gonorrhea and chlamydia if:  You are sexually active and are younger than 24 years.  You are older than 24 years and your health care provider tells you that you are at risk for this type of infection.  Your sexual activity has changed since you were last  screened and you are at an increased risk for chlamydia or gonorrhea. Ask your health care provider if you are at risk.  If you are at risk of being infected with HIV, it is recommended that you take a prescription medicine daily to prevent HIV infection. This is called preexposure prophylaxis (PrEP). You are considered at risk if:  You are a heterosexual woman, are sexually active, and are at increased risk for HIV infection.  You take drugs by injection.  You are sexually active with a partner who has HIV.  Talk with your health care provider about whether you are at   high risk of being infected with HIV. If you choose to begin PrEP, you should first be tested for HIV. You should then be tested every 3 months for as long as you are taking PrEP.  Osteoporosis is a disease in which the bones lose minerals and strength with aging. This can result in serious bone fractures or breaks. The risk of osteoporosis can be identified using a bone density scan. Women ages 88 years and over and women at risk for fractures or osteoporosis should discuss screening with their health care providers. Ask your health care provider whether you should take a calcium supplement or vitamin D to reduce the rate of osteoporosis.  Menopause can be associated with physical symptoms and risks. Hormone replacement therapy is available to decrease symptoms and risks. You should talk to your health care provider about whether hormone replacement therapy is right for you.  Use sunscreen. Apply sunscreen liberally and repeatedly throughout the day. You should seek shade when your shadow is shorter than you. Protect yourself by wearing long sleeves, pants, a wide-brimmed hat, and sunglasses year round, whenever you are outdoors.  Once a month, do a whole body skin exam, using a mirror to look at the skin on your back. Tell your health care provider of new moles, moles that have irregular borders, moles that are larger than a pencil  eraser, or moles that have changed in shape or color.  Stay current with required vaccines (immunizations).  Influenza vaccine. All adults should be immunized every year.  Tetanus, diphtheria, and acellular pertussis (Td, Tdap) vaccine. Pregnant women should receive 1 dose of Tdap vaccine during each pregnancy. The dose should be obtained regardless of the length of time since the last dose. Immunization is preferred during the 27th-36th week of gestation. An adult who has not previously received Tdap or who does not know her vaccine status should receive 1 dose of Tdap. This initial dose should be followed by tetanus and diphtheria toxoids (Td) booster doses every 10 years. Adults with an unknown or incomplete history of completing a 3-dose immunization series with Td-containing vaccines should begin or complete a primary immunization series including a Tdap dose. Adults should receive a Td booster every 10 years.  Varicella vaccine. An adult without evidence of immunity to varicella should receive 2 doses or a second dose if she has previously received 1 dose. Pregnant females who do not have evidence of immunity should receive the first dose after pregnancy. This first dose should be obtained before leaving the health care facility. The second dose should be obtained 4-8 weeks after the first dose.  Human papillomavirus (HPV) vaccine. Females aged 13-26 years who have not received the vaccine previously should obtain the 3-dose series. The vaccine is not recommended for use in pregnant females. However, pregnancy testing is not needed before receiving a dose. If a female is found to be pregnant after receiving a dose, no treatment is needed. In that case, the remaining doses should be delayed until after the pregnancy. Immunization is recommended for any person with an immunocompromised condition through the age of 77 years if she did not get any or all doses earlier. During the 3-dose series, the  second dose should be obtained 4-8 weeks after the first dose. The third dose should be obtained 24 weeks after the first dose and 16 weeks after the second dose.  Zoster vaccine. One dose is recommended for adults aged 50 years or older unless certain conditions are present.  Measles, mumps, and rubella (MMR) vaccine. Adults born before 1957 generally are considered immune to measles and mumps. Adults born in 1957 or later should have 1 or more doses of MMR vaccine unless there is a contraindication to the vaccine or there is laboratory evidence of immunity to each of the three diseases. A routine second dose of MMR vaccine should be obtained at least 28 days after the first dose for students attending postsecondary schools, health care workers, or international travelers. People who received inactivated measles vaccine or an unknown type of measles vaccine during 1963-1967 should receive 2 doses of MMR vaccine. People who received inactivated mumps vaccine or an unknown type of mumps vaccine before 1979 and are at high risk for mumps infection should consider immunization with 2 doses of MMR vaccine. For females of childbearing age, rubella immunity should be determined. If there is no evidence of immunity, females who are not pregnant should be vaccinated. If there is no evidence of immunity, females who are pregnant should delay immunization until after pregnancy. Unvaccinated health care workers born before 1957 who lack laboratory evidence of measles, mumps, or rubella immunity or laboratory confirmation of disease should consider measles and mumps immunization with 2 doses of MMR vaccine or rubella immunization with 1 dose of MMR vaccine.  Pneumococcal 13-valent conjugate (PCV13) vaccine. When indicated, a person who is uncertain of her immunization history and has no record of immunization should receive the PCV13 vaccine. An adult aged 19 years or older who has certain medical conditions and has not  been previously immunized should receive 1 dose of PCV13 vaccine. This PCV13 should be followed with a dose of pneumococcal polysaccharide (PPSV23) vaccine. The PPSV23 vaccine dose should be obtained at least 8 weeks after the dose of PCV13 vaccine. An adult aged 19 years or older who has certain medical conditions and previously received 1 or more doses of PPSV23 vaccine should receive 1 dose of PCV13. The PCV13 vaccine dose should be obtained 1 or more years after the last PPSV23 vaccine dose.  Pneumococcal polysaccharide (PPSV23) vaccine. When PCV13 is also indicated, PCV13 should be obtained first. All adults aged 65 years and older should be immunized. An adult younger than age 65 years who has certain medical conditions should be immunized. Any person who resides in a nursing home or long-term care facility should be immunized. An adult smoker should be immunized. People with an immunocompromised condition and certain other conditions should receive both PCV13 and PPSV23 vaccines. People with human immunodeficiency virus (HIV) infection should be immunized as soon as possible after diagnosis. Immunization during chemotherapy or radiation therapy should be avoided. Routine use of PPSV23 vaccine is not recommended for American Indians, [place] Natives, or people younger than 65 years unless there are medical conditions that require PPSV23 vaccine. When indicated, people who have unknown immunization and have no record of immunization should receive PPSV23 vaccine. One-time revaccination 5 years after the first dose of PPSV23 is recommended for people aged 19-64 years who have chronic kidney failure, nephrotic syndrome, asplenia, or immunocompromised conditions. People who received 1-2 doses of PPSV23 before age 65 years should receive another dose of PPSV23 vaccine at age 65 years or later if at least 5 years have passed since the previous dose. Doses of PPSV23 are not needed for people immunized with PPSV23 at  or after age 65 years.  Meningococcal vaccine. Adults with asplenia or persistent complement component deficiencies should receive 2 doses of quadrivalent meningococcal conjugate (MenACWY-D) vaccine.   The doses should be obtained at least 2 months apart. Microbiologists working with certain meningococcal bacteria, [place] recruits, people at risk during an outbreak, and people who travel to or live in countries with a high rate of meningitis should be immunized. A first-year college student up through age 29 years who is living in a residence hall should receive a dose if she did not receive a dose on or after her 16th birthday. Adults who have certain high-risk conditions should receive one or more doses of vaccine.  Hepatitis A vaccine. Adults who wish to be protected from this disease, have certain high-risk conditions, work with hepatitis A-infected animals, work in hepatitis A research labs, or travel to or work in countries with a high rate of hepatitis A should be immunized. Adults who were previously unvaccinated and who anticipate close contact with an international adoptee during the first 60 days after arrival in the [place] from a country with a high rate of hepatitis A should be immunized.  Hepatitis B vaccine. Adults who wish to be protected from this disease, have certain high-risk conditions, may be exposed to blood or other infectious body fluids, are household contacts or sex partners of hepatitis B positive people, are clients or workers in certain care facilities, or travel to or work in countries with a high rate of hepatitis B should be immunized.  Haemophilus influenzae type b (Hib) vaccine. A previously unvaccinated person with asplenia or sickle cell disease or having a scheduled splenectomy should receive 1 dose of Hib vaccine. Regardless of previous immunization, a recipient of a hematopoietic stem cell transplant should receive a 3-dose series 6-12 months after her  successful transplant. Hib vaccine is not recommended for adults with HIV infection. Preventive Services / Frequency Ages 52 to 38 years  Blood pressure check.** / Every 1 to 2 years.  Lipid and cholesterol check.** / Every 5 years beginning at age 16.  Clinical breast exam.** / Every 3 years for women in their 41s and 77s.  BRCA-related cancer risk assessment.** / For women who have family members with a BRCA-related cancer (breast, ovarian, tubal, or peritoneal cancers).  Pap test.** / Every 2 years from ages 17 through 74. Every 3 years starting at age 11 through age 69 or 61 with a history of 3 consecutive normal Pap tests.  HPV screening.** / Every 3 years from ages 12 through ages 3 to 1 with a history of 3 consecutive normal Pap tests.  Hepatitis C blood test.** / For any individual with known risks for hepatitis C.  Skin self-exam. / Monthly.  Influenza vaccine. / Every year.  Tetanus, diphtheria, and acellular pertussis (Tdap, Td) vaccine.** / Consult your health care provider. Pregnant women should receive 1 dose of Tdap vaccine during each pregnancy. 1 dose of Td every 10 years.  Varicella vaccine.** / Consult your health care provider. Pregnant females who do not have evidence of immunity should receive the first dose after pregnancy.  HPV vaccine. / 3 doses over 6 months, if 30 and younger. The vaccine is not recommended for use in pregnant females. However, pregnancy testing is not needed before receiving a dose.  Measles, mumps, rubella (MMR) vaccine.** / You need at least 1 dose of MMR if you were born in 1957 or later. You may also need a 2nd dose. For females of childbearing age, rubella immunity should be determined. If there is no evidence of immunity, females who are not pregnant should be vaccinated. If there is  no evidence of immunity, females who are pregnant should delay immunization until after pregnancy.  Pneumococcal 13-valent conjugate (PCV13) vaccine.** /  Consult your health care provider.  Pneumococcal polysaccharide (PPSV23) vaccine.** / 1 to 2 doses if you smoke cigarettes or if you have certain conditions.  Meningococcal vaccine.** / 1 dose if you are age 19 to 21 years and a first-year college student living in a residence hall, or have one of several medical conditions, you need to get vaccinated against meningococcal disease. You may also need additional booster doses.  Hepatitis A vaccine.** / Consult your health care provider.  Hepatitis B vaccine.** / Consult your health care provider.  Haemophilus influenzae type b (Hib) vaccine.** / Consult your health care provider. Ages 40 to 64 years  Blood pressure check.** / Every 1 to 2 years.  Lipid and cholesterol check.** / Every 5 years beginning at age 20 years.  Lung cancer screening. / Every year if you are aged 55-80 years and have a 30-pack-year history of smoking and currently smoke or have quit within the past 15 years. Yearly screening is stopped once you have quit smoking for at least 15 years or develop a health problem that would prevent you from having lung cancer treatment.  Clinical breast exam.** / Every year after age 40 years.  BRCA-related cancer risk assessment.** / For women who have family members with a BRCA-related cancer (breast, ovarian, tubal, or peritoneal cancers).  Mammogram.** / Every year beginning at age 40 years and continuing for as long as you are in good health. Consult with your health care provider.  Pap test.** / Every 3 years starting at age 30 years through age 65 or 70 years with a history of 3 consecutive normal Pap tests.  HPV screening.** / Every 3 years from ages 30 years through ages 65 to 70 years with a history of 3 consecutive normal Pap tests.  Fecal occult blood test (FOBT) of stool. / Every year beginning at age 50 years and continuing until age 75 years. You may not need to do this test if you get a colonoscopy every 10  years.  Flexible sigmoidoscopy or colonoscopy.** / Every 5 years for a flexible sigmoidoscopy or every 10 years for a colonoscopy beginning at age 50 years and continuing until age 75 years.  Hepatitis C blood test.** / For all people born from 1945 through 1965 and any individual with known risks for hepatitis C.  Skin self-exam. / Monthly.  Influenza vaccine. / Every year.  Tetanus, diphtheria, and acellular pertussis (Tdap/Td) vaccine.** / Consult your health care provider. Pregnant women should receive 1 dose of Tdap vaccine during each pregnancy. 1 dose of Td every 10 years.  Varicella vaccine.** / Consult your health care provider. Pregnant females who do not have evidence of immunity should receive the first dose after pregnancy.  Zoster vaccine.** / 1 dose for adults aged 60 years or older.  Measles, mumps, rubella (MMR) vaccine.** / You need at least 1 dose of MMR if you were born in 1957 or later. You may also need a 2nd dose. For females of childbearing age, rubella immunity should be determined. If there is no evidence of immunity, females who are not pregnant should be vaccinated. If there is no evidence of immunity, females who are pregnant should delay immunization until after pregnancy.  Pneumococcal 13-valent conjugate (PCV13) vaccine.** / Consult your health care provider.  Pneumococcal polysaccharide (PPSV23) vaccine.** / 1 to 2 doses if you smoke cigarettes   or if you have certain conditions.  Meningococcal vaccine.** / Consult your health care provider.  Hepatitis A vaccine.** / Consult your health care provider.  Hepatitis B vaccine.** / Consult your health care provider.  Haemophilus influenzae type b (Hib) vaccine.** / Consult your health care provider. Ages 106 years and over  Blood pressure check.** / Every 1 to 2 years.  Lipid and cholesterol check.** / Every 5 years beginning at age 43 years.  Lung cancer screening. / Every year if you are aged 47-80 years  and have a 30-pack-year history of smoking and currently smoke or have quit within the past 15 years. Yearly screening is stopped once you have quit smoking for at least 15 years or develop a health problem that would prevent you from having lung cancer treatment.  Clinical breast exam.** / Every year after age 30 years.  BRCA-related cancer risk assessment.** / For women who have family members with a BRCA-related cancer (breast, ovarian, tubal, or peritoneal cancers).  Mammogram.** / Every year beginning at age 77 years and continuing for as long as you are in good health. Consult with your health care provider.  Pap test.** / Every 3 years starting at age 45 years through age 91 or 23 years with 3 consecutive normal Pap tests. Testing can be stopped between 65 and 70 years with 3 consecutive normal Pap tests and no abnormal Pap or HPV tests in the past 10 years.  HPV screening.** / Every 3 years from ages 33 years through ages 46 or 71 years with a history of 3 consecutive normal Pap tests. Testing can be stopped between 65 and 70 years with 3 consecutive normal Pap tests and no abnormal Pap or HPV tests in the past 10 years.  Fecal occult blood test (FOBT) of stool. / Every year beginning at age 49 years and continuing until age 21 years. You may not need to do this test if you get a colonoscopy every 10 years.  Flexible sigmoidoscopy or colonoscopy.** / Every 5 years for a flexible sigmoidoscopy or every 10 years for a colonoscopy beginning at age 50 years and continuing until age 53 years.  Hepatitis C blood test.** / For all people born from [birth year] through 1967 and any individual with known risks for hepatitis C.  Osteoporosis screening.** / A one-time screening for women ages 30 years and over and women at risk for fractures or osteoporosis.  Skin self-exam. / Monthly.  Influenza vaccine. / Every year.  Tetanus, diphtheria, and acellular pertussis (Tdap/Td) vaccine.** / 1 dose of Td  every 10 years.  Varicella vaccine.** / Consult your health care provider.  Zoster vaccine.** / 1 dose for adults aged 58 years or older.  Pneumococcal 13-valent conjugate (PCV13) vaccine.** / Consult your health care provider.  Pneumococcal polysaccharide (PPSV23) vaccine.** / 1 dose for all adults aged 62 years and older.  Meningococcal vaccine.** / Consult your health care provider.  Hepatitis A vaccine.** / Consult your health care provider.  Hepatitis B vaccine.** / Consult your health care provider.  Haemophilus influenzae type b (Hib) vaccine.** / Consult your health care provider. ** Family history and personal history of risk and conditions may change your health care provider's recommendations. Document Released: 07/14/2001 Document Revised: 10/02/2013 Document Reviewed: 10/13/2010 [facility] Patient Information 2015 [place], [place]. This information is not intended to replace advice given to you by your health care provider. Make sure you discuss any questions you have with your health care provider.

## 2014-12-20 NOTE — Progress Notes (Signed)
Pre visit review using our clinic review tool, if applicable. No additional management support is needed unless otherwise documented below in the visit note. 

## 2014-12-24 ENCOUNTER — Encounter: Payer: Medicare Other | Admitting: Internal Medicine

## 2014-12-24 LAB — PROTEIN ELECTROPHORESIS, SERUM
ALPHA-2-GLOBULIN: 0.6 g/dL (ref 0.5–0.9)
Albumin ELP: 3.9 g/dL (ref 3.8–4.8)
Alpha-1-Globulin: 0.3 g/dL (ref 0.2–0.3)
Beta 2: 0.3 g/dL (ref 0.2–0.5)
Beta Globulin: 0.4 g/dL (ref 0.4–0.6)
Gamma Globulin: 0.8 g/dL (ref 0.8–1.7)
Total Protein, Serum Electrophoresis: 6.3 g/dL (ref 6.1–8.1)

## 2015-01-02 DIAGNOSIS — M542 Cervicalgia: Secondary | ICD-10-CM | POA: Diagnosis not present

## 2015-02-14 DIAGNOSIS — Z23 Encounter for immunization: Secondary | ICD-10-CM | POA: Diagnosis not present

## 2015-02-20 DIAGNOSIS — C679 Malignant neoplasm of bladder, unspecified: Secondary | ICD-10-CM | POA: Diagnosis not present

## 2015-02-21 ENCOUNTER — Other Ambulatory Visit: Payer: Self-pay | Admitting: Internal Medicine

## 2015-04-10 DIAGNOSIS — H40012 Open angle with borderline findings, low risk, left eye: Secondary | ICD-10-CM | POA: Diagnosis not present

## 2015-04-10 DIAGNOSIS — H40052 Ocular hypertension, left eye: Secondary | ICD-10-CM | POA: Diagnosis not present

## 2015-04-10 DIAGNOSIS — H40051 Ocular hypertension, right eye: Secondary | ICD-10-CM | POA: Diagnosis not present

## 2015-04-10 DIAGNOSIS — H40011 Open angle with borderline findings, low risk, right eye: Secondary | ICD-10-CM | POA: Diagnosis not present

## 2015-04-18 DIAGNOSIS — C4401 Basal cell carcinoma of skin of lip: Secondary | ICD-10-CM | POA: Diagnosis not present

## 2015-04-18 DIAGNOSIS — L820 Inflamed seborrheic keratosis: Secondary | ICD-10-CM | POA: Diagnosis not present

## 2015-04-18 DIAGNOSIS — D485 Neoplasm of uncertain behavior of skin: Secondary | ICD-10-CM | POA: Diagnosis not present

## 2015-05-21 ENCOUNTER — Other Ambulatory Visit: Payer: Self-pay | Admitting: Internal Medicine

## 2015-05-23 NOTE — Telephone Encounter (Signed)
Done

## 2015-06-05 DIAGNOSIS — C4401 Basal cell carcinoma of skin of lip: Secondary | ICD-10-CM | POA: Diagnosis not present

## 2015-06-13 DIAGNOSIS — N6011 Diffuse cystic mastopathy of right breast: Secondary | ICD-10-CM | POA: Diagnosis not present

## 2015-06-13 DIAGNOSIS — N6012 Diffuse cystic mastopathy of left breast: Secondary | ICD-10-CM | POA: Diagnosis not present

## 2015-06-13 LAB — HM MAMMOGRAPHY

## 2015-06-27 ENCOUNTER — Encounter: Payer: Self-pay | Admitting: Internal Medicine

## 2015-08-19 DIAGNOSIS — Z1151 Encounter for screening for human papillomavirus (HPV): Secondary | ICD-10-CM | POA: Diagnosis not present

## 2015-08-19 DIAGNOSIS — Z124 Encounter for screening for malignant neoplasm of cervix: Secondary | ICD-10-CM | POA: Diagnosis not present

## 2015-08-19 DIAGNOSIS — Z01419 Encounter for gynecological examination (general) (routine) without abnormal findings: Secondary | ICD-10-CM | POA: Diagnosis not present

## 2015-08-28 DIAGNOSIS — Z8551 Personal history of malignant neoplasm of bladder: Secondary | ICD-10-CM | POA: Diagnosis not present

## 2015-08-28 DIAGNOSIS — Z0000 Encounter for general adult medical examination without abnormal findings: Secondary | ICD-10-CM | POA: Diagnosis not present

## 2015-09-10 ENCOUNTER — Encounter (HOSPITAL_COMMUNITY): Payer: Self-pay | Admitting: Emergency Medicine

## 2015-09-10 ENCOUNTER — Telehealth: Payer: Self-pay | Admitting: Internal Medicine

## 2015-09-10 ENCOUNTER — Emergency Department (HOSPITAL_COMMUNITY): Payer: Medicare Other

## 2015-09-10 ENCOUNTER — Emergency Department (HOSPITAL_COMMUNITY)
Admission: EM | Admit: 2015-09-10 | Discharge: 2015-09-10 | Disposition: A | Discharge status: Home / Self Care | Payer: Medicare Other | Attending: Emergency Medicine | Admitting: Emergency Medicine

## 2015-09-10 DIAGNOSIS — Z7982 Long term (current) use of aspirin: Secondary | ICD-10-CM | POA: Diagnosis not present

## 2015-09-10 DIAGNOSIS — I493 Ventricular premature depolarization: Secondary | ICD-10-CM | POA: Insufficient documentation

## 2015-09-10 DIAGNOSIS — Z8551 Personal history of malignant neoplasm of bladder: Secondary | ICD-10-CM | POA: Diagnosis not present

## 2015-09-10 DIAGNOSIS — Z79899 Other long term (current) drug therapy: Secondary | ICD-10-CM | POA: Diagnosis not present

## 2015-09-10 DIAGNOSIS — G43909 Migraine, unspecified, not intractable, without status migrainosus: Secondary | ICD-10-CM | POA: Insufficient documentation

## 2015-09-10 DIAGNOSIS — M1990 Unspecified osteoarthritis, unspecified site: Secondary | ICD-10-CM | POA: Diagnosis not present

## 2015-09-10 DIAGNOSIS — Z880 Allergy status to penicillin: Secondary | ICD-10-CM | POA: Insufficient documentation

## 2015-09-10 DIAGNOSIS — K219 Gastro-esophageal reflux disease without esophagitis: Secondary | ICD-10-CM | POA: Diagnosis not present

## 2015-09-10 DIAGNOSIS — Z8639 Personal history of other endocrine, nutritional and metabolic disease: Secondary | ICD-10-CM | POA: Diagnosis not present

## 2015-09-10 DIAGNOSIS — Z87891 Personal history of nicotine dependence: Secondary | ICD-10-CM | POA: Insufficient documentation

## 2015-09-10 DIAGNOSIS — R079 Chest pain, unspecified: Secondary | ICD-10-CM | POA: Diagnosis present

## 2015-09-10 LAB — BASIC METABOLIC PANEL
ANION GAP: 13 (ref 5–15)
BUN: 18 mg/dL (ref 6–20)
CO2: 22 mmol/L (ref 22–32)
Calcium: 9.5 mg/dL (ref 8.9–10.3)
Chloride: 108 mmol/L (ref 101–111)
Creatinine, Ser: 1.03 mg/dL — ABNORMAL HIGH (ref 0.44–1.00)
GFR calc Af Amer: 60 mL/min — ABNORMAL LOW (ref >60)
GFR calc non Af Amer: 51 mL/min — ABNORMAL LOW (ref >60)
GLUCOSE: 120 mg/dL — AB (ref 65–99)
POTASSIUM: 4.2 mmol/L (ref 3.5–5.1)
Sodium: 143 mmol/L (ref 135–145)

## 2015-09-10 LAB — TSH: TSH: 1.817 u[IU]/mL (ref 0.350–4.500)

## 2015-09-10 LAB — PHOSPHORUS: Phosphorus: 3.4 mg/dL (ref 2.5–4.6)

## 2015-09-10 LAB — CBC
HEMATOCRIT: 42.9 % (ref 36.0–46.0)
HEMOGLOBIN: 14.1 g/dL (ref 12.0–15.0)
MCH: 31.5 pg (ref 26.0–34.0)
MCHC: 32.9 g/dL (ref 30.0–36.0)
MCV: 96 fL (ref 78.0–100.0)
Platelets: 263 10*3/uL (ref 150–400)
RBC: 4.47 MIL/uL (ref 3.87–5.11)
RDW: 13.1 % (ref 11.5–15.5)
WBC: 7.7 10*3/uL (ref 4.0–10.5)

## 2015-09-10 LAB — MAGNESIUM: Magnesium: 2.2 mg/dL (ref 1.7–2.4)

## 2015-09-10 LAB — TROPONIN I

## 2015-09-10 LAB — I-STAT TROPONIN, ED: Troponin i, poc: 0 ng/mL (ref 0.00–0.08)

## 2015-09-10 MED ORDER — SODIUM CHLORIDE 0.9 % IV BOLUS (SEPSIS)
1000.0000 mL | Freq: Once | INTRAVENOUS | Status: AC
  Administered 2015-09-10: 1000 mL via INTRAVENOUS

## 2015-09-10 NOTE — Telephone Encounter (Signed)
Patient Name: LUISAFERNANDA HELINSKI DOB: 04/11/1941 Initial Comment Caller States has fluttering in chest for over a week. There is some tightness feeling. Nurse Assessment Nurse: Vallery Sa, RN, Cathy Date/Time (Eastern Time): 09/10/2015 12:24:56 PM Confirm and document reason for call. If symptomatic, describe symptoms. You must click the next button to save text entered. ---Caller states she developed tightness in her chest today and has had fluttering in her chest the past week. No severe breathing difficulty. No injury in the past week. Alert and responsive. Has the patient traveled out of the country within the last 30 days? ---No Does the patient have any new or worsening symptoms? ---Yes Will a triage be completed? ---Yes Related visit to physician within the last 2 weeks? ---No Does the PT have any chronic conditions? (i.e. diabetes, asthma, etc.) ---Yes List chronic conditions. ---Reflux Is this a behavioral health or substance abuse call? ---No Guidelines Guideline Title Affirmed Question Affirmed Notes Chest Pain Difficulty breathing Final Disposition User Go to ED Now Vallery Sa, RN, Cathy Referrals [facility] - ED or [facility] - ED Disagree/Comply: Comply

## 2015-09-10 NOTE — ED Provider Notes (Signed)
CSN: [id number]     Arrival date & time 09/10/15  1323 History   First MD Initiated Contact with Patient 09/10/15 1843     Chief Complaint  Patient presents with  . Chest Pain     (Consider location/radiation/quality/duration/timing/severity/associated sxs/prior Treatment) Patient is a 75 y.o. female presenting with palpitations.  Palpitations Palpitations quality:  Irregular Onset quality:  Gradual Duration:  1 week Timing:  Intermittent Progression:  Waxing and waning Chronicity:  New Context: not anxiety, not caffeine and not exercise   Relieved by:  Nothing Worsened by:  Nothing Ineffective treatments:  None tried Associated symptoms: no chest pain, no chest pressure, no cough, no lower extremity edema, no nausea, no shortness of breath and no weakness   Risk factors: no heart disease, no hx of atrial fibrillation and no hx of PE     Past Medical History  Diagnosis Date  . Goiter     synthroid used to shrink  . GERD (gastroesophageal reflux disease)   . Arthritis   . PONV (postoperative nausea and vomiting)   . H/O hiatal hernia   . Headache(784.0)     migraines in morning  . Cancer ([place]) 13 years ago    bladder  . Seasonal allergies    Past Surgical History  Procedure Laterality Date  . Rotator cuff repair Bilateral ~2010, 2012  . Bunionectomy Bilateral   . Bladder surgery  13 years ago  . Total knee arthroplasty Right 07/19/2012    Procedure: TOTAL KNEE ARTHROPLASTY;  Surgeon: Mauri Pole, MD;  Location: [facility];  Service: Orthopedics;  Laterality: Right;  . Tonsillectomy  as child  . Lumbar fusion  May 2014    L4-L5  . Total knee arthroplasty Left 08/01/2013    Procedure: LEFT TOTAL KNEE ARTHROPLASTY;  Surgeon: Mauri Pole, MD;  Location: [facility];  Service: Orthopedics;  Laterality: Left;  . Colonoscopy    . Arthrodesis foot with weil osteotomy Right 07/26/2014    Procedure: RIGHT HALLUX METATARSOPHALANGEAL JOINT ARTHRODESIS; RIGHT SECOND METATARSAL WEIL  OSTEOTOMY; DORSAL CAPSULOTOMY EXTENSOR TENDON LENGTHENING;  Surgeon: Wylene Simmer, MD;  Location: [place];  Service: Orthopedics;  Laterality: Right;   Family History  Problem Relation Age of Onset  . Cancer Brother 37    pancreatic   Social History  Substance Use Topics  . Smoking status: Former Smoker -- 0.25 packs/day for 5 years    Types: Cigarettes    Quit date: 06/01/1964  . Smokeless tobacco: Never Used  . Alcohol Use: Yes     Comment: occasional   OB History    No data available     Review of Systems  Constitutional: Negative for fever and chills.  HENT: Negative for congestion and rhinorrhea.   Eyes: Negative for visual disturbance.  Respiratory: Negative for cough and shortness of breath.   Cardiovascular: Positive for palpitations. Negative for chest pain.  Gastrointestinal: Negative for nausea and abdominal pain.  Genitourinary: Negative for flank pain.  Musculoskeletal: Negative for neck pain.  Skin: Negative for rash.  Neurological: Negative for syncope, weakness and headaches.      Allergies  Crab; Esomeprazole magnesium; and Penicillins  Home Medications   Prior to Admission medications   Medication Sig Start Date End Date Taking? Authorizing Provider  aspirin EC 325 MG EC tablet Take 1 tablet (325 mg total) by mouth 2 (two) times daily. 08/02/13   Danae Orleans, PA-C  Biotin 5000 MCG TABS Take 1 tablet by mouth daily.  Historical Provider, MD  calcium-vitamin D (OSCAL WITH D) 500-200 MG-UNIT per tablet Take 1 tablet by mouth daily.    Historical Provider, MD  CELEBREX 200 MG capsule Take 1 capsule (200 mg total) by mouth daily. 12/20/14   Aleksei Plotnikov V, MD  cetirizine (ZYRTEC) 10 MG tablet Take 10 mg by mouth daily.    Historical Provider, MD  diazepam (VALIUM) 5 MG tablet TAKE HALF TO ONE TAB BY MOUTHEVERY 12 HOURS AS NEEDED FOR ANXIETY 05/23/15   Aleksei Plotnikov V, MD  gabapentin (NEURONTIN) 300 MG capsule Take 300 mg by  mouth at bedtime as needed. Back pain    Historical Provider, MD  glucosamine-chondroitin 500-400 MG tablet Take 1 tablet by mouth daily.    Historical Provider, MD  lansoprazole (PREVACID) 15 MG capsule Take 15 mg by mouth daily.    Historical Provider, MD  levothyroxine (SYNTHROID, LEVOTHROID) 25 MCG tablet TAKE ONE TABLET BY MOUTH ONE TIME DAILY BEFORE BREAKFAST 02/22/15   Aleksei Plotnikov V, MD  magnesium oxide (MAG-OX) 400 MG tablet Take 400 mg by mouth daily.    Historical Provider, MD  Melatonin 3 MG TABS Take 1 tablet by mouth at bedtime as needed. sleep    Historical Provider, MD  Multiple Vitamin (MULTIVITAMIN) tablet Take 1 tablet by mouth daily.    Historical Provider, MD  timolol (TIMOPTIC) 0.5 % ophthalmic solution  11/19/14   Historical Provider, MD  traMADol (ULTRAM) 50 MG tablet Take 1-2 tablets (50-100 mg total) by mouth every 8 (eight) hours as needed for moderate pain. 07/26/14   Wylene Simmer, MD  vitamin B-12 (CYANOCOBALAMIN) 250 MCG tablet Take 250 mcg by mouth daily.    Historical Provider, MD  vitamin C (ASCORBIC ACID) 500 MG tablet Take 1,000 mg by mouth daily.     Historical Provider, MD   BP 131/75 mmHg  Pulse 79  Temp(Src) 97.7 F (36.5 C) (Oral)  Resp 16  Ht 5' 1.5" (1.562 m)  SpO2 96% Physical Exam  Constitutional: She appears well-developed and well-nourished. No distress.  HENT:  Head: Normocephalic.  Mouth/Throat: No oropharyngeal exudate.  Eyes: Pupils are equal, round, and reactive to light.  Neck: Normal range of motion. Neck supple.  Cardiovascular: Normal rate and intact distal pulses.  Exam reveals no gallop.   No murmur heard. Occasional PVCs, otherwise regular rhythm  Pulmonary/Chest: Effort normal and breath sounds normal. No respiratory distress. She has no wheezes. She has no rales.  Abdominal: She exhibits no distension. There is no tenderness.  Musculoskeletal: She exhibits no edema.  Neurological: She is alert.  Skin: Skin is warm. No rash  noted.  Nursing note and vitals reviewed.   ED Course  Procedures (including critical care time) Labs Review Labs Reviewed  BASIC METABOLIC PANEL - Abnormal; Notable for the following:    Glucose, Bld 120 (*)    Creatinine, Ser 1.03 (*)    GFR calc non Af Amer 51 (*)    GFR calc Af Amer 60 (*)    All other components within normal limits  CBC  I-STAT TROPOININ, ED    Imaging Review Dg Chest 2 View  09/10/2015  CLINICAL DATA:  Chest pain, heart fluttering EXAM: CHEST  2 VIEW COMPARISON:  None. FINDINGS: The heart size and mediastinal contours are within normal limits. Both lungs are clear. The visualized skeletal structures are unremarkable. IMPRESSION: No active cardiopulmonary disease. Electronically Signed   By: Kathreen Devoid   On: 09/10/2015 14:06   I have personally  reviewed and evaluated these images and lab results as part of my medical decision-making.   EKG Interpretation   Date/Time:  Tuesday September 10 2015 13:27:59 EDT Ventricular Rate:  92 PR Interval:  174 QRS Duration: 72 QT Interval:  378 QTC Calculation: 467 R Axis:   -25 Text Interpretation:  Sinus rhythm with frequent Premature ventricular  complexes Cannot rule out Anterior infarct , age undetermined Abnormal ECG  ED PHYSICIAN INTERPRETATION AVAILABLE IN [facility] Confirmed by  TEST, Record ([id number]) on 09/11/2015 7:13:09 AM      MDM   75 yo F with PMHx of arthritis, remote bladder CA who p/w palpitations. On arrival, VSS and WNL. Exam is as above. Pt noted to have frequent PVCs on telemetry and exam but no evidence of AFib, SVT, or aberrant rhythm. Suspect mild symptomatic PVCs. Pt has no hypotension or signs of HD instability. She denies any associated CP, SOB, or signs of angina or inadequate perfusion. Etiology unclear - suspect primary benign PVCs, but will send labs to evaluate for electrolyte abnormalities. No recent infectious symptoms. EKG is non-ischemic.  Labs/imaging as above. CXR is clear.  CBC with no leukocytosis or anemia. BMP unremarkable. Trop negative x 2. Mag, phos, and TSH are WNL. Symptoms completely resolved. Given normal labs, absence of anginal sx or signs of ischemia, and based on shared decision making discussion with pt, will d/c with outpt f/u for likely outpt cardiac monitoring. Return precautions given.  Clinical Impression: 1. PVC (premature ventricular contraction)     Disposition: Discharge  Condition: Good  I have discussed the results, Dx and Tx plan with the pt(& family if present). He/she/they expressed understanding and agree(s) with the plan. Discharge instructions discussed at great length. Strict return precautions discussed and pt &/or family have verbalized understanding of the instructions. No further questions at time of discharge.    Discharge Medication List as of 09/10/2015  9:30 PM      Follow Up: Cassandria Anger, MD [place]  [id number] [phone]   Follow-up in 2-3 days  [place] [phone]  Call to set up an appointment in 1 week or sooner, at the next available appointment, to discuss outpatietn cardiac monitoring   Pt seen in conjunction with Dr. Andrey Cota, MD 09/11/15 1445  Dorie Rank, MD 09/12/15 2132

## 2015-09-10 NOTE — ED Notes (Addendum)
Pt states she has been having chest pain/fluttering in her chest off and on x 1 week. Pt aslo c/o sob last week. Pt denies pain at the present time

## 2015-09-10 NOTE — Discharge Instructions (Signed)
Premature Ventricular Contraction A premature ventricular contraction is an irregularity in the normal heart rhythm. These contractions are extra heartbeats that occur too early in the normal sequence. In most cases, these contractions are harmless and do not require treatment. CAUSES Premature ventricular contractions may occur without a known cause. In healthy people, the extra contractions may be caused by:  Smoking.  Drinking alcohol.  Caffeine.  Certain medicines.  Some illegal drugs.  Stress. Sometimes, changes in chemicals in the blood (electrolytes) can also cause premature ventricular contractions. They can also occur in people with heart diseases that cause a decrease in blood flow to the heart. SIGNS AND SYMPTOMS Premature ventricular contractions often do not cause any symptoms. In some cases, you may have a feeling of your heart beating fast or skipping a beat (palpitations). DIAGNOSIS Your health care provider will take your medical history and do a physical exam. During the exam, the health care provider will check for irregular heartbeats. Various tests may be done to help diagnose premature ventricular contractions. These tests may include:  An ECG (electrocardiogram) to monitor the electrical activity of your heart.  Holter monitor testing. A Holter monitor is a portable device that can monitor the electrical activity of your heart over longer periods of time.  Stress tests to see how exercise affects your heart rhythm.  Echocardiogram. This test uses sound waves (ultrasound) to produce an image of your heart.  Electrophysiology study. This is used to evaluate the electrical conduction system of your heart. TREATMENT Usually, no treatment is needed. You may be advised to avoid things that can trigger the premature contractions, such as caffeine or alcohol. Medicines are sometimes given if symptoms are severe or if the extra heartbeats are very frequent. Treatment may  also be needed for an underlying cause of the contractions if one is found. HOME CARE INSTRUCTIONS  Take medicines only as directed by your health care provider.  Make any lifestyle changes recommended by your health care provider. These may include:  Quitting smoking.  Avoiding or limiting caffeine or alcohol.  Exercising. Talk to your health care provider about what type of exercise is safe for you.  Trying to reduce stress.  Keep all follow-up visits with your health care provider. This is important. SEEK IMMEDIATE MEDICAL CARE IF:  You feel palpitations that are frequent or continual.  You have chest pain.  You have shortness of breath.  You have sweating for no reason.  You have nausea and vomiting.  You become light-headed or faint.   This information is not intended to replace advice given to you by your health care provider. Make sure you discuss any questions you have with your health care provider.   Document Released: 01/03/2004 Document Revised: 06/08/2014 Document Reviewed: 10/19/2013 Elsevier Interactive Patient Education 2016 [employer]  

## 2015-09-10 NOTE — ED Notes (Signed)
Pt and husband requesting RN to talk with the Dr. About why she has to have and IV started and receive fluids. Pt informed RN that they were under the impression that they were about to go home. Awaiting assigned resident to end his phone call.

## 2015-09-20 ENCOUNTER — Encounter: Payer: Self-pay | Admitting: Cardiology

## 2015-09-20 ENCOUNTER — Ambulatory Visit (INDEPENDENT_AMBULATORY_CARE_PROVIDER_SITE_OTHER): Payer: Medicare Other | Admitting: Cardiology

## 2015-09-20 VITALS — BP 120/82 | HR 71 | Ht 61.0 in | Wt 195.6 lb

## 2015-09-20 DIAGNOSIS — I493 Ventricular premature depolarization: Secondary | ICD-10-CM

## 2015-09-20 NOTE — Patient Instructions (Addendum)
Medication Instructions:    Your physician recommends that you continue on your current medications as directed. Please refer to the Current Medication list given to you today.  Labwork:  None ordered  Testing/Procedures:  None ordered  Follow-Up:  Your physician wants you to follow-up in: 6 months with Dr. Camnitz.  You will receive a reminder letter in the mail two months in advance. If you don't receive a letter, please call our office to schedule the follow-up appointment.  - If you need a refill on your cardiac medications before your next appointment, please call your pharmacy.    Thank you for choosing [facility]!!   Grayland Daisey, RN [phone]         

## 2015-09-20 NOTE — Progress Notes (Signed)
Cardiology Office Note   Date:  09/20/2015   ID:  Mariah Farrell, Mariah Farrell 1941/03/04, MRN [id number]  PCP:  Walker Kehr, MD  Cardiologist:   Constance Haw, MD    Chief Complaint  Patient presents with  . Advice Only  . PVC's     History of Present Illness: Mariah Farrell is a 75 y.o. female who presents today for cardiology evaluation.   She has had a fluttering in her chest.  It was associated with tightness.  The fluttering started 3 weeks ago.  It was not associated with SOB.  She went to the ER and was noted to have PVCs on the monitor.  She had a normal cardiac workup at that time.  Since he was in the emergency room, she feels like her PVCs have improved. She has not been having any over the last few days. She says that she has no chest pain, no shortness of breath, PND, or orthopnea. She otherwise feels well and is able to do all of her daily activities.   Today, she denies symptoms of palpitations, chest pain, shortness of breath, orthopnea, PND, lower extremity edema, claudication, dizziness, presyncope, syncope, bleeding, or neurologic sequela. The patient is tolerating medications without difficulties and is otherwise without complaint today.    Past Medical History  Diagnosis Date  . Goiter     synthroid used to shrink  . GERD (gastroesophageal reflux disease)   . Arthritis   . PONV (postoperative nausea and vomiting)   . H/O hiatal hernia   . Headache(784.0)     migraines in morning  . Cancer ([place]) 13 years ago    bladder  . Seasonal allergies    Past Surgical History  Procedure Laterality Date  . Rotator cuff repair Bilateral ~2010, 2012  . Bunionectomy Bilateral   . Bladder surgery  13 years ago  . Total knee arthroplasty Right 07/19/2012    Procedure: TOTAL KNEE ARTHROPLASTY;  Surgeon: Mauri Pole, MD;  Location: [facility];  Service: Orthopedics;  Laterality: Right;  . Tonsillectomy  as child  . Lumbar fusion  May 2014    L4-L5  . Total knee  arthroplasty Left 08/01/2013    Procedure: LEFT TOTAL KNEE ARTHROPLASTY;  Surgeon: Mauri Pole, MD;  Location: [facility];  Service: Orthopedics;  Laterality: Left;  . Colonoscopy    . Arthrodesis foot with weil osteotomy Right 07/26/2014    Procedure: RIGHT HALLUX METATARSOPHALANGEAL JOINT ARTHRODESIS; RIGHT SECOND METATARSAL WEIL OSTEOTOMY; DORSAL CAPSULOTOMY EXTENSOR TENDON LENGTHENING;  Surgeon: Wylene Simmer, MD;  Location: [place];  Service: Orthopedics;  Laterality: Right;     Current Outpatient Prescriptions  Medication Sig Dispense Refill  . aspirin 81 MG tablet Take 81 mg by mouth daily.    . Biotin 5000 MCG TABS Take 1 tablet by mouth daily.    . calcium-vitamin D (OSCAL WITH D) 500-200 MG-UNIT per tablet Take 1 tablet by mouth daily.    . CELEBREX 200 MG capsule Take 1 capsule (200 mg total) by mouth daily. 90 capsule 3  . cetirizine (ZYRTEC) 10 MG tablet Take 10 mg by mouth daily.    . diazepam (VALIUM) 5 MG tablet TAKE HALF TO ONE TAB BY MOUTHEVERY 12 HOURS AS NEEDED FOR ANXIETY 60 tablet 5  . gabapentin (NEURONTIN) 300 MG capsule Take 300 mg by mouth at bedtime as needed. Back pain    . glucosamine-chondroitin 500-400 MG tablet Take 1 tablet by mouth daily.    [name]  lansoprazole (PREVACID) 15 MG capsule Take 15 mg by mouth daily.    [name] levothyroxine (SYNTHROID, LEVOTHROID) 25 MCG tablet TAKE ONE TABLET BY MOUTH ONE TIME DAILY BEFORE BREAKFAST 90 tablet 3  . magnesium oxide (MAG-OX) 400 MG tablet Take 400 mg by mouth daily.    . Multiple Vitamin (MULTIVITAMIN) tablet Take 1 tablet by mouth daily.    . timolol (TIMOPTIC) 0.5 % ophthalmic solution     . traMADol (ULTRAM) 50 MG tablet Take 1-2 tablets (50-100 mg total) by mouth every 8 (eight) hours as needed for moderate pain. 80 tablet 0  . vitamin B-12 (CYANOCOBALAMIN) 250 MCG tablet Take 250 mcg by mouth daily.    . vitamin C (ASCORBIC ACID) 500 MG tablet Take 1,000 mg by mouth daily.      No current  facility-administered medications for this visit.    Allergies:   Crab; Esomeprazole magnesium; and Penicillins   Social History:  The patient  reports that she quit smoking about 51 years ago. Her smoking use included Cigarettes. She has a 1.25 pack-year smoking history. She has never used smokeless tobacco. She reports that she drinks alcohol. She reports that she does not use illicit drugs.   Family History:  The patient's family history includes Brain cancer in her father; Cancer (age of onset: 62) in her brother.    ROS:  Please see the history of present illness.   Otherwise, review of systems is positive for palpitations, back pain.   All other systems are reviewed and negative.    PHYSICAL EXAM: VS:  BP 120/82 mmHg  Pulse 71  Ht 5\' 1"  (1.549 m)  Wt 195 lb 9.6 oz (88.724 kg)  BMI 36.98 kg/m2 , BMI Body mass index is 36.98 kg/(m^2). GEN: Well nourished, well developed, in no acute distress HEENT: normal Neck: no JVD, carotid bruits, or masses Cardiac: RRR; no murmurs, rubs, or gallops,no edema  Respiratory:  clear to auscultation bilaterally, normal work of breathing GI: soft, nontender, nondistended, + BS MS: no deformity or atrophy Skin: warm and dry Neuro:  Strength and sensation are intact Psych: euthymic mood, full affect  EKG:  EKG is ordered today. The ekg ordered today shows sinus rhythm, low voltage  Recent Labs: 12/20/2014: ALT 23 09/10/2015: BUN 18; Creatinine, Ser 1.03*; Hemoglobin 14.1; Magnesium 2.2; Platelets 263; Potassium 4.2; Sodium 143; TSH 1.817    Lipid Panel     Component Value Date/Time   CHOL 201* 12/20/2014 0945   TRIG 129.0 12/20/2014 0945   TRIG 91 04/01/2006 1028   HDL 60.00 12/20/2014 0945   CHOLHDL 3 12/20/2014 0945   CHOLHDL 3.1 CALC 04/01/2006 1028   VLDL 25.8 12/20/2014 0945   LDLCALC 115* 12/20/2014 0945     Wt Readings from Last 3 Encounters:  09/20/15 195 lb 9.6 oz (88.724 kg)  12/20/14 194 lb 12 oz (88.338 kg)  07/26/14  191 lb (86.637 kg)      Other studies Reviewed: Additional studies/ records that were reviewed today include: ER notes    ASSESSMENT AND PLAN:  1.  PVCs: Likely the cause for palpitations. She is feeling much better today without any issues of PVCs. I have offered her the option of a Holter monitor to determine the burden of PVCs. Since she is feeling well today, she does not feel like this is necessary. I have told her and her husband, who is an orthopedic surgeon retired, that if she starts to have more symptoms of PVCs, or any heart  failure type symptoms that she should call back to clinic and I Jacqui Headen see her.    Current medicines are reviewed at length with the patient today.   The patient does not have concerns regarding her medicines.  The following changes were made today:  none  Labs/ tests ordered today include:  Orders Placed This Encounter  Procedures  . EKG 12-Lead     Disposition:   FU with Viviene Thurston 6 months  Signed, Akhila Mahnken Meredith Leeds, MD  09/20/2015 11:02 AM     [facility] [address] [place] [address] [phone] (office) [phone] (fax)

## 2015-10-16 DIAGNOSIS — M5136 Other intervertebral disc degeneration, lumbar region: Secondary | ICD-10-CM | POA: Diagnosis not present

## 2015-10-16 DIAGNOSIS — M5416 Radiculopathy, lumbar region: Secondary | ICD-10-CM | POA: Diagnosis not present

## 2015-11-08 DIAGNOSIS — H401122 Primary open-angle glaucoma, left eye, moderate stage: Secondary | ICD-10-CM | POA: Diagnosis not present

## 2015-11-08 DIAGNOSIS — H401111 Primary open-angle glaucoma, right eye, mild stage: Secondary | ICD-10-CM | POA: Diagnosis not present

## 2015-11-11 DIAGNOSIS — K219 Gastro-esophageal reflux disease without esophagitis: Secondary | ICD-10-CM | POA: Diagnosis not present

## 2015-11-11 DIAGNOSIS — K3189 Other diseases of stomach and duodenum: Secondary | ICD-10-CM | POA: Diagnosis not present

## 2015-11-11 DIAGNOSIS — Z1211 Encounter for screening for malignant neoplasm of colon: Secondary | ICD-10-CM | POA: Diagnosis not present

## 2015-11-11 DIAGNOSIS — K648 Other hemorrhoids: Secondary | ICD-10-CM | POA: Diagnosis not present

## 2015-11-11 DIAGNOSIS — K641 Second degree hemorrhoids: Secondary | ICD-10-CM | POA: Diagnosis not present

## 2015-11-11 DIAGNOSIS — K228 Other specified diseases of esophagus: Secondary | ICD-10-CM | POA: Diagnosis not present

## 2015-11-11 DIAGNOSIS — Z09 Encounter for follow-up examination after completed treatment for conditions other than malignant neoplasm: Secondary | ICD-10-CM | POA: Diagnosis not present

## 2015-11-11 DIAGNOSIS — Z86010 Personal history of colonic polyps: Secondary | ICD-10-CM | POA: Diagnosis not present

## 2015-11-11 DIAGNOSIS — K5730 Diverticulosis of large intestine without perforation or abscess without bleeding: Secondary | ICD-10-CM | POA: Diagnosis not present

## 2015-11-11 DIAGNOSIS — K449 Diaphragmatic hernia without obstruction or gangrene: Secondary | ICD-10-CM | POA: Diagnosis not present

## 2015-12-13 DIAGNOSIS — L820 Inflamed seborrheic keratosis: Secondary | ICD-10-CM | POA: Diagnosis not present

## 2015-12-13 DIAGNOSIS — L720 Epidermal cyst: Secondary | ICD-10-CM | POA: Diagnosis not present

## 2015-12-13 DIAGNOSIS — Z85828 Personal history of other malignant neoplasm of skin: Secondary | ICD-10-CM | POA: Diagnosis not present

## 2015-12-13 DIAGNOSIS — L821 Other seborrheic keratosis: Secondary | ICD-10-CM | POA: Diagnosis not present

## 2015-12-13 DIAGNOSIS — L723 Sebaceous cyst: Secondary | ICD-10-CM | POA: Diagnosis not present

## 2015-12-13 DIAGNOSIS — D1801 Hemangioma of skin and subcutaneous tissue: Secondary | ICD-10-CM | POA: Diagnosis not present

## 2015-12-13 DIAGNOSIS — D2262 Melanocytic nevi of left upper limb, including shoulder: Secondary | ICD-10-CM | POA: Diagnosis not present

## 2015-12-13 DIAGNOSIS — D2261 Melanocytic nevi of right upper limb, including shoulder: Secondary | ICD-10-CM | POA: Diagnosis not present

## 2015-12-13 DIAGNOSIS — D225 Melanocytic nevi of trunk: Secondary | ICD-10-CM | POA: Diagnosis not present

## 2015-12-13 DIAGNOSIS — L814 Other melanin hyperpigmentation: Secondary | ICD-10-CM | POA: Diagnosis not present

## 2015-12-24 DIAGNOSIS — M8589 Other specified disorders of bone density and structure, multiple sites: Secondary | ICD-10-CM | POA: Diagnosis not present

## 2015-12-24 DIAGNOSIS — M810 Age-related osteoporosis without current pathological fracture: Secondary | ICD-10-CM | POA: Diagnosis not present

## 2015-12-24 DIAGNOSIS — N6002 Solitary cyst of left breast: Secondary | ICD-10-CM | POA: Diagnosis not present

## 2015-12-24 DIAGNOSIS — N63 Unspecified lump in breast: Secondary | ICD-10-CM | POA: Diagnosis not present

## 2015-12-24 LAB — HM DEXA SCAN

## 2016-01-02 ENCOUNTER — Encounter: Payer: Self-pay | Admitting: Internal Medicine

## 2016-01-06 DIAGNOSIS — M79671 Pain in right foot: Secondary | ICD-10-CM | POA: Diagnosis not present

## 2016-01-06 DIAGNOSIS — L600 Ingrowing nail: Secondary | ICD-10-CM | POA: Diagnosis not present

## 2016-01-28 ENCOUNTER — Telehealth: Payer: Self-pay

## 2016-01-28 NOTE — Telephone Encounter (Signed)
Call to Mrs. Eulas Post and left message for Ms. Klingberg to schedule the AWV.  Stated that she was due as well. Asked her to call and try to schedule her CPE with Dr. Alain Marion on Wed and then I can see her before or after her apt. She will try to do this.

## 2016-02-04 ENCOUNTER — Telehealth: Payer: Self-pay

## 2016-02-04 NOTE — Telephone Encounter (Signed)
CELEBREX 200 MG capsule   Patient is requesting a refill on this medication. It is a mail order : [phone]

## 2016-02-05 MED ORDER — CELEBREX 200 MG PO CAPS: 200.0000 mg | ORAL_CAPSULE | Freq: Every day | ORAL | 0 refills | Status: DC

## 2016-02-05 NOTE — Telephone Encounter (Signed)
Rx printed/signed/faxed to [email or web address] @ [phone]. Pt informed

## 2016-02-11 DIAGNOSIS — M5136 Other intervertebral disc degeneration, lumbar region: Secondary | ICD-10-CM | POA: Diagnosis not present

## 2016-02-11 DIAGNOSIS — M961 Postlaminectomy syndrome, not elsewhere classified: Secondary | ICD-10-CM | POA: Diagnosis not present

## 2016-02-12 DIAGNOSIS — M5416 Radiculopathy, lumbar region: Secondary | ICD-10-CM | POA: Diagnosis not present

## 2016-02-16 ENCOUNTER — Other Ambulatory Visit: Payer: Self-pay | Admitting: Internal Medicine

## 2016-03-02 DIAGNOSIS — M5136 Other intervertebral disc degeneration, lumbar region: Secondary | ICD-10-CM | POA: Diagnosis not present

## 2016-03-04 ENCOUNTER — Encounter: Payer: Self-pay | Admitting: Internal Medicine

## 2016-03-04 ENCOUNTER — Ambulatory Visit (INDEPENDENT_AMBULATORY_CARE_PROVIDER_SITE_OTHER): Payer: Medicare Other | Admitting: Internal Medicine

## 2016-03-04 ENCOUNTER — Other Ambulatory Visit (INDEPENDENT_AMBULATORY_CARE_PROVIDER_SITE_OTHER): Payer: Medicare Other

## 2016-03-04 VITALS — BP 120/80 | HR 76 | Ht 61.5 in | Wt 198.0 lb

## 2016-03-04 DIAGNOSIS — G8929 Other chronic pain: Secondary | ICD-10-CM | POA: Diagnosis not present

## 2016-03-04 DIAGNOSIS — Z0000 Encounter for general adult medical examination without abnormal findings: Secondary | ICD-10-CM

## 2016-03-04 DIAGNOSIS — M810 Age-related osteoporosis without current pathological fracture: Secondary | ICD-10-CM | POA: Diagnosis not present

## 2016-03-04 DIAGNOSIS — E785 Hyperlipidemia, unspecified: Secondary | ICD-10-CM

## 2016-03-04 DIAGNOSIS — Z23 Encounter for immunization: Secondary | ICD-10-CM | POA: Diagnosis not present

## 2016-03-04 DIAGNOSIS — M5440 Lumbago with sciatica, unspecified side: Secondary | ICD-10-CM

## 2016-03-04 DIAGNOSIS — R202 Paresthesia of skin: Secondary | ICD-10-CM

## 2016-03-04 LAB — LIPID PANEL
Cholesterol: 194 mg/dL (ref 0–200)
HDL: 54 mg/dL (ref >39.00)
LDL Cholesterol: 107 mg/dL — ABNORMAL HIGH (ref 0–99)
NONHDL: 140.4
TRIGLYCERIDES: 165 mg/dL — AB (ref 0.0–149.0)
Total CHOL/HDL Ratio: 4
VLDL: 33 mg/dL (ref 0.0–40.0)

## 2016-03-04 LAB — URINALYSIS, ROUTINE W REFLEX MICROSCOPIC
BILIRUBIN URINE: NEGATIVE
HGB URINE DIPSTICK: NEGATIVE
Ketones, ur: NEGATIVE
NITRITE: NEGATIVE
PH: 6.5 (ref 5.0–8.0)
Specific Gravity, Urine: 1.01 (ref 1.000–1.030)
Total Protein, Urine: NEGATIVE
UROBILINOGEN UA: 0.2 (ref 0.0–1.0)
Urine Glucose: NEGATIVE

## 2016-03-04 LAB — CBC WITH DIFFERENTIAL/PLATELET
BASOS ABS: 0 10*3/uL (ref 0.0–0.1)
Basophils Relative: 0.9 % (ref 0.0–3.0)
EOS ABS: 0.3 10*3/uL (ref 0.0–0.7)
EOS PCT: 6 % — AB (ref 0.0–5.0)
HCT: 38.9 % (ref 36.0–46.0)
HEMOGLOBIN: 13.4 g/dL (ref 12.0–15.0)
LYMPHS ABS: 1.2 10*3/uL (ref 0.7–4.0)
Lymphocytes Relative: 25.2 % (ref 12.0–46.0)
MCHC: 34.5 g/dL (ref 30.0–36.0)
MCV: 94.1 fl (ref 78.0–100.0)
MONO ABS: 0.5 10*3/uL (ref 0.1–1.0)
Monocytes Relative: 10.6 % (ref 3.0–12.0)
NEUTROS PCT: 57.3 % (ref 43.0–77.0)
Neutro Abs: 2.6 10*3/uL (ref 1.4–7.7)
Platelets: 266 10*3/uL (ref 150.0–400.0)
RBC: 4.14 Mil/uL (ref 3.87–5.11)
RDW: 13.4 % (ref 11.5–15.5)
WBC: 4.6 10*3/uL (ref 4.0–10.5)

## 2016-03-04 LAB — HEPATIC FUNCTION PANEL
ALBUMIN: 3.8 g/dL (ref 3.5–5.2)
ALK PHOS: 62 U/L (ref 39–117)
ALT: 21 U/L (ref 0–35)
AST: 23 U/L (ref 0–37)
BILIRUBIN DIRECT: 0.1 mg/dL (ref 0.0–0.3)
BILIRUBIN TOTAL: 0.6 mg/dL (ref 0.2–1.2)
Total Protein: 6.6 g/dL (ref 6.0–8.3)

## 2016-03-04 LAB — BASIC METABOLIC PANEL
BUN: 19 mg/dL (ref 6–23)
CALCIUM: 9 mg/dL (ref 8.4–10.5)
CO2: 29 mEq/L (ref 19–32)
Chloride: 106 mEq/L (ref 96–112)
Creatinine, Ser: 0.94 mg/dL (ref 0.40–1.20)
GFR: 61.33 mL/min (ref >60.00)
Glucose, Bld: 88 mg/dL (ref 70–99)
POTASSIUM: 4.2 meq/L (ref 3.5–5.1)
Sodium: 142 mEq/L (ref 135–145)

## 2016-03-04 LAB — VITAMIN B12: Vitamin B-12: 1347 pg/mL — ABNORMAL HIGH (ref 211–911)

## 2016-03-04 LAB — TSH: TSH: 1.23 u[IU]/mL (ref 0.35–4.50)

## 2016-03-04 MED ORDER — GABAPENTIN 300 MG PO CAPS: 300.0000 mg | ORAL_CAPSULE | Freq: Three times a day (TID) | ORAL | 5 refills | Status: DC | PRN

## 2016-03-04 MED ORDER — CELEBREX 200 MG PO CAPS: 200.0000 mg | ORAL_CAPSULE | Freq: Every day | ORAL | 5 refills | Status: DC

## 2016-03-04 NOTE — Addendum Note (Signed)
Addended by: Lyman Bishop on: 03/04/2016 01:32 PM   Modules accepted: Orders

## 2016-03-04 NOTE — Progress Notes (Addendum)
Subjective:   Mariah Farrell is a 75 y.o. female who presents for Medicare Annual (Subsequent) preventive examination.  HRA assessment completed during this visit with  The Patient was informed that the wellness visit is to identify future health risk and educate and initiate measures that can reduce risk for increased disease through the lifespan.    Had MRI on Monday HTN; medically managed; very good today Osteoporosis / states the doctor has discussed prolia  Declined fosomax last year; Is considering  Dyslipidemia; medically managed  Back injury  Recent episode of PVSs  All resolved except for back pain currently which has been ongoing   Psychosocial 4 children; Grandchildren;  34 grandson had Traumatic neck injury approx 77 yo  He is standing now to transfer; was dependent on hoyer lift; this has been very difficult for her and spouse.   Medications; No issues   Stopped Medicare drug coverage  Want cover celebrax or anything that is expensive so they dropped it  Surgical hx:  2 rotator cuffs repairs  Back surgery and bilateral knee replacements  Diet;  Nutritious most of the time Breakfast, lunch and supper and spouse cooks  Eats good diet and fish;   Exercise; Haven't been able to walk. Works out with a Clinical research associate x 2 per week Consistent with weight bearing exercise;  Considering prolia   Functionally ; no issues this year;  IADL; no problem Downsized; does have stairs; downstairs bedroom; They will wait for now  Likes it where they are  On list [facility]; Friends are moving there;   Personal safety issues reviewed:  1. Consider starting a community watch program per [facility] 2.  Changes batteries is smoke detector and/or carbon monoxide detector  3.  If you have firearms; keep them in a safe place 4.  Wear protection when in the sun; Always wear sunscreen or a hat; It is good to have your doctor check your skin annually or review any new areas  of concern 5. Driving safety; Keep in the right lane; stay 3 car lengths behind the car in front of you on the highway; look 3 times prior to pulling out; carry your cell phone everywhere you go!   Learn about the Yellow Dot program: ( educated on this program)  The program allows first responders at your emergency to have access to who your physician is, as well as your medications and medical conditions.  Citizens requesting the Yellow Dot Packages should contact Master Corporal Nunzio Cobbs at the [facility] [phone] for the first week of the program and beginning the week after Easter citizens should contact their Scientist, physiological.  Falls not in the last year  Health Maintenance Due  Topic Date Due  . INFLUENZA VACCINE  12/31/2015  . TETANUS/TDAP  02/12/2016   Had her flu shot today at MD visit  Agreed to take Tdap today in Right deltoid   Cardiac Risk Factors include: advanced age (>23men, >74 women);dyslipidemia Ad8 score reviewed for issues:  Issues making decisions:  Less interest in hobbies / activities:  Repeats questions, stories (family complaining):  Trouble using ordinary gadgets (microwave, computer, phone):  Forgets the month or year:   Mismanaging finances:   Remembering appts:  Daily problems with thinking and/or memory: Ad8 score is=0   Depression neg; situational at times but no issues currently.       Objective:     Agreed to take Tdap and had Flu shot;  Vitals: BP 120/80   Pulse 76   Ht 5' 1.5" (1.562 m)   Wt 198 lb (89.8 kg)   SpO2 96%   BMI 36.81 kg/m   Body mass index is 36.81 kg/m.   Tobacco History  Smoking Status  . Former Smoker  . Packs/day: 0.25  . Years: 5.00  . Types: Cigarettes  . Quit date: 06/01/1964  Smokeless Tobacco  . Never Used     Counseling given: Yes   Past Medical History:  Diagnosis Date  . Arthritis   . Cancer ([place]) 13 years ago   bladder  . GERD  (gastroesophageal reflux disease)   . Goiter    synthroid used to shrink  . H/O hiatal hernia   . Headache(784.0)    migraines in morning  . PONV (postoperative nausea and vomiting)   . Seasonal allergies    Past Surgical History:  Procedure Laterality Date  . ARTHRODESIS FOOT WITH WEIL OSTEOTOMY Right 07/26/2014   Procedure: RIGHT HALLUX METATARSOPHALANGEAL JOINT ARTHRODESIS; RIGHT SECOND METATARSAL WEIL OSTEOTOMY; DORSAL CAPSULOTOMY EXTENSOR TENDON LENGTHENING;  Surgeon: Wylene Simmer, MD;  Location: [place];  Service: Orthopedics;  Laterality: Right;  . BLADDER SURGERY  13 years ago  . BUNIONECTOMY Bilateral   . COLONOSCOPY    . LUMBAR FUSION  May 2014   L4-L5  . ROTATOR CUFF REPAIR Bilateral ~2010, 2012  . TONSILLECTOMY  as child  . TOTAL KNEE ARTHROPLASTY Right 07/19/2012   Procedure: TOTAL KNEE ARTHROPLASTY;  Surgeon: Mauri Pole, MD;  [facility];  Service: Orthopedics;  Laterality: Right;  . TOTAL KNEE ARTHROPLASTY Left 08/01/2013   Procedure: LEFT TOTAL KNEE ARTHROPLASTY;  Surgeon: Mauri Pole, MD;  [facility];  Service: Orthopedics;  Laterality: Left;   Family History  Problem Relation Age of Onset  . Cancer Brother 54    pancreatic  . Brain cancer Father    History  Sexual Activity  . Sexual activity: Not on file    Outpatient Encounter Prescriptions as of 03/04/2016  Medication Sig  . aspirin 81 MG tablet Take 81 mg by mouth daily.  . Biotin 5000 MCG TABS Take 1 tablet by mouth daily.  . calcium-vitamin D (OSCAL WITH D) 500-200 MG-UNIT per tablet Take 1 tablet by mouth daily.  . CELEBREX 200 MG capsule Take 1 capsule (200 mg total) by mouth daily.  . cetirizine (ZYRTEC) 10 MG tablet Take 10 mg by mouth daily.  . diazepam (VALIUM) 5 MG tablet TAKE HALF TO ONE TAB BY MOUTHEVERY 12 HOURS AS NEEDED FOR ANXIETY  . gabapentin (NEURONTIN) 300 MG capsule Take 1 capsule (300 mg total) by mouth 3 (three) times daily as needed. Back pain  .  glucosamine-chondroitin 500-400 MG tablet Take 1 tablet by mouth daily.  . lansoprazole (PREVACID) 15 MG capsule Take 15 mg by mouth daily.  [name] levothyroxine (SYNTHROID, LEVOTHROID) 25 MCG tablet TAKE ONE TABLET BY MOUTH ONE TIME DAILY BEFORE BREAKFAST  . magnesium oxide (MAG-OX) 400 MG tablet Take 400 mg by mouth daily.  . Multiple Vitamin (MULTIVITAMIN) tablet Take 1 tablet by mouth daily.  . timolol (TIMOPTIC) 0.5 % ophthalmic solution   . traMADol (ULTRAM) 50 MG tablet Take 1-2 tablets (50-100 mg total) by mouth every 8 (eight) hours as needed for moderate pain.  . vitamin B-12 (CYANOCOBALAMIN) 250 MCG tablet Take 250 mcg by mouth daily.  . vitamin C (ASCORBIC ACID) 500 MG tablet Take 1,000 mg by mouth daily.   . [DISCONTINUED]  CELEBREX 200 MG capsule Take 1 capsule (200 mg total) by mouth daily.  . [DISCONTINUED] gabapentin (NEURONTIN) 300 MG capsule Take 300 mg by mouth at bedtime as needed. Back pain   No facility-administered encounter medications on file as of 03/04/2016.     Activities of Daily Living In your present state of health, do you have any difficulty performing the following activities: 03/04/2016  Hearing? N  Vision? N  Difficulty concentrating or making decisions? N  Walking or climbing stairs? N  Dressing or bathing? N  Doing errands, shopping? N  Preparing Food and eating ? N  Using the Toilet? N  In the past six months, have you accidently leaked urine? N  Do you have problems with loss of bowel control? N  Managing your Medications? N  Managing your Finances? N  Housekeeping or managing your Housekeeping? N  Some recent data might be hidden    Patient Care Team: Cassandria Anger, MD as PCP - General Paralee Cancel, MD (Orthopedic Surgery) Newton Pigg, MD (Obstetrics and Gynecology) Richmond Campbell, MD (Gastroenterology) Roseanne Kaufman, MD (Orthopedic Surgery)    Assessment:      Exercise Activities and Dietary recommendations Current Exercise  Habits: Structured exercise class, Time (Minutes): 60, Frequency (Times/Week): 2, Weekly Exercise (Minutes/Week): 120, Intensity: Moderate  Goals    . Weight (lb) < 200 lb (90.7 kg)    . Weight (lb) < 200 lb (90.7 kg)          Cut out sugar;       Fall Risk Fall Risk  03/04/2016 03/04/2016  Falls in the past year? No No   Depression Screen PHQ 2/9 Scores 03/04/2016 03/04/2016  PHQ - 2 Score 0 0     Cognitive Testing MMSE - Mini Mental State Exam 03/04/2016  Not completed: (No Data)   Ad8 score is 0   Immunization History  Administered Date(s) Administered  . H1N1 05/07/2008  . Influenza Split 04/01/2012  . Influenza Whole 03/04/2010  . Influenza,inj,Quad PF,36+ Mos 02/20/2014  . Pneumococcal Conjugate-13 12/18/2013  . Pneumococcal Polysaccharide-23 03/11/2006  . Td 02/11/2006  . Tdap 03/04/2016  . Zoster 11/07/2008   Screening Tests Health Maintenance  Topic Date Due  . INFLUENZA VACCINE  12/31/2015  . TETANUS/TDAP  02/12/2016  . DEXA SCAN  Completed  . ZOSTAVAX  Completed  . PNA vac Low Risk Adult  Completed      Plan:     Updated preventive health;  Flu shot taken earlier today at MD visit Tdap given at AWV  Goal is to lose weight;  During the course of the visit the patient was educated and counseled about the following appropriate screening and preventive services:   Vaccines to include Pneumoccal, Influenza, Hepatitis B, Td, Zostavax, HCV  Electrocardiogram  Cardiovascular Disease  Colorectal cancer screening- aged out  Bone density screening - considering prolia   Diabetes screening  Glaucoma screening had pre-glaucoma; given eye drops and followed q 6 months  Mammography/06/2015  Nutrition counseling / will cut back on sugar  Patient Instructions (the written plan) was given to the patient.   [id number], RN  03/04/2016  Medical screening examination/treatment/procedure(s) were performed by non-physician practitioner and as supervising  physician I was immediately available for consultation/collaboration. I agree with above. Walker Kehr, MD

## 2016-03-04 NOTE — Assessment & Plan Note (Signed)
Here for medicare wellness/physical  Diet: heart healthy  Physical activity: not sedentary - walking Depression/mood screen: negative  Hearing: intact to whispered voice  Visual acuity: grossly normal, performs annual eye exam  ADLs: capable  Fall risk: low  Home safety: good  Cognitive evaluation: intact to orientation, naming, recall and repetition  EOL planning: adv directives, full code/ I agree  I have personally reviewed and have noted  1. The patient's medical and social history  2. Their use of alcohol, tobacco or illicit drugs  3. Their current medications and supplements  4. The patient's functional ability including ADL's, fall risks, home safety risks and hearing or visual impairment.  5. Diet and physical activities  6. Evidence for depression or mood disorders 7. Providers roster was discussed    Today patient counseled on age appropriate routine health concerns for screening and prevention, each reviewed and up to date or declined. Immunizations reviewed and up to date or declined. Labs ordered and reviewed. Risk factors for depression reviewed and negative. Hearing function and visual acuity are intact. ADLs screened and addressed as needed. Functional ability and level of safety reviewed and appropriate. Education, counseling and referrals performed based on assessed risks today. Patient provided with a copy of personalized plan for preventive services.  GYN/mammo q 12 mo      

## 2016-03-04 NOTE — Assessment & Plan Note (Signed)
[place]

## 2016-03-04 NOTE — Addendum Note (Signed)
Addended byWynetta Fines on: 03/04/2016 01:28 PM   Modules accepted: Orders

## 2016-03-04 NOTE — Progress Notes (Signed)
Subjective:  Patient ID: Mariah Farrell, female    DOB: 1940/06/27  Age: 75 y.o. MRN: [id number]  CC: No chief complaint on file.   HPI KATOYA OBLANDER presents for a well exam C/o LBP  Outpatient Medications Prior to Visit  Medication Sig Dispense Refill  . aspirin 81 MG tablet Take 81 mg by mouth daily.    . Biotin 5000 MCG TABS Take 1 tablet by mouth daily.    . calcium-vitamin D (OSCAL WITH D) 500-200 MG-UNIT per tablet Take 1 tablet by mouth daily.    . CELEBREX 200 MG capsule Take 1 capsule (200 mg total) by mouth daily. 90 capsule 0  . cetirizine (ZYRTEC) 10 MG tablet Take 10 mg by mouth daily.    . diazepam (VALIUM) 5 MG tablet TAKE HALF TO ONE TAB BY MOUTHEVERY 12 HOURS AS NEEDED FOR ANXIETY 60 tablet 5  . gabapentin (NEURONTIN) 300 MG capsule Take 300 mg by mouth at bedtime as needed. Back pain    . glucosamine-chondroitin 500-400 MG tablet Take 1 tablet by mouth daily.    . lansoprazole (PREVACID) 15 MG capsule Take 15 mg by mouth daily.    [name] levothyroxine (SYNTHROID, LEVOTHROID) 25 MCG tablet TAKE ONE TABLET BY MOUTH ONE TIME DAILY BEFORE BREAKFAST 90 tablet 3  . magnesium oxide (MAG-OX) 400 MG tablet Take 400 mg by mouth daily.    . Multiple Vitamin (MULTIVITAMIN) tablet Take 1 tablet by mouth daily.    . timolol (TIMOPTIC) 0.5 % ophthalmic solution     . traMADol (ULTRAM) 50 MG tablet Take 1-2 tablets (50-100 mg total) by mouth every 8 (eight) hours as needed for moderate pain. 80 tablet 0  . vitamin B-12 (CYANOCOBALAMIN) 250 MCG tablet Take 250 mcg by mouth daily.    . vitamin C (ASCORBIC ACID) 500 MG tablet Take 1,000 mg by mouth daily.      No facility-administered medications prior to visit.     ROS Review of Systems  Constitutional: Negative for activity change, appetite change, chills, fatigue and unexpected weight change.  HENT: Negative for congestion, mouth sores and sinus pressure.   Eyes: Negative for visual disturbance.  Respiratory: Negative for  cough and chest tightness.   Gastrointestinal: Negative for abdominal pain and nausea.  Genitourinary: Negative for difficulty urinating, frequency and vaginal pain.  Musculoskeletal: Positive for arthralgias, back pain and gait problem.  Skin: Negative for pallor and rash.  Neurological: Negative for dizziness, tremors, weakness, numbness and headaches.  Psychiatric/Behavioral: Negative for confusion, sleep disturbance and suicidal ideas.    Objective:  BP 130/90   Ht 5' 1.5" (1.562 m)   Wt 198 lb (89.8 kg)   BMI 36.81 kg/m   BP Readings from Last 3 Encounters:  03/04/16 130/90  09/20/15 120/82  09/10/15 112/60    Wt Readings from Last 3 Encounters:  03/04/16 198 lb (89.8 kg)  09/20/15 195 lb 9.6 oz (88.7 kg)  12/20/14 194 lb 12 oz (88.3 kg)    Physical Exam  Constitutional: She appears well-developed. No distress.  HENT:  Head: Normocephalic.  Right Ear: External ear normal.  Left Ear: External ear normal.  Nose: Nose normal.  Mouth/Throat: Oropharynx is clear and moist.  Eyes: Conjunctivae are normal. Pupils are equal, round, and reactive to light. Right eye exhibits no discharge. Left eye exhibits no discharge.  Neck: Normal range of motion. Neck supple. No JVD present. No tracheal deviation present. No thyromegaly present.  Cardiovascular: Normal rate, regular rhythm and  normal heart sounds.   Pulmonary/Chest: No stridor. No respiratory distress. She has no wheezes.  Abdominal: Soft. Bowel sounds are normal. She exhibits no distension and no mass. There is no tenderness. There is no rebound and no guarding.  Musculoskeletal: She exhibits no edema or tenderness.  Lymphadenopathy:    She has no cervical adenopathy.  Neurological: She displays normal reflexes. No cranial nerve deficit. She exhibits normal muscle tone. Coordination normal.  Skin: No rash noted. No erythema.  Psychiatric: She has a normal mood and affect. Her behavior is normal. Judgment and thought  content normal.    Lab Results  Component Value Date   WBC 7.7 09/10/2015   HGB 14.1 09/10/2015   HCT 42.9 09/10/2015   PLT 263 09/10/2015   GLUCOSE 120 (H) 09/10/2015   CHOL 201 (H) 12/20/2014   TRIG 129.0 12/20/2014   HDL 60.00 12/20/2014   LDLCALC 115 (H) 12/20/2014   ALT 23 12/20/2014   AST 25 12/20/2014   NA 143 09/10/2015   K 4.2 09/10/2015   CL 108 09/10/2015   CREATININE 1.03 (H) 09/10/2015   BUN 18 09/10/2015   CO2 22 09/10/2015   TSH 1.817 09/10/2015   INR 1.01 07/24/2013    Dg Chest 2 View  Result Date: 09/10/2015 CLINICAL DATA:  Chest pain, heart fluttering EXAM: CHEST  2 VIEW COMPARISON:  None. FINDINGS: The heart size and mediastinal contours are within normal limits. Both lungs are clear. The visualized skeletal structures are unremarkable. IMPRESSION: No active cardiopulmonary disease. Electronically Signed   By: Kathreen Devoid   On: 09/10/2015 14:06    Assessment & Plan:   There are no diagnoses linked to this encounter. I am having Ms. Eulas Post maintain her cetirizine, multivitamin, vitamin C, lansoprazole, calcium-vitamin D, glucosamine-chondroitin, magnesium oxide, Biotin, gabapentin, vitamin B-12, traMADol, timolol, levothyroxine, diazepam, aspirin, and CELEBREX.  No orders of the defined types were placed in this encounter.    Follow-up: No Follow-up on file.  Walker Kehr, MD

## 2016-03-04 NOTE — Addendum Note (Signed)
Addended by: Cassandria Anger on: 03/04/2016 11:45 AM   Modules accepted: Orders

## 2016-03-04 NOTE — Patient Instructions (Addendum)
GERD wedge and pillow  Try to add OTC Protonix  Will take the Tdap today    [name] , Thank you for taking time to come for your Medicare Wellness Visit. I appreciate your ongoing commitment to your health goals. Please review the following plan we discussed and let me know if I can assist you in the future.   These are the goals we discussed: Goals    . Weight (lb) < 200 lb (90.7 kg)    . Weight (lb) < 200 lb (90.7 kg)          Cut out sugar;        This is a list of the screening recommended for you and due dates:  Health Maintenance  Topic Date Due  . Flu Shot  12/31/2015  . Tetanus Vaccine  02/12/2016  . DEXA scan (bone density measurement)  Completed  . Shingles Vaccine  Completed  . Pneumonia vaccines  Completed   Menopause is a normal process in which your reproductive ability comes to an end. This process happens gradually over a span of months to years, usually between the ages of 53 and 53. Menopause is complete when you have missed 12 consecutive menstrual periods. It is important to talk with your health care provider about some of the most common conditions that affect postmenopausal women, such as heart disease, cancer, and bone loss (osteoporosis). Adopting a healthy lifestyle and getting preventive care can help to promote your health and wellness. Those actions can also lower your chances of developing some of these common conditions. WHAT SHOULD I KNOW ABOUT MENOPAUSE? During menopause, you may experience a number of symptoms, such as:  Moderate-to-severe hot flashes.  Night sweats.  Decrease in sex drive.  Mood swings.  Headaches.  Tiredness.  Irritability.  Memory problems.  Insomnia. Choosing to treat or not to treat menopausal changes is an individual decision that you make with your health care provider. WHAT SHOULD I KNOW ABOUT HORMONE REPLACEMENT THERAPY AND SUPPLEMENTS? Hormone therapy products are effective for treating symptoms that  are associated with menopause, such as hot flashes and night sweats. Hormone replacement carries certain risks, especially as you become older. If you are thinking about using estrogen or estrogen with progestin treatments, discuss the benefits and risks with your health care provider. WHAT SHOULD I KNOW ABOUT HEART DISEASE AND STROKE? Heart disease, heart attack, and stroke become more likely as you age. This may be due, in part, to the hormonal changes that your body experiences during menopause. These can affect how your body processes dietary fats, triglycerides, and cholesterol. Heart attack and stroke are both medical emergencies. There are many things that you can do to help prevent heart disease and stroke:  Have your blood pressure checked at least every 1-2 years. High blood pressure causes heart disease and increases the risk of stroke.  If you are 43-32 years old, ask your health care provider if you should take aspirin to prevent a heart attack or a stroke.  Do not use any tobacco products, including cigarettes, chewing tobacco, or electronic cigarettes. If you need help quitting, ask your health care provider.  It is important to eat a healthy diet and maintain a healthy weight.  Be sure to include plenty of vegetables, fruits, low-fat dairy products, and lean protein.  Avoid eating foods that are high in solid fats, added sugars, or salt (sodium).  Get regular exercise. This is one of the most important things that you  can do for your health.  Try to exercise for at least 150 minutes each week. The type of exercise that you do should increase your heart rate and make you sweat. This is known as moderate-intensity exercise.  Try to do strengthening exercises at least twice each week. Do these in addition to the moderate-intensity exercise.  Know your numbers.Ask your health care provider to check your cholesterol and your blood glucose. Continue to have your blood tested as  directed by your health care provider. WHAT SHOULD I KNOW ABOUT CANCER SCREENING? There are several types of cancer. Take the following steps to reduce your risk and to catch any cancer development as early as possible. Breast Cancer  Practice breast self-awareness.  This means understanding how your breasts normally appear and feel.  It also means doing regular breast self-exams. Let your health care provider know about any changes, no matter how small.  If you are 71 or older, have a clinician do a breast exam (clinical breast exam or CBE) every year. Depending on your age, family history, and medical history, it may be recommended that you also have a yearly breast X-ray (mammogram).  If you have a family history of breast cancer, talk with your health care provider about genetic screening.  If you are at high risk for breast cancer, talk with your health care provider about having an MRI and a mammogram every year.  Breast cancer (BRCA) gene test is recommended for women who have family members with BRCA-related cancers. Results of the assessment will determine the need for genetic counseling and BRCA1 and for BRCA2 testing. BRCA-related cancers include these types:  Breast. This occurs in males or females.  Ovarian.  Tubal. This may also be called fallopian tube cancer.  Cancer of the abdominal or pelvic lining (peritoneal cancer).  Prostate.  Pancreatic. Cervical, Uterine, and Ovarian Cancer Your health care provider may recommend that you be screened regularly for cancer of the pelvic organs. These include your ovaries, uterus, and vagina. This screening involves a pelvic exam, which includes checking for microscopic changes to the surface of your cervix (Pap test).  For women ages 21-65, health care providers may recommend a pelvic exam and a Pap test every three years. For women ages 3-65, they may recommend the Pap test and pelvic exam, combined with testing for human  papilloma virus (HPV), every five years. Some types of HPV increase your risk of cervical cancer. Testing for HPV may also be done on women of any age who have unclear Pap test results.  Other health care providers may not recommend any screening for nonpregnant women who are considered low risk for pelvic cancer and have no symptoms. Ask your health care provider if a screening pelvic exam is right for you.  If you have had past treatment for cervical cancer or a condition that could lead to cancer, you need Pap tests and screening for cancer for at least 20 years after your treatment. If Pap tests have been discontinued for you, your risk factors (such as having a new sexual partner) need to be reassessed to determine if you should start having screenings again. Some women have medical problems that increase the chance of getting cervical cancer. In these cases, your health care provider may recommend that you have screening and Pap tests more often.  If you have a family history of uterine cancer or ovarian cancer, talk with your health care provider about genetic screening.  If you have vaginal  bleeding after reaching menopause, tell your health care provider.  There are currently no reliable tests available to screen for ovarian cancer. Lung Cancer Lung cancer screening is recommended for adults 39-39 years old who are at high risk for lung cancer because of a history of smoking. A yearly low-dose CT scan of the lungs is recommended if you:  Currently smoke.  Have a history of at least 30 pack-years of smoking and you currently smoke or have quit within the past 15 years. A pack-year is smoking an average of one pack of cigarettes per day for one year. Yearly screening should:  Continue until it has been 15 years since you quit.  Stop if you develop a health problem that would prevent you from having lung cancer treatment. Colorectal Cancer  This type of cancer can be detected and can  often be prevented.  Routine colorectal cancer screening usually begins at age 61 and continues through age 6.  If you have risk factors for colon cancer, your health care provider may recommend that you be screened at an earlier age.  If you have a family history of colorectal cancer, talk with your health care provider about genetic screening.  Your health care provider may also recommend using home test kits to check for hidden blood in your stool.  A small camera at the end of a tube can be used to examine your colon directly (sigmoidoscopy or colonoscopy). This is done to check for the earliest forms of colorectal cancer.  Direct examination of the colon should be repeated every 5-10 years until age 76. However, if early forms of precancerous polyps or small growths are found or if you have a family history or genetic risk for colorectal cancer, you may need to be screened more often. Skin Cancer  Check your skin from head to toe regularly.  Monitor any moles. Be sure to tell your health care provider:  About any new moles or changes in moles, especially if there is a change in a mole's shape or color.  If you have a mole that is larger than the size of a pencil eraser.  If any of your family members has a history of skin cancer, especially at a young age, talk with your health care provider about genetic screening.  Always use sunscreen. Apply sunscreen liberally and repeatedly throughout the day.  Whenever you are outside, protect yourself by wearing long sleeves, pants, a wide-brimmed hat, and sunglasses. WHAT SHOULD I KNOW ABOUT OSTEOPOROSIS? Osteoporosis is a condition in which bone destruction happens more quickly than new bone creation. After menopause, you may be at an increased risk for osteoporosis. To help prevent osteoporosis or the bone fractures that can happen because of osteoporosis, the following is recommended:  If you are 8-32 years old, get at least 1,000 mg  of calcium and at least 600 mg of vitamin D per day.  If you are older than age 41 but younger than age 43, get at least 1,200 mg of calcium and at least 600 mg of vitamin D per day.  If you are older than age 74, get at least 1,200 mg of calcium and at least 800 mg of vitamin D per day. Smoking and excessive alcohol intake increase the risk of osteoporosis. Eat foods that are rich in calcium and vitamin D, and do weight-bearing exercises several times each week as directed by your health care provider. WHAT SHOULD I KNOW ABOUT HOW MENOPAUSE AFFECTS [place]? Depression  may occur at any age, but it is more common as you become older. Common symptoms of depression include:  Low or sad mood.  Changes in sleep patterns.  Changes in appetite or eating patterns.  Feeling an overall lack of motivation or enjoyment of activities that you previously enjoyed.  Frequent crying spells. Talk with your health care provider if you think that you are experiencing depression. WHAT SHOULD I KNOW ABOUT IMMUNIZATIONS? It is important that you get and maintain your immunizations. These include:  Tetanus, diphtheria, and pertussis (Tdap) booster vaccine.  Influenza every year before the flu season begins.  Pneumonia vaccine.  Shingles vaccine. Your health care provider may also recommend other immunizations.   This information is not intended to replace advice given to you by your health care provider. Make sure you discuss any questions you have with your health care provider.   Document Released: 07/10/2005 Document Revised: 06/08/2014 Document Reviewed: 01/18/2014 Elsevier Interactive Patient Education 2016 [place] in the Home  Falls can cause injuries and can affect people from all age groups. There are many simple things that you can do to make your home safe and to help prevent falls. WHAT CAN I DO ON THE OUTSIDE OF MY HOME?  Regularly repair the edges of  walkways and driveways and fix any cracks.  Remove high doorway thresholds.  Trim any shrubbery on the main path into your home.  Use bright outdoor lighting.  Clear walkways of debris and clutter, including tools and rocks.  Regularly check that handrails are securely fastened and in good repair. Both sides of any steps should have handrails.  Install guardrails along the edges of any raised decks or porches.  Have leaves, snow, and ice cleared regularly.  Use sand or salt on walkways during winter months.  In the garage, clean up any spills right away, including grease or oil spills. WHAT CAN I DO IN THE BATHROOM?  Use night lights.  Install grab bars by the toilet and in the tub and shower. Do not use towel bars as grab bars.  Use non-skid mats or decals on the floor of the tub or shower.  If you need to sit down while you are in the shower, use a plastic, non-slip stool.[name]  Keep the floor dry. Immediately clean up any water that spills on the floor.  Remove soap buildup in the tub or shower on a regular basis.  Attach bath mats securely with double-sided non-slip rug tape.  Remove throw rugs and other tripping hazards from the floor. WHAT CAN I DO IN THE BEDROOM?  Use night lights.  Make sure that a bedside light is easy to reach.  Do not use oversized bedding that drapes onto the floor.  Have a firm chair that has side arms to use for getting dressed.  Remove throw rugs and other tripping hazards from the floor. WHAT CAN I DO IN THE KITCHEN?   Clean up any spills right away.  Avoid walking on wet floors.  Place frequently used items in easy-to-reach places.  If you need to reach for something above you, use a sturdy step stool that has a grab bar.  Keep electrical cables out of the way.  Do not use floor polish or wax that makes floors slippery. If you have to use wax, make sure that it is non-skid floor wax.  Remove throw rugs and other tripping  hazards from the floor. WHAT CAN I DO IN THE  STAIRWAYS?  Do not leave any items on the stairs.  Make sure that there are handrails on both sides of the stairs. Fix handrails that are broken or loose. Make sure that handrails are as long as the stairways.  Check any carpeting to make sure that it is firmly attached to the stairs. Fix any carpet that is loose or worn.  Avoid having throw rugs at the top or bottom of stairways, or secure the rugs with carpet tape to prevent them from moving.  Make sure that you have a light switch at the top of the stairs and the bottom of the stairs. If you do not have them, have them installed. WHAT ARE SOME OTHER FALL PREVENTION TIPS?  Wear closed-toe shoes that fit well and support your feet. Wear shoes that have rubber soles or low heels.  When you use a stepladder, make sure that it is completely opened and that the sides are firmly locked. Have someone hold the ladder while you are using it. Do not climb a closed stepladder.  Add color or contrast paint or tape to grab bars and handrails in your home. Place contrasting color strips on the first and last steps.  Use mobility aids as needed, such as canes, walkers, scooters, and crutches.  Turn on lights if it is dark. Replace any light bulbs that burn out.  Set up furniture so that there are clear paths. Keep the furniture in the same spot.  Fix any uneven floor surfaces.  Choose a carpet design that does not hide the edge of steps of a stairway.  Be aware of any and all pets.  Review your medicines with your healthcare provider. Some medicines can cause dizziness or changes in blood pressure, which increase your risk of falling. Talk with your health care provider about other ways that you can decrease your risk of falls. This may include working with a physical therapist or trainer to improve your strength, balance, and endurance.   This information is not intended to replace advice given to  you by your health care provider. Make sure you discuss any questions you have with your health care provider.   Document Released: 05/08/2002 Document Revised: 10/02/2014 Document Reviewed: 06/22/2014 Elsevier Interactive Patient Education [employer].

## 2016-03-04 NOTE — Assessment & Plan Note (Signed)
Tramadol, Gabapentin prn  Potential benefits of a long term opioids use as well as potential risks (i.e. addiction risk, apnea etc) and complications (i.e. Somnolence, constipation and others) were explained to the patient and were aknowledged.

## 2016-03-04 NOTE — Progress Notes (Signed)
Pre visit review using our clinic review tool, if applicable. No additional management support is needed unless otherwise documented below in the visit note. 

## 2016-03-04 NOTE — Assessment & Plan Note (Signed)
B12 labs 

## 2016-03-07 ENCOUNTER — Other Ambulatory Visit: Payer: Self-pay | Admitting: Internal Medicine

## 2016-03-09 MED ORDER — TRAMADOL HCL 50 MG PO TABS: 50.0000 mg | ORAL_TABLET | Freq: Three times a day (TID) | ORAL | 3 refills | Status: DC | PRN

## 2016-03-09 NOTE — Telephone Encounter (Signed)
Rxs printed/signed/faxed to pharmacy.

## 2016-03-09 NOTE — Telephone Encounter (Signed)
Rec'd fax stating pt is requesting an rx tobe written for Pantoprazole 40 mg. Medication is not available OTC...Mariah Farrell

## 2016-03-10 DIAGNOSIS — M47816 Spondylosis without myelopathy or radiculopathy, lumbar region: Secondary | ICD-10-CM | POA: Diagnosis not present

## 2016-03-11 ENCOUNTER — Telehealth: Payer: Self-pay | Admitting: Internal Medicine

## 2016-03-11 DIAGNOSIS — K7689 Other specified diseases of liver: Secondary | ICD-10-CM

## 2016-03-11 MED ORDER — PANTOPRAZOLE SODIUM 40 MG PO TBEC: 40.0000 mg | DELAYED_RELEASE_TABLET | Freq: Every day | ORAL | 1 refills | Status: DC

## 2016-03-11 NOTE — Addendum Note (Signed)
Addended by: Earnstine Regal on: 03/11/2016 11:45 AM   Modules accepted: Orders

## 2016-03-11 NOTE — Telephone Encounter (Signed)
I saw Pat's husband (Dr [place] message) -re: LS spine MRI with a large liver cyst.  I'll order liver US - pls inform the pt Thx

## 2016-03-12 NOTE — Telephone Encounter (Signed)
Left detailed mess informing pt of below.  

## 2016-04-07 ENCOUNTER — Ambulatory Visit
Admission: RE | Admit: 2016-04-07 | Discharge: 2016-04-07 | Disposition: A | Discharge status: Home / Self Care | Payer: Medicare Other | Source: Ambulatory Visit | Attending: Internal Medicine | Admitting: Internal Medicine

## 2016-04-07 DIAGNOSIS — K7689 Other specified diseases of liver: Secondary | ICD-10-CM

## 2016-04-08 DIAGNOSIS — M5136 Other intervertebral disc degeneration, lumbar region: Secondary | ICD-10-CM | POA: Diagnosis not present

## 2016-04-08 DIAGNOSIS — M791 Myalgia: Secondary | ICD-10-CM | POA: Diagnosis not present

## 2016-04-08 DIAGNOSIS — S39012A Strain of muscle, fascia and tendon of lower back, initial encounter: Secondary | ICD-10-CM | POA: Diagnosis not present

## 2016-04-13 ENCOUNTER — Telehealth: Payer: Self-pay | Admitting: *Deleted

## 2016-04-13 DIAGNOSIS — Q446 Cystic disease of liver: Secondary | ICD-10-CM

## 2016-04-13 NOTE — Telephone Encounter (Signed)
Pt return call back gave her MD response. Pt states yes she would like MRI of liver done...Johny Chess

## 2016-04-13 NOTE — Telephone Encounter (Signed)
-----   Message from Cassandria Anger, MD sent at 04/10/2016  5:26 PM EST ----- Mariah Farrell,  Please inform patient that I left her a VM re abd [place]. She has cysts on the liver, some are large. A radiologist recommended to do a liver MRI. Is it ok to schedule?  Please, mail the [place] to the patient. Thx

## 2016-04-14 NOTE — Telephone Encounter (Signed)
Will order Thx

## 2016-04-17 DIAGNOSIS — M791 Myalgia: Secondary | ICD-10-CM | POA: Diagnosis not present

## 2016-04-26 ENCOUNTER — Ambulatory Visit
Admission: RE | Admit: 2016-04-26 | Discharge: 2016-04-26 | Disposition: A | Discharge status: Home / Self Care | Payer: Medicare Other | Source: Ambulatory Visit | Attending: Internal Medicine | Admitting: Internal Medicine

## 2016-04-26 DIAGNOSIS — Q446 Cystic disease of liver: Secondary | ICD-10-CM

## 2016-04-26 DIAGNOSIS — K7689 Other specified diseases of liver: Secondary | ICD-10-CM | POA: Diagnosis not present

## 2016-04-26 MED ORDER — GADOBENATE DIMEGLUMINE 529 MG/ML IV SOLN: 9.0000 mL | Freq: Once | INTRAVENOUS | Status: DC | PRN

## 2016-05-06 DIAGNOSIS — M791 Myalgia: Secondary | ICD-10-CM | POA: Diagnosis not present

## 2016-05-15 DIAGNOSIS — M4310 Spondylolisthesis, site unspecified: Secondary | ICD-10-CM | POA: Diagnosis not present

## 2016-05-15 DIAGNOSIS — M5136 Other intervertebral disc degeneration, lumbar region: Secondary | ICD-10-CM | POA: Diagnosis not present

## 2016-05-16 ENCOUNTER — Encounter: Payer: Self-pay | Admitting: Family Medicine

## 2016-05-16 ENCOUNTER — Ambulatory Visit (HOSPITAL_COMMUNITY)
Admission: RE | Admit: 2016-05-16 | Discharge: 2016-05-16 | Disposition: A | Discharge status: Home / Self Care | Payer: Medicare Other | Source: Ambulatory Visit | Attending: Family Medicine | Admitting: Family Medicine

## 2016-05-16 ENCOUNTER — Ambulatory Visit (INDEPENDENT_AMBULATORY_CARE_PROVIDER_SITE_OTHER): Payer: Medicare Other | Admitting: Family Medicine

## 2016-05-16 VITALS — BP 122/80 | HR 83 | Temp 97.5°F | Resp 17 | Ht 61.5 in | Wt 197.8 lb

## 2016-05-16 DIAGNOSIS — K7689 Other specified diseases of liver: Secondary | ICD-10-CM | POA: Diagnosis not present

## 2016-05-16 DIAGNOSIS — Z9889 Other specified postprocedural states: Secondary | ICD-10-CM | POA: Diagnosis not present

## 2016-05-16 DIAGNOSIS — R1031 Right lower quadrant pain: Secondary | ICD-10-CM

## 2016-05-16 DIAGNOSIS — M47895 Other spondylosis, thoracolumbar region: Secondary | ICD-10-CM | POA: Diagnosis not present

## 2016-05-16 LAB — POCT I-STAT CREATININE: CREATININE: 1 mg/dL (ref 0.44–1.00)

## 2016-05-16 MED ORDER — IOPAMIDOL (ISOVUE-300) INJECTION 61%
30.0000 mL | Freq: Once | INTRAVENOUS | Status: AC | PRN
Start: 2016-05-16 — End: 2016-05-16
  Administered 2016-05-16: 30 mL via ORAL

## 2016-05-16 MED ORDER — IOPAMIDOL (ISOVUE-300) INJECTION 61%
100.0000 mL | Freq: Once | INTRAVENOUS | Status: AC | PRN
  Administered 2016-05-16: 100 mL via INTRAVENOUS

## 2016-05-16 NOTE — Patient Instructions (Signed)
I will call with your results.  Take care  Dr. Lacinda Axon

## 2016-05-16 NOTE — Addendum Note (Signed)
Addended by: Leeanne Rio on: 05/16/2016 10:41 AM   Modules accepted: Orders

## 2016-05-16 NOTE — Assessment & Plan Note (Signed)
New acute problem. Patient well-appearing. Mild tenderness to palpation of the right lower quadrant. Uncertain etiology/prognosis this time. Discussed watchful waiting versus imaging. Patient and husband elected for imaging today. Arranging CT.

## 2016-05-16 NOTE — Progress Notes (Signed)
Subjective:  Patient ID: Mariah Farrell, female    DOB: 12/06/1940  Age: 75 y.o. MRN: [id number]  CC: RLQ pain  HPI:  75 year old female with a history of chronic back pain presents with complaints of right lower quadrant pain.  Right lower quadrant pain  Patient reports she's had a three-day history of right lower quadrant pain.  Constant. Moderate in severity.  Associated "loose stool".  No nausea, vomiting, constipation.   No urinary tract symptoms.  She has never had this before.  No associated fevers or chills.  Normal PO intake.  Worse with palpation and ambulation.  No no relieving factors.  Social Hx   Social History   Social History  . Marital status: Married    Spouse name: N/A  . Number of children: N/A  . Years of education: N/A   Social History Main Topics  . Smoking status: Former Smoker    Packs/day: 0.25    Years: 5.00    Types: Cigarettes    Quit date: 06/01/1964  . Smokeless tobacco: Never Used  . Alcohol use Yes     Comment: occasional  . Drug use: No  . Sexual activity: Not Asked   Other Topics Concern  . None   Social History Narrative  . None   Review of Systems  Constitutional: Negative.   Gastrointestinal: Positive for abdominal pain.       "loose stools"  Musculoskeletal: Positive for back pain.   Objective:  BP 122/80   Pulse 83   Temp 97.5 F (36.4 C) (Oral)   Resp 17   Ht 5' 1.5" (1.562 m)   Wt 197 lb 12 oz (89.7 kg)   SpO2 96%   BMI 36.76 kg/m   BP/Weight 05/16/2016 03/04/2016 [id number]  Systolic BP [id number]  Diastolic BP 80 80 82  Wt. (Lbs) 197.75 198 195.6  BMI 36.76 36.81 36.98   Physical Exam  Constitutional: She is oriented to person, place, and time.  Well-appearing elderly female in no acute distress.  Cardiovascular: Normal rate and regular rhythm.   Pulmonary/Chest: Effort normal and breath sounds normal.  Abdominal:  Nondistended. Tender to palpation in the right lower quadrant. No  rebound or guarding.  Neurological: She is alert and oriented to person, place, and time.  Psychiatric: She has a normal mood and affect.  Vitals reviewed.  Lab Results  Component Value Date   WBC 4.6 03/04/2016   HGB 13.4 03/04/2016   HCT 38.9 03/04/2016   PLT 266.0 03/04/2016   GLUCOSE 88 03/04/2016   CHOL 194 03/04/2016   TRIG 165.0 (H) 03/04/2016   HDL 54.00 03/04/2016   LDLCALC 107 (H) 03/04/2016   ALT 21 03/04/2016   AST 23 03/04/2016   NA 142 03/04/2016   K 4.2 03/04/2016   CL 106 03/04/2016   CREATININE 0.94 03/04/2016   BUN 19 03/04/2016   CO2 29 03/04/2016   TSH 1.23 03/04/2016   INR 1.01 07/24/2013    Assessment & Plan:   Problem List Items Addressed This Visit    RLQ abdominal pain - Primary    New acute problem. Patient well-appearing. Mild tenderness to palpation of the right lower quadrant. Uncertain etiology/prognosis this time. Discussed watchful waiting versus imaging. Patient and husband elected for imaging today. Arranging CT.      Relevant Orders   CT Abdomen Pelvis W Contrast   Creatinine   BUN     Follow-up: [facility]  

## 2016-05-16 NOTE — Progress Notes (Signed)
Pre-visit discussion using our clinic review tool. No additional management support is needed unless otherwise documented below in the visit note.  

## 2016-06-08 DIAGNOSIS — H2513 Age-related nuclear cataract, bilateral: Secondary | ICD-10-CM | POA: Diagnosis not present

## 2016-06-08 DIAGNOSIS — H40013 Open angle with borderline findings, low risk, bilateral: Secondary | ICD-10-CM | POA: Diagnosis not present

## 2016-06-08 DIAGNOSIS — H52203 Unspecified astigmatism, bilateral: Secondary | ICD-10-CM | POA: Diagnosis not present

## 2016-06-08 DIAGNOSIS — H40053 Ocular hypertension, bilateral: Secondary | ICD-10-CM | POA: Diagnosis not present

## 2016-06-10 ENCOUNTER — Telehealth: Payer: Self-pay | Admitting: Internal Medicine

## 2016-06-10 NOTE — Telephone Encounter (Signed)
Pt called in and would like to know the status of her prolia injection .  She is going out of town for a month and is trying to get it before she leaves   Best number [phone]

## 2016-06-11 NOTE — Telephone Encounter (Signed)
Left message advising patient that I should have summary of benefits back by wed/thurs of next week (06/16/16), I will call patient back with those numbers and she can schedule appt to get prolia injections, or can call back with any questions, ok to talk with tamara

## 2016-07-09 DIAGNOSIS — L600 Ingrowing nail: Secondary | ICD-10-CM | POA: Diagnosis not present

## 2016-07-29 ENCOUNTER — Ambulatory Visit (INDEPENDENT_AMBULATORY_CARE_PROVIDER_SITE_OTHER): Payer: Medicare Other | Admitting: Internal Medicine

## 2016-07-29 ENCOUNTER — Telehealth: Payer: Self-pay

## 2016-07-29 ENCOUNTER — Encounter: Payer: Self-pay | Admitting: Internal Medicine

## 2016-07-29 DIAGNOSIS — R1031 Right lower quadrant pain: Secondary | ICD-10-CM

## 2016-07-29 DIAGNOSIS — M810 Age-related osteoporosis without current pathological fracture: Secondary | ICD-10-CM | POA: Diagnosis not present

## 2016-07-29 MED ORDER — GABAPENTIN 100 MG PO CAPS: 100.0000 mg | ORAL_CAPSULE | Freq: Two times a day (BID) | ORAL | 5 refills | Status: DC

## 2016-07-29 MED ORDER — LIDOCAINE 5 % EX PTCH: 1.0000 | MEDICATED_PATCH | CUTANEOUS | 3 refills | Status: DC

## 2016-07-29 NOTE — Progress Notes (Signed)
Pre visit review using our clinic review tool, if applicable. No additional management support is needed unless otherwise documented below in the visit note. 

## 2016-07-29 NOTE — Progress Notes (Signed)
Subjective:  Patient ID: Mariah Farrell, female    DOB: [birth year]  Age: 76 y.o. MRN: [id number]  CC: No chief complaint on file.   HPI LIEREN KILLEY presents for RLQ abd pain that starts in the rib cage area. Worse w/standing. Better in the supine position. A steroid taper helped temporarily. CT was ok.  Outpatient Medications Prior to Visit  Medication Sig Dispense Refill  . aspirin 81 MG tablet Take 81 mg by mouth daily.    . Biotin 5000 MCG TABS Take 1 tablet by mouth daily.    . calcium-vitamin D (OSCAL WITH D) 500-200 MG-UNIT per tablet Take 1 tablet by mouth daily.    . CELEBREX 200 MG capsule Take 1 capsule (200 mg total) by mouth daily. 200 capsule 5  . cetirizine (ZYRTEC) 10 MG tablet Take 10 mg by mouth daily.    . diazepam (VALIUM) 5 MG tablet TAKE 1/2-1 TABS BY MOUTH EVERY 12 HRS AS NEEDED FOR ANXIETY 60 tablet 3  . gabapentin (NEURONTIN) 300 MG capsule Take 1 capsule (300 mg total) by mouth 3 (three) times daily as needed. Back pain 90 capsule 5  . glucosamine-chondroitin 500-400 MG tablet Take 1 tablet by mouth daily.    [name] levothyroxine (SYNTHROID, LEVOTHROID) 25 MCG tablet TAKE ONE TABLET BY MOUTH ONE TIME DAILY BEFORE BREAKFAST 90 tablet 3  . magnesium oxide (MAG-OX) 400 MG tablet Take 400 mg by mouth daily.    . Multiple Vitamin (MULTIVITAMIN) tablet Take 1 tablet by mouth daily.    . pantoprazole (PROTONIX) 40 MG tablet Take 1 tablet (40 mg total) by mouth daily. 90 tablet 1  . timolol (TIMOPTIC) 0.5 % ophthalmic solution     . traMADol (ULTRAM) 50 MG tablet Take 1-2 tablets (50-100 mg total) by mouth every 8 (eight) hours as needed for moderate pain. 120 tablet 3  . vitamin C (ASCORBIC ACID) 500 MG tablet Take 1,000 mg by mouth daily.     . vitamin B-12 (CYANOCOBALAMIN) 250 MCG tablet Take 250 mcg by mouth daily.     No facility-administered medications prior to visit.     ROS Review of Systems  Constitutional: Negative for activity change, appetite  change, chills, fatigue and unexpected weight change.  HENT: Negative for congestion, mouth sores and sinus pressure.   Eyes: Negative for visual disturbance.  Respiratory: Negative for cough and chest tightness.   Gastrointestinal: Positive for abdominal pain. Negative for nausea.  Genitourinary: Negative for difficulty urinating, frequency and vaginal pain.  Musculoskeletal: Positive for arthralgias and back pain. Negative for gait problem.  Skin: Negative for pallor and rash.  Neurological: Negative for dizziness, tremors, weakness, numbness and headaches.  Psychiatric/Behavioral: Negative for confusion and sleep disturbance.    Objective:  BP 110/70   Pulse 79   Temp 98.2 F (36.8 C)   Wt 196 lb (88.9 kg)   SpO2 98%   BMI 36.43 kg/m   BP Readings from Last 3 Encounters:  07/29/16 110/70  05/16/16 122/80  03/04/16 120/80    Wt Readings from Last 3 Encounters:  07/29/16 196 lb (88.9 kg)  05/16/16 197 lb 12 oz (89.7 kg)  03/04/16 198 lb (89.8 kg)    Physical Exam  Constitutional: She appears well-developed. No distress.  HENT:  Head: Normocephalic.  Right Ear: External ear normal.  Left Ear: External ear normal.  Nose: Nose normal.  Mouth/Throat: Oropharynx is clear and moist.  Eyes: Conjunctivae are normal. Pupils are equal, round, and reactive to  light. Right eye exhibits no discharge. Left eye exhibits no discharge.  Neck: Normal range of motion. Neck supple. No JVD present. No tracheal deviation present. No thyromegaly present.  Cardiovascular: Normal rate, regular rhythm and normal heart sounds.   Pulmonary/Chest: No stridor. No respiratory distress. She has no wheezes.  Abdominal: Soft. Bowel sounds are normal. She exhibits no distension and no mass. There is tenderness. There is no rebound and no guarding.  Musculoskeletal: She exhibits no edema or tenderness.  Lymphadenopathy:    She has no cervical adenopathy.  Neurological: She displays normal reflexes. No  cranial nerve deficit. She exhibits normal muscle tone. Coordination normal.  Skin: No rash noted. No erythema.  Psychiatric: She has a normal mood and affect. Her behavior is normal. Judgment and thought content normal.  RLQ is sensitive; no mass or rebound No rash  Lab Results  Component Value Date   WBC 4.6 03/04/2016   HGB 13.4 03/04/2016   HCT 38.9 03/04/2016   PLT 266.0 03/04/2016   GLUCOSE 88 03/04/2016   CHOL 194 03/04/2016   TRIG 165.0 (H) 03/04/2016   HDL 54.00 03/04/2016   LDLCALC 107 (H) 03/04/2016   ALT 21 03/04/2016   AST 23 03/04/2016   NA 142 03/04/2016   K 4.2 03/04/2016   CL 106 03/04/2016   CREATININE 1.00 05/16/2016   BUN 19 03/04/2016   CO2 29 03/04/2016   TSH 1.23 03/04/2016   INR 1.01 07/24/2013    Ct Abdomen Pelvis W Contrast  Result Date: 05/16/2016 CLINICAL DATA:  Right lower quadrant pain for 3 days, no fever EXAM: CT ABDOMEN AND PELVIS WITH CONTRAST TECHNIQUE: Multidetector CT imaging of the abdomen and pelvis was performed using the standard protocol following bolus administration of intravenous contrast. CONTRAST:  54mL ISOVUE-300 IOPAMIDOL (ISOVUE-300) INJECTION 61%, 11mL ISOVUE-300 IOPAMIDOL (ISOVUE-300) INJECTION 61% COMPARISON:  04/26/2016 FINDINGS: Lower chest: Lung bases are normal. Hepatobiliary: Again noted multiple hepatic cysts. No calcified gallstones are noted within gallbladder. Pancreas: Enhanced pancreas is normal. Spleen: Enhanced spleen is normal. Adrenals/Urinary Tract: No adrenal gland mass. Enhanced kidneys are symmetrical in size. No hydronephrosis or hydroureter. Delayed renal images shows bilateral renal symmetrical excretion. Bilateral visualized proximal ureter is unremarkable. The urinary bladder is unremarkable. Stomach/Bowel: No small bowel obstruction. No thickened or dilated small bowel loops. The terminal ileum is unremarkable. Normal appendix. No pericecal inflammation. No distal colonic obstruction. No evidence of colitis  or diverticulitis. Vascular/Lymphatic: No aortic aneurysm.  No adenopathy. Reproductive: The uterus and ovaries are unremarkable. The uterus is atrophic. Other: There is no ascites or free abdominal air. Some colonic gas noted in transverse colon. Musculoskeletal: No destructive bony lesions are noted. Stable postsurgical changes at L4-L5 level. Multilevel degenerative changes thoracolumbar spine. Degenerative changes bilateral SI joints and pubic symphysis. IMPRESSION: 1. There is no evidence of acute inflammatory process within abdomen. 2. Stable multiple hepatic cysts. 3. No pericecal inflammation.  Normal appendix. 4. No hydronephrosis or hydroureter. 5. Degenerative changes thoracolumbar spine. Postsurgical changes L4-L5 level with alignment preserved. Electronically Signed   By: Lahoma Crocker M.D.   On: 05/16/2016 13:03    Assessment & Plan:   There are no diagnoses linked to this encounter. I am having Ms. Eulas Post maintain her cetirizine, multivitamin, vitamin C, calcium-vitamin D, glucosamine-chondroitin, magnesium oxide, Biotin, vitamin B-12, timolol, aspirin, gabapentin, CELEBREX, levothyroxine, diazepam, traMADol, and pantoprazole.  No orders of the defined types were placed in this encounter.    Follow-up: No Follow-up on file.  Walker Kehr, MD

## 2016-07-29 NOTE — Assessment & Plan Note (Addendum)
Likely a referred radiculopathy pain in the T12-L1-L2 dermatomes

## 2016-07-29 NOTE — Telephone Encounter (Signed)
Left message advising patient that prolia injection is due, can call to schedule nurse visit----summary of benefits states that patient has estimated $215 copay for injection with a $30 deductible still needing to be met---can talk with Redonna Wilbert if any questions

## 2016-07-29 NOTE — Assessment & Plan Note (Signed)
Prolia when approved

## 2016-07-29 NOTE — Patient Instructions (Addendum)
?  T12-L1 radiculitis McKenzie inflatable pillow Zostrix

## 2016-07-31 ENCOUNTER — Ambulatory Visit (INDEPENDENT_AMBULATORY_CARE_PROVIDER_SITE_OTHER): Payer: Medicare Other

## 2016-07-31 DIAGNOSIS — M810 Age-related osteoporosis without current pathological fracture: Secondary | ICD-10-CM | POA: Diagnosis not present

## 2016-07-31 MED ORDER — DENOSUMAB 60 MG/ML ~~LOC~~ SOLN
60.0000 mg | Freq: Once | SUBCUTANEOUS | Status: AC
  Administered 2016-07-31: 60 mg via SUBCUTANEOUS

## 2016-08-03 NOTE — Progress Notes (Signed)
Medical treatment/procedure(s) were performed by non-physician practitioner and as supervising physician I was immediately available for consultation/collaboration. I agree with above. Elizabeth A Crawford, MD  

## 2016-08-05 DIAGNOSIS — M961 Postlaminectomy syndrome, not elsewhere classified: Secondary | ICD-10-CM | POA: Diagnosis not present

## 2016-08-11 ENCOUNTER — Other Ambulatory Visit: Payer: Self-pay | Admitting: Internal Medicine

## 2016-08-12 DIAGNOSIS — M47816 Spondylosis without myelopathy or radiculopathy, lumbar region: Secondary | ICD-10-CM | POA: Diagnosis not present

## 2016-08-12 DIAGNOSIS — M5416 Radiculopathy, lumbar region: Secondary | ICD-10-CM | POA: Diagnosis not present

## 2016-08-19 ENCOUNTER — Other Ambulatory Visit: Payer: Self-pay | Admitting: Orthopaedic Surgery of the Spine

## 2016-08-19 DIAGNOSIS — M961 Postlaminectomy syndrome, not elsewhere classified: Secondary | ICD-10-CM

## 2016-08-24 DIAGNOSIS — Z01419 Encounter for gynecological examination (general) (routine) without abnormal findings: Secondary | ICD-10-CM | POA: Diagnosis not present

## 2016-09-01 ENCOUNTER — Ambulatory Visit
Admission: RE | Admit: 2016-09-01 | Discharge: 2016-09-01 | Disposition: A | Discharge status: Home / Self Care | Payer: Self-pay | Source: Ambulatory Visit | Attending: Orthopaedic Surgery of the Spine | Admitting: Orthopaedic Surgery of the Spine

## 2016-09-01 ENCOUNTER — Other Ambulatory Visit: Payer: Self-pay | Admitting: Orthopaedic Surgery of the Spine

## 2016-09-01 ENCOUNTER — Ambulatory Visit
Admission: RE | Admit: 2016-09-01 | Discharge: 2016-09-01 | Disposition: A | Discharge status: Home / Self Care | Payer: Medicare Other | Source: Ambulatory Visit | Attending: Orthopaedic Surgery of the Spine | Admitting: Orthopaedic Surgery of the Spine

## 2016-09-01 DIAGNOSIS — M961 Postlaminectomy syndrome, not elsewhere classified: Secondary | ICD-10-CM

## 2016-09-01 DIAGNOSIS — M48061 Spinal stenosis, lumbar region without neurogenic claudication: Secondary | ICD-10-CM | POA: Diagnosis not present

## 2016-09-01 MED ORDER — IOPAMIDOL (ISOVUE-M 200) INJECTION 41%
15.0000 mL | Freq: Once | INTRAMUSCULAR | Status: AC
  Administered 2016-09-01: 15 mL via INTRATHECAL

## 2016-09-01 MED ORDER — DIAZEPAM 5 MG PO TABS
5.0000 mg | ORAL_TABLET | Freq: Once | ORAL | Status: AC
  Administered 2016-09-01: 5 mg via ORAL

## 2016-09-01 NOTE — Discharge Instructions (Signed)
Myelogram Discharge Instructions  1. Go home and rest quietly for the next 24 hours.  It is important to lie flat for the next 24 hours.  Get up only to go to the restroom.  You may lie in the bed or on a couch on your back, your stomach, your left side or your right side.  You may have one pillow under your head.  You may have pillows between your knees while you are on your side or under your knees while you are on your back.  2. DO NOT drive today.  Recline the seat as far back as it will go, while still wearing your seat belt, on the way home.  3. You may get up to go to the bathroom as needed.  You may sit up for 10 minutes to eat.  You may resume your normal diet and medications unless otherwise indicated.  Drink lots of extra fluids today and tomorrow.  4. The incidence of headache, nausea, or vomiting is about 5% (one in 20 patients).  If you develop a headache, lie flat and drink plenty of fluids until the headache goes away.  Caffeinated beverages may be helpful.  If you develop severe nausea and vomiting or a headache that does not go away with flat bed rest, call [phone].  5. You may resume normal activities after your 24 hours of bed rest is over; however, do not exert yourself strongly or do any heavy lifting tomorrow. If when you get up you have a headache when standing, go back to bed and force fluids for another 24 hours.  6. Call your physician for a follow-up appointment.  The results of your myelogram will be sent directly to your physician by the following day.  7. If you have any questions or if complications develop after you arrive home, please call [phone].  Discharge instructions have been explained to the patient.  The patient, or the person responsible for the patient, fully understands these instructions.       May resume Tramadol on September 02, 2016, after 9:30 am.

## 2016-09-01 NOTE — Progress Notes (Signed)
Pt states she has not taken Tramadol for the past 2 days.

## 2016-09-07 DIAGNOSIS — C678 Malignant neoplasm of overlapping sites of bladder: Secondary | ICD-10-CM | POA: Diagnosis not present

## 2016-09-10 DIAGNOSIS — M5136 Other intervertebral disc degeneration, lumbar region: Secondary | ICD-10-CM | POA: Diagnosis not present

## 2016-10-06 ENCOUNTER — Encounter: Payer: Self-pay | Admitting: Family

## 2016-10-06 ENCOUNTER — Ambulatory Visit (INDEPENDENT_AMBULATORY_CARE_PROVIDER_SITE_OTHER): Payer: Medicare Other | Admitting: Family

## 2016-10-06 ENCOUNTER — Other Ambulatory Visit (INDEPENDENT_AMBULATORY_CARE_PROVIDER_SITE_OTHER): Payer: Medicare Other

## 2016-10-06 VITALS — BP 110/78 | HR 79 | Temp 97.8°F | Wt 198.0 lb

## 2016-10-06 DIAGNOSIS — D62 Acute posthemorrhagic anemia: Secondary | ICD-10-CM | POA: Diagnosis not present

## 2016-10-06 DIAGNOSIS — Z01818 Encounter for other preprocedural examination: Secondary | ICD-10-CM

## 2016-10-06 DIAGNOSIS — E040 Nontoxic diffuse goiter: Secondary | ICD-10-CM

## 2016-10-06 DIAGNOSIS — M545 Low back pain, unspecified: Secondary | ICD-10-CM

## 2016-10-06 LAB — COMPREHENSIVE METABOLIC PANEL
ALT: 22 U/L (ref 0–35)
AST: 24 U/L (ref 0–37)
Albumin: 4.2 g/dL (ref 3.5–5.2)
Alkaline Phosphatase: 48 U/L (ref 39–117)
BILIRUBIN TOTAL: 0.6 mg/dL (ref 0.2–1.2)
BUN: 23 mg/dL (ref 6–23)
CHLORIDE: 107 meq/L (ref 96–112)
CO2: 29 meq/L (ref 19–32)
CREATININE: 0.96 mg/dL (ref 0.40–1.20)
Calcium: 9.2 mg/dL (ref 8.4–10.5)
GFR: 59.77 mL/min — ABNORMAL LOW (ref >60.00)
GLUCOSE: 87 mg/dL (ref 70–99)
Potassium: 4.1 mEq/L (ref 3.5–5.1)
SODIUM: 141 meq/L (ref 135–145)
Total Protein: 6.7 g/dL (ref 6.0–8.3)

## 2016-10-06 LAB — CBC
HCT: 40 % (ref 36.0–46.0)
Hemoglobin: 13.5 g/dL (ref 12.0–15.0)
MCHC: 33.7 g/dL (ref 30.0–36.0)
MCV: 96.1 fl (ref 78.0–100.0)
Platelets: 285 10*3/uL (ref 150.0–400.0)
RBC: 4.17 Mil/uL (ref 3.87–5.11)
RDW: 13.9 % (ref 11.5–15.5)
WBC: 5.5 10*3/uL (ref 4.0–10.5)

## 2016-10-06 LAB — TSH: TSH: 0.85 u[IU]/mL (ref 0.35–4.50)

## 2016-10-06 LAB — PROTIME-INR
INR: 1.1 ratio — ABNORMAL HIGH (ref 0.8–1.0)
Prothrombin Time: 11.8 s (ref 9.6–13.1)

## 2016-10-06 NOTE — Patient Instructions (Addendum)
Thank you for choosing [facility].  SUMMARY AND INSTRUCTIONS:  Good luck with your surgery.  Keep taking the medications as prescribed.   We will contact Dr. Rayburn Felt office regarding your clearance.   Medication:  Your prescription(s) have been submitted to your pharmacy or been printed and provided for you. Please take as directed and contact our office if you believe you are having problem(s) with the medication(s) or have any questions.  Labs:  Please stop by the lab on the lower level of the building for your blood work. Your results will be released to [place] (or called to you) after review, usually within 72 hours after test completion. If any changes need to be made, you will be notified at that same time.  1.) The lab is open from 7:30am to 5:30 pm Monday-Friday 2.) No appointment is necessary 3.) Fasting (if needed) is 6-8 hours after food and drink; black coffee and water are okay   Follow up:  If your symptoms worsen or fail to improve, please contact our office for further instruction, or in case of emergency go directly to the emergency room at the closest medical facility.

## 2016-10-06 NOTE — Assessment & Plan Note (Signed)
Presents today for pre-operative exam for upcoming back surgery. Medications, allergies, previous medical history, surgical history and social history reviewed with no significant findings. Clinical exam completed with no significant findings. In office ECG obtained for surgical clearance and tracing independently reviewed showing sinus rhythm. Blood work obtain and clearance will be determined follow blood work results.

## 2016-10-06 NOTE — Assessment & Plan Note (Signed)
Lumbar back pain refractory to conservative treatment and scheduled to undergo 1 level lumbar surgery. Continue current mediations, icing regimen and home exercise therapy pending upcoming surgery.

## 2016-10-06 NOTE — Progress Notes (Signed)
Subjective:    Patient ID: Mariah Farrell, female    DOB: March 29, 1941, 76 y.o.   MRN: [id number]  Chief Complaint  Patient presents with  . [facility] should have sent paperwork to be completed and faxed back for patient's back surgery---surgery is next tuesday 5/15---surgeon is dr poletti---needs ekg today and labwork for surgery    HPI:  Mariah Farrell is a 76 y.o. female who  has a past medical history of Arthritis; Cancer ([place]) (13 years ago); GERD (gastroesophageal reflux disease); Goiter; H/O hiatal hernia; Headache(784.0); PONV (postoperative nausea and vomiting); and Seasonal allergies. and presents today for an office visit.  Continues to experience the associated symptom of pain located in her back that has been going on for about 9-10 months. Describes pain with sitting. Pain has been refractory to conservative treatments of injections and therapy. Scheduled to undergo a one level lumbar surgery on 5/15 by Dr. Syble Creek of [facility].   Allergies  Allergen Reactions  . Crab [Shellfish Allergy]   . Nexium [Esomeprazole Magnesium]     palpitations  . Penicillins       Outpatient Medications Prior to Visit  Medication Sig Dispense Refill  . aspirin 81 MG tablet Take 81 mg by mouth daily.    . Biotin 5000 MCG TABS Take 1 tablet by mouth daily.    . calcium-vitamin D (OSCAL WITH D) 500-200 MG-UNIT per tablet Take 1 tablet by mouth daily.    . CELEBREX 200 MG capsule Take 1 capsule (200 mg total) by mouth daily. 200 capsule 5  . cetirizine (ZYRTEC) 10 MG tablet Take 10 mg by mouth daily.    . diazepam (VALIUM) 5 MG tablet TAKE 1/2-1 TABS BY MOUTH EVERY 12 HRS AS NEEDED FOR ANXIETY 60 tablet 3  . gabapentin (NEURONTIN) 300 MG capsule Take 1 capsule (300 mg total) by mouth 3 (three) times daily as needed. Back pain 90 capsule 5  . glucosamine-chondroitin 500-400 MG tablet Take 1 tablet by mouth daily.    [name]  levothyroxine (SYNTHROID, LEVOTHROID) 25 MCG tablet TAKE ONE TABLET BY MOUTH ONE TIME DAILY BEFORE BREAKFAST 90 tablet 3  . magnesium oxide (MAG-OX) 400 MG tablet Take 400 mg by mouth daily.    . Multiple Vitamin (MULTIVITAMIN) tablet Take 1 tablet by mouth daily.    . pantoprazole (PROTONIX) 40 MG tablet TAKE 1 TABLET BY MOUTH DAILY 90 tablet 0  . timolol (TIMOPTIC) 0.5 % ophthalmic solution     . traMADol (ULTRAM) 50 MG tablet Take 1-2 tablets (50-100 mg total) by mouth every 8 (eight) hours as needed for moderate pain. 120 tablet 3  . vitamin C (ASCORBIC ACID) 500 MG tablet Take 1,000 mg by mouth daily.      No facility-administered medications prior to visit.       Past Surgical History:  Procedure Laterality Date  . ARTHRODESIS FOOT WITH WEIL OSTEOTOMY Right 07/26/2014   Procedure: RIGHT HALLUX METATARSOPHALANGEAL JOINT ARTHRODESIS; RIGHT SECOND METATARSAL WEIL OSTEOTOMY; DORSAL CAPSULOTOMY EXTENSOR TENDON LENGTHENING;  Surgeon: Wylene Simmer, MD;  Location: [place];  Service: Orthopedics;  Laterality: Right;  . BLADDER SURGERY  13 years ago  . BUNIONECTOMY Bilateral   . COLONOSCOPY    . LUMBAR FUSION  May 2014   L4-L5  . ROTATOR CUFF REPAIR Bilateral ~2010, 2012  . TONSILLECTOMY  as child  . TOTAL KNEE ARTHROPLASTY Right 07/19/2012   Procedure: TOTAL KNEE ARTHROPLASTY;  Surgeon:  Mauri Pole, MD;  Location: [facility];  Service: Orthopedics;  Laterality: Right;  . TOTAL KNEE ARTHROPLASTY Left 08/01/2013   Procedure: LEFT TOTAL KNEE ARTHROPLASTY;  Surgeon: Mauri Pole, MD;  Location: [facility];  Service: Orthopedics;  Laterality: Left;      Past Medical History:  Diagnosis Date  . Arthritis   . Cancer ([place]) 13 years ago   bladder  . GERD (gastroesophageal reflux disease)   . Goiter    synthroid used to shrink  . H/O hiatal hernia   . Headache(784.0)    migraines in morning  . PONV (postoperative nausea and vomiting)   . Seasonal allergies       Review  of Systems    Constitutional: Denies fever, chills, fatigue, or significant weight gain/loss. HENT: Head: Denies headache or neck pain Ears: Denies changes in hearing, ringing in ears, earache, drainage Nose: Denies discharge, stuffiness, itching, nosebleed, sinus pain Throat: Denies sore throat, hoarseness, dry mouth, sores, thrush Eyes: Denies loss/changes in vision, pain, redness, blurry/double vision, flashing lights Cardiovascular: Denies chest pain/discomfort, tightness, palpitations, shortness of breath with activity, difficulty lying down, swelling, sudden awakening with shortness of breath Respiratory: Denies shortness of breath, cough, sputum production, wheezing Gastrointestinal: Denies dysphasia, heartburn, change in appetite, nausea, change in bowel habits, rectal bleeding, constipation, diarrhea, yellow skin or eyes Genitourinary: Denies frequency, urgency, burning/pain, blood in urine, incontinence, change in urinary strength. Musculoskeletal: Denies muscle/joint pain, stiffness, back pain, redness or swelling of joints, trauma Skin: Denies rashes, lumps, itching, dryness, color changes, or hair/nail changes Neurological: Denies dizziness, fainting, seizures, weakness, numbness, tingling, tremor Psychiatric - Denies nervousness, stress, depression or memory loss Endocrine: Denies heat or cold intolerance, sweating, frequent urination, excessive thirst, changes in appetite Hematologic: Denies ease of bruising or bleeding Objective:    BP 110/78   Pulse 79   Temp 97.8 F (36.6 C)   Wt 198 lb (89.8 kg)   SpO2 100%   BMI 36.81 kg/m  Nursing note and vital signs reviewed.  Physical Exam  Constitutional: She is oriented to person, place, and time. She appears well-developed and well-nourished.  HENT:  Head: Normocephalic.  Right Ear: Hearing, tympanic membrane, external ear and ear canal normal.  Left Ear: Hearing, tympanic membrane, external ear and ear canal normal.    Nose: Nose normal.  Mouth/Throat: Uvula is midline, oropharynx is clear and moist and mucous membranes are normal.  Eyes: Conjunctivae and EOM are normal. Pupils are equal, round, and reactive to light.  Neck: Neck supple. No JVD present. No tracheal deviation present. Thyromegaly present.  Cardiovascular: Normal rate, regular rhythm, normal heart sounds and intact distal pulses.   Pulmonary/Chest: Effort normal and breath sounds normal.  Abdominal: Soft. Bowel sounds are normal. She exhibits no distension and no mass. There is no tenderness. There is no rebound and no guarding.  Musculoskeletal: Normal range of motion. She exhibits no edema or tenderness.  Lymphadenopathy:    She has no cervical adenopathy.  Neurological: She is alert and oriented to person, place, and time. She has normal reflexes. No cranial nerve deficit. She exhibits normal muscle tone. Coordination normal.  Skin: Skin is warm and dry.  Psychiatric: She has a normal mood and affect. Her behavior is normal. Judgment and thought content normal.       Assessment & Plan:   Problem List Items Addressed This Visit      Endocrine   Simple goiter   Relevant Orders   TSH (Completed)  Other   Lumbar back pain    Lumbar back pain refractory to conservative treatment and scheduled to undergo 1 level lumbar surgery. Continue current mediations, icing regimen and home exercise therapy pending upcoming surgery.       Relevant Orders   CBC (Completed)   Comprehensive metabolic panel (Completed)   Expected blood loss anemia   Relevant Orders   INR/PT (Completed)   Pre-operative examination for internal medicine - Primary    Presents today for pre-operative exam for upcoming back surgery. Medications, allergies, previous medical history, surgical history and social history reviewed with no significant findings. Clinical exam completed with no significant findings. In office ECG obtained for surgical clearance and tracing  independently reviewed showing sinus rhythm. Blood work obtain and clearance will be determined follow blood work results.       Relevant Orders   EKG 12-Lead (Completed)       I am having Ms. Eulas Post maintain her cetirizine, multivitamin, vitamin C, calcium-vitamin D, glucosamine-chondroitin, magnesium oxide, Biotin, timolol, aspirin, gabapentin, CELEBREX, levothyroxine, diazepam, traMADol, and pantoprazole.   Follow-up: Return if symptoms worsen or fail to improve.  Mauricio Po, FNP

## 2016-10-12 DIAGNOSIS — M961 Postlaminectomy syndrome, not elsewhere classified: Secondary | ICD-10-CM | POA: Diagnosis not present

## 2016-10-13 DIAGNOSIS — E039 Hypothyroidism, unspecified: Secondary | ICD-10-CM | POA: Diagnosis present

## 2016-10-13 DIAGNOSIS — M961 Postlaminectomy syndrome, not elsewhere classified: Secondary | ICD-10-CM | POA: Diagnosis present

## 2016-10-13 DIAGNOSIS — K219 Gastro-esophageal reflux disease without esophagitis: Secondary | ICD-10-CM | POA: Diagnosis present

## 2016-10-13 DIAGNOSIS — Z79899 Other long term (current) drug therapy: Secondary | ICD-10-CM | POA: Diagnosis not present

## 2016-10-13 DIAGNOSIS — M47896 Other spondylosis, lumbar region: Secondary | ICD-10-CM | POA: Diagnosis present

## 2016-10-13 DIAGNOSIS — Z96653 Presence of artificial knee joint, bilateral: Secondary | ICD-10-CM | POA: Diagnosis present

## 2016-10-13 DIAGNOSIS — I493 Ventricular premature depolarization: Secondary | ICD-10-CM | POA: Diagnosis present

## 2016-10-13 DIAGNOSIS — K449 Diaphragmatic hernia without obstruction or gangrene: Secondary | ICD-10-CM | POA: Diagnosis present

## 2016-10-13 DIAGNOSIS — R1013 Epigastric pain: Secondary | ICD-10-CM | POA: Diagnosis not present

## 2016-10-13 DIAGNOSIS — Z8551 Personal history of malignant neoplasm of bladder: Secondary | ICD-10-CM | POA: Diagnosis not present

## 2016-10-13 DIAGNOSIS — Z87891 Personal history of nicotine dependence: Secondary | ICD-10-CM | POA: Diagnosis not present

## 2016-10-13 DIAGNOSIS — M810 Age-related osteoporosis without current pathological fracture: Secondary | ICD-10-CM | POA: Diagnosis present

## 2016-10-13 DIAGNOSIS — M5416 Radiculopathy, lumbar region: Secondary | ICD-10-CM | POA: Diagnosis present

## 2016-10-13 DIAGNOSIS — Z7982 Long term (current) use of aspirin: Secondary | ICD-10-CM | POA: Diagnosis not present

## 2016-10-13 DIAGNOSIS — R112 Nausea with vomiting, unspecified: Secondary | ICD-10-CM | POA: Diagnosis not present

## 2016-10-13 DIAGNOSIS — M48062 Spinal stenosis, lumbar region with neurogenic claudication: Secondary | ICD-10-CM | POA: Diagnosis not present

## 2016-10-13 DIAGNOSIS — J302 Other seasonal allergic rhinitis: Secondary | ICD-10-CM | POA: Diagnosis present

## 2016-10-13 DIAGNOSIS — E049 Nontoxic goiter, unspecified: Secondary | ICD-10-CM | POA: Diagnosis present

## 2016-10-13 DIAGNOSIS — M4326 Fusion of spine, lumbar region: Secondary | ICD-10-CM | POA: Diagnosis not present

## 2016-11-03 DIAGNOSIS — M5030 Other cervical disc degeneration, unspecified cervical region: Secondary | ICD-10-CM | POA: Diagnosis not present

## 2016-11-13 DIAGNOSIS — M50321 Other cervical disc degeneration at C4-C5 level: Secondary | ICD-10-CM | POA: Diagnosis not present

## 2016-11-20 ENCOUNTER — Other Ambulatory Visit: Payer: Self-pay | Admitting: Internal Medicine

## 2016-12-15 ENCOUNTER — Other Ambulatory Visit: Payer: Self-pay | Admitting: Internal Medicine

## 2016-12-15 NOTE — Telephone Encounter (Signed)
Check [place] registry last filled on 08/27/16...Johny Chess

## 2016-12-16 ENCOUNTER — Telehealth: Payer: Self-pay | Admitting: Internal Medicine

## 2016-12-16 NOTE — Telephone Encounter (Signed)
Pt called in and is ready for her 2nd prolia. Is she ok to sch?     Best number [phone]

## 2016-12-17 NOTE — Telephone Encounter (Signed)
Patients last injection was 3/2---she can get next injection either on/after sept 3rd, 2018---left message advising patient of this and to let her know, I will reverify insurance for next injection and call her back to schedule a little closer to that time

## 2016-12-23 NOTE — Telephone Encounter (Signed)
Called refill into [facility] spoke w/Justin gave MD approval..../lmb

## 2016-12-28 DIAGNOSIS — Z1231 Encounter for screening mammogram for malignant neoplasm of breast: Secondary | ICD-10-CM | POA: Diagnosis not present

## 2016-12-28 LAB — HM MAMMOGRAPHY

## 2017-01-12 DIAGNOSIS — Z4789 Encounter for other orthopedic aftercare: Secondary | ICD-10-CM | POA: Diagnosis not present

## 2017-01-14 ENCOUNTER — Encounter: Payer: Self-pay | Admitting: Internal Medicine

## 2017-01-21 DIAGNOSIS — L820 Inflamed seborrheic keratosis: Secondary | ICD-10-CM | POA: Diagnosis not present

## 2017-01-21 DIAGNOSIS — L570 Actinic keratosis: Secondary | ICD-10-CM | POA: Diagnosis not present

## 2017-01-21 DIAGNOSIS — Z85828 Personal history of other malignant neoplasm of skin: Secondary | ICD-10-CM | POA: Diagnosis not present

## 2017-01-21 DIAGNOSIS — L720 Epidermal cyst: Secondary | ICD-10-CM | POA: Diagnosis not present

## 2017-02-17 ENCOUNTER — Other Ambulatory Visit: Payer: Self-pay | Admitting: Internal Medicine

## 2017-02-17 DIAGNOSIS — D1801 Hemangioma of skin and subcutaneous tissue: Secondary | ICD-10-CM | POA: Diagnosis not present

## 2017-02-17 DIAGNOSIS — D225 Melanocytic nevi of trunk: Secondary | ICD-10-CM | POA: Diagnosis not present

## 2017-02-17 DIAGNOSIS — L720 Epidermal cyst: Secondary | ICD-10-CM | POA: Diagnosis not present

## 2017-02-17 DIAGNOSIS — D2271 Melanocytic nevi of right lower limb, including hip: Secondary | ICD-10-CM | POA: Diagnosis not present

## 2017-02-17 DIAGNOSIS — D485 Neoplasm of uncertain behavior of skin: Secondary | ICD-10-CM | POA: Diagnosis not present

## 2017-02-17 DIAGNOSIS — D2261 Melanocytic nevi of right upper limb, including shoulder: Secondary | ICD-10-CM | POA: Diagnosis not present

## 2017-02-17 DIAGNOSIS — Z85828 Personal history of other malignant neoplasm of skin: Secondary | ICD-10-CM | POA: Diagnosis not present

## 2017-02-17 DIAGNOSIS — L821 Other seborrheic keratosis: Secondary | ICD-10-CM | POA: Diagnosis not present

## 2017-02-17 DIAGNOSIS — D2272 Melanocytic nevi of left lower limb, including hip: Secondary | ICD-10-CM | POA: Diagnosis not present

## 2017-02-17 DIAGNOSIS — L814 Other melanin hyperpigmentation: Secondary | ICD-10-CM | POA: Diagnosis not present

## 2017-02-22 ENCOUNTER — Other Ambulatory Visit: Payer: Self-pay | Admitting: Internal Medicine

## 2017-03-01 DIAGNOSIS — H5712 Ocular pain, left eye: Secondary | ICD-10-CM | POA: Diagnosis not present

## 2017-03-01 DIAGNOSIS — H04123 Dry eye syndrome of bilateral lacrimal glands: Secondary | ICD-10-CM | POA: Diagnosis not present

## 2017-03-08 ENCOUNTER — Telehealth: Payer: Self-pay

## 2017-03-08 NOTE — Telephone Encounter (Signed)
Patient advised that insurance has been verified for prolia, estimated $0 copay---patient has dental procedure she is checking on first before getting next injection---will call back towards end of month to let me know if she wants to get another prolia injection this year---can talk with tamara if any questions

## 2017-03-10 DIAGNOSIS — Z23 Encounter for immunization: Secondary | ICD-10-CM | POA: Diagnosis not present

## 2017-03-17 ENCOUNTER — Other Ambulatory Visit: Payer: Self-pay | Admitting: Internal Medicine

## 2017-03-18 DIAGNOSIS — M25572 Pain in left ankle and joints of left foot: Secondary | ICD-10-CM | POA: Diagnosis not present

## 2017-03-18 DIAGNOSIS — M19072 Primary osteoarthritis, left ankle and foot: Secondary | ICD-10-CM | POA: Diagnosis not present

## 2017-03-19 ENCOUNTER — Other Ambulatory Visit: Payer: Self-pay | Admitting: Internal Medicine

## 2017-03-24 ENCOUNTER — Encounter: Payer: Self-pay | Admitting: Internal Medicine

## 2017-03-24 ENCOUNTER — Other Ambulatory Visit (INDEPENDENT_AMBULATORY_CARE_PROVIDER_SITE_OTHER): Payer: Medicare Other

## 2017-03-24 ENCOUNTER — Ambulatory Visit (INDEPENDENT_AMBULATORY_CARE_PROVIDER_SITE_OTHER): Payer: Medicare Other | Admitting: Internal Medicine

## 2017-03-24 VITALS — BP 114/76 | HR 68 | Temp 98.1°F | Ht 62.0 in | Wt 199.0 lb

## 2017-03-24 DIAGNOSIS — E040 Nontoxic diffuse goiter: Secondary | ICD-10-CM

## 2017-03-24 DIAGNOSIS — F439 Reaction to severe stress, unspecified: Secondary | ICD-10-CM | POA: Insufficient documentation

## 2017-03-24 DIAGNOSIS — T458X5A Adverse effect of other primarily systemic and hematological agents, initial encounter: Secondary | ICD-10-CM | POA: Insufficient documentation

## 2017-03-24 DIAGNOSIS — M87180 Osteonecrosis due to drugs, jaw: Secondary | ICD-10-CM | POA: Diagnosis not present

## 2017-03-24 DIAGNOSIS — M545 Low back pain, unspecified: Secondary | ICD-10-CM

## 2017-03-24 DIAGNOSIS — M810 Age-related osteoporosis without current pathological fracture: Secondary | ICD-10-CM

## 2017-03-24 DIAGNOSIS — E785 Hyperlipidemia, unspecified: Secondary | ICD-10-CM | POA: Diagnosis not present

## 2017-03-24 LAB — CBC WITH DIFFERENTIAL/PLATELET
BASOS ABS: 0 10*3/uL (ref 0.0–0.1)
BASOS PCT: 0.5 % (ref 0.0–3.0)
Eosinophils Absolute: 0.3 10*3/uL (ref 0.0–0.7)
Eosinophils Relative: 4.7 % (ref 0.0–5.0)
HEMATOCRIT: 39.6 % (ref 36.0–46.0)
HEMOGLOBIN: 13.3 g/dL (ref 12.0–15.0)
LYMPHS PCT: 18.4 % (ref 12.0–46.0)
Lymphs Abs: 1.1 10*3/uL (ref 0.7–4.0)
MCHC: 33.5 g/dL (ref 30.0–36.0)
MCV: 97 fl (ref 78.0–100.0)
Monocytes Absolute: 0.6 10*3/uL (ref 0.1–1.0)
Monocytes Relative: 9.5 % (ref 3.0–12.0)
Neutro Abs: 3.9 10*3/uL (ref 1.4–7.7)
Neutrophils Relative %: 66.9 % (ref 43.0–77.0)
Platelets: 253 10*3/uL (ref 150.0–400.0)
RBC: 4.08 Mil/uL (ref 3.87–5.11)
RDW: 13.1 % (ref 11.5–15.5)
WBC: 5.9 10*3/uL (ref 4.0–10.5)

## 2017-03-24 LAB — BASIC METABOLIC PANEL
BUN: 21 mg/dL (ref 6–23)
CALCIUM: 9.1 mg/dL (ref 8.4–10.5)
CHLORIDE: 108 meq/L (ref 96–112)
CO2: 28 mEq/L (ref 19–32)
CREATININE: 1.02 mg/dL (ref 0.40–1.20)
GFR: 55.66 mL/min — ABNORMAL LOW (ref >60.00)
Glucose, Bld: 93 mg/dL (ref 70–99)
Potassium: 4.2 mEq/L (ref 3.5–5.1)
SODIUM: 144 meq/L (ref 135–145)

## 2017-03-24 LAB — LIPID PANEL
CHOL/HDL RATIO: 3
CHOLESTEROL: 161 mg/dL (ref 0–200)
HDL: 55.4 mg/dL (ref >39.00)
LDL CALC: 82 mg/dL (ref 0–99)
NonHDL: 106.09
TRIGLYCERIDES: 118 mg/dL (ref 0.0–149.0)
VLDL: 23.6 mg/dL (ref 0.0–40.0)

## 2017-03-24 LAB — URINALYSIS
Bilirubin Urine: NEGATIVE
Hgb urine dipstick: NEGATIVE
Ketones, ur: NEGATIVE
Leukocytes, UA: NEGATIVE
NITRITE: NEGATIVE
SPECIFIC GRAVITY, URINE: 1.025 (ref 1.000–1.030)
TOTAL PROTEIN, URINE-UPE24: NEGATIVE
URINE GLUCOSE: NEGATIVE
Urobilinogen, UA: 0.2 (ref 0.0–1.0)
pH: 5.5 (ref 5.0–8.0)

## 2017-03-24 LAB — HEPATIC FUNCTION PANEL
ALK PHOS: 45 U/L (ref 39–117)
ALT: 15 U/L (ref 0–35)
AST: 17 U/L (ref 0–37)
Albumin: 3.8 g/dL (ref 3.5–5.2)
BILIRUBIN DIRECT: 0.2 mg/dL (ref 0.0–0.3)
Total Bilirubin: 0.9 mg/dL (ref 0.2–1.2)
Total Protein: 6.3 g/dL (ref 6.0–8.3)

## 2017-03-24 LAB — TSH: TSH: 1.45 u[IU]/mL (ref 0.35–4.50)

## 2017-03-24 MED ORDER — VITAMIN D3 50 MCG (2000 UT) PO CAPS: 2000.0000 [IU] | ORAL_CAPSULE | Freq: Every day | ORAL | 3 refills | Status: AC

## 2017-03-24 MED ORDER — ZOSTER VAC RECOMB ADJUVANTED 50 MCG/0.5ML IM SUSR: 0.5000 mL | Freq: Once | INTRAMUSCULAR | 1 refills | Status: AC

## 2017-03-24 MED ORDER — CELEBREX 200 MG PO CAPS: 200.0000 mg | ORAL_CAPSULE | Freq: Every day | ORAL | 5 refills | Status: DC

## 2017-03-24 MED ORDER — PANTOPRAZOLE SODIUM 40 MG PO TBEC: 40.0000 mg | DELAYED_RELEASE_TABLET | Freq: Every day | ORAL | 3 refills | Status: DC

## 2017-03-24 MED ORDER — LEVOTHYROXINE SODIUM 25 MCG PO TABS: ORAL_TABLET | ORAL | 3 refills | Status: DC

## 2017-03-24 MED ORDER — DIAZEPAM 5 MG PO TABS: ORAL_TABLET | ORAL | 1 refills | Status: DC

## 2017-03-24 MED ORDER — GABAPENTIN 300 MG PO CAPS: 300.0000 mg | ORAL_CAPSULE | Freq: Three times a day (TID) | ORAL | 3 refills | Status: DC | PRN

## 2017-03-24 MED ORDER — CALCITONIN (SALMON) 200 UNIT/ACT NA SOLN: 1.0000 | Freq: Every day | NASAL | 12 refills | Status: DC

## 2017-03-24 MED ORDER — PANTOPRAZOLE SODIUM 40 MG PO TBEC
40.0000 mg | DELAYED_RELEASE_TABLET | Freq: Every day | ORAL | 3 refills | Status: DC
Start: 2017-03-24 — End: 2018-03-29

## 2017-03-24 NOTE — Assessment & Plan Note (Signed)
DPR  ON FILE Mariah Farrell became quadriplegic in 2016 - doing better; in a w/c

## 2017-03-24 NOTE — Assessment & Plan Note (Signed)
Labs

## 2017-03-24 NOTE — Assessment & Plan Note (Signed)
Better pst surgery

## 2017-03-24 NOTE — Progress Notes (Signed)
Subjective:  Patient ID: Mariah Farrell, female    DOB: Feb 16, 1941  Age: 76 y.o. MRN: [id number]  CC: No chief complaint on file.   HPI TYLIE GOLONKA presents for LBP, osteoporosis, insomnia f/u [name] had a bone abscess w/Prolia - jaw necrosis   Outpatient Medications Prior to Visit  Medication Sig Dispense Refill  . aspirin 81 MG tablet Take 81 mg by mouth daily.    . Biotin 5000 MCG TABS Take 1 tablet by mouth daily.    . calcium-vitamin D (OSCAL WITH D) 500-200 MG-UNIT per tablet Take 1 tablet by mouth daily.    . CELEBREX 200 MG capsule Take 1 capsule (200 mg total) by mouth daily. 200 capsule 5  . cetirizine (ZYRTEC) 10 MG tablet Take 10 mg by mouth daily.    . diazepam (VALIUM) 5 MG tablet TAKE 1/2-1 TABS BY MOUTH EVERY 12 HRS AS NEEDED FOR ANXIETY 60 tablet 3  . gabapentin (NEURONTIN) 300 MG capsule Take 1 capsule (300 mg total) by mouth 3 (three) times daily as needed. Back pain 90 capsule 5  . glucosamine-chondroitin 500-400 MG tablet Take 1 tablet by mouth daily.    [name] levothyroxine (SYNTHROID, LEVOTHROID) 25 MCG tablet TAKE ONE TABLET BY MOUTH ONE TIME DAILY BEFORE BREAKFAST 90 tablet 0  . magnesium oxide (MAG-OX) 400 MG tablet Take 400 mg by mouth daily.    . Multiple Vitamin (MULTIVITAMIN) tablet Take 1 tablet by mouth daily.    . pantoprazole (PROTONIX) 40 MG tablet TAKE 1 TABLET BY MOUTH DAILY 30 tablet 0  . timolol (TIMOPTIC) 0.5 % ophthalmic solution     . vitamin C (ASCORBIC ACID) 500 MG tablet Take 1,000 mg by mouth daily.     . traMADol (ULTRAM) 50 MG tablet Take 1-2 tablets (50-100 mg total) by mouth every 8 (eight) hours as needed for moderate pain. 120 tablet 3   No facility-administered medications prior to visit.     ROS Review of Systems  Constitutional: Negative for activity change, appetite change, chills, fatigue and unexpected weight change.  HENT: Negative for congestion, mouth sores and sinus pressure.   Eyes: Negative for visual disturbance.    Respiratory: Negative for cough and chest tightness.   Gastrointestinal: Negative for abdominal pain and nausea.  Genitourinary: Negative for difficulty urinating, frequency and vaginal pain.  Musculoskeletal: Positive for arthralgias and back pain. Negative for gait problem.  Skin: Negative for pallor and rash.  Neurological: Negative for dizziness, tremors, weakness, numbness and headaches.  Psychiatric/Behavioral: Negative for confusion and sleep disturbance.    Objective:  BP 114/76 (BP Location: Left Arm, Patient Position: Sitting, Cuff Size: Normal)   Pulse 68   Temp 98.1 F (36.7 C) (Oral)   Ht 5\' 2"  (1.575 m)   Wt 199 lb (90.3 kg)   SpO2 98%   BMI 36.40 kg/m   BP Readings from Last 3 Encounters:  03/24/17 114/76  10/06/16 110/78  09/01/16 113/62    Wt Readings from Last 3 Encounters:  03/24/17 199 lb (90.3 kg)  10/06/16 198 lb (89.8 kg)  07/29/16 196 lb (88.9 kg)    Physical Exam  Constitutional: She appears well-developed. No distress.  HENT:  Head: Normocephalic.  Right Ear: External ear normal.  Left Ear: External ear normal.  Nose: Nose normal.  Mouth/Throat: Oropharynx is clear and moist.  Eyes: Pupils are equal, round, and reactive to light. Conjunctivae are normal. Right eye exhibits no discharge. Left eye exhibits no discharge.  Neck: Normal  range of motion. Neck supple. No JVD present. No tracheal deviation present. No thyromegaly present.  Cardiovascular: Normal rate, regular rhythm and normal heart sounds.   Pulmonary/Chest: No stridor. No respiratory distress. She has no wheezes.  Abdominal: Soft. Bowel sounds are normal. She exhibits no distension and no mass. There is no tenderness. There is no rebound and no guarding.  Musculoskeletal: She exhibits tenderness. She exhibits no edema.  Lymphadenopathy:    She has no cervical adenopathy.  Neurological: She displays normal reflexes. No cranial nerve deficit. She exhibits normal muscle tone.  Coordination normal.  Skin: No rash noted. No erythema.  Psychiatric: She has a normal mood and affect. Her behavior is normal. Judgment and thought content normal.  LS tender w/ROM  Lab Results  Component Value Date   WBC 5.5 10/06/2016   HGB 13.5 10/06/2016   HCT 40.0 10/06/2016   PLT 285.0 10/06/2016   GLUCOSE 87 10/06/2016   CHOL 194 03/04/2016   TRIG 165.0 (H) 03/04/2016   HDL 54.00 03/04/2016   LDLCALC 107 (H) 03/04/2016   ALT 22 10/06/2016   AST 24 10/06/2016   NA 141 10/06/2016   K 4.1 10/06/2016   CL 107 10/06/2016   CREATININE 0.96 10/06/2016   BUN 23 10/06/2016   CO2 29 10/06/2016   TSH 0.85 10/06/2016   INR 1.1 (H) 10/06/2016    Ct Lumbar Spine W Contrast  Result Date: 09/01/2016 CLINICAL DATA:  Low back pain. RIGHT leg numbness. RIGHT groin pain. Previous lumbar fusion. EXAM: LUMBAR MYELOGRAM FLUOROSCOPY TIME:  dictate in minutes and seconds PROCEDURE: After thorough discussion of risks and benefits of the procedure including bleeding, infection, injury to nerves, blood vessels, adjacent structures as well as headache and CSF leak, written and oral informed consent was obtained. Consent was obtained by Dr. Rolla Flatten. Time out form was completed. Patient was positioned prone on the fluoroscopy table. Local anesthesia was provided with 1% lidocaine without epinephrine after prepped and draped in the usual sterile fashion. Puncture was performed at L3 using a 3 1/2 inch 22-gauge spinal needle via midline approach. Using a single pass through the dura, the needle was placed within the thecal sac, with return of clear CSF. 15 mL of Isovue-M 200 was injected into the thecal sac, with normal opacification of the nerve roots and cauda equina consistent with free flow within the subarachnoid space. I personally performed the lumbar puncture and administered the intrathecal contrast. I also personally supervised acquisition of the myelogram images. TECHNIQUE: Contiguous axial images  were obtained through the Lumbar spine after the intrathecal infusion of infusion. Coronal and sagittal reconstructions were obtained of the axial image sets. COMPARISON:  MRI lumbar spine 03/02/2016. FINDINGS: LUMBAR MYELOGRAM FINDINGS: Good opacification lumbar subarachnoid space. Previous interbody fusion at L4-5 with a single XLIF cage, augmented with unilateral pedicle screw construct at L4 and L5 on the RIGHT. An extradural defect is noted at L3-4 on the RIGHT, effacing the RIGHT L4 nerve root. There is mild stenosis at this level. At L1-2, there is an additional extradural defect, also on the RIGHT, with mild to moderate stenosis. RIGHT L2 nerve root impingement is likely. Mild to moderate stenosis is also present at L2-L3, without nerve root cut off. Advanced disc space narrowing is present at L1-2 and L3-4. Mild disc space narrowing is present at L2-3. Only slight disc space narrowing is present L5-S1. Solid appearing fusion at L4-5.  Hardware appears intact. With the patient prone for myelography, there is 1  mm anterolisthesis at L2-3, 3 mm anterolisthesis at L3-4, and 4 mm anterolisthesis at L5-S1. With patient standing, there is mild dynamic instability at multiple levels: At L2-3, there is 1 mm anterolisthesis in neutral, 2 mm anterolisthesis in extension, and 3 mm anterolisthesis in flexion. At L3-4, there is 4 mm anterolisthesis in neutral, 4 mm anterolisthesis in extension, and 5 mm anterolisthesis in flexion. At L5-S1, there is 5 mm anterolisthesis in neutral, 6 mm anterolisthesis in extension and 6 mm anterolisthesis in flexion. At L1-2, on the AP view, the extradural defect on the RIGHT is much more pronounced with patient standing. CT LUMBAR MYELOGRAM FINDINGS: Segmentation: Normal. Alignment:  See below. Vertebrae: No worrisome osseous lesion.Endplate sclerosis most pronounced at L1-L2. Conus medullaris: Normal in size and location. Paraspinal tissues: Unremarkable. No significant  atherosclerotic calcification. Hepatic cystic disease better seen on previous imaging. Disc levels: L1-L2: Central and rightward protrusion. Posterior element hypertrophy. Advanced disc space narrowing, with vacuum phenomenon and osseous spurring. Mild stenosis. RIGHT greater than LEFT L2 nerve root impingement. Significant foraminal narrowing likely affects the RIGHT L1 nerve root. L2-L3: 1 mm anterolisthesis. Posterior element hypertrophy. Mild disc space narrowing. Annular bulge. Mild stenosis without definite impingement. L3-L4: 2 mm anterolisthesis. Advanced posterior element hypertrophy. Central and rightward protrusion. Mild stenosis. RIGHT L4, and possibly RIGHT L3, nerve root impingement. L4-L5: Postsurgical change. Small laminotomy. RIGHT L4 and L5 pedicle screws appears solid. Interbody and posterior arthrodesis is established. No residual impingement. L5-S1: Advanced facet arthropathy. 1 mm anterolisthesis. Vacuum phenomenon. Central protrusion. No significant subarticular zone/S1 nerve root impingement. BILATERAL foraminal narrowing not clearly compressive. Compared to prior  lumbar MR, similar appearance. IMPRESSION: LUMBAR MYELOGRAM IMPRESSION: Solid L4-5 arthrodesis without motion. Mild dynamic instability at L3-4, where a central and rightward protrusion along with posterior element hypertrophy contributes to stenosis and RIGHT L4 nerve root impingement. Anterolisthesis increases from 5 to 6 mm with patient standing in flexion. RIGHT L1-2 extradural defect also consistent with disc protrusion. Nerve root cut off affecting the RIGHT L2 nerve root is most pronounced with patient standing as seen on the AP view. Mild dynamic instability at L5-S1 and L2-3 as described above. CT LUMBAR MYELOGRAM IMPRESSION: Solid L4-5 arthrodesis. Adjacent segment disease at L3-4 related to central and rightward protrusion, 2 mm slip with patient recumbent for CT, as well as posterior element hypertrophy. Mild stenosis  with RIGHT L4, possibly RIGHT L3 nerve root impingement. Central and rightward protrusion L1-2 also with posterior element hypertrophy. Advanced disc space narrowing. RIGHT L1 and L2 nerve root impingement likely. Advanced facet arthropathy L5-S1, mild anterolisthesis with central protrusion, but no definite impingement. Mild stenosis L2-L3. Electronically Signed   By: Staci Righter M.D.   On: 09/01/2016 12:44   Dg Myelography Lumbar Inj Lumbosacral  Result Date: 09/01/2016 CLINICAL DATA:  Low back pain. RIGHT leg numbness. RIGHT groin pain. Previous lumbar fusion. EXAM: LUMBAR MYELOGRAM FLUOROSCOPY TIME:  dictate in minutes and seconds PROCEDURE: After thorough discussion of risks and benefits of the procedure including bleeding, infection, injury to nerves, blood vessels, adjacent structures as well as headache and CSF leak, written and oral informed consent was obtained. Consent was obtained by Dr. Rolla Flatten. Time out form was completed. Patient was positioned prone on the fluoroscopy table. Local anesthesia was provided with 1% lidocaine without epinephrine after prepped and draped in the usual sterile fashion. Puncture was performed at L3 using a 3 1/2 inch 22-gauge spinal needle via midline approach. Using a single pass through the dura, the  needle was placed within the thecal sac, with return of clear CSF. 15 mL of Isovue-M 200 was injected into the thecal sac, with normal opacification of the nerve roots and cauda equina consistent with free flow within the subarachnoid space. I personally performed the lumbar puncture and administered the intrathecal contrast. I also personally supervised acquisition of the myelogram images. TECHNIQUE: Contiguous axial images were obtained through the Lumbar spine after the intrathecal infusion of infusion. Coronal and sagittal reconstructions were obtained of the axial image sets. COMPARISON:  MRI lumbar spine 03/02/2016. FINDINGS: LUMBAR MYELOGRAM FINDINGS: Good  opacification lumbar subarachnoid space. Previous interbody fusion at L4-5 with a single XLIF cage, augmented with unilateral pedicle screw construct at L4 and L5 on the RIGHT. An extradural defect is noted at L3-4 on the RIGHT, effacing the RIGHT L4 nerve root. There is mild stenosis at this level. At L1-2, there is an additional extradural defect, also on the RIGHT, with mild to moderate stenosis. RIGHT L2 nerve root impingement is likely. Mild to moderate stenosis is also present at L2-L3, without nerve root cut off. Advanced disc space narrowing is present at L1-2 and L3-4. Mild disc space narrowing is present at L2-3. Only slight disc space narrowing is present L5-S1. Solid appearing fusion at L4-5.  Hardware appears intact. With the patient prone for myelography, there is 1 mm anterolisthesis at L2-3, 3 mm anterolisthesis at L3-4, and 4 mm anterolisthesis at L5-S1. With patient standing, there is mild dynamic instability at multiple levels: At L2-3, there is 1 mm anterolisthesis in neutral, 2 mm anterolisthesis in extension, and 3 mm anterolisthesis in flexion. At L3-4, there is 4 mm anterolisthesis in neutral, 4 mm anterolisthesis in extension, and 5 mm anterolisthesis in flexion. At L5-S1, there is 5 mm anterolisthesis in neutral, 6 mm anterolisthesis in extension and 6 mm anterolisthesis in flexion. At L1-2, on the AP view, the extradural defect on the RIGHT is much more pronounced with patient standing. CT LUMBAR MYELOGRAM FINDINGS: Segmentation: Normal. Alignment:  See below. Vertebrae: No worrisome osseous lesion.Endplate sclerosis most pronounced at L1-L2. Conus medullaris: Normal in size and location. Paraspinal tissues: Unremarkable. No significant atherosclerotic calcification. Hepatic cystic disease better seen on previous imaging. Disc levels: L1-L2: Central and rightward protrusion. Posterior element hypertrophy. Advanced disc space narrowing, with vacuum phenomenon and osseous spurring. Mild  stenosis. RIGHT greater than LEFT L2 nerve root impingement. Significant foraminal narrowing likely affects the RIGHT L1 nerve root. L2-L3: 1 mm anterolisthesis. Posterior element hypertrophy. Mild disc space narrowing. Annular bulge. Mild stenosis without definite impingement. L3-L4: 2 mm anterolisthesis. Advanced posterior element hypertrophy. Central and rightward protrusion. Mild stenosis. RIGHT L4, and possibly RIGHT L3, nerve root impingement. L4-L5: Postsurgical change. Small laminotomy. RIGHT L4 and L5 pedicle screws appears solid. Interbody and posterior arthrodesis is established. No residual impingement. L5-S1: Advanced facet arthropathy. 1 mm anterolisthesis. Vacuum phenomenon. Central protrusion. No significant subarticular zone/S1 nerve root impingement. BILATERAL foraminal narrowing not clearly compressive. Compared to prior  lumbar MR, similar appearance. IMPRESSION: LUMBAR MYELOGRAM IMPRESSION: Solid L4-5 arthrodesis without motion. Mild dynamic instability at L3-4, where a central and rightward protrusion along with posterior element hypertrophy contributes to stenosis and RIGHT L4 nerve root impingement. Anterolisthesis increases from 5 to 6 mm with patient standing in flexion. RIGHT L1-2 extradural defect also consistent with disc protrusion. Nerve root cut off affecting the RIGHT L2 nerve root is most pronounced with patient standing as seen on the AP view. Mild dynamic instability at L5-S1 and L2-3 as  described above. CT LUMBAR MYELOGRAM IMPRESSION: Solid L4-5 arthrodesis. Adjacent segment disease at L3-4 related to central and rightward protrusion, 2 mm slip with patient recumbent for CT, as well as posterior element hypertrophy. Mild stenosis with RIGHT L4, possibly RIGHT L3 nerve root impingement. Central and rightward protrusion L1-2 also with posterior element hypertrophy. Advanced disc space narrowing. RIGHT L1 and L2 nerve root impingement likely. Advanced facet arthropathy L5-S1, mild  anterolisthesis with central protrusion, but no definite impingement. Mild stenosis L2-L3. Electronically Signed   By: Staci Righter M.D.   On: 09/01/2016 12:44    Assessment & Plan:   There are no diagnoses linked to this encounter. I have discontinued Ms. Krouse's traMADol. I am also having her maintain her cetirizine, multivitamin, vitamin C, calcium-vitamin D, glucosamine-chondroitin, magnesium oxide, Biotin, timolol, aspirin, gabapentin, CELEBREX, diazepam, levothyroxine, and pantoprazole.  No orders of the defined types were placed in this encounter.    Follow-up: No Follow-up on file.  Walker Kehr, MD

## 2017-03-24 NOTE — Assessment & Plan Note (Signed)
Try Miacalcin D/c Prolia

## 2017-03-24 NOTE — Assessment & Plan Note (Signed)
due to prolia - healed D/c Prolia

## 2017-03-26 ENCOUNTER — Other Ambulatory Visit: Payer: Self-pay | Admitting: Internal Medicine

## 2017-03-31 DIAGNOSIS — M19072 Primary osteoarthritis, left ankle and foot: Secondary | ICD-10-CM | POA: Diagnosis not present

## 2017-03-31 DIAGNOSIS — M2022 Hallux rigidus, left foot: Secondary | ICD-10-CM | POA: Diagnosis not present

## 2017-03-31 DIAGNOSIS — M21612 Bunion of left foot: Secondary | ICD-10-CM | POA: Diagnosis not present

## 2017-03-31 DIAGNOSIS — M2042 Other hammer toe(s) (acquired), left foot: Secondary | ICD-10-CM | POA: Diagnosis not present

## 2017-04-07 DIAGNOSIS — Z4789 Encounter for other orthopedic aftercare: Secondary | ICD-10-CM | POA: Diagnosis not present

## 2017-06-07 ENCOUNTER — Telehealth: Payer: Self-pay | Admitting: Internal Medicine

## 2017-06-07 MED ORDER — OSELTAMIVIR PHOSPHATE 75 MG PO CAPS: 75.0000 mg | ORAL_CAPSULE | Freq: Two times a day (BID) | ORAL | 0 refills | Status: DC

## 2017-06-07 NOTE — Telephone Encounter (Signed)
Done

## 2017-06-07 NOTE — Telephone Encounter (Signed)
Copied from [place]. Topic: Inquiry >> Jun 07, 2017  9:19 AM Patrice Paradise wrote: Reason for CRM: Patient husband called to see if Dr. Alain Marion could call in a Rx for Tamiflu for the the patient.   [facility] [address], [place] [place] [address] Phone: [phone] Fax: [phone]

## 2017-06-14 ENCOUNTER — Telehealth: Payer: Self-pay

## 2017-06-14 NOTE — Telephone Encounter (Signed)
Left message advising patient that prolia injection is due---I have verified insurance for 2019 and patient has estimated $0 copay---ok to schedule nurse visit at patient's earliest convenience---can talk with Jonelle Sidle, RN at [facility] office if any further questions

## 2017-06-14 NOTE — Telephone Encounter (Signed)
Copied from [place] [id number]. Topic: Quick Communication - Office Called Patient >> Jun 14, 2017  3:41 PM Ander Slade, RN wrote: Reason for CRM: prolia injection due >> Jun 14, 2017  4:05 PM Percell Belt A wrote: Pt called and said that DR plot has recommended  Her to not get anymore porlia injections.  She just wanted to let Irene Shipper know to take her off the list    Patient will be archived from prolia portal for now---if patient changes her mind --we can place patient back in portal and restart injections

## 2017-06-15 DIAGNOSIS — M961 Postlaminectomy syndrome, not elsewhere classified: Secondary | ICD-10-CM | POA: Insufficient documentation

## 2017-06-21 DIAGNOSIS — M5136 Other intervertebral disc degeneration, lumbar region: Secondary | ICD-10-CM | POA: Diagnosis not present

## 2017-06-21 DIAGNOSIS — M5416 Radiculopathy, lumbar region: Secondary | ICD-10-CM | POA: Diagnosis not present

## 2017-06-21 DIAGNOSIS — M961 Postlaminectomy syndrome, not elsewhere classified: Secondary | ICD-10-CM | POA: Diagnosis not present

## 2017-06-24 DIAGNOSIS — M5416 Radiculopathy, lumbar region: Secondary | ICD-10-CM | POA: Diagnosis not present

## 2017-07-27 DIAGNOSIS — H40053 Ocular hypertension, bilateral: Secondary | ICD-10-CM | POA: Diagnosis not present

## 2017-07-27 DIAGNOSIS — H2513 Age-related nuclear cataract, bilateral: Secondary | ICD-10-CM | POA: Diagnosis not present

## 2017-07-27 DIAGNOSIS — H52203 Unspecified astigmatism, bilateral: Secondary | ICD-10-CM | POA: Diagnosis not present

## 2017-08-09 ENCOUNTER — Telehealth: Payer: Self-pay | Admitting: Internal Medicine

## 2017-08-09 NOTE — Telephone Encounter (Signed)
MD is out of the office this week. Pt will need to make an appt w/ another provider first to get a referral. Pls call pt back inform will need to be seen for referral../lmb

## 2017-08-09 NOTE — Telephone Encounter (Signed)
Copied from [place]. Topic: Referral - Request >> Aug 09, 2017 11:32 AM Burnis Medin, NT wrote: Reason for CRM: Patient called and said she found a lump in her breast and she wants a referral to get a unilateral diagnostic mammogram with possible breast ultra sound at [facility] on [address].

## 2017-08-09 NOTE — Telephone Encounter (Signed)
Noted  

## 2017-08-09 NOTE — Telephone Encounter (Signed)
Appointment scheduled with Dr Sharlet Salina (08/10/2016).  Just FYI.

## 2017-08-10 ENCOUNTER — Ambulatory Visit (INDEPENDENT_AMBULATORY_CARE_PROVIDER_SITE_OTHER): Payer: Medicare Other | Admitting: Internal Medicine

## 2017-08-10 ENCOUNTER — Encounter: Payer: Self-pay | Admitting: Internal Medicine

## 2017-08-10 VITALS — BP 118/72 | HR 81 | Temp 98.0°F | Ht 62.0 in | Wt 203.0 lb

## 2017-08-10 DIAGNOSIS — N6310 Unspecified lump in the right breast, unspecified quadrant: Secondary | ICD-10-CM

## 2017-08-10 NOTE — Assessment & Plan Note (Signed)
Suspect benign etiology. Checking [place] and diagnostic mammogram right for evaluation. Left breast normal.

## 2017-08-10 NOTE — Progress Notes (Signed)
   Subjective:    Patient ID: Mariah Farrell, female    DOB: Feb 19, 1941, 77 y.o.   MRN: [id number]  HPI The patient is a 77 YO female coming in for right breast lump. Noticed about 2 weeks ago and was very tender to begin with. She does have a history of cysts. She has noticed it shrinking since that time. She denies much pain today from it. She denies swelling in that area. No nipple discharge. Denies weight change.   Review of Systems  Constitutional: Negative.   Respiratory: Negative for cough, chest tightness and shortness of breath.   Cardiovascular: Negative for chest pain, palpitations and leg swelling.  Gastrointestinal: Negative for abdominal distention, abdominal pain, constipation, diarrhea, nausea and vomiting.  Genitourinary:       Breast lump  Musculoskeletal: Negative.   Skin: Negative.   Neurological: Negative.       Objective:   Physical Exam  Constitutional: She is oriented to person, place, and time. She appears well-developed and well-nourished.  HENT:  Head: Normocephalic and atraumatic.  Eyes: EOM are normal.  Neck: Normal range of motion.  Cardiovascular: Normal rate and regular rhythm.  Pulmonary/Chest: Effort normal and breath sounds normal. No respiratory distress. She has no wheezes. She has no rales.  Bilateral breast exam done, left breast normal no LAD, right breast with small <1cm ruddy lesion about 9 o'clock without pain, no regional LAD  Musculoskeletal: She exhibits no edema.  Neurological: She is alert and oriented to person, place, and time. Coordination normal.  Skin: Skin is warm and dry.   Vitals:   08/10/17 1501  BP: 118/72  Pulse: 81  Temp: 98 F (36.7 C)  TempSrc: Oral  SpO2: 95%  Weight: 203 lb (92.1 kg)  Height: 5\' 2"  (1.575 m)      Assessment & Plan:

## 2017-08-10 NOTE — Patient Instructions (Signed)
We will get the mammogram to be safe.

## 2017-08-16 DIAGNOSIS — N6331 Unspecified lump in axillary tail of the right breast: Secondary | ICD-10-CM | POA: Diagnosis not present

## 2017-08-25 DIAGNOSIS — Z01419 Encounter for gynecological examination (general) (routine) without abnormal findings: Secondary | ICD-10-CM | POA: Diagnosis not present

## 2017-09-19 ENCOUNTER — Other Ambulatory Visit: Payer: Self-pay | Admitting: Neurology

## 2017-09-28 ENCOUNTER — Other Ambulatory Visit: Payer: Self-pay | Admitting: Internal Medicine

## 2017-09-29 DIAGNOSIS — C678 Malignant neoplasm of overlapping sites of bladder: Secondary | ICD-10-CM | POA: Diagnosis not present

## 2018-01-26 ENCOUNTER — Encounter: Payer: Self-pay | Admitting: Internal Medicine

## 2018-01-26 DIAGNOSIS — Z1231 Encounter for screening mammogram for malignant neoplasm of breast: Secondary | ICD-10-CM | POA: Diagnosis not present

## 2018-02-22 DIAGNOSIS — Z23 Encounter for immunization: Secondary | ICD-10-CM | POA: Diagnosis not present

## 2018-02-24 ENCOUNTER — Other Ambulatory Visit: Payer: Self-pay

## 2018-02-24 MED ORDER — DIAZEPAM 5 MG PO TABS: ORAL_TABLET | ORAL | 0 refills | Status: DC

## 2018-02-24 NOTE — Telephone Encounter (Signed)
Please advise about refill in Dr. Enis Slipper absence. Patient has appt next month

## 2018-02-24 NOTE — Telephone Encounter (Signed)
Done erx 

## 2018-03-09 DIAGNOSIS — D2272 Melanocytic nevi of left lower limb, including hip: Secondary | ICD-10-CM | POA: Diagnosis not present

## 2018-03-09 DIAGNOSIS — D1722 Benign lipomatous neoplasm of skin and subcutaneous tissue of left arm: Secondary | ICD-10-CM | POA: Diagnosis not present

## 2018-03-09 DIAGNOSIS — D2271 Melanocytic nevi of right lower limb, including hip: Secondary | ICD-10-CM | POA: Diagnosis not present

## 2018-03-09 DIAGNOSIS — D1801 Hemangioma of skin and subcutaneous tissue: Secondary | ICD-10-CM | POA: Diagnosis not present

## 2018-03-09 DIAGNOSIS — Z85828 Personal history of other malignant neoplasm of skin: Secondary | ICD-10-CM | POA: Diagnosis not present

## 2018-03-09 DIAGNOSIS — D225 Melanocytic nevi of trunk: Secondary | ICD-10-CM | POA: Diagnosis not present

## 2018-03-09 DIAGNOSIS — L814 Other melanin hyperpigmentation: Secondary | ICD-10-CM | POA: Diagnosis not present

## 2018-03-09 DIAGNOSIS — L821 Other seborrheic keratosis: Secondary | ICD-10-CM | POA: Diagnosis not present

## 2018-03-09 DIAGNOSIS — L820 Inflamed seborrheic keratosis: Secondary | ICD-10-CM | POA: Diagnosis not present

## 2018-03-22 ENCOUNTER — Other Ambulatory Visit: Payer: Self-pay | Admitting: Internal Medicine

## 2018-03-29 ENCOUNTER — Encounter: Payer: Self-pay | Admitting: Internal Medicine

## 2018-03-29 ENCOUNTER — Ambulatory Visit (INDEPENDENT_AMBULATORY_CARE_PROVIDER_SITE_OTHER): Payer: Medicare Other | Admitting: Internal Medicine

## 2018-03-29 ENCOUNTER — Other Ambulatory Visit (INDEPENDENT_AMBULATORY_CARE_PROVIDER_SITE_OTHER): Payer: Medicare Other

## 2018-03-29 VITALS — BP 122/78 | HR 69 | Temp 98.2°F | Ht 62.0 in | Wt 188.0 lb

## 2018-03-29 DIAGNOSIS — M545 Low back pain, unspecified: Secondary | ICD-10-CM

## 2018-03-29 DIAGNOSIS — M810 Age-related osteoporosis without current pathological fracture: Secondary | ICD-10-CM | POA: Diagnosis not present

## 2018-03-29 DIAGNOSIS — E049 Nontoxic goiter, unspecified: Secondary | ICD-10-CM | POA: Diagnosis not present

## 2018-03-29 DIAGNOSIS — D62 Acute posthemorrhagic anemia: Secondary | ICD-10-CM

## 2018-03-29 DIAGNOSIS — E785 Hyperlipidemia, unspecified: Secondary | ICD-10-CM | POA: Diagnosis not present

## 2018-03-29 LAB — CBC WITH DIFFERENTIAL/PLATELET
BASOS PCT: 1.2 % (ref 0.0–3.0)
Basophils Absolute: 0.1 10*3/uL (ref 0.0–0.1)
EOS ABS: 0.3 10*3/uL (ref 0.0–0.7)
Eosinophils Relative: 5.2 % — ABNORMAL HIGH (ref 0.0–5.0)
HEMATOCRIT: 40.9 % (ref 36.0–46.0)
Hemoglobin: 13.8 g/dL (ref 12.0–15.0)
Lymphocytes Relative: 23.9 % (ref 12.0–46.0)
Lymphs Abs: 1.3 10*3/uL (ref 0.7–4.0)
MCHC: 33.8 g/dL (ref 30.0–36.0)
MCV: 95.9 fl (ref 78.0–100.0)
MONO ABS: 0.6 10*3/uL (ref 0.1–1.0)
Monocytes Relative: 11.2 % (ref 3.0–12.0)
NEUTROS ABS: 3.3 10*3/uL (ref 1.4–7.7)
Neutrophils Relative %: 58.5 % (ref 43.0–77.0)
PLATELETS: 247 10*3/uL (ref 150.0–400.0)
RBC: 4.27 Mil/uL (ref 3.87–5.11)
RDW: 13.5 % (ref 11.5–15.5)
WBC: 5.6 10*3/uL (ref 4.0–10.5)

## 2018-03-29 LAB — URINALYSIS, ROUTINE W REFLEX MICROSCOPIC
Bilirubin Urine: NEGATIVE
Hgb urine dipstick: NEGATIVE
Ketones, ur: NEGATIVE
Nitrite: NEGATIVE
RBC / HPF: NONE SEEN (ref 0–?)
SPECIFIC GRAVITY, URINE: 1.01 (ref 1.000–1.030)
Total Protein, Urine: NEGATIVE
URINE GLUCOSE: NEGATIVE
Urobilinogen, UA: 0.2 (ref 0.0–1.0)
pH: 5.5 (ref 5.0–8.0)

## 2018-03-29 LAB — HEPATIC FUNCTION PANEL
ALK PHOS: 58 U/L (ref 39–117)
ALT: 15 U/L (ref 0–35)
AST: 20 U/L (ref 0–37)
Albumin: 4.1 g/dL (ref 3.5–5.2)
BILIRUBIN DIRECT: 0.1 mg/dL (ref 0.0–0.3)
Total Bilirubin: 0.8 mg/dL (ref 0.2–1.2)
Total Protein: 6.6 g/dL (ref 6.0–8.3)

## 2018-03-29 LAB — LIPID PANEL
Cholesterol: 170 mg/dL (ref 0–200)
HDL: 56.8 mg/dL (ref >39.00)
LDL Cholesterol: 96 mg/dL (ref 0–99)
NonHDL: 113.66
TRIGLYCERIDES: 89 mg/dL (ref 0.0–149.0)
Total CHOL/HDL Ratio: 3
VLDL: 17.8 mg/dL (ref 0.0–40.0)

## 2018-03-29 LAB — TSH: TSH: 1.26 u[IU]/mL (ref 0.35–4.50)

## 2018-03-29 LAB — BASIC METABOLIC PANEL
BUN: 17 mg/dL (ref 6–23)
CHLORIDE: 105 meq/L (ref 96–112)
CO2: 28 mEq/L (ref 19–32)
Calcium: 9.5 mg/dL (ref 8.4–10.5)
Creatinine, Ser: 1 mg/dL (ref 0.40–1.20)
GFR: 56.8 mL/min — ABNORMAL LOW (ref >60.00)
Glucose, Bld: 85 mg/dL (ref 70–99)
POTASSIUM: 4 meq/L (ref 3.5–5.1)
Sodium: 141 mEq/L (ref 135–145)

## 2018-03-29 MED ORDER — DIAZEPAM 5 MG PO TABS: ORAL_TABLET | ORAL | 1 refills | Status: DC

## 2018-03-29 MED ORDER — CALCITONIN (SALMON) 200 UNIT/ACT NA SOLN: 1.0000 | Freq: Every day | NASAL | 12 refills | Status: DC

## 2018-03-29 MED ORDER — LEVOTHYROXINE SODIUM 25 MCG PO TABS: ORAL_TABLET | ORAL | 3 refills | Status: DC

## 2018-03-29 MED ORDER — CELEBREX 200 MG PO CAPS: 200.0000 mg | ORAL_CAPSULE | Freq: Every day | ORAL | 5 refills | Status: DC

## 2018-03-29 MED ORDER — PANTOPRAZOLE SODIUM 40 MG PO TBEC: 40.0000 mg | DELAYED_RELEASE_TABLET | Freq: Every day | ORAL | 3 refills | Status: DC

## 2018-03-29 MED ORDER — GABAPENTIN 300 MG PO CAPS: 300.0000 mg | ORAL_CAPSULE | Freq: Every day | ORAL | 3 refills | Status: DC

## 2018-03-29 NOTE — Assessment & Plan Note (Signed)
Celebrex qd Tramadol, Gabapentin prn

## 2018-03-29 NOTE — Assessment & Plan Note (Signed)
TSH On Levothroid 

## 2018-03-29 NOTE — Assessment & Plan Note (Signed)
CBC

## 2018-03-29 NOTE — Progress Notes (Signed)
Subjective:  Patient ID: Mariah Farrell, female    DOB: [birth year]  Age: 77 y.o. MRN: [id number]  CC: No chief complaint on file.   HPI Shakeyla Giebler presents for LBP, osteoporosis, OA  Outpatient Medications Prior to Visit  Medication Sig Dispense Refill  . aspirin 81 MG tablet Take 81 mg by mouth daily.    . Biotin 5000 MCG TABS Take 1 tablet by mouth daily.    . calcitonin, salmon, (MIACALCIN) 200 UNIT/ACT nasal spray Place 1 spray into alternate nostrils daily. 3.7 mL 12  . calcium-vitamin D (OSCAL WITH D) 500-200 MG-UNIT per tablet Take 1 tablet by mouth daily.    . CELEBREX 200 MG capsule Take 1 capsule (200 mg total) by mouth daily. 200 capsule 5  . cetirizine (ZYRTEC) 10 MG tablet Take 10 mg by mouth daily.    . Cholecalciferol (VITAMIN D3) 2000 units capsule Take 1 capsule (2,000 Units total) by mouth daily. 100 capsule 3  . diazepam (VALIUM) 5 MG tablet TAKE 1/2 TO 1 TABLET BY MOUTH EVERY 12 HOURS AS NEEDED FOR ANXIETY 60 tablet 0  . gabapentin (NEURONTIN) 300 MG capsule Take 1 capsule (300 mg total) by mouth 3 (three) times daily as needed. Back pain 270 capsule 3  . gabapentin (NEURONTIN) 300 MG capsule TAKE ONE CAPSULE BY MOUTH 3 TIMES A DAY AS NEEDED FOR BACK PAIN 90 capsule 1  . glucosamine-chondroitin 500-400 MG tablet Take 1 tablet by mouth daily.    [name] levothyroxine (SYNTHROID, LEVOTHROID) 25 MCG tablet TAKE 1 TABLET BY MOUTH EVERY DAY BEFORE BREAKFAST 30 tablet 0  . magnesium oxide (MAG-OX) 400 MG tablet Take 400 mg by mouth daily.    . Multiple Vitamin (MULTIVITAMIN) tablet Take 1 tablet by mouth daily.    . pantoprazole (PROTONIX) 40 MG tablet Take 1 tablet (40 mg total) by mouth daily. 90 tablet 3  . timolol (TIMOPTIC) 0.5 % ophthalmic solution     . vitamin C (ASCORBIC ACID) 500 MG tablet Take 1,000 mg by mouth daily.      No facility-administered medications prior to visit.     ROS: Review of Systems  Constitutional: Negative for activity  change, appetite change, chills, fatigue and unexpected weight change.  HENT: Negative for congestion, mouth sores and sinus pressure.   Eyes: Negative for visual disturbance.  Respiratory: Negative for cough and chest tightness.   Gastrointestinal: Negative for abdominal pain and nausea.  Genitourinary: Negative for difficulty urinating, frequency and vaginal pain.  Musculoskeletal: Positive for arthralgias and back pain. Negative for gait problem.  Skin: Negative for pallor and rash.  Neurological: Negative for dizziness, tremors, weakness, numbness and headaches.  Psychiatric/Behavioral: Negative for confusion, sleep disturbance and suicidal ideas.    Objective:  There were no vitals taken for this visit.  BP Readings from Last 3 Encounters:  08/10/17 118/72  03/24/17 114/76  10/06/16 110/78    Wt Readings from Last 3 Encounters:  08/10/17 203 lb (92.1 kg)  03/24/17 199 lb (90.3 kg)  10/06/16 198 lb (89.8 kg)    Physical Exam  Constitutional: She appears well-developed. No distress.  HENT:  Head: Normocephalic.  Right Ear: External ear normal.  Left Ear: External ear normal.  Nose: Nose normal.  Mouth/Throat: Oropharynx is clear and moist.  Eyes: Pupils are equal, round, and reactive to light. Conjunctivae are normal. Right eye exhibits no discharge. Left eye exhibits no discharge.  Neck: Normal range of motion. Neck supple. No JVD present. No tracheal  deviation present. No thyromegaly present.  Cardiovascular: Normal rate, regular rhythm and normal heart sounds.  Pulmonary/Chest: No stridor. No respiratory distress. She has no wheezes.  Abdominal: Soft. Bowel sounds are normal. She exhibits no distension and no mass. There is no tenderness. There is no rebound and no guarding.  Musculoskeletal: She exhibits tenderness. She exhibits no edema.  Lymphadenopathy:    She has no cervical adenopathy.  Neurological: She displays normal reflexes. No cranial nerve deficit. She  exhibits normal muscle tone. Coordination abnormal.  Skin: No rash noted. No erythema.  Psychiatric: She has a normal mood and affect. Her behavior is normal. Judgment and thought content normal.  LS, neck - tender  Lab Results  Component Value Date   WBC 5.9 03/24/2017   HGB 13.3 03/24/2017   HCT 39.6 03/24/2017   PLT 253.0 03/24/2017   GLUCOSE 93 03/24/2017   CHOL 161 03/24/2017   TRIG 118.0 03/24/2017   HDL 55.40 03/24/2017   LDLCALC 82 03/24/2017   ALT 15 03/24/2017   AST 17 03/24/2017   NA 144 03/24/2017   K 4.2 03/24/2017   CL 108 03/24/2017   CREATININE 1.02 03/24/2017   BUN 21 03/24/2017   CO2 28 03/24/2017   TSH 1.45 03/24/2017   INR 1.1 (H) 10/06/2016    Ct Lumbar Spine W Contrast  Result Date: 09/01/2016 CLINICAL DATA:  Low back pain. RIGHT leg numbness. RIGHT groin pain. Previous lumbar fusion. EXAM: LUMBAR MYELOGRAM FLUOROSCOPY TIME:  dictate in minutes and seconds PROCEDURE: After thorough discussion of risks and benefits of the procedure including bleeding, infection, injury to nerves, blood vessels, adjacent structures as well as headache and CSF leak, written and oral informed consent was obtained. Consent was obtained by Dr. Rolla Flatten. Time out form was completed. Patient was positioned prone on the fluoroscopy table. Local anesthesia was provided with 1% lidocaine without epinephrine after prepped and draped in the usual sterile fashion. Puncture was performed at L3 using a 3 1/2 inch 22-gauge spinal needle via midline approach. Using a single pass through the dura, the needle was placed within the thecal sac, with return of clear CSF. 15 mL of Isovue-M 200 was injected into the thecal sac, with normal opacification of the nerve roots and cauda equina consistent with free flow within the subarachnoid space. I personally performed the lumbar puncture and administered the intrathecal contrast. I also personally supervised acquisition of the myelogram images. TECHNIQUE:  Contiguous axial images were obtained through the Lumbar spine after the intrathecal infusion of infusion. Coronal and sagittal reconstructions were obtained of the axial image sets. COMPARISON:  MRI lumbar spine 03/02/2016. FINDINGS: LUMBAR MYELOGRAM FINDINGS: Good opacification lumbar subarachnoid space. Previous interbody fusion at L4-5 with a single XLIF cage, augmented with unilateral pedicle screw construct at L4 and L5 on the RIGHT. An extradural defect is noted at L3-4 on the RIGHT, effacing the RIGHT L4 nerve root. There is mild stenosis at this level. At L1-2, there is an additional extradural defect, also on the RIGHT, with mild to moderate stenosis. RIGHT L2 nerve root impingement is likely. Mild to moderate stenosis is also present at L2-L3, without nerve root cut off. Advanced disc space narrowing is present at L1-2 and L3-4. Mild disc space narrowing is present at L2-3. Only slight disc space narrowing is present L5-S1. Solid appearing fusion at L4-5.  Hardware appears intact. With the patient prone for myelography, there is 1 mm anterolisthesis at L2-3, 3 mm anterolisthesis at L3-4, and 4 mm  anterolisthesis at L5-S1. With patient standing, there is mild dynamic instability at multiple levels: At L2-3, there is 1 mm anterolisthesis in neutral, 2 mm anterolisthesis in extension, and 3 mm anterolisthesis in flexion. At L3-4, there is 4 mm anterolisthesis in neutral, 4 mm anterolisthesis in extension, and 5 mm anterolisthesis in flexion. At L5-S1, there is 5 mm anterolisthesis in neutral, 6 mm anterolisthesis in extension and 6 mm anterolisthesis in flexion. At L1-2, on the AP view, the extradural defect on the RIGHT is much more pronounced with patient standing. CT LUMBAR MYELOGRAM FINDINGS: Segmentation: Normal. Alignment:  See below. Vertebrae: No worrisome osseous lesion.Endplate sclerosis most pronounced at L1-L2. Conus medullaris: Normal in size and location. Paraspinal tissues: Unremarkable. No  significant atherosclerotic calcification. Hepatic cystic disease better seen on previous imaging. Disc levels: L1-L2: Central and rightward protrusion. Posterior element hypertrophy. Advanced disc space narrowing, with vacuum phenomenon and osseous spurring. Mild stenosis. RIGHT greater than LEFT L2 nerve root impingement. Significant foraminal narrowing likely affects the RIGHT L1 nerve root. L2-L3: 1 mm anterolisthesis. Posterior element hypertrophy. Mild disc space narrowing. Annular bulge. Mild stenosis without definite impingement. L3-L4: 2 mm anterolisthesis. Advanced posterior element hypertrophy. Central and rightward protrusion. Mild stenosis. RIGHT L4, and possibly RIGHT L3, nerve root impingement. L4-L5: Postsurgical change. Small laminotomy. RIGHT L4 and L5 pedicle screws appears solid. Interbody and posterior arthrodesis is established. No residual impingement. L5-S1: Advanced facet arthropathy. 1 mm anterolisthesis. Vacuum phenomenon. Central protrusion. No significant subarticular zone/S1 nerve root impingement. BILATERAL foraminal narrowing not clearly compressive. Compared to prior  lumbar MR, similar appearance. IMPRESSION: LUMBAR MYELOGRAM IMPRESSION: Solid L4-5 arthrodesis without motion. Mild dynamic instability at L3-4, where a central and rightward protrusion along with posterior element hypertrophy contributes to stenosis and RIGHT L4 nerve root impingement. Anterolisthesis increases from 5 to 6 mm with patient standing in flexion. RIGHT L1-2 extradural defect also consistent with disc protrusion. Nerve root cut off affecting the RIGHT L2 nerve root is most pronounced with patient standing as seen on the AP view. Mild dynamic instability at L5-S1 and L2-3 as described above. CT LUMBAR MYELOGRAM IMPRESSION: Solid L4-5 arthrodesis. Adjacent segment disease at L3-4 related to central and rightward protrusion, 2 mm slip with patient recumbent for CT, as well as posterior element hypertrophy.  Mild stenosis with RIGHT L4, possibly RIGHT L3 nerve root impingement. Central and rightward protrusion L1-2 also with posterior element hypertrophy. Advanced disc space narrowing. RIGHT L1 and L2 nerve root impingement likely. Advanced facet arthropathy L5-S1, mild anterolisthesis with central protrusion, but no definite impingement. Mild stenosis L2-L3. Electronically Signed   By: Staci Righter M.D.   On: 09/01/2016 12:44   Dg Myelography Lumbar Inj Lumbosacral  Result Date: 09/01/2016 CLINICAL DATA:  Low back pain. RIGHT leg numbness. RIGHT groin pain. Previous lumbar fusion. EXAM: LUMBAR MYELOGRAM FLUOROSCOPY TIME:  dictate in minutes and seconds PROCEDURE: After thorough discussion of risks and benefits of the procedure including bleeding, infection, injury to nerves, blood vessels, adjacent structures as well as headache and CSF leak, written and oral informed consent was obtained. Consent was obtained by Dr. Rolla Flatten. Time out form was completed. Patient was positioned prone on the fluoroscopy table. Local anesthesia was provided with 1% lidocaine without epinephrine after prepped and draped in the usual sterile fashion. Puncture was performed at L3 using a 3 1/2 inch 22-gauge spinal needle via midline approach. Using a single pass through the dura, the needle was placed within the thecal sac, with return of clear CSF.  15 mL of Isovue-M 200 was injected into the thecal sac, with normal opacification of the nerve roots and cauda equina consistent with free flow within the subarachnoid space. I personally performed the lumbar puncture and administered the intrathecal contrast. I also personally supervised acquisition of the myelogram images. TECHNIQUE: Contiguous axial images were obtained through the Lumbar spine after the intrathecal infusion of infusion. Coronal and sagittal reconstructions were obtained of the axial image sets. COMPARISON:  MRI lumbar spine 03/02/2016. FINDINGS: LUMBAR MYELOGRAM  FINDINGS: Good opacification lumbar subarachnoid space. Previous interbody fusion at L4-5 with a single XLIF cage, augmented with unilateral pedicle screw construct at L4 and L5 on the RIGHT. An extradural defect is noted at L3-4 on the RIGHT, effacing the RIGHT L4 nerve root. There is mild stenosis at this level. At L1-2, there is an additional extradural defect, also on the RIGHT, with mild to moderate stenosis. RIGHT L2 nerve root impingement is likely. Mild to moderate stenosis is also present at L2-L3, without nerve root cut off. Advanced disc space narrowing is present at L1-2 and L3-4. Mild disc space narrowing is present at L2-3. Only slight disc space narrowing is present L5-S1. Solid appearing fusion at L4-5.  Hardware appears intact. With the patient prone for myelography, there is 1 mm anterolisthesis at L2-3, 3 mm anterolisthesis at L3-4, and 4 mm anterolisthesis at L5-S1. With patient standing, there is mild dynamic instability at multiple levels: At L2-3, there is 1 mm anterolisthesis in neutral, 2 mm anterolisthesis in extension, and 3 mm anterolisthesis in flexion. At L3-4, there is 4 mm anterolisthesis in neutral, 4 mm anterolisthesis in extension, and 5 mm anterolisthesis in flexion. At L5-S1, there is 5 mm anterolisthesis in neutral, 6 mm anterolisthesis in extension and 6 mm anterolisthesis in flexion. At L1-2, on the AP view, the extradural defect on the RIGHT is much more pronounced with patient standing. CT LUMBAR MYELOGRAM FINDINGS: Segmentation: Normal. Alignment:  See below. Vertebrae: No worrisome osseous lesion.Endplate sclerosis most pronounced at L1-L2. Conus medullaris: Normal in size and location. Paraspinal tissues: Unremarkable. No significant atherosclerotic calcification. Hepatic cystic disease better seen on previous imaging. Disc levels: L1-L2: Central and rightward protrusion. Posterior element hypertrophy. Advanced disc space narrowing, with vacuum phenomenon and osseous  spurring. Mild stenosis. RIGHT greater than LEFT L2 nerve root impingement. Significant foraminal narrowing likely affects the RIGHT L1 nerve root. L2-L3: 1 mm anterolisthesis. Posterior element hypertrophy. Mild disc space narrowing. Annular bulge. Mild stenosis without definite impingement. L3-L4: 2 mm anterolisthesis. Advanced posterior element hypertrophy. Central and rightward protrusion. Mild stenosis. RIGHT L4, and possibly RIGHT L3, nerve root impingement. L4-L5: Postsurgical change. Small laminotomy. RIGHT L4 and L5 pedicle screws appears solid. Interbody and posterior arthrodesis is established. No residual impingement. L5-S1: Advanced facet arthropathy. 1 mm anterolisthesis. Vacuum phenomenon. Central protrusion. No significant subarticular zone/S1 nerve root impingement. BILATERAL foraminal narrowing not clearly compressive. Compared to prior  lumbar MR, similar appearance. IMPRESSION: LUMBAR MYELOGRAM IMPRESSION: Solid L4-5 arthrodesis without motion. Mild dynamic instability at L3-4, where a central and rightward protrusion along with posterior element hypertrophy contributes to stenosis and RIGHT L4 nerve root impingement. Anterolisthesis increases from 5 to 6 mm with patient standing in flexion. RIGHT L1-2 extradural defect also consistent with disc protrusion. Nerve root cut off affecting the RIGHT L2 nerve root is most pronounced with patient standing as seen on the AP view. Mild dynamic instability at L5-S1 and L2-3 as described above. CT LUMBAR MYELOGRAM IMPRESSION: Solid L4-5 arthrodesis. Adjacent segment disease  at L3-4 related to central and rightward protrusion, 2 mm slip with patient recumbent for CT, as well as posterior element hypertrophy. Mild stenosis with RIGHT L4, possibly RIGHT L3 nerve root impingement. Central and rightward protrusion L1-2 also with posterior element hypertrophy. Advanced disc space narrowing. RIGHT L1 and L2 nerve root impingement likely. Advanced facet  arthropathy L5-S1, mild anterolisthesis with central protrusion, but no definite impingement. Mild stenosis L2-L3. Electronically Signed   By: Staci Righter M.D.   On: 09/01/2016 12:44    Assessment & Plan:   There are no diagnoses linked to this encounter.   No orders of the defined types were placed in this encounter.    Follow-up: No follow-ups on file.  Walker Kehr, MD

## 2018-03-29 NOTE — Patient Instructions (Signed)

## 2018-04-08 DIAGNOSIS — F319 Bipolar disorder, unspecified: Secondary | ICD-10-CM | POA: Insufficient documentation

## 2018-04-19 ENCOUNTER — Inpatient Hospital Stay: Admission: RE | Admit: 2018-04-19 | Payer: Medicare Other | Source: Ambulatory Visit

## 2018-04-25 ENCOUNTER — Inpatient Hospital Stay: Admission: RE | Admit: 2018-04-25 | Payer: Medicare Other | Source: Ambulatory Visit

## 2018-04-25 ENCOUNTER — Other Ambulatory Visit: Payer: Self-pay | Admitting: Internal Medicine

## 2018-05-17 ENCOUNTER — Telehealth: Payer: Self-pay | Admitting: Internal Medicine

## 2018-05-17 MED ORDER — CELEBREX 200 MG PO CAPS: 200.0000 mg | ORAL_CAPSULE | Freq: Every day | ORAL | 5 refills | Status: DC

## 2018-05-17 NOTE — Telephone Encounter (Signed)
Copied from [place] [phone]. Topic: Quick Communication - Rx Refill/Question >> May 17, 2018  9:19 AM Virl Axe D wrote: Medication: CELEBREX 200 MG capsule / Pt's husband stated Pharmacy stated they have been trying to contact office for refill with no result. Needs to be faxed to [email or web address] not [facility]  Has the patient contacted their pharmacy? Yes.   (Agent: If no, request that the patient contact the pharmacy for the refill.) (Agent: If yes, when and what did the pharmacy advise?)  Preferred Pharmacy (with phone number or street name): Fax Rx to [email or web address] @ [phone]  Agent: Please be advised that RX refills may take up to 3 business days. We ask that you follow-up with your pharmacy.

## 2018-05-17 NOTE — Telephone Encounter (Signed)
Printed RX to be faxed

## 2018-05-17 NOTE — Telephone Encounter (Signed)
See request for Celebrex order to be faxed. Thanks.

## 2018-05-19 ENCOUNTER — Ambulatory Visit (INDEPENDENT_AMBULATORY_CARE_PROVIDER_SITE_OTHER)
Admission: RE | Admit: 2018-05-19 | Discharge: 2018-05-19 | Disposition: A | Discharge status: Home / Self Care | Payer: Medicare Other | Source: Ambulatory Visit | Attending: Internal Medicine | Admitting: Internal Medicine

## 2018-05-19 DIAGNOSIS — E785 Hyperlipidemia, unspecified: Secondary | ICD-10-CM

## 2018-06-04 ENCOUNTER — Other Ambulatory Visit: Payer: Self-pay | Admitting: Internal Medicine

## 2018-06-09 ENCOUNTER — Telehealth: Payer: Self-pay | Admitting: Internal Medicine

## 2018-06-09 NOTE — Telephone Encounter (Signed)
Results mailed 

## 2018-06-09 NOTE — Telephone Encounter (Signed)
Pt called back to get results written by Dr Alain Marion on 05/23/18. Pt is requesting a paper copy of the results sent to her.

## 2018-06-14 DIAGNOSIS — Z981 Arthrodesis status: Secondary | ICD-10-CM | POA: Insufficient documentation

## 2018-06-17 DIAGNOSIS — F209 Schizophrenia, unspecified: Secondary | ICD-10-CM | POA: Insufficient documentation

## 2018-06-17 DIAGNOSIS — F03918 Unspecified dementia, unspecified severity, with other behavioral disturbance: Secondary | ICD-10-CM | POA: Insufficient documentation

## 2018-06-18 DIAGNOSIS — E43 Unspecified severe protein-calorie malnutrition: Secondary | ICD-10-CM | POA: Insufficient documentation

## 2018-06-18 DIAGNOSIS — E039 Hypothyroidism, unspecified: Secondary | ICD-10-CM | POA: Insufficient documentation

## 2018-06-18 DIAGNOSIS — K219 Gastro-esophageal reflux disease without esophagitis: Secondary | ICD-10-CM | POA: Insufficient documentation

## 2018-06-18 DIAGNOSIS — I10 Essential (primary) hypertension: Secondary | ICD-10-CM | POA: Insufficient documentation

## 2018-06-18 DIAGNOSIS — E782 Mixed hyperlipidemia: Secondary | ICD-10-CM | POA: Insufficient documentation

## 2018-07-17 DIAGNOSIS — E559 Vitamin D deficiency, unspecified: Secondary | ICD-10-CM | POA: Insufficient documentation

## 2018-08-04 DIAGNOSIS — M47816 Spondylosis without myelopathy or radiculopathy, lumbar region: Secondary | ICD-10-CM | POA: Insufficient documentation

## 2018-12-05 ENCOUNTER — Other Ambulatory Visit: Payer: Self-pay | Admitting: Internal Medicine

## 2018-12-05 NOTE — Telephone Encounter (Signed)
[place] Controlled Database Checked Last filled: 06/27/18 # 90 LOV w/you: 03/29/18 Next appt w/you: None

## 2018-12-07 ENCOUNTER — Ambulatory Visit: Payer: Medicare Other | Admitting: Internal Medicine

## 2018-12-09 ENCOUNTER — Ambulatory Visit: Payer: Medicare Other | Admitting: Cardiology

## 2018-12-09 ENCOUNTER — Other Ambulatory Visit: Payer: Self-pay | Admitting: Internal Medicine

## 2019-02-01 LAB — HM MAMMOGRAPHY

## 2019-02-02 ENCOUNTER — Encounter: Payer: Self-pay | Admitting: Internal Medicine

## 2019-02-27 DIAGNOSIS — M9904 Segmental and somatic dysfunction of sacral region: Secondary | ICD-10-CM | POA: Insufficient documentation

## 2019-03-05 ENCOUNTER — Other Ambulatory Visit: Payer: Self-pay | Admitting: Internal Medicine

## 2019-03-18 IMAGING — CT CT L SPINE W/ CM
1 of 6 series · 5 of 14 positions shown, 7 images · non-contrast
Comparison: MRI lumbar spine 03/02/2016.

CLINICAL DATA: Low back pain. RIGHT leg numbness. RIGHT groin pain.
Previous lumbar fusion.
TECHNIQUE: Contiguous axial images were obtained through the Lumbar spine after
the intrathecal infusion of infusion. Coronal and sagittal
reconstructions were obtained of the axial image sets.

[Series 2: l spine soft (person_name) · axial · 0.27mm/px · z∈[-274,-118]mm · 5 of 79 slices shown, 7 images]
[im 14/79  soft-tissue]
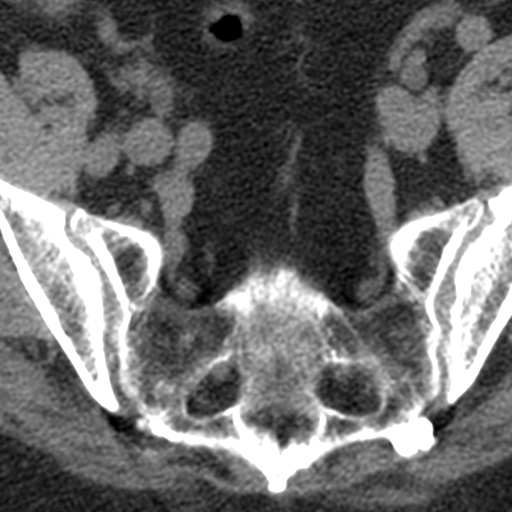
[im 14/79  bone]
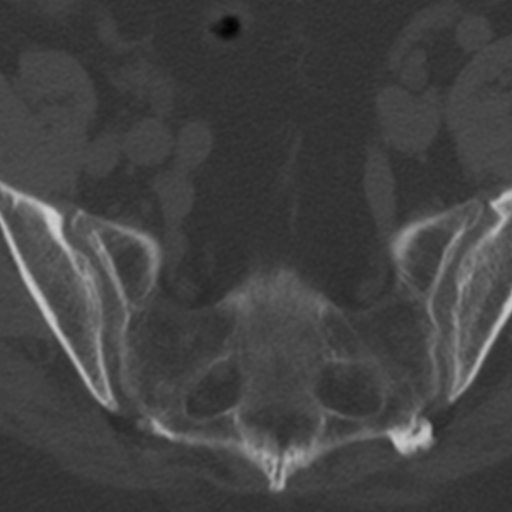
[im 27/79  bone]
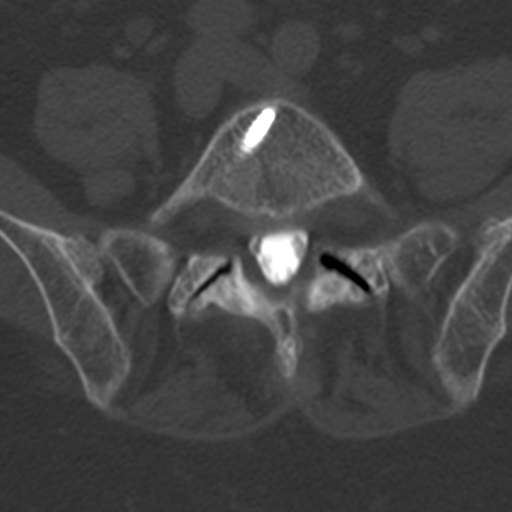
[im 40/79  bone]
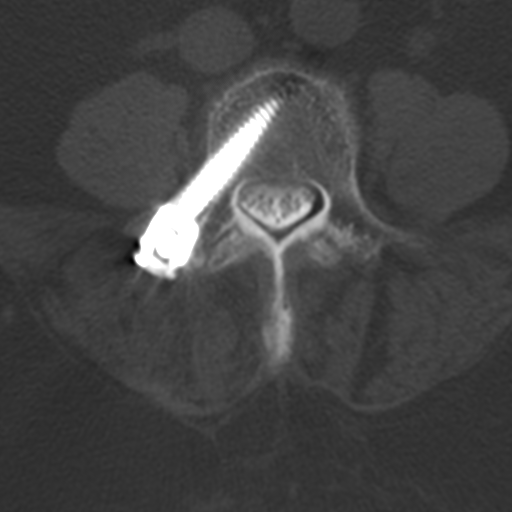
[im 53/79  bone]
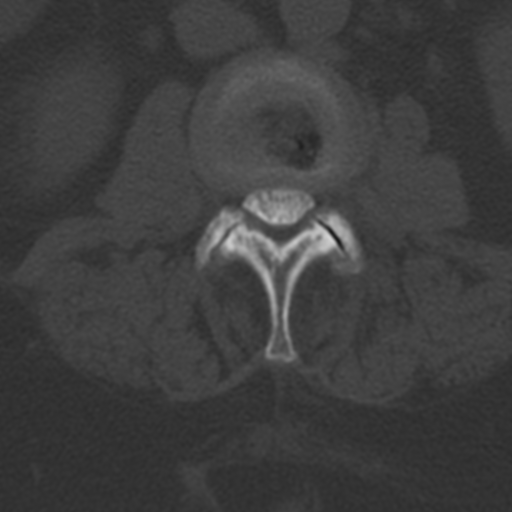
[im 66/79  soft-tissue]
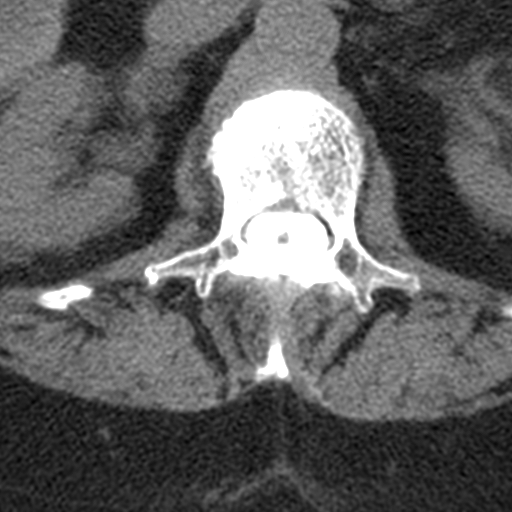
[im 66/79  bone]
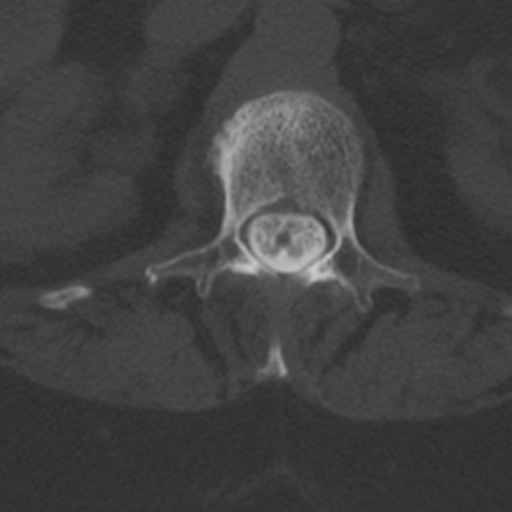

[5 of 14 positions shown; findings below may reference images not displayed]

EXAM:
LUMBAR MYELOGRAM

FLUOROSCOPY TIME:  dictate in minutes and seconds

PROCEDURE:
After thorough discussion of risks and benefits of the procedure
including bleeding, infection, injury to nerves, blood vessels,
adjacent structures as well as headache and CSF leak, written and
oral informed consent was obtained. Consent was obtained by Dr. Jumper
Darma. Time out form was completed.

Patient was positioned prone on the fluoroscopy table. Local
anesthesia was provided with 1% lidocaine without epinephrine after
prepped and draped in the usual sterile fashion. Puncture was
performed at L3 using a 3 1/2 inch 22-gauge spinal needle via
midline approach. Using a single pass through the dura, the needle
was placed within the thecal sac, with return of clear CSF. 15 mL of
Isovue-M 200 was injected into the thecal sac, with normal
opacification of the nerve roots and cauda equina consistent with
free flow within the subarachnoid space.

I personally performed the lumbar puncture and administered the
intrathecal contrast. I also personally supervised acquisition of
the myelogram images.
FINDINGS: LUMBAR MYELOGRAM FINDINGS:

Good opacification lumbar subarachnoid space. Previous interbody
fusion at L4-5 with a single XLIF cage, augmented with unilateral
pedicle screw construct at L4 and L5 on the RIGHT.

An extradural defect is noted at L3-4 on the RIGHT, effacing the
RIGHT L4 nerve root. There is mild stenosis at this level. At L1-2,
there is an additional extradural defect, also on the RIGHT, with
mild to moderate stenosis. RIGHT L2 nerve root impingement is
likely.

Mild to moderate stenosis is also present at L2-L3, without nerve
root cut off.

Advanced disc space narrowing is present at L1-2 and L3-4. Mild disc
space narrowing is present at L2-3. Only slight disc space narrowing
is present L5-S1.

Solid appearing fusion at L4-5.  Hardware appears intact.

With the patient prone for myelography, there is 1 mm
anterolisthesis at L2-3, 3 mm anterolisthesis at L3-4, and 4 mm
anterolisthesis at L5-S1.

With patient standing, there is mild dynamic instability at multiple
levels:

At L2-3, there is 1 mm anterolisthesis in neutral, 2 mm
anterolisthesis in extension, and 3 mm anterolisthesis in flexion.

At L3-4, there is 4 mm anterolisthesis in neutral, 4 mm
anterolisthesis in extension, and 5 mm anterolisthesis in flexion.

At L5-S1, there is 5 mm anterolisthesis in neutral, 6 mm
anterolisthesis in extension and 6 mm anterolisthesis in flexion.

At L1-2, on the AP view, the extradural defect on the RIGHT is much
more pronounced with patient standing.

CT LUMBAR MYELOGRAM FINDINGS:

Segmentation: Normal.

Alignment:  See below.

Vertebrae: No worrisome osseous lesion.Endplate sclerosis most
pronounced at L1-L2.

Conus medullaris: Normal in size and location.

Paraspinal tissues: Unremarkable. No significant atherosclerotic
calcification. Hepatic cystic disease better seen on previous
imaging.

Disc levels:

L1-L2: Central and rightward protrusion. Posterior element
hypertrophy. Advanced disc space narrowing, with vacuum phenomenon
and osseous spurring. Mild stenosis. RIGHT greater than LEFT L2
nerve root impingement. Significant foraminal narrowing likely
affects the RIGHT L1 nerve root.

L2-L3: 1 mm anterolisthesis. Posterior element hypertrophy. Mild
disc space narrowing. Annular bulge. Mild stenosis without definite
impingement.

L3-L4: 2 mm anterolisthesis. Advanced posterior element hypertrophy.
Central and rightward protrusion. Mild stenosis. RIGHT L4, and
possibly RIGHT L3, nerve root impingement.

L4-L5: Postsurgical change. Small laminotomy. RIGHT L4 and L5
pedicle screws appears solid. Interbody and posterior arthrodesis is
established. No residual impingement.

L5-S1: Advanced facet arthropathy. 1 mm anterolisthesis. Vacuum
phenomenon. Central protrusion. No significant subarticular zone/S1
nerve root impingement. BILATERAL foraminal narrowing not clearly
compressive.

Compared to prior  lumbar MR, similar appearance.
IMPRESSION: LUMBAR MYELOGRAM IMPRESSION:

Solid L4-5 arthrodesis without motion.

Mild dynamic instability at L3-4, where a central and rightward
protrusion along with posterior element hypertrophy contributes to
stenosis and RIGHT L4 nerve root impingement. Anterolisthesis
increases from 5 to 6 mm with patient standing in flexion.

RIGHT L1-2 extradural defect also consistent with disc protrusion.
Nerve root cut off affecting the RIGHT L2 nerve root is most
pronounced with patient standing as seen on the AP view.

Mild dynamic instability at L5-S1 and L2-3 as described above.

CT LUMBAR MYELOGRAM IMPRESSION:

Solid L4-5 arthrodesis.

Adjacent segment disease at L3-4 related to central and rightward
protrusion, 2 mm slip with patient recumbent for CT, as well as
posterior element hypertrophy. Mild stenosis with RIGHT L4, possibly
RIGHT L3 nerve root impingement.

Central and rightward protrusion L1-2 also with posterior element
hypertrophy. Advanced disc space narrowing. RIGHT L1 and L2 nerve
root impingement likely.

Advanced facet arthropathy L5-S1, mild anterolisthesis with central
protrusion, but no definite impingement.

Mild stenosis L2-L3.

## 2019-04-03 ENCOUNTER — Other Ambulatory Visit: Payer: Self-pay

## 2019-04-03 ENCOUNTER — Ambulatory Visit (INDEPENDENT_AMBULATORY_CARE_PROVIDER_SITE_OTHER): Payer: Medicare HMO | Admitting: Internal Medicine

## 2019-04-03 ENCOUNTER — Encounter: Payer: Self-pay | Admitting: Internal Medicine

## 2019-04-03 ENCOUNTER — Other Ambulatory Visit (INDEPENDENT_AMBULATORY_CARE_PROVIDER_SITE_OTHER): Payer: Medicare HMO

## 2019-04-03 VITALS — BP 118/66 | HR 72 | Temp 97.7°F | Ht 62.0 in | Wt 190.0 lb

## 2019-04-03 DIAGNOSIS — Z0000 Encounter for general adult medical examination without abnormal findings: Secondary | ICD-10-CM

## 2019-04-03 DIAGNOSIS — Z6837 Body mass index (BMI) 37.0-37.9, adult: Secondary | ICD-10-CM

## 2019-04-03 DIAGNOSIS — M810 Age-related osteoporosis without current pathological fracture: Secondary | ICD-10-CM

## 2019-04-03 DIAGNOSIS — Z23 Encounter for immunization: Secondary | ICD-10-CM

## 2019-04-03 LAB — BASIC METABOLIC PANEL
BUN: 25 mg/dL — ABNORMAL HIGH (ref 6–23)
CO2: 28 mEq/L (ref 19–32)
Calcium: 9.6 mg/dL (ref 8.4–10.5)
Chloride: 105 mEq/L (ref 96–112)
Creatinine, Ser: 1.11 mg/dL (ref 0.40–1.20)
GFR: 47.26 mL/min — ABNORMAL LOW (ref >60.00)
Glucose, Bld: 81 mg/dL (ref 70–99)
Potassium: 4 mEq/L (ref 3.5–5.1)
Sodium: 141 mEq/L (ref 135–145)

## 2019-04-03 LAB — URINALYSIS, ROUTINE W REFLEX MICROSCOPIC
Bilirubin Urine: NEGATIVE
Hgb urine dipstick: NEGATIVE
Ketones, ur: NEGATIVE
Nitrite: NEGATIVE
RBC / HPF: NONE SEEN (ref 0–?)
Specific Gravity, Urine: 1.01 (ref 1.000–1.030)
Total Protein, Urine: NEGATIVE
Urine Glucose: NEGATIVE
Urobilinogen, UA: 0.2 (ref 0.0–1.0)
pH: 5.5 (ref 5.0–8.0)

## 2019-04-03 LAB — CBC WITH DIFFERENTIAL/PLATELET
Basophils Absolute: 0.1 10*3/uL (ref 0.0–0.1)
Basophils Relative: 1.3 % (ref 0.0–3.0)
Eosinophils Absolute: 0.3 10*3/uL (ref 0.0–0.7)
Eosinophils Relative: 5.5 % — ABNORMAL HIGH (ref 0.0–5.0)
HCT: 39.5 % (ref 36.0–46.0)
Hemoglobin: 13.2 g/dL (ref 12.0–15.0)
Lymphocytes Relative: 25.2 % (ref 12.0–46.0)
Lymphs Abs: 1.4 10*3/uL (ref 0.7–4.0)
MCHC: 33.3 g/dL (ref 30.0–36.0)
MCV: 96.8 fl (ref 78.0–100.0)
Monocytes Absolute: 0.7 10*3/uL (ref 0.1–1.0)
Monocytes Relative: 11.8 % (ref 3.0–12.0)
Neutro Abs: 3.2 10*3/uL (ref 1.4–7.7)
Neutrophils Relative %: 56.2 % (ref 43.0–77.0)
Platelets: 252 10*3/uL (ref 150.0–400.0)
RBC: 4.08 Mil/uL (ref 3.87–5.11)
RDW: 13.4 % (ref 11.5–15.5)
WBC: 5.6 10*3/uL (ref 4.0–10.5)

## 2019-04-03 LAB — LIPID PANEL
Cholesterol: 185 mg/dL (ref 0–200)
HDL: 50.1 mg/dL (ref >39.00)
LDL Cholesterol: 101 mg/dL — ABNORMAL HIGH (ref 0–99)
NonHDL: 134.96
Total CHOL/HDL Ratio: 4
Triglycerides: 168 mg/dL — ABNORMAL HIGH (ref 0.0–149.0)
VLDL: 33.6 mg/dL (ref 0.0–40.0)

## 2019-04-03 LAB — HEPATIC FUNCTION PANEL
ALT: 15 U/L (ref 0–35)
AST: 20 U/L (ref 0–37)
Albumin: 4.2 g/dL (ref 3.5–5.2)
Alkaline Phosphatase: 64 U/L (ref 39–117)
Bilirubin, Direct: 0.1 mg/dL (ref 0.0–0.3)
Total Bilirubin: 0.5 mg/dL (ref 0.2–1.2)
Total Protein: 6.5 g/dL (ref 6.0–8.3)

## 2019-04-03 LAB — TSH: TSH: 1.2 u[IU]/mL (ref 0.35–4.50)

## 2019-04-03 MED ORDER — GABAPENTIN 300 MG PO CAPS: 300.0000 mg | ORAL_CAPSULE | Freq: Every day | ORAL | 3 refills | Status: DC

## 2019-04-03 MED ORDER — PANTOPRAZOLE SODIUM 40 MG PO TBEC: 40.0000 mg | DELAYED_RELEASE_TABLET | Freq: Every day | ORAL | 3 refills | Status: DC

## 2019-04-03 MED ORDER — DIAZEPAM 5 MG PO TABS: ORAL_TABLET | ORAL | 3 refills | Status: DC

## 2019-04-03 MED ORDER — CALCITONIN (SALMON) 200 UNIT/ACT NA SOLN: NASAL | 3 refills | Status: DC

## 2019-04-03 MED ORDER — LEVOTHYROXINE SODIUM 25 MCG PO TABS: ORAL_TABLET | ORAL | 3 refills | Status: DC

## 2019-04-03 MED ORDER — CELEBREX 200 MG PO CAPS: 200.0000 mg | ORAL_CAPSULE | Freq: Every day | ORAL | 5 refills | Status: DC

## 2019-04-03 NOTE — Patient Instructions (Signed)

## 2019-04-03 NOTE — Progress Notes (Signed)
Subjective:  Patient ID: Mariah Farrell, female    DOB: Sep 23, 1940  Age: 78 y.o. MRN: [id number]  CC: No chief complaint on file.   HPI Mariah Farrell presents for a well exam C/o LBP Walking daily  Outpatient Medications Prior to Visit  Medication Sig Dispense Refill  . aspirin 81 MG tablet Take 81 mg by mouth daily.    . Biotin 5000 MCG TABS Take 1 tablet by mouth daily.    . calcitonin, salmon, (MIACALCIN/FORTICAL) 200 UNIT/ACT nasal spray PLACE ONE SPRAY INTO ALTERNATING NOSTRILS DAILY 3.7 mL 3  . calcium-vitamin D (OSCAL WITH D) 500-200 MG-UNIT per tablet Take 1 tablet by mouth daily.    . CELEBREX 200 MG capsule Take 1 capsule (200 mg total) by mouth daily. 200 capsule 5  . cetirizine (ZYRTEC) 10 MG tablet Take 10 mg by mouth daily.    . Cholecalciferol (VITAMIN D3) 2000 units capsule Take 1 capsule (2,000 Units total) by mouth daily. 100 capsule 3  . diazepam (VALIUM) 5 MG tablet TAKE ONE-HALF TO ONE TABLET BY MOUTH EVERY 12 HOURS AS NEEDED FOR ANXIETY 90 tablet 3  . gabapentin (NEURONTIN) 300 MG capsule Take 1 capsule (300 mg total) by mouth at bedtime. 90 capsule 3  . glucosamine-chondroitin 500-400 MG tablet Take 1 tablet by mouth daily.    [name] levothyroxine (SYNTHROID, LEVOTHROID) 25 MCG tablet TAKE 1 TABLET BY MOUTH EVERY DAY BEFORE BREAKFAST 90 tablet 3  . magnesium oxide (MAG-OX) 400 MG tablet Take 400 mg by mouth daily.    . Multiple Vitamin (MULTIVITAMIN) tablet Take 1 tablet by mouth daily.    . pantoprazole (PROTONIX) 40 MG tablet TAKE ONE TABLET BY MOUTH DAILY 90 tablet 3  . timolol (TIMOPTIC) 0.5 % ophthalmic solution     . vitamin C (ASCORBIC ACID) 500 MG tablet Take 1,000 mg by mouth daily.     [name] gabapentin (NEURONTIN) 300 MG capsule Take 1 capsule (300 mg total) by mouth 3 (three) times daily as needed. Back pain 270 capsule 3   No facility-administered medications prior to visit.     ROS: Review of Systems  Constitutional: Negative for  activity change, appetite change, chills, fatigue and unexpected weight change.  HENT: Negative for congestion, mouth sores and sinus pressure.   Eyes: Negative for visual disturbance.  Respiratory: Negative for cough and chest tightness.   Gastrointestinal: Negative for abdominal pain and nausea.  Genitourinary: Negative for difficulty urinating, frequency and vaginal pain.  Musculoskeletal: Positive for back pain and gait problem.  Skin: Negative for pallor and rash.  Neurological: Negative for dizziness, tremors, weakness, numbness and headaches.  Psychiatric/Behavioral: Negative for confusion, sleep disturbance and suicidal ideas.    Objective:  BP 118/66 (BP Location: Left Arm, Patient Position: Sitting, Cuff Size: Normal)   Pulse 72   Temp 97.7 F (36.5 C) (Oral)   Ht 5\' 2"  (1.575 m)   Wt 190 lb (86.2 kg)   SpO2 94%   BMI 34.75 kg/m   BP Readings from Last 3 Encounters:  04/03/19 118/66  03/29/18 122/78  08/10/17 118/72    Wt Readings from Last 3 Encounters:  04/03/19 190 lb (86.2 kg)  03/29/18 188 lb (85.3 kg)  08/10/17 203 lb (92.1 kg)    Physical Exam Constitutional:      General: She is not in acute distress.    Appearance: She is well-developed.  HENT:     Head: Normocephalic.     Right Ear: External ear normal.  Left Ear: External ear normal.     Nose: Nose normal.  Eyes:     General:        Right eye: No discharge.        Left eye: No discharge.     Conjunctiva/sclera: Conjunctivae normal.     Pupils: Pupils are equal, round, and reactive to light.  Neck:     Musculoskeletal: Normal range of motion and neck supple.     Thyroid: No thyromegaly.     Vascular: No JVD.     Trachea: No tracheal deviation.  Cardiovascular:     Rate and Rhythm: Normal rate and regular rhythm.     Heart sounds: Normal heart sounds.  Pulmonary:     Effort: No respiratory distress.     Breath sounds: No stridor. No wheezing.  Abdominal:     General: Bowel sounds are  normal. There is no distension.     Palpations: Abdomen is soft. There is no mass.     Tenderness: There is no abdominal tenderness. There is no guarding or rebound.  Musculoskeletal:        General: Tenderness present.  Lymphadenopathy:     Cervical: No cervical adenopathy.  Skin:    Findings: No erythema or rash.  Neurological:     Cranial Nerves: No cranial nerve deficit.     Motor: No abnormal muscle tone.     Coordination: Coordination normal.     Deep Tendon Reflexes: Reflexes normal.  Psychiatric:        Behavior: Behavior normal.        Thought Content: Thought content normal.        Judgment: Judgment normal.   LS spine tender with range of motion  Lab Results  Component Value Date   WBC 5.6 03/29/2018   HGB 13.8 03/29/2018   HCT 40.9 03/29/2018   PLT 247.0 03/29/2018   GLUCOSE 85 03/29/2018   CHOL 170 03/29/2018   TRIG 89.0 03/29/2018   HDL 56.80 03/29/2018   LDLCALC 96 03/29/2018   ALT 15 03/29/2018   AST 20 03/29/2018   NA 141 03/29/2018   K 4.0 03/29/2018   CL 105 03/29/2018   CREATININE 1.00 03/29/2018   BUN 17 03/29/2018   CO2 28 03/29/2018   TSH 1.26 03/29/2018   INR 1.1 (H) 10/06/2016    Ct Cardiac Scoring  Addendum Date: 05/19/2018   ADDENDUM REPORT: 05/19/2018 17:21 CLINICAL DATA:  Risk stratification EXAM: Coronary Calcium Score TECHNIQUE: The patient was scanned on a Siemens Somatom 64 slice scanner. Axial non-contrast 3 mm slices were carried out through the heart. The data set was analyzed on a dedicated work station and scored using the [place]. FINDINGS: Non-cardiac: See separate report from [facility]. Ascending aorta: Normal diameter 3.7 cm Pericardium: Normal Coronary arteries: Calcium isolated to the ostial LAD and proximal LAD IMPRESSION: Coronary calcium score of 32. This was 21 rd percentile for age and sex matched control. Jenkins Rouge Electronically Signed   By: Jenkins Rouge M.D.   On: 05/19/2018 17:21   Result Date:  05/19/2018 EXAM: OVER-READ INTERPRETATION  CT CHEST The following report is an over-read performed by radiologist Dr. Rolm Baptise of [facility] on 05/19/2018. This over-read does not include interpretation of cardiac or coronary anatomy or pathology. The coronary calcium score interpretation by the cardiologist is attached. COMPARISON:  05/16/2016 abdominal CT FINDINGS: Vascular: Heart is normal size.  Visualized aorta is normal caliber. Mediastinum/Nodes: No adenopathy in the lower mediastinum  or hila. Lungs/Pleura: No confluent airspace opacities or effusions. Areas of scarring in the lower lobes bilaterally. Upper Abdomen: Low-density lesions within the visualized liver compatible with cysts, stable since prior abdominal CT. Musculoskeletal: Chest wall soft tissues are unremarkable. No acute bony abnormality. IMPRESSION: No acute extra cardiac abnormality. Areas of scarring in the lower lobes. Hepatic cysts again noted, stable. Electronically Signed: By: Rolm Baptise M.D. On: 05/19/2018 13:27    Assessment & Plan:   There are no diagnoses linked to this encounter.   No orders of the defined types were placed in this encounter.    Follow-up: No follow-ups on file.  Walker Kehr, MD

## 2019-04-03 NOTE — Assessment & Plan Note (Signed)
We discussed age appropriate health related issues, including available/recomended screening tests and vaccinations. We discussed a need for adhering to healthy diet and exercise. Labs were ordered to be later reviewed . All questions were answered.   

## 2019-04-03 NOTE — Assessment & Plan Note (Signed)
Wt Readings from Last 3 Encounters:  04/03/19 190 lb (86.2 kg)  03/29/18 188 lb (85.3 kg)  08/10/17 203 lb (92.1 kg)

## 2019-04-03 NOTE — Assessment & Plan Note (Signed)
Miacalcin nasal

## 2019-05-10 ENCOUNTER — Other Ambulatory Visit: Payer: Self-pay

## 2019-05-10 ENCOUNTER — Ambulatory Visit (INDEPENDENT_AMBULATORY_CARE_PROVIDER_SITE_OTHER): Payer: Medicare HMO | Admitting: Internal Medicine

## 2019-05-10 ENCOUNTER — Other Ambulatory Visit (INDEPENDENT_AMBULATORY_CARE_PROVIDER_SITE_OTHER): Payer: Medicare HMO

## 2019-05-10 ENCOUNTER — Encounter: Payer: Self-pay | Admitting: Internal Medicine

## 2019-05-10 DIAGNOSIS — R1084 Generalized abdominal pain: Secondary | ICD-10-CM | POA: Diagnosis not present

## 2019-05-10 DIAGNOSIS — N289 Disorder of kidney and ureter, unspecified: Secondary | ICD-10-CM

## 2019-05-10 DIAGNOSIS — M545 Low back pain, unspecified: Secondary | ICD-10-CM

## 2019-05-10 DIAGNOSIS — R1031 Right lower quadrant pain: Secondary | ICD-10-CM | POA: Diagnosis not present

## 2019-05-10 LAB — BASIC METABOLIC PANEL
BUN: 25 mg/dL — ABNORMAL HIGH (ref 6–23)
CO2: 27 mEq/L (ref 19–32)
Calcium: 9.5 mg/dL (ref 8.4–10.5)
Chloride: 104 mEq/L (ref 96–112)
Creatinine, Ser: 1.17 mg/dL (ref 0.40–1.20)
GFR: 44.46 mL/min — ABNORMAL LOW (ref >60.00)
Glucose, Bld: 85 mg/dL (ref 70–99)
Potassium: 4 mEq/L (ref 3.5–5.1)
Sodium: 139 mEq/L (ref 135–145)

## 2019-05-10 MED ORDER — TRAMADOL HCL 50 MG PO TABS: 50.0000 mg | ORAL_TABLET | Freq: Three times a day (TID) | ORAL | 0 refills | Status: AC | PRN

## 2019-05-10 NOTE — Assessment & Plan Note (Signed)
Not better after epidurals Tramadol prn  Potential benefits of a long term opioids use as well as potential risks (i.e. addiction risk, apnea etc) and complications (i.e. Somnolence, constipation and others) were explained to the patient and were aknowledged.

## 2019-05-10 NOTE — Assessment & Plan Note (Signed)
?  MSK strain vs other - worsening sx's... Abd CT w/contrast - hydrate for the procedure

## 2019-05-10 NOTE — Progress Notes (Signed)
Subjective:  Patient ID: [name], female    DOB: February 01, 1941  Age: 78 y.o. MRN: [id number]  CC: No chief complaint on file.   HPI Thresea Lathrop presents for RLQ moderate to severe dull and sharp pain in the RLQ and groin; worse w/turning, moving. It is getting worse over past 6 wks. Constant low grade discomfort. It kept Pat awake at night. Pat used heat, Tramadol w/some relief. No n/v/d. F/u decr GFR  Outpatient Medications Prior to Visit  Medication Sig Dispense Refill  . aspirin 81 MG tablet Take 81 mg by mouth daily.    . Biotin 5000 MCG TABS Take 1 tablet by mouth daily.    . calcitonin, salmon, (MIACALCIN/FORTICAL) 200 UNIT/ACT nasal spray PLACE ONE SPRAY INTO ALTERNATING NOSTRILS DAILY 3.7 mL 3  . calcium-vitamin D (OSCAL WITH D) 500-200 MG-UNIT per tablet Take 1 tablet by mouth daily.    . CELEBREX 200 MG capsule Take 1 capsule (200 mg total) by mouth daily. 200 capsule 5  . cetirizine (ZYRTEC) 10 MG tablet Take 10 mg by mouth daily.    . Cholecalciferol (VITAMIN D3) 2000 units capsule Take 1 capsule (2,000 Units total) by mouth daily. 100 capsule 3  . diazepam (VALIUM) 5 MG tablet TAKE ONE-HALF TO ONE TABLET BY MOUTH EVERY 12 HOURS AS NEEDED FOR ANXIETY 90 tablet 3  . gabapentin (NEURONTIN) 300 MG capsule Take 1 capsule (300 mg total) by mouth at bedtime. 90 capsule 3  . glucosamine-chondroitin 500-400 MG tablet Take 1 tablet by mouth daily.    [name] levothyroxine (SYNTHROID) 25 MCG tablet TAKE 1 TABLET BY MOUTH EVERY DAY BEFORE BREAKFAST 90 tablet 3  . magnesium oxide (MAG-OX) 400 MG tablet Take 400 mg by mouth daily.    . Multiple Vitamin (MULTIVITAMIN) tablet Take 1 tablet by mouth daily.    . pantoprazole (PROTONIX) 40 MG tablet Take 1 tablet (40 mg total) by mouth daily. 90 tablet 3  . timolol (TIMOPTIC) 0.5 % ophthalmic solution     . vitamin C (ASCORBIC ACID) 500 MG tablet Take 1,000 mg by mouth daily.      No facility-administered medications prior to  visit.     ROS: Review of Systems  Constitutional: Negative for activity change, appetite change, chills, diaphoresis, fatigue, fever and unexpected weight change.  HENT: Negative for congestion, mouth sores and sinus pressure.   Eyes: Negative for visual disturbance.  Respiratory: Negative for cough and chest tightness.   Gastrointestinal: Positive for abdominal pain and constipation. Negative for abdominal distention, blood in stool, nausea, rectal pain and vomiting.  Genitourinary: Negative for difficulty urinating, frequency and vaginal pain.  Musculoskeletal: Positive for arthralgias and back pain. Negative for gait problem.  Skin: Negative for pallor and rash.  Neurological: Negative for dizziness, tremors, weakness, numbness and headaches.  Psychiatric/Behavioral: Negative for confusion and sleep disturbance.    Objective:  BP 116/78 (BP Location: Left Arm, Patient Position: Sitting, Cuff Size: Normal)   Pulse 86   Temp 98.2 F (36.8 C) (Oral)   Ht 5\' 2"  (1.575 m)   Wt 187 lb (84.8 kg)   SpO2 93%   BMI 34.20 kg/m   BP Readings from Last 3 Encounters:  05/10/19 116/78  04/03/19 118/66  03/29/18 122/78    Wt Readings from Last 3 Encounters:  05/10/19 187 lb (84.8 kg)  04/03/19 190 lb (86.2 kg)  03/29/18 188 lb (85.3 kg)    Physical Exam Constitutional:      General: She  is not in acute distress.    Appearance: She is well-developed.  HENT:     Head: Normocephalic.     Right Ear: External ear normal.     Left Ear: External ear normal.     Nose: Nose normal.  Eyes:     General:        Right eye: No discharge.        Left eye: No discharge.     Conjunctiva/sclera: Conjunctivae normal.     Pupils: Pupils are equal, round, and reactive to light.  Neck:     Musculoskeletal: Normal range of motion and neck supple.     Thyroid: No thyromegaly.     Vascular: No JVD.     Trachea: No tracheal deviation.  Cardiovascular:     Rate and Rhythm: Normal rate and  regular rhythm.     Heart sounds: Normal heart sounds.  Pulmonary:     Effort: No respiratory distress.     Breath sounds: No stridor. No wheezing.  Abdominal:     General: Bowel sounds are normal. There is no distension.     Palpations: Abdomen is soft. There is no mass.     Tenderness: There is abdominal tenderness. There is no guarding or rebound.  Musculoskeletal:        General: No tenderness.  Lymphadenopathy:     Cervical: No cervical adenopathy.  Skin:    Findings: No erythema or rash.  Neurological:     Cranial Nerves: No cranial nerve deficit.     Motor: No abnormal muscle tone.     Coordination: Coordination normal.     Deep Tendon Reflexes: Reflexes normal.  Psychiatric:        Behavior: Behavior normal.        Thought Content: Thought content normal.        Judgment: Judgment normal.   R groin is tender w/deep palpation R hip NT Spine - painful w/ROM  Lab Results  Component Value Date   WBC 5.6 04/03/2019   HGB 13.2 04/03/2019   HCT 39.5 04/03/2019   PLT 252.0 04/03/2019   GLUCOSE 81 04/03/2019   CHOL 185 04/03/2019   TRIG 168.0 (H) 04/03/2019   HDL 50.10 04/03/2019   LDLCALC 101 (H) 04/03/2019   ALT 15 04/03/2019   AST 20 04/03/2019   NA 141 04/03/2019   K 4.0 04/03/2019   CL 105 04/03/2019   CREATININE 1.11 04/03/2019   BUN 25 (H) 04/03/2019   CO2 28 04/03/2019   TSH 1.20 04/03/2019   INR 1.1 (H) 10/06/2016    Ct Cardiac Scoring  Addendum Date: 05/19/2018   ADDENDUM REPORT: 05/19/2018 17:21 CLINICAL DATA:  Risk stratification EXAM: Coronary Calcium Score TECHNIQUE: The patient was scanned on a Siemens Somatom 64 slice scanner. Axial non-contrast 3 mm slices were carried out through the heart. The data set was analyzed on a dedicated work station and scored using the [place]. FINDINGS: Non-cardiac: See separate report from [facility]. Ascending aorta: Normal diameter 3.7 cm Pericardium: Normal Coronary arteries: Calcium isolated to  the ostial LAD and proximal LAD IMPRESSION: Coronary calcium score of 32. This was 78 rd percentile for age and sex matched control. Jenkins Rouge Electronically Signed   By: Jenkins Rouge M.D.   On: 05/19/2018 17:21   Result Date: 05/19/2018 EXAM: OVER-READ INTERPRETATION  CT CHEST The following report is an over-read performed by radiologist Dr. Rolm Baptise of [facility] on 05/19/2018. This over-read does not include interpretation  of cardiac or coronary anatomy or pathology. The coronary calcium score interpretation by the cardiologist is attached. COMPARISON:  05/16/2016 abdominal CT FINDINGS: Vascular: Heart is normal size.  Visualized aorta is normal caliber. Mediastinum/Nodes: No adenopathy in the lower mediastinum or hila. Lungs/Pleura: No confluent airspace opacities or effusions. Areas of scarring in the lower lobes bilaterally. Upper Abdomen: Low-density lesions within the visualized liver compatible with cysts, stable since prior abdominal CT. Musculoskeletal: Chest wall soft tissues are unremarkable. No acute bony abnormality. IMPRESSION: No acute extra cardiac abnormality. Areas of scarring in the lower lobes. Hepatic cysts again noted, stable. Electronically Signed: By: Rolm Baptise M.D. On: 05/19/2018 13:27    Assessment & Plan:   Diagnoses and all orders for this visit:  Generalized abdominal pain -     CT Abdomen Pelvis W Contrast; Future -     Basic Metabolic Panel (BMET); Future  Other orders -     traMADol (ULTRAM) 50 MG tablet; Take 1 tablet (50 mg total) by mouth every 8 (eight) hours as needed for up to 5 days.     Meds ordered this encounter  Medications  . traMADol (ULTRAM) 50 MG tablet    Sig: Take 1 tablet (50 mg total) by mouth every 8 (eight) hours as needed for up to 5 days.    Dispense:  90 tablet    Refill:  0     Follow-up: No follow-ups on file.  Walker Kehr, MD

## 2019-05-10 NOTE — Assessment & Plan Note (Signed)
GFR 47 - Stage 3a D/c or reduce Celebrex Hydration

## 2019-05-17 ENCOUNTER — Ambulatory Visit (INDEPENDENT_AMBULATORY_CARE_PROVIDER_SITE_OTHER)
Admission: RE | Admit: 2019-05-17 | Discharge: 2019-05-17 | Disposition: A | Discharge status: Home / Self Care | Payer: Medicare HMO | Source: Ambulatory Visit | Attending: Internal Medicine | Admitting: Internal Medicine

## 2019-05-17 ENCOUNTER — Encounter (INDEPENDENT_AMBULATORY_CARE_PROVIDER_SITE_OTHER): Payer: Self-pay

## 2019-05-17 ENCOUNTER — Other Ambulatory Visit: Payer: Self-pay

## 2019-05-17 DIAGNOSIS — R1084 Generalized abdominal pain: Secondary | ICD-10-CM

## 2019-05-17 MED ORDER — IOHEXOL 300 MG/ML  SOLN
80.0000 mL | Freq: Once | INTRAMUSCULAR | Status: AC | PRN
  Administered 2019-05-17: 80 mL via INTRAVENOUS

## 2019-06-08 LAB — COLOGUARD: Cologuard: NEGATIVE

## 2019-06-26 ENCOUNTER — Other Ambulatory Visit: Payer: Self-pay

## 2019-06-26 ENCOUNTER — Ambulatory Visit: Payer: Medicare HMO | Attending: Internal Medicine

## 2019-06-26 DIAGNOSIS — Z23 Encounter for immunization: Secondary | ICD-10-CM | POA: Insufficient documentation

## 2019-06-26 NOTE — Progress Notes (Signed)
   Covid-19 Vaccination Clinic  Name:  Monquie Schmeiser    MRN: [id number] DOB: [birth year]  06/26/2019  Ms. Carles was observed post Covid-19 immunization for 30 minutes based on pre-vaccination screening without incidence. She was provided with Vaccine Information Sheet and instruction to access the V-Safe system.   Ms. Maynes was instructed to call 911 with any severe reactions post vaccine: [name] Difficulty breathing  . Swelling of your face and throat  . A fast heartbeat  . A bad rash all over your body  . Dizziness and weakness    Immunizations Administered    Name Date Dose VIS Date Route   Pfizer COVID-19 Vaccine 06/26/2019 11:31 AM 0.3 mL 05/12/2019 Intramuscular   Manufacturer: [place]   Lot: [id number]   [place]: [id number]

## 2019-06-26 NOTE — Telephone Encounter (Signed)
New message    Husband is asking for 100 mg   Asking for generic    1. Which medications need to be refilled? (please list name of each medication and dose if known) CELEBREX 200 MG capsule -   2. Which pharmacy/location (including street and city if local pharmacy) is medication to be sent to? [facility] [address]    3. Do they need a 30 day or 90 day supply? 90 supply

## 2019-06-27 NOTE — Telephone Encounter (Signed)
OK Celecoxib 100 mg Thx

## 2019-06-27 NOTE — Telephone Encounter (Signed)
Please advise about changing RX

## 2019-06-28 MED ORDER — CELECOXIB 100 MG PO CAPS: 100.0000 mg | ORAL_CAPSULE | Freq: Every day | ORAL | 3 refills | Status: DC | PRN

## 2019-07-17 ENCOUNTER — Ambulatory Visit: Payer: Medicare HMO | Attending: Internal Medicine

## 2019-07-17 DIAGNOSIS — Z23 Encounter for immunization: Secondary | ICD-10-CM | POA: Insufficient documentation

## 2019-07-17 NOTE — Progress Notes (Signed)
   Covid-19 Vaccination Clinic  Name:  Kenadie Sloas    MRN: [id number] DOB: Apr 07, 1941  07/17/2019  Ms. Butto was observed post Covid-19 immunization for 30 minutes based on pre-vaccination screening without incidence. She was provided with Vaccine Information Sheet and instruction to access the V-Safe system.   Ms. Scalisi was instructed to call 911 with any severe reactions post vaccine: [name] Difficulty breathing  . Swelling of your face and throat  . A fast heartbeat  . A bad rash all over your body  . Dizziness and weakness    Immunizations Administered    Name Date Dose VIS Date Route   Pfizer COVID-19 Vaccine 07/17/2019 12:51 PM 0.3 mL 05/12/2019 Intramuscular   Manufacturer: [place]   Lot: [id number]   [place]: [id number]

## 2019-08-21 ENCOUNTER — Encounter: Payer: Self-pay | Admitting: Internal Medicine

## 2019-08-24 ENCOUNTER — Telehealth: Payer: Self-pay | Admitting: Internal Medicine

## 2019-08-24 MED ORDER — NORTRIPTYLINE HCL 10 MG PO CAPS: 10.0000 mg | ORAL_CAPSULE | Freq: Every day | ORAL | 1 refills | Status: DC

## 2019-08-24 NOTE — Telephone Encounter (Signed)
Per Lenon Ahmadi needs Nortrypt antidepressant 2 dtrs dx'd w/breast cancer   Done

## 2019-09-29 ENCOUNTER — Telehealth: Payer: Self-pay | Admitting: Internal Medicine

## 2019-09-29 MED ORDER — SERTRALINE HCL 50 MG PO TABS: 50.0000 mg | ORAL_TABLET | Freq: Every day | ORAL | 5 refills | Status: DC

## 2019-09-29 NOTE — Telephone Encounter (Signed)
Wants to d/c nortriptyline - not helping; and start Zoloft

## 2019-10-11 ENCOUNTER — Other Ambulatory Visit: Payer: Self-pay | Admitting: Internal Medicine

## 2019-11-03 ENCOUNTER — Other Ambulatory Visit: Payer: Self-pay | Admitting: Internal Medicine

## 2019-11-22 ENCOUNTER — Other Ambulatory Visit: Payer: Self-pay

## 2019-11-22 ENCOUNTER — Ambulatory Visit (INDEPENDENT_AMBULATORY_CARE_PROVIDER_SITE_OTHER): Payer: Medicare HMO | Admitting: Otolaryngology

## 2019-11-22 ENCOUNTER — Encounter (INDEPENDENT_AMBULATORY_CARE_PROVIDER_SITE_OTHER): Payer: Self-pay | Admitting: Otolaryngology

## 2019-11-22 VITALS — Temp 97.5°F

## 2019-11-22 DIAGNOSIS — M26629 Arthralgia of temporomandibular joint, unspecified side: Secondary | ICD-10-CM

## 2019-11-22 NOTE — Progress Notes (Signed)
HPI: Mariah Farrell is a 79 y.o. female who presents for evaluation of right ear pain.  She complains of pain and discomfort on the right side of her face that she has had for several weeks.  She used to be followed by Dr. 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  Her husband is Dr. Andres Ege. She has not noted any change in her hearing.  No drainage from the ear.  Past Medical History:  Diagnosis Date  . Arthritis   . Cancer ([place]) 13 years ago   bladder  . GERD (gastroesophageal reflux disease)   . Goiter    synthroid used to shrink  . H/O hiatal hernia   . Headache(784.0)    migraines in morning  . PONV (postoperative nausea and vomiting)   . Seasonal allergies    Past Surgical History:  Procedure Laterality Date  . ARTHRODESIS FOOT WITH WEIL OSTEOTOMY Right 07/26/2014   Procedure: RIGHT HALLUX METATARSOPHALANGEAL JOINT ARTHRODESIS; RIGHT SECOND METATARSAL WEIL OSTEOTOMY; DORSAL CAPSULOTOMY EXTENSOR TENDON LENGTHENING;  Surgeon: Wylene Simmer, MD;  Location: [facility];  Service: Orthopedics;  Laterality: Right;  . BLADDER SURGERY  13 years ago  . BUNIONECTOMY Bilateral   . COLONOSCOPY    . LUMBAR FUSION  May 2014   L4-L5  . ROTATOR CUFF REPAIR Bilateral ~2010, 2012  . SPINAL FUSION     L1, L2  . TONSILLECTOMY  as child  . TOTAL KNEE ARTHROPLASTY Right 07/19/2012   Procedure: TOTAL KNEE ARTHROPLASTY;  Surgeon: Mauri Pole, MD;  Location: [facility];  Service: Orthopedics;  Laterality: Right;  . TOTAL KNEE ARTHROPLASTY Left 08/01/2013   Procedure: LEFT TOTAL KNEE ARTHROPLASTY;  Surgeon: Mauri Pole, MD;  Location: [facility];  Service: Orthopedics;  Laterality: Left;   Social History   Socioeconomic History  . Marital status: Married    Spouse name: Not on file  . Number of children: Not on file  . Years of education: Not on file  . Highest education level: Not on file  Occupational History  .  Not on file  Tobacco Use  . Smoking status: Former Smoker    Packs/day: 0.25    Years: 5.00    Pack years: 1.25    Types: Cigarettes    Quit date: 06/01/1964    Years since quitting: 55.5  . Smokeless tobacco: Never Used  Vaping Use  . Vaping Use: Never used  Substance and Sexual Activity  . Alcohol use: Yes    Comment: 1-2 times weekly  . Drug use: No  . Sexual activity: Not on file  Other Topics Concern  . Not on file  Social History Narrative  . Not on file   Social Determinants of Health   Financial Resource Strain:   . Difficulty of Paying Living Expenses:   Food Insecurity:   . Worried About Charity fundraiser in the Last Year:   . Arboriculturist in the Last Year:   Transportation Needs:   . Film/video editor (Medical):   [name] Lack of Transportation (Non-Medical):   Physical Activity:   . Days of Exercise per Week:   . Minutes of Exercise per Session:   Stress:   . Feeling of Stress :   Social Connections:   . Frequency of Communication with Friends and Family:   . Frequency of Social Gatherings with Friends and Family:   . Attends  Religious Services:   . Active Member of Clubs or Organizations:   . Attends Archivist Meetings:   [name] Marital Status:    Family History  Problem Relation Age of Onset  . Cancer Brother 54       pancreatic  . Brain cancer Father    Allergies  Allergen Reactions  . Doxycycline Hives  . Crab [Shellfish Allergy]   . Nexium [Esomeprazole Magnesium]     palpitations  . Penicillins   . Prolia [Denosumab]     Jaw necrosis wound   Prior to Admission medications   Medication Sig Start Date End Date Taking? Authorizing Provider  aspirin 81 MG tablet Take 81 mg by mouth daily.   Yes [provider]  Biotin 5000 MCG TABS Take 1 tablet by mouth daily.   Yes [provider]  calcitonin, salmon, (MIACALCIN/FORTICAL) 200 UNIT/ACT nasal spray PLACE 1 SPRAY INTO ALTERNATING NOSTRILS DAILY 10/11/19  Yes  Plotnikov, Evie Lacks, MD  calcium-vitamin D (OSCAL WITH D) 500-200 MG-UNIT per tablet Take 1 tablet by mouth daily.   Yes [provider]  celecoxib (CELEBREX) 100 MG capsule Take 1 capsule (100 mg total) by mouth daily as needed for moderate pain. 06/28/19  Yes Plotnikov, Evie Lacks, MD  cetirizine (ZYRTEC) 10 MG tablet Take 10 mg by mouth daily.   Yes [provider]  Cholecalciferol (VITAMIN D3) 2000 units capsule Take 1 capsule (2,000 Units total) by mouth daily. 03/24/17  Yes Plotnikov, Evie Lacks, MD  diazepam (VALIUM) 5 MG tablet TAKE 1/2 TO 1 TABLET BY MOUTH EVERY 12 HOURS AS NEEDED FOR ANXIETY 11/03/19  Yes Plotnikov, Evie Lacks, MD  gabapentin (NEURONTIN) 300 MG capsule Take 1 capsule (300 mg total) by mouth at bedtime. 04/03/19  Yes Plotnikov, Evie Lacks, MD  glucosamine-chondroitin 500-400 MG tablet Take 1 tablet by mouth daily.   Yes [provider]  levothyroxine (SYNTHROID) 25 MCG tablet TAKE 1 TABLET BY MOUTH EVERY DAY BEFORE BREAKFAST 04/03/19  Yes Plotnikov, Evie Lacks, MD  magnesium oxide (MAG-OX) 400 MG tablet Take 400 mg by mouth daily.   Yes [provider]  Multiple Vitamin (MULTIVITAMIN) tablet Take 1 tablet by mouth daily.   Yes [provider]  pantoprazole (PROTONIX) 40 MG tablet Take 1 tablet (40 mg total) by mouth daily. 04/03/19  Yes Plotnikov, Evie Lacks, MD  sertraline (ZOLOFT) 50 MG tablet Take 1 tablet (50 mg total) by mouth daily. 09/29/19  Yes Plotnikov, Evie Lacks, MD  timolol (TIMOPTIC) 0.5 % ophthalmic solution  11/19/14  Yes [provider]  vitamin C (ASCORBIC ACID) 500 MG tablet Take 1,000 mg by mouth daily.    Yes [provider]     Positive ROS: Otherwise negative  All other systems have been reviewed and were otherwise negative with the exception of those mentioned in the HPI and as above.  Physical Exam: Constitutional: Alert, well-appearing, no acute distress Ears: External ears without lesions or  tenderness. Ear canals are clear bilaterally.  On microscopic exam of TMs patient has a monomeric membrane anteriorly inferiorly on the right side where she had previous myringotomy tube placed but TMs are clear and intact bilaterally.  No middle ear space abnormality noted.  Hearing was symmetric on tuning fork testing. Nasal: External nose without lesions. Clear nasal passages Oral: Lips and gums without lesions. Tongue and palate mucosa without lesions. Posterior oropharynx clear. Neck: No palpable adenopathy or masses.  On palpation of the TMJ joints she has slight  right TMJ dysfunction with slight discomfort. Respiratory: Breathing comfortably  Skin: No facial/neck lesions or rash noted.  Procedures  Assessment: Right ear pain I suspect is probably related to TMJ dysfunction. Reassured her of normal ear canal and TM evaluation otherwise.  Plan: For the ear pain and discomfort which actually is doing better suggested use of NSAIDs and soft diet. She also states that she sometimes has trouble equalizing pressure in her ears [place].  She cannot take decongestant medication.  Suggested use of Flonase 2 sprays each nostril on a daily basis which should help with eustachian tube dysfunction.  Radene Journey, MD

## 2020-01-04 ENCOUNTER — Ambulatory Visit (INDEPENDENT_AMBULATORY_CARE_PROVIDER_SITE_OTHER): Payer: Medicare HMO | Admitting: Internal Medicine

## 2020-01-04 ENCOUNTER — Other Ambulatory Visit: Payer: Self-pay

## 2020-01-04 ENCOUNTER — Telehealth: Payer: Self-pay | Admitting: Internal Medicine

## 2020-01-04 ENCOUNTER — Encounter: Payer: Self-pay | Admitting: Internal Medicine

## 2020-01-04 DIAGNOSIS — F4321 Adjustment disorder with depressed mood: Secondary | ICD-10-CM | POA: Diagnosis not present

## 2020-01-04 MED ORDER — ESCITALOPRAM OXALATE 5 MG PO TABS: 5.0000 mg | ORAL_TABLET | Freq: Every day | ORAL | 5 refills | Status: DC

## 2020-01-04 NOTE — Patient Instructions (Signed)
Take Lexapro (Escitalopram) 1/2 tablet a day for 4 days, then one tablet a day at bedtime

## 2020-01-04 NOTE — Telephone Encounter (Signed)
Mariah Farrell, Can we add [name]  to today's sch at 4:30? Pls let her know. Thanks, AP

## 2020-01-04 NOTE — Assessment & Plan Note (Addendum)
Options discussed.  Take Lexapro (Escitalopram) 1/2 tablet a day for 4 days, then one tablet a day at bedtime

## 2020-01-04 NOTE — Telephone Encounter (Signed)
Patient added to schedule at 4:00 since there is no 4:30 time slot.

## 2020-01-04 NOTE — Progress Notes (Signed)
Subjective:  Patient ID: Mariah Farrell, female    DOB: 08/24/1940  Age: 79 y.o. MRN: [id number]  CC: No chief complaint on file.   HPI Mariah Farrell presents for stress at home (2 of her daughters were diagnosed with breast cancer and receiving treatment) and depression  Outpatient Medications Prior to Visit  Medication Sig Dispense Refill  . aspirin 81 MG tablet Take 81 mg by mouth daily.    . Biotin 5000 MCG TABS Take 1 tablet by mouth daily.    . calcitonin, salmon, (MIACALCIN/FORTICAL) 200 UNIT/ACT nasal spray PLACE 1 SPRAY INTO ALTERNATING NOSTRILS DAILY 3.7 mL 2  . calcium-vitamin D (OSCAL WITH D) 500-200 MG-UNIT per tablet Take 1 tablet by mouth daily.    . celecoxib (CELEBREX) 100 MG capsule Take 1 capsule (100 mg total) by mouth daily as needed for moderate pain. 90 capsule 3  . cetirizine (ZYRTEC) 10 MG tablet Take 10 mg by mouth daily.    . Cholecalciferol (VITAMIN D3) 2000 units capsule Take 1 capsule (2,000 Units total) by mouth daily. 100 capsule 3  . diazepam (VALIUM) 5 MG tablet TAKE 1/2 TO 1 TABLET BY MOUTH EVERY 12 HOURS AS NEEDED FOR ANXIETY 90 tablet 2  . gabapentin (NEURONTIN) 300 MG capsule Take 1 capsule (300 mg total) by mouth at bedtime. 90 capsule 3  . glucosamine-chondroitin 500-400 MG tablet Take 1 tablet by mouth daily.    [name] levothyroxine (SYNTHROID) 25 MCG tablet TAKE 1 TABLET BY MOUTH EVERY DAY BEFORE BREAKFAST 90 tablet 3  . magnesium oxide (MAG-OX) 400 MG tablet Take 400 mg by mouth daily.    . Multiple Vitamin (MULTIVITAMIN) tablet Take 1 tablet by mouth daily.    . pantoprazole (PROTONIX) 40 MG tablet Take 1 tablet (40 mg total) by mouth daily. 90 tablet 3  . sertraline (ZOLOFT) 50 MG tablet Take 1 tablet (50 mg total) by mouth daily. 30 tablet 5  . timolol (TIMOPTIC) 0.5 % ophthalmic solution     . vitamin C (ASCORBIC ACID) 500 MG tablet Take 1,000 mg by mouth daily.      No facility-administered medications prior to visit.     ROS: Review of Systems  Constitutional: Negative for activity change, appetite change, chills, fatigue and unexpected weight change.  HENT: Negative for congestion, mouth sores and sinus pressure.   Eyes: Negative for visual disturbance.  Respiratory: Negative for cough and chest tightness.   Gastrointestinal: Negative for abdominal pain and nausea.  Genitourinary: Negative for difficulty urinating, frequency and vaginal pain.  Musculoskeletal: Positive for back pain. Negative for gait problem.  Skin: Negative for pallor and rash.  Neurological: Negative for dizziness, tremors, weakness, numbness and headaches.  Psychiatric/Behavioral: Positive for decreased concentration, dysphoric mood and sleep disturbance. Negative for confusion and suicidal ideas. The patient is nervous/anxious.     Objective:  BP 128/70 (BP Location: Left Arm, Patient Position: Sitting, Cuff Size: Large)   Pulse 71   Temp 98.3 F (36.8 C) (Oral)   Ht 5\' 2"  (1.575 m)   Wt 191 lb (86.6 kg)   SpO2 97%   BMI 34.93 kg/m   BP Readings from Last 3 Encounters:  01/04/20 128/70  05/10/19 116/78  04/03/19 118/66    Wt Readings from Last 3 Encounters:  01/04/20 191 lb (86.6 kg)  05/10/19 187 lb (84.8 kg)  04/03/19 190 lb (86.2 kg)    Physical Exam Constitutional:      Appearance: Normal appearance. She is obese.  Psychiatric:  Mood and Affect: Mood normal.        Behavior: Behavior normal.        Thought Content: Thought content normal.        Judgment: Judgment normal.     Lab Results  Component Value Date   WBC 5.6 04/03/2019   HGB 13.2 04/03/2019   HCT 39.5 04/03/2019   PLT 252.0 04/03/2019   GLUCOSE 85 05/10/2019   CHOL 185 04/03/2019   TRIG 168.0 (H) 04/03/2019   HDL 50.10 04/03/2019   LDLCALC 101 (H) 04/03/2019   ALT 15 04/03/2019   AST 20 04/03/2019   NA 139 05/10/2019   K 4.0 05/10/2019   CL 104 05/10/2019   CREATININE 1.17 05/10/2019   BUN 25 (H) 05/10/2019   CO2 27  05/10/2019   TSH 1.20 04/03/2019   INR 1.1 (H) 10/06/2016    CT Abdomen Pelvis W Contrast  Result Date: 05/17/2019 CLINICAL DATA:  Daily right-sided flank pain radiating to the right groin for the past 2-3 months. History of bladder cancer. EXAM: CT ABDOMEN AND PELVIS WITH CONTRAST TECHNIQUE: Multidetector CT imaging of the abdomen and pelvis was performed using the standard protocol following bolus administration of intravenous contrast. CONTRAST:  29mL OMNIPAQUE IOHEXOL 300 MG/ML  SOLN COMPARISON:  CT abdomen pelvis-05/16/2016 FINDINGS: Lower chest: Limited visualization of the lower thorax demonstrates minimal subsegmental atelectasis within the imaged bilateral lung bases, right greater than left. No discrete focal airspace opacities. No pleural effusion. Borderline cardiomegaly.  No pericardial effusion. Hepatobiliary: Normal hepatic contour. Redemonstrated multiple hypoattenuating hepatic cysts min with dominant cyst within the dome of the left lobe of the liver measuring approximately 3.1 cm in diameter (image 10, series 2 and dominant partially exophytic cyst arising from the caudal aspect the right lobe of the liver measuring approximately 4.1 cm (image 31, series 2). Additional subcentimeter hypoattenuating hepatic lesions are too small to adequately characterize of favored to represent additional hepatic cysts. No discrete worrisome hepatic lesions. Normal appearance of the gallbladder given degree distention. No radiopaque gallstones. No intra or extrahepatic biliary ductal dilatation. No ascites. Pancreas: Normal appearance of the pancreas. Spleen: Normal appearance of the spleen. Note is made of a small splenule. Adrenals/Urinary Tract: There is symmetric enhancement and excretion of the bilateral kidneys. No definite renal stones on this postcontrast examination. Bilateral subcentimeter hypoattenuating renal lesions are too small to accurately characterize though favored to represent renal  cysts. No urinary obstruction or perinephric stranding. Normal appearance of the bilateral adrenal glands. Normal appearance of the urinary bladder given underdistention. Stomach/Bowel: Ingested enteric contrast extends to the level of the rectum. No evidence of enteric obstruction. Scattered minimal colonic diverticulosis without evidence of superimposed acute diverticulitis. Normal appearance of the terminal ileum and retrocecal appendix. No discrete areas of bowel wall thickening. Small hiatal hernia. No pneumoperitoneum, pneumatosis or portal venous gas. Vascular/Lymphatic: Normal caliber the abdominal aorta. The major branch vessels of the abdominal aorta appear patent on this non CTA examination. There is a very minimal amount of atherosclerotic plaque involving the left common iliac artery, not resulting in hemodynamically significant stenosis No bulky retroperitoneal, mesenteric, pelvic or inguinal lymphadenopathy. Reproductive: Normal appearance of the pelvic organs for age. No discrete adnexal lesion. Several phleboliths are seen about the left adnexa. No free fluid the pelvic cul-de-sac. Other: Regional soft tissues appear normal. Musculoskeletal: Post L1-L2 and L4-L5 right-sided paraspinal fusion and the sequela of L4-L5 intervertebral disc space replacement without evidence of hardware failure or loosening. Redemonstrated osseous fusion involving  the T9 through T11 vertebral bodies, similar to abdominal CT performed 05/2016. Moderate multilevel lumbar spine DDD, worse at T11-T12, L2-L3, L3-L4 and L5-S1 with disc space height loss and endplate irregularity. IMPRESSION: 1. No definite explanation for patient's radiating right-sided flank pain. Specifically, no evidence of nephrolithiasis, urinary or enteric obstruction. Normal appearance of the terminal ileum and appendix. 2. Redemonstrated multiple hepatic cysts. Electronically Signed   By: Sandi Mariscal M.D.   On: 05/17/2019 13:22    Assessment & Plan:    There are no diagnoses linked to this encounter.   No orders of the defined types were placed in this encounter.    Follow-up: No follow-ups on file.  Walker Kehr, MD

## 2020-01-07 ENCOUNTER — Encounter: Payer: Self-pay | Admitting: Internal Medicine

## 2020-01-09 ENCOUNTER — Ambulatory Visit: Payer: Medicare HMO | Admitting: Internal Medicine

## 2020-02-23 ENCOUNTER — Other Ambulatory Visit: Payer: Self-pay | Admitting: Internal Medicine

## 2020-04-29 ENCOUNTER — Other Ambulatory Visit: Payer: Self-pay | Admitting: *Deleted

## 2020-04-29 MED ORDER — LEVOTHYROXINE SODIUM 25 MCG PO TABS
ORAL_TABLET | ORAL | 0 refills | Status: DC
Start: 2020-04-29 — End: 2020-05-29

## 2020-05-06 DIAGNOSIS — M5030 Other cervical disc degeneration, unspecified cervical region: Secondary | ICD-10-CM | POA: Insufficient documentation

## 2020-05-06 DIAGNOSIS — M5412 Radiculopathy, cervical region: Secondary | ICD-10-CM | POA: Insufficient documentation

## 2020-05-23 ENCOUNTER — Other Ambulatory Visit: Payer: Self-pay | Admitting: Internal Medicine

## 2020-05-28 ENCOUNTER — Other Ambulatory Visit: Payer: Self-pay | Admitting: Internal Medicine

## 2020-05-29 ENCOUNTER — Other Ambulatory Visit: Payer: Self-pay

## 2020-05-29 MED ORDER — LEVOTHYROXINE SODIUM 25 MCG PO TABS
ORAL_TABLET | ORAL | 0 refills | Status: DC
Start: 2020-05-29 — End: 2020-06-24

## 2020-05-29 MED ORDER — PANTOPRAZOLE SODIUM 40 MG PO TBEC
40.0000 mg | DELAYED_RELEASE_TABLET | Freq: Every day | ORAL | 0 refills | Status: DC
Start: 2020-05-29 — End: 2020-07-23

## 2020-05-30 MED ORDER — GABAPENTIN 300 MG PO CAPS
300.0000 mg | ORAL_CAPSULE | Freq: Every day | ORAL | 3 refills | Status: DC
Start: 2020-05-30 — End: 2020-06-25

## 2020-06-14 ENCOUNTER — Encounter: Payer: Medicare HMO | Admitting: Internal Medicine

## 2020-06-18 ENCOUNTER — Telehealth: Payer: Self-pay | Admitting: Internal Medicine

## 2020-06-18 DIAGNOSIS — M79672 Pain in left foot: Secondary | ICD-10-CM | POA: Insufficient documentation

## 2020-06-18 NOTE — Telephone Encounter (Signed)
Infection, big toe left foot, needs an antibiotic  [place], [place] Phone:  [phone]  Fax:  [phone]     Please call [phone] cell

## 2020-06-19 ENCOUNTER — Telehealth: Payer: Self-pay | Admitting: Internal Medicine

## 2020-06-19 ENCOUNTER — Other Ambulatory Visit: Payer: Self-pay

## 2020-06-19 DIAGNOSIS — L600 Ingrowing nail: Secondary | ICD-10-CM | POA: Diagnosis not present

## 2020-06-19 DIAGNOSIS — M79672 Pain in left foot: Secondary | ICD-10-CM | POA: Diagnosis not present

## 2020-06-19 MED ORDER — CEPHALEXIN 500 MG PO CAPS: 500.0000 mg | ORAL_CAPSULE | Freq: Four times a day (QID) | ORAL | 0 refills | Status: DC

## 2020-06-19 NOTE — Telephone Encounter (Signed)
Okay Keflex.  Thanks

## 2020-06-19 NOTE — Telephone Encounter (Signed)
Called pt there was no answer LMOM MD sent antibiotic to [facility]. Pls keep appt for tomorrow.Marland KitchenJohny Chess

## 2020-06-19 NOTE — Telephone Encounter (Signed)
LVM for pt to rtn my call to schedule AWV with [facility]. Please schedule awv with [facility] if pt calls the office.  

## 2020-06-20 ENCOUNTER — Ambulatory Visit (INDEPENDENT_AMBULATORY_CARE_PROVIDER_SITE_OTHER): Payer: Medicare HMO

## 2020-06-20 ENCOUNTER — Ambulatory Visit (INDEPENDENT_AMBULATORY_CARE_PROVIDER_SITE_OTHER): Payer: Medicare HMO | Admitting: Internal Medicine

## 2020-06-20 ENCOUNTER — Encounter: Payer: Self-pay | Admitting: Internal Medicine

## 2020-06-20 VITALS — BP 130/72 | HR 73 | Temp 98.0°F | Ht 62.0 in | Wt 184.2 lb

## 2020-06-20 DIAGNOSIS — R0600 Dyspnea, unspecified: Secondary | ICD-10-CM

## 2020-06-20 DIAGNOSIS — E785 Hyperlipidemia, unspecified: Secondary | ICD-10-CM

## 2020-06-20 DIAGNOSIS — L600 Ingrowing nail: Secondary | ICD-10-CM

## 2020-06-20 DIAGNOSIS — F439 Reaction to severe stress, unspecified: Secondary | ICD-10-CM

## 2020-06-20 DIAGNOSIS — Z0000 Encounter for general adult medical examination without abnormal findings: Secondary | ICD-10-CM

## 2020-06-20 DIAGNOSIS — R69 Illness, unspecified: Secondary | ICD-10-CM | POA: Diagnosis not present

## 2020-06-20 DIAGNOSIS — Z6837 Body mass index (BMI) 37.0-37.9, adult: Secondary | ICD-10-CM

## 2020-06-20 DIAGNOSIS — M545 Low back pain, unspecified: Secondary | ICD-10-CM | POA: Diagnosis not present

## 2020-06-20 DIAGNOSIS — N289 Disorder of kidney and ureter, unspecified: Secondary | ICD-10-CM

## 2020-06-20 DIAGNOSIS — R0609 Other forms of dyspnea: Secondary | ICD-10-CM

## 2020-06-20 DIAGNOSIS — F4321 Adjustment disorder with depressed mood: Secondary | ICD-10-CM

## 2020-06-20 LAB — COMPREHENSIVE METABOLIC PANEL
ALT: 14 U/L (ref 0–35)
AST: 23 U/L (ref 0–37)
Albumin: 4.5 g/dL (ref 3.5–5.2)
Alkaline Phosphatase: 62 U/L (ref 39–117)
BUN: 22 mg/dL (ref 6–23)
CO2: 32 mEq/L (ref 19–32)
Calcium: 10 mg/dL (ref 8.4–10.5)
Chloride: 99 mEq/L (ref 96–112)
Creatinine, Ser: 1.23 mg/dL — ABNORMAL HIGH (ref 0.40–1.20)
GFR: 41.2 mL/min — ABNORMAL LOW (ref >60.00)
Glucose, Bld: 86 mg/dL (ref 70–99)
Potassium: 3.8 mEq/L (ref 3.5–5.1)
Sodium: 138 mEq/L (ref 135–145)
Total Bilirubin: 1 mg/dL (ref 0.2–1.2)
Total Protein: 7.2 g/dL (ref 6.0–8.3)

## 2020-06-20 LAB — CBC WITH DIFFERENTIAL/PLATELET
Basophils Absolute: 0.1 10*3/uL (ref 0.0–0.1)
Basophils Relative: 1.2 % (ref 0.0–3.0)
Eosinophils Absolute: 0.3 10*3/uL (ref 0.0–0.7)
Eosinophils Relative: 4.3 % (ref 0.0–5.0)
HCT: 41.2 % (ref 36.0–46.0)
Hemoglobin: 14.3 g/dL (ref 12.0–15.0)
Lymphocytes Relative: 14 % (ref 12.0–46.0)
Lymphs Abs: 0.9 10*3/uL (ref 0.7–4.0)
MCHC: 34.7 g/dL (ref 30.0–36.0)
MCV: 96.1 fl (ref 78.0–100.0)
Monocytes Absolute: 0.8 10*3/uL (ref 0.1–1.0)
Monocytes Relative: 11.8 % (ref 3.0–12.0)
Neutro Abs: 4.4 10*3/uL (ref 1.4–7.7)
Neutrophils Relative %: 68.7 % (ref 43.0–77.0)
Platelets: 247 10*3/uL (ref 150.0–400.0)
RBC: 4.29 Mil/uL (ref 3.87–5.11)
RDW: 13.2 % (ref 11.5–15.5)
WBC: 6.4 10*3/uL (ref 4.0–10.5)

## 2020-06-20 LAB — URINALYSIS, ROUTINE W REFLEX MICROSCOPIC
Bilirubin Urine: NEGATIVE
Hgb urine dipstick: NEGATIVE
Ketones, ur: NEGATIVE
Nitrite: NEGATIVE
RBC / HPF: NONE SEEN (ref 0–?)
Specific Gravity, Urine: 1.02 (ref 1.000–1.030)
Total Protein, Urine: NEGATIVE
Urine Glucose: NEGATIVE
Urobilinogen, UA: 0.2 (ref 0.0–1.0)
pH: 5.5 (ref 5.0–8.0)

## 2020-06-20 LAB — LIPID PANEL
Cholesterol: 187 mg/dL (ref 0–200)
HDL: 58.7 mg/dL (ref >39.00)
LDL Cholesterol: 100 mg/dL — ABNORMAL HIGH (ref 0–99)
NonHDL: 128.22
Total CHOL/HDL Ratio: 3
Triglycerides: 141 mg/dL (ref 0.0–149.0)
VLDL: 28.2 mg/dL (ref 0.0–40.0)

## 2020-06-20 LAB — TSH: TSH: 1.69 u[IU]/mL (ref 0.35–4.50)

## 2020-06-20 MED ORDER — PREDNISONE 5 MG PO TABS: ORAL_TABLET | ORAL | 3 refills | Status: DC

## 2020-06-20 NOTE — Assessment & Plan Note (Signed)
It is a little better.

## 2020-06-20 NOTE — Assessment & Plan Note (Signed)
Pt lost wt 

## 2020-06-20 NOTE — Assessment & Plan Note (Signed)
Recurrent.  R big toe status post toenail removal.  Dressing was unwrapped.  A layer of gauze was stuck to the wound.  I put triple antibiotic ointment on it and dressed it.  [name] will remove this gauze in the shower later today Finish oral antibiotic

## 2020-06-20 NOTE — Progress Notes (Signed)
Subjective:  Patient ID: Mariah Farrell, female    DOB: [birth year]  Age: 80 y.o. MRN: [id number]  CC: Annual Exam   HPI Mariah Farrell presents for OA, LBP, stress, ingrown toenail  Outpatient Medications Prior to Visit  Medication Sig Dispense Refill  . Biotin 5000 MCG TABS Take 1 tablet by mouth daily.    . calcitonin, salmon, (MIACALCIN/FORTICAL) 200 UNIT/ACT nasal spray PLACE 1 SPRAY INTO ALTERNATING NOSTRILS DAILY 11.1 mL 11  . calcium-vitamin D (OSCAL WITH D) 500-200 MG-UNIT per tablet Take 1 tablet by mouth daily.    . celecoxib (CELEBREX) 100 MG capsule Take 1 capsule (100 mg total) by mouth daily as needed for moderate pain. 90 capsule 3  . cephALEXin (KEFLEX) 500 MG capsule Take 1 capsule (500 mg total) by mouth 4 (four) times daily. 28 capsule 0  . cetirizine (ZYRTEC) 10 MG tablet Take 10 mg by mouth daily.    . Cholecalciferol (VITAMIN D3) 2000 units capsule Take 1 capsule (2,000 Units total) by mouth daily. 100 capsule 3  . diazepam (VALIUM) 5 MG tablet TAKE 1/2 TO 1 TABLET BY MOUTH EVERY 12 HOURS AS NEEDED FOR ANXIETY 90 tablet 2  . escitalopram (LEXAPRO) 5 MG tablet Take 1 tablet (5 mg total) by mouth daily. 30 tablet 5  . gabapentin (NEURONTIN) 300 MG capsule Take 1 capsule (300 mg total) by mouth at bedtime. 90 capsule 3  . glucosamine-chondroitin 500-400 MG tablet Take 1 tablet by mouth daily.    [name] levothyroxine (SYNTHROID) 25 MCG tablet TAKE 1 TABLET BY MOUTH EVERY DAY BEFORE BREAKFAST. Annual appt IS overdue must see provider for future refills 30 tablet 0  . Multiple Vitamin (MULTIVITAMIN) tablet Take 1 tablet by mouth daily.    . pantoprazole (PROTONIX) 40 MG tablet Take 1 tablet (40 mg total) by mouth daily. Annual appt is overdue must see provider for future refills 30 tablet 0  . timolol (TIMOPTIC) 0.5 % ophthalmic solution     . vitamin C (ASCORBIC ACID) 500 MG tablet Take 1,000 mg by mouth daily.     [name] aspirin 81 MG tablet Take 81 mg by mouth  daily. (Patient not taking: Reported on 06/20/2020)    . magnesium oxide (MAG-OX) 400 MG tablet Take 400 mg by mouth daily. (Patient not taking: Reported on 06/20/2020)     No facility-administered medications prior to visit.    ROS: Review of Systems  Constitutional: Positive for fatigue. Negative for activity change, appetite change, chills and unexpected weight change.  HENT: Negative for congestion, mouth sores and sinus pressure.   Eyes: Negative for visual disturbance.  Respiratory: Negative for cough and chest tightness.   Gastrointestinal: Negative for abdominal pain and nausea.  Genitourinary: Negative for difficulty urinating, frequency and vaginal pain.  Musculoskeletal: Positive for arthralgias and back pain. Negative for gait problem.  Skin: Negative for pallor and rash.  Neurological: Negative for dizziness, tremors, weakness, numbness and headaches.  Psychiatric/Behavioral: Positive for sleep disturbance. Negative for confusion. The patient is nervous/anxious.     Objective:  BP 130/72 (BP Location: Left Arm)   Pulse 73   Temp 98 F (36.7 C) (Oral)   Ht 5\' 2"  (1.575 m)   Wt 184 lb 3.2 oz (83.6 kg)   SpO2 94%   BMI 33.69 kg/m   BP Readings from Last 3 Encounters:  06/20/20 130/72  01/04/20 128/70  05/10/19 116/78    Wt Readings from Last 3 Encounters:  06/20/20 184 lb 3.2 oz (  83.6 kg)  01/04/20 191 lb (86.6 kg)  05/10/19 187 lb (84.8 kg)    Physical Exam Constitutional:      General: She is not in acute distress.    Appearance: She is well-developed. She is obese.  HENT:     Head: Normocephalic.     Right Ear: External ear normal.     Left Ear: External ear normal.     Nose: Nose normal.     Mouth/Throat:     Mouth: Oropharynx is clear and moist.  Eyes:     General:        Right eye: No discharge.        Left eye: No discharge.     Conjunctiva/sclera: Conjunctivae normal.     Pupils: Pupils are equal, round, and reactive to light.  Neck:      Thyroid: No thyromegaly.     Vascular: No JVD.     Trachea: No tracheal deviation.  Cardiovascular:     Rate and Rhythm: Normal rate and regular rhythm.     Heart sounds: Normal heart sounds.  Pulmonary:     Effort: No respiratory distress.     Breath sounds: No stridor. No wheezing.  Abdominal:     General: Bowel sounds are normal. There is no distension.     Palpations: Abdomen is soft. There is no mass.     Tenderness: There is no abdominal tenderness. There is no guarding or rebound.  Musculoskeletal:        General: Tenderness present. No edema.     Cervical back: Normal range of motion and neck supple.  Lymphadenopathy:     Cervical: No cervical adenopathy.  Skin:    Findings: No erythema or rash.  Neurological:     Mental Status: She is oriented to person, place, and time.     Cranial Nerves: No cranial nerve deficit.     Motor: No abnormal muscle tone.     Coordination: Coordination normal.     Deep Tendon Reflexes: Reflexes normal.  Psychiatric:        Mood and Affect: Mood and affect normal.        Behavior: Behavior normal.        Thought Content: Thought content normal.        Judgment: Judgment normal.   R big toe status post toenail removal.  Dressing was unwrapped.  A layer of gauze was stuck to the wound.  I put triple antibiotic ointment on it and dressed it.  [name] will remove this gauze in the shower later today  Lab Results  Component Value Date   WBC 5.6 04/03/2019   HGB 13.2 04/03/2019   HCT 39.5 04/03/2019   PLT 252.0 04/03/2019   GLUCOSE 85 05/10/2019   CHOL 185 04/03/2019   TRIG 168.0 (H) 04/03/2019   HDL 50.10 04/03/2019   LDLCALC 101 (H) 04/03/2019   ALT 15 04/03/2019   AST 20 04/03/2019   NA 139 05/10/2019   K 4.0 05/10/2019   CL 104 05/10/2019   CREATININE 1.17 05/10/2019   BUN 25 (H) 05/10/2019   CO2 27 05/10/2019   TSH 1.20 04/03/2019   INR 1.1 (H) 10/06/2016    CT Abdomen Pelvis W Contrast  Result Date: 05/17/2019 CLINICAL  DATA:  Daily right-sided flank pain radiating to the right groin for the past 2-3 months. History of bladder cancer. EXAM: CT ABDOMEN AND PELVIS WITH CONTRAST TECHNIQUE: Multidetector CT imaging of the abdomen and pelvis was performed using  the standard protocol following bolus administration of intravenous contrast. CONTRAST:  22mL OMNIPAQUE IOHEXOL 300 MG/ML  SOLN COMPARISON:  CT abdomen pelvis-05/16/2016 FINDINGS: Lower chest: Limited visualization of the lower thorax demonstrates minimal subsegmental atelectasis within the imaged bilateral lung bases, right greater than left. No discrete focal airspace opacities. No pleural effusion. Borderline cardiomegaly.  No pericardial effusion. Hepatobiliary: Normal hepatic contour. Redemonstrated multiple hypoattenuating hepatic cysts min with dominant cyst within the dome of the left lobe of the liver measuring approximately 3.1 cm in diameter (image 10, series 2 and dominant partially exophytic cyst arising from the caudal aspect the right lobe of the liver measuring approximately 4.1 cm (image 31, series 2). Additional subcentimeter hypoattenuating hepatic lesions are too small to adequately characterize of favored to represent additional hepatic cysts. No discrete worrisome hepatic lesions. Normal appearance of the gallbladder given degree distention. No radiopaque gallstones. No intra or extrahepatic biliary ductal dilatation. No ascites. Pancreas: Normal appearance of the pancreas. Spleen: Normal appearance of the spleen. Note is made of a small splenule. Adrenals/Urinary Tract: There is symmetric enhancement and excretion of the bilateral kidneys. No definite renal stones on this postcontrast examination. Bilateral subcentimeter hypoattenuating renal lesions are too small to accurately characterize though favored to represent renal cysts. No urinary obstruction or perinephric stranding. Normal appearance of the bilateral adrenal glands. Normal appearance of the  urinary bladder given underdistention. Stomach/Bowel: Ingested enteric contrast extends to the level of the rectum. No evidence of enteric obstruction. Scattered minimal colonic diverticulosis without evidence of superimposed acute diverticulitis. Normal appearance of the terminal ileum and retrocecal appendix. No discrete areas of bowel wall thickening. Small hiatal hernia. No pneumoperitoneum, pneumatosis or portal venous gas. Vascular/Lymphatic: Normal caliber the abdominal aorta. The major branch vessels of the abdominal aorta appear patent on this non CTA examination. There is a very minimal amount of atherosclerotic plaque involving the left common iliac artery, not resulting in hemodynamically significant stenosis No bulky retroperitoneal, mesenteric, pelvic or inguinal lymphadenopathy. Reproductive: Normal appearance of the pelvic organs for age. No discrete adnexal lesion. Several phleboliths are seen about the left adnexa. No free fluid the pelvic cul-de-sac. Other: Regional soft tissues appear normal. Musculoskeletal: Post L1-L2 and L4-L5 right-sided paraspinal fusion and the sequela of L4-L5 intervertebral disc space replacement without evidence of hardware failure or loosening. Redemonstrated osseous fusion involving the T9 through T11 vertebral bodies, similar to abdominal CT performed 05/2016. Moderate multilevel lumbar spine DDD, worse at T11-T12, L2-L3, L3-L4 and L5-S1 with disc space height loss and endplate irregularity. IMPRESSION: 1. No definite explanation for patient's radiating right-sided flank pain. Specifically, no evidence of nephrolithiasis, urinary or enteric obstruction. Normal appearance of the terminal ileum and appendix. 2. Redemonstrated multiple hepatic cysts. Electronically Signed   By: Sandi Mariscal M.D.   On: 05/17/2019 13:22    Assessment & Plan:   Walker Kehr, MD

## 2020-06-20 NOTE — Assessment & Plan Note (Signed)
Prednisone taper as needed

## 2020-06-20 NOTE — Assessment & Plan Note (Signed)
Obtain basic metabolic panel.  Continue with good hydration

## 2020-06-20 NOTE — Assessment & Plan Note (Signed)
?  deconditioning

## 2020-06-20 NOTE — Assessment & Plan Note (Signed)
Stress is better On Lexapro

## 2020-06-24 ENCOUNTER — Other Ambulatory Visit: Payer: Self-pay | Admitting: Internal Medicine

## 2020-06-25 ENCOUNTER — Other Ambulatory Visit: Payer: Self-pay | Admitting: Internal Medicine

## 2020-06-26 ENCOUNTER — Other Ambulatory Visit: Payer: Self-pay | Admitting: Internal Medicine

## 2020-07-10 DIAGNOSIS — Z20822 Contact with and (suspected) exposure to covid-19: Secondary | ICD-10-CM | POA: Diagnosis not present

## 2020-07-23 ENCOUNTER — Other Ambulatory Visit: Payer: Self-pay | Admitting: Internal Medicine

## 2020-08-06 DIAGNOSIS — M5412 Radiculopathy, cervical region: Secondary | ICD-10-CM | POA: Diagnosis not present

## 2020-08-08 DIAGNOSIS — M542 Cervicalgia: Secondary | ICD-10-CM | POA: Diagnosis not present

## 2020-08-14 DIAGNOSIS — H2513 Age-related nuclear cataract, bilateral: Secondary | ICD-10-CM | POA: Diagnosis not present

## 2020-08-14 DIAGNOSIS — H5203 Hypermetropia, bilateral: Secondary | ICD-10-CM | POA: Diagnosis not present

## 2020-08-14 DIAGNOSIS — H18593 Other hereditary corneal dystrophies, bilateral: Secondary | ICD-10-CM | POA: Diagnosis not present

## 2020-08-14 DIAGNOSIS — H40053 Ocular hypertension, bilateral: Secondary | ICD-10-CM | POA: Diagnosis not present

## 2020-09-04 ENCOUNTER — Other Ambulatory Visit: Payer: Self-pay | Admitting: Internal Medicine

## 2020-09-04 DIAGNOSIS — E041 Nontoxic single thyroid nodule: Secondary | ICD-10-CM

## 2020-09-04 NOTE — Progress Notes (Signed)
[place]

## 2020-09-05 DIAGNOSIS — M5412 Radiculopathy, cervical region: Secondary | ICD-10-CM | POA: Diagnosis not present

## 2020-09-06 ENCOUNTER — Other Ambulatory Visit: Payer: Self-pay | Admitting: Internal Medicine

## 2020-09-06 DIAGNOSIS — E041 Nontoxic single thyroid nodule: Secondary | ICD-10-CM

## 2020-09-16 ENCOUNTER — Ambulatory Visit
Admission: RE | Admit: 2020-09-16 | Discharge: 2020-09-16 | Disposition: A | Discharge status: Home / Self Care | Payer: Medicare HMO | Source: Ambulatory Visit | Attending: Internal Medicine | Admitting: Internal Medicine

## 2020-09-16 DIAGNOSIS — E041 Nontoxic single thyroid nodule: Secondary | ICD-10-CM

## 2020-09-17 ENCOUNTER — Other Ambulatory Visit (HOSPITAL_BASED_OUTPATIENT_CLINIC_OR_DEPARTMENT_OTHER): Payer: Self-pay

## 2020-09-18 ENCOUNTER — Telehealth: Payer: Self-pay | Admitting: Specialist

## 2020-09-18 ENCOUNTER — Other Ambulatory Visit: Payer: Self-pay

## 2020-09-18 ENCOUNTER — Ambulatory Visit: Payer: Medicare HMO | Attending: Internal Medicine

## 2020-09-18 DIAGNOSIS — L821 Other seborrheic keratosis: Secondary | ICD-10-CM | POA: Diagnosis not present

## 2020-09-18 DIAGNOSIS — D2271 Melanocytic nevi of right lower limb, including hip: Secondary | ICD-10-CM | POA: Diagnosis not present

## 2020-09-18 DIAGNOSIS — L570 Actinic keratosis: Secondary | ICD-10-CM | POA: Diagnosis not present

## 2020-09-18 DIAGNOSIS — Z23 Encounter for immunization: Secondary | ICD-10-CM

## 2020-09-18 DIAGNOSIS — D2272 Melanocytic nevi of left lower limb, including hip: Secondary | ICD-10-CM | POA: Diagnosis not present

## 2020-09-18 DIAGNOSIS — Z85828 Personal history of other malignant neoplasm of skin: Secondary | ICD-10-CM | POA: Diagnosis not present

## 2020-09-18 DIAGNOSIS — L814 Other melanin hyperpigmentation: Secondary | ICD-10-CM | POA: Diagnosis not present

## 2020-09-18 DIAGNOSIS — D1801 Hemangioma of skin and subcutaneous tissue: Secondary | ICD-10-CM | POA: Diagnosis not present

## 2020-09-18 DIAGNOSIS — D225 Melanocytic nevi of trunk: Secondary | ICD-10-CM | POA: Diagnosis not present

## 2020-09-18 NOTE — Telephone Encounter (Signed)
Received call from patient's husband [name] asking if patient can worked into Dr. Otho Ket  Schedule as soon as possible for neck pain.  The  number to contact [name] is [phone] or [phone] Home  number

## 2020-09-18 NOTE — Progress Notes (Signed)
   Covid-19 Vaccination Clinic  Name:  Mariah Farrell    MRN: [id number] DOB: 1941/03/12  09/18/2020  Mariah Farrell was observed post Covid-19 immunization for 15 minutes without incident. She was provided with Vaccine Information Sheet and instruction to access the V-Safe system.   Mariah Farrell was instructed to call 911 with any severe reactions post vaccine: [name] Difficulty breathing  . Swelling of face and throat  . A fast heartbeat  . A bad rash all over body  . Dizziness and weakness   Immunizations Administered    Name Date Dose VIS Date Route   PFIZER Comrnaty(Gray TOP) Covid-19 Vaccine 09/18/2020 12:18 PM 0.3 mL 05/09/2020 Intramuscular   Manufacturer: [place]   Lot: [id number]   NDC: [phone]

## 2020-09-19 NOTE — Telephone Encounter (Signed)
Can you ask him about this?

## 2020-09-19 NOTE — Telephone Encounter (Signed)
Per Dr. Louanne Skye ok to work in tomorrow @ [facility]. I called husband and informed of work in

## 2020-09-19 NOTE — Telephone Encounter (Signed)
Can you ask him about this? I didn't see him to ask, he was just in and out of rooms this morning

## 2020-09-20 ENCOUNTER — Encounter: Payer: Self-pay | Admitting: Specialist

## 2020-09-20 ENCOUNTER — Ambulatory Visit: Payer: Self-pay

## 2020-09-20 ENCOUNTER — Ambulatory Visit: Payer: Medicare HMO | Admitting: Specialist

## 2020-09-20 VITALS — BP 105/64 | HR 82 | Ht 62.0 in | Wt 185.0 lb

## 2020-09-20 DIAGNOSIS — M4802 Spinal stenosis, cervical region: Secondary | ICD-10-CM

## 2020-09-20 DIAGNOSIS — R200 Anesthesia of skin: Secondary | ICD-10-CM | POA: Diagnosis not present

## 2020-09-20 DIAGNOSIS — M542 Cervicalgia: Secondary | ICD-10-CM | POA: Diagnosis not present

## 2020-09-20 DIAGNOSIS — R202 Paresthesia of skin: Secondary | ICD-10-CM | POA: Diagnosis not present

## 2020-09-20 MED ORDER — METHYLPREDNISOLONE 4 MG PO TABS: ORAL_TABLET | ORAL | 0 refills | Status: AC

## 2020-09-20 NOTE — Progress Notes (Signed)
Office Visit Note   Patient: Mariah Farrell           Date of Birth: 1940/07/06           MRN: [id number] Visit Date: 09/20/2020              Requested by: Cassandria Anger, MD [place],  [place] [id number] PCP: Plotnikov, Evie Lacks, MD   Assessment & Plan: Visit Diagnoses:  1. Cervicalgia   2. Spinal stenosis of cervical region   3. Numbness and tingling of right arm     Plan:Avoid overhead lifting and overhead use of the arms. Do not lift greater than 5 lbs. Adjust head rest in vehicle to prevent hyperextension if rear ended. Take extra precautions to avoid falling. EMG/NCV right arm  PT for McKenzie exercises, Saunders traction TENS  Follow-Up Instructions: Return in about 3 weeks (around 10/11/2020).   Orders:  Orders Placed This Encounter  Procedures  . XR Cervical Spine 2 or 3 views  . Ambulatory referral to Physical Medicine Rehab  . Ambulatory referral to Physical Therapy   Meds ordered this encounter  Medications  . methylPREDNISolone (MEDROL) 4 MG tablet    Sig: Take 1 tablet (4 mg total) by mouth daily for 14 days, THEN 0.5 tablets (2 mg total) daily for 14 days.    Dispense:  21 tablet    Refill:  0      Procedures: No procedures performed   Clinical Data: No additional findings.   Subjective: Chief Complaint  Patient presents with  . Neck - Pain    80 year old right handed female with history of right arm pain and numbness into the right ulnar 2 digits. Pain is present during the day time and in the AM. She sleeps half sitting up propping up with pillows. She felt it in the arm and down the right side. Pain also between the shoulder blades. No off balance or coordination. She is dropping items with the hands, has severe bilateral basal thumb joint. Has had right C6 and C8 Cortisone injections. She had better improvement with the second injection. No bowel or bladder difficulty.    Review of Systems  Constitutional:  Negative for activity change, appetite change, chills, diaphoresis, fatigue, fever and unexpected weight change.  HENT: Positive for congestion, sinus pressure, sinus pain and sneezing. Negative for dental problem, drooling, ear discharge, ear pain, facial swelling, hearing loss, mouth sores, nosebleeds, postnasal drip, rhinorrhea, sore throat, tinnitus, trouble swallowing and voice change.   Eyes: Positive for redness and itching. Negative for photophobia, pain, discharge and visual disturbance.  Respiratory: Positive for cough. Negative for apnea, choking, chest tightness, shortness of breath, wheezing and stridor.   Cardiovascular: Negative.  Negative for chest pain, palpitations and leg swelling.  Gastrointestinal: Negative for abdominal distention, abdominal pain, anal bleeding, blood in stool, constipation, diarrhea, nausea, rectal pain and vomiting.  Endocrine: Negative for cold intolerance, heat intolerance, polydipsia, polyphagia and polyuria.  Genitourinary: Negative.   Musculoskeletal: Positive for back pain. Negative for arthralgias, gait problem, joint swelling, myalgias, neck pain and neck stiffness.  Skin: Negative.  Negative for color change, pallor, rash and wound.  Neurological: Positive for weakness and numbness. Negative for dizziness, tremors, seizures, syncope, facial asymmetry, speech difficulty, light-headedness and headaches.  Hematological: Negative.  Negative for adenopathy. Does not bruise/bleed easily.  Psychiatric/Behavioral: Negative.  Negative for agitation, behavioral problems, confusion, decreased concentration, dysphoric mood, hallucinations, self-injury, sleep disturbance and suicidal ideas. The  patient is not nervous/anxious and is not hyperactive.      Objective: Vital Signs: BP 105/64 (BP Location: Left Arm, Patient Position: Sitting)   Pulse 82   Ht 5\' 2"  (1.575 m)   Wt 185 lb (83.9 kg)   BMI 33.84 kg/m   Physical Exam Constitutional:      General:  She is not in acute distress.    Appearance: Normal appearance. She is normal weight. She is not ill-appearing or diaphoretic.  HENT:     Head: Normocephalic and atraumatic.     Right Ear: There is no impacted cerumen.     Left Ear: There is no impacted cerumen.     Nose: No congestion or rhinorrhea.     Mouth/Throat:     Pharynx: No oropharyngeal exudate or posterior oropharyngeal erythema.  Eyes:     General:        Right eye: No discharge.        Left eye: No discharge.     Extraocular Movements: Extraocular movements intact.     Pupils: Pupils are equal, round, and reactive to light.  Musculoskeletal:     Cervical back: Rigidity present. No tenderness.  Neurological:     Mental Status: She is alert.     Back Exam   Tenderness  The patient is experiencing tenderness in the cervical.  Range of Motion  Extension: abnormal  Flexion: normal  Lateral bend right: abnormal  Lateral bend left: abnormal  Rotation right: abnormal  Rotation left: abnormal       Specialty Comments:  No specialty comments available.  Imaging: No results found.   PMFS History: Patient Active Problem List   Diagnosis Date Noted  . DOE (dyspnea on exertion) 06/20/2020  . Ingrown toenail of right foot 06/20/2020  . Situational depression 01/04/2020  . Renal insufficiency 05/10/2019  . Breast mass, right 08/10/2017  . 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 Stress at home 03/24/2017  . Pre-operative examination for internal medicine 10/06/2016  . RLQ abdominal pain 05/16/2016  . Need for prophylactic vaccination and inoculation against influenza 03/04/2016  . Allergic rhinitis 02/20/2014  . Osteoporosis 12/18/2013  . Expected blood loss anemia 07/20/2012  . Obesity, unspecified 07/20/2012  . S/P left TKA 07/19/2012  . Tinnitus 09/28/2011  . Well adult exam 09/28/2011  . Knee pain 09/28/2011  . Goiter 04/18/2009  . TOBACCO USE, QUIT 04/18/2009  .  OSTEOARTHRITIS 04/11/2007  . Lumbar back pain 04/11/2007  . COLONIC POLYPS, HX OF 04/11/2007   Past Medical History:  Diagnosis Date  . Arthritis   . Cancer ([place]) 13 years ago   bladder  . GERD (gastroesophageal reflux disease)   . Goiter    synthroid used to shrink  . H/O hiatal hernia   . Headache(784.0)    migraines in morning  . PONV (postoperative nausea and vomiting)   . Seasonal allergies     Family History  Problem Relation Age of Onset  . Cancer Brother 54       pancreatic  . Brain cancer Father     Past Surgical History:  Procedure Laterality Date  . ARTHRODESIS FOOT WITH WEIL OSTEOTOMY Right 07/26/2014   Procedure: RIGHT HALLUX METATARSOPHALANGEAL JOINT ARTHRODESIS; RIGHT SECOND METATARSAL WEIL OSTEOTOMY; DORSAL CAPSULOTOMY EXTENSOR TENDON LENGTHENING;  Surgeon: Wylene Simmer, MD;  Location: [facility];  Service: Orthopedics;  Laterality: Right;  . BLADDER SURGERY  13 years ago  . BUNIONECTOMY Bilateral   . COLONOSCOPY    .  LUMBAR FUSION  May 2014   L4-L5  . ROTATOR CUFF REPAIR Bilateral ~2010, 2012  . SPINAL FUSION     L1, L2  . TONSILLECTOMY  as child  . TOTAL KNEE ARTHROPLASTY Right 07/19/2012   Procedure: TOTAL KNEE ARTHROPLASTY;  Surgeon: Mauri Pole, MD;  Location: [facility];  Service: Orthopedics;  Laterality: Right;  . TOTAL KNEE ARTHROPLASTY Left 08/01/2013   Procedure: LEFT TOTAL KNEE ARTHROPLASTY;  Surgeon: Mauri Pole, MD;  Location: [facility];  Service: Orthopedics;  Laterality: Left;   Social History   Occupational History  . Not on file  Tobacco Use  . Smoking status: Former Smoker    Packs/day: 0.25    Years: 5.00    Pack years: 1.25    Types: Cigarettes    Quit date: 06/01/1964    Years since quitting: 56.3  . Smokeless tobacco: Never Used  Vaping Use  . Vaping Use: Never used  Substance and Sexual Activity  . Alcohol use: Yes    Comment: 1-2 times weekly  . Drug use: No  . Sexual activity: Not on file

## 2020-09-20 NOTE — Patient Instructions (Addendum)
Avoid overhead lifting and overhead use of the arms. Do not lift greater than 5 lbs. Adjust head rest in vehicle to prevent hyperextension if rear ended. Take extra precautions to avoid falling. EMG/NCV right arm  PT for McKenzie exercises, Saunders traction TENS

## 2020-09-26 ENCOUNTER — Other Ambulatory Visit: Payer: Self-pay

## 2020-09-26 ENCOUNTER — Ambulatory Visit (INDEPENDENT_AMBULATORY_CARE_PROVIDER_SITE_OTHER): Payer: Medicare HMO

## 2020-09-26 VITALS — BP 110/70 | HR 80 | Temp 97.6°F | Resp 16 | Ht 62.0 in | Wt 185.6 lb

## 2020-09-26 DIAGNOSIS — Z0000 Encounter for general adult medical examination without abnormal findings: Secondary | ICD-10-CM | POA: Diagnosis not present

## 2020-09-26 NOTE — Patient Instructions (Addendum)
Mariah Farrell , Thank you for taking time to come for your Medicare Wellness Visit. I appreciate your ongoing commitment to your health goals. Please review the following plan we discussed and let me know if I can assist you in the future.   Screening recommendations/referrals: Colonoscopy: last done 11/19/2011; no repeat due to age 80: 09/12/2020 Bone Density: 12/24/2015; no longer recommended Recommended yearly ophthalmology/optometry visit for glaucoma screening and checkup Recommended yearly dental visit for hygiene and checkup  Vaccinations: Influenza vaccine: 03/01/2020 Pneumococcal vaccine: 01/03/2014, 04/03/2019 Tdap vaccine: 03/04/2016; due every 10 years Shingles vaccine: 02/12/2018, 04/25/2018   Covid-19: 06/26/2019, 2/15/201, 02/10/2020, 09/18/2020  Advanced directives: Please bring a copy of your health care power of attorney and living will to the office at your convenience.  Conditions/risks identified: Yes; Reviewed health maintenance screenings with patient today and relevant education, vaccines, and/or referrals were provided. Please continue to do your personal lifestyle choices by: daily care of teeth and gums, regular physical activity (goal should be 5 days a week for 30 minutes), eat a healthy diet, avoid tobacco and drug use, limiting any alcohol intake, taking a low-dose aspirin (if not allergic or have been advised by your provider otherwise) and taking vitamins and minerals as recommended by your provider. Continue doing brain stimulating activities (puzzles, reading, adult coloring books, staying active) to keep memory sharp. Continue to eat heart healthy diet (full of fruits, vegetables, whole grains, lean protein, water--limit salt, fat, and sugar intake) and increase physical activity as tolerated.  Next appointment: Please schedule your next Medicare Wellness Visit with your Nurse Health Advisor in 1 year by calling [phone].   Preventive Care 49 Years and Older,  Female Preventive care refers to lifestyle choices and visits with your health care provider that can promote health and wellness. What does preventive care include?  A yearly physical exam. This is also called an annual well check.  Dental exams once or twice a year.  Routine eye exams. Ask your health care provider how often you should have your eyes checked.  Personal lifestyle choices, including:  Daily care of your teeth and gums.  Regular physical activity.  Eating a healthy diet.  Avoiding tobacco and drug use.  Limiting alcohol use.  Practicing safe sex.  Taking low-dose aspirin every day.  Taking vitamin and mineral supplements as recommended by your health care provider. What happens during an annual well check? The services and screenings done by your health care provider during your annual well check will depend on your age, overall health, lifestyle risk factors, and family history of disease. Counseling  Your health care provider may ask you questions about your:  Alcohol use.  Tobacco use.  Drug use.  Emotional well-being.  Home and relationship well-being.  Sexual activity.  Eating habits.  History of falls.  Memory and ability to understand (cognition).  Work and work Statistician.  Reproductive health. Screening  You may have the following tests or measurements:  Height, weight, and BMI.  Blood pressure.  Lipid and cholesterol levels. These may be checked every 5 years, or more frequently if you are over 11 years old.  Skin check.  Lung cancer screening. You may have this screening every year starting at age 24 if you have a 30-pack-year history of smoking and currently smoke or have quit within the past 15 years.  Fecal occult blood test (FOBT) of the stool. You may have this test every year starting at age 27.  Flexible sigmoidoscopy or colonoscopy. You  may have a sigmoidoscopy every 5 years or a colonoscopy every 10 years  starting at age 2.  Hepatitis C blood test.  Hepatitis B blood test.  Sexually transmitted disease (STD) testing.  Diabetes screening. This is done by checking your blood sugar (glucose) after you have not eaten for a while (fasting). You may have this done every 1-3 years.  Bone density scan. This is done to screen for osteoporosis. You may have this done starting at age 61.  Mammogram. This may be done every 1-2 years. Talk to your health care provider about how often you should have regular mammograms. Talk with your health care provider about your test results, treatment options, and if necessary, the need for more tests. Vaccines  Your health care provider may recommend certain vaccines, such as:  Influenza vaccine. This is recommended every year.  Tetanus, diphtheria, and acellular pertussis (Tdap, Td) vaccine. You may need a Td booster every 10 years.  Zoster vaccine. You may need this after age 59.  Pneumococcal 13-valent conjugate (PCV13) vaccine. One dose is recommended after age 43.  Pneumococcal polysaccharide (PPSV23) vaccine. One dose is recommended after age 9. Talk to your health care provider about which screenings and vaccines you need and how often you need them. This information is not intended to replace advice given to you by your health care provider. Make sure you discuss any questions you have with your health care provider. Document Released: 06/14/2015 Document Revised: 02/05/2016 Document Reviewed: 03/19/2015 Elsevier Interactive Patient Education  2017 [place] Prevention in the Home Falls can cause injuries. They can happen to people of all ages. There are many things you can do to make your home safe and to help prevent falls. What can I do on the outside of my home?  Regularly fix the edges of walkways and driveways and fix any cracks.  Remove anything that might make you trip as you walk through a door, such as a raised step or  threshold.  Trim any bushes or trees on the path to your home.  Use bright outdoor lighting.  Clear any walking paths of anything that might make someone trip, such as rocks or tools.  Regularly check to see if handrails are loose or broken. Make sure that both sides of any steps have handrails.  Any raised decks and porches should have guardrails on the edges.  Have any leaves, snow, or ice cleared regularly.  Use sand or salt on walking paths during winter.  Clean up any spills in your garage right away. This includes oil or grease spills. What can I do in the bathroom?  Use night lights.  Install grab bars by the toilet and in the tub and shower. Do not use towel bars as grab bars.  Use non-skid mats or decals in the tub or shower.  If you need to sit down in the shower, use a plastic, non-slip stool.  Keep the floor dry. Clean up any water that spills on the floor as soon as it happens.  Remove soap buildup in the tub or shower regularly.  Attach bath mats securely with double-sided non-slip rug tape.  Do not have throw rugs and other things on the floor that can make you trip. What can I do in the bedroom?  Use night lights.  Make sure that you have a light by your bed that is easy to reach.  Do not use any sheets or blankets that are too big for  your bed. They should not hang down onto the floor.  Have a firm chair that has side arms. You can use this for support while you get dressed.  Do not have throw rugs and other things on the floor that can make you trip. What can I do in the kitchen?  Clean up any spills right away.  Avoid walking on wet floors.  Keep items that you use a lot in easy-to-reach places.  If you need to reach something above you, use a strong step stool that has a grab bar.  Keep electrical cords out of the way.  Do not use floor polish or wax that makes floors slippery. If you must use wax, use non-skid floor wax.  Do not have  throw rugs and other things on the floor that can make you trip. What can I do with my stairs?  Do not leave any items on the stairs.  Make sure that there are handrails on both sides of the stairs and use them. Fix handrails that are broken or loose. Make sure that handrails are as long as the stairways.  Check any carpeting to make sure that it is firmly attached to the stairs. Fix any carpet that is loose or worn.  Avoid having throw rugs at the top or bottom of the stairs. If you do have throw rugs, attach them to the floor with carpet tape.  Make sure that you have a light switch at the top of the stairs and the bottom of the stairs. If you do not have them, ask someone to add them for you. What else can I do to help prevent falls?  Wear shoes that:  Do not have high heels.  Have rubber bottoms.  Are comfortable and fit you well.  Are closed at the toe. Do not wear sandals.  If you use a stepladder:  Make sure that it is fully opened. Do not climb a closed stepladder.  Make sure that both sides of the stepladder are locked into place.  Ask someone to hold it for you, if possible.  Clearly mark and make sure that you can see:  Any grab bars or handrails.  First and last steps.  Where the edge of each step is.  Use tools that help you move around (mobility aids) if they are needed. These include:  Canes.  Walkers.  Scooters.  Crutches.  Turn on the lights when you go into a dark area. Replace any light bulbs as soon as they burn out.  Set up your furniture so you have a clear path. Avoid moving your furniture around.  If any of your floors are uneven, fix them.  If there are any pets around you, be aware of where they are.  Review your medicines with your doctor. Some medicines can make you feel dizzy. This can increase your chance of falling. Ask your doctor what other things that you can do to help prevent falls. This information is not intended to  replace advice given to you by your health care provider. Make sure you discuss any questions you have with your health care provider. Document Released: 03/14/2009 Document Revised: 10/24/2015 Document Reviewed: 06/22/2014 Elsevier Interactive Patient Education  2017 [employer].

## 2020-09-26 NOTE — Progress Notes (Addendum)
Subjective:   Mariah Farrell is a 80 y.o. female who presents for Medicare Annual (Subsequent) preventive examination.  Review of Systems    No ROS. Medicare Wellness Visit. Additional risk factors are reflected in social history. Cardiac Risk Factors include: advanced age (>75men, >53 women);diabetes mellitus;hypertension;dyslipidemia;obesity (BMI >30kg/m2)     Objective:    Today's Vitals   09/26/20 1132  BP: 110/70  Pulse: 80  Resp: 16  Temp: 97.6 F (36.4 C)  SpO2: 93%  Weight: 185 lb 9.6 oz (84.2 kg)  PainSc: 0-No pain   Body mass index is 33.95 kg/m.  Advanced Directives 09/26/2020 09/10/2015 07/26/2014 07/23/2014 02/16/2014 08/01/2013 07/24/2013  Does Patient Have a Medical Advance Directive? Yes Yes Yes Yes Yes Patient has advance directive, copy not in chart Patient has advance directive, copy not in chart  Type of Advance Directive Living will;Healthcare Power of [place];Living will Living will [place];Living will [place];Living will  Does patient want to make changes to medical advance directive? No - Patient declined - - No - Patient declined No - Patient declined - No change requested  Copy of [place] in Chart? No - copy requested - - - No - copy requested Copy requested from family Copy requested from family  Pre-existing out of facility DNR order (yellow form or pink MOST form) - - - - - No No    Current Medications (verified) Outpatient Encounter Medications as of 09/26/2020  Medication Sig   calcitonin, salmon, (MIACALCIN/FORTICAL) 200 UNIT/ACT nasal spray PLACE 1 SPRAY INTO ALTERNATING NOSTRILS DAILY   calcium-vitamin D (OSCAL WITH D) 500-200 MG-UNIT per tablet Take 1 tablet by mouth daily.   celecoxib (CELEBREX) 100 MG capsule Take 1 capsule (100 mg total) by mouth daily as needed for moderate pain.   cetirizine (ZYRTEC) 10 MG tablet Take 10 mg by mouth  daily.   Cholecalciferol (VITAMIN D3) 2000 units capsule Take 1 capsule (2,000 Units total) by mouth daily.   diazepam (VALIUM) 5 MG tablet TAKE 1/2 TO 1 TABLET BY MOUTH EVERY 12 HOURS AS NEEDED FOR ANXIETY   escitalopram (LEXAPRO) 5 MG tablet TAKE ONE TABLET BY MOUTH DAILY   gabapentin (NEURONTIN) 300 MG capsule TAKE ONE CAPSULE BY MOUTH EVERY NIGHT AT BEDTIME   glucosamine-chondroitin 500-400 MG tablet Take 1 tablet by mouth daily.   levothyroxine (SYNTHROID) 25 MCG tablet TAKE ONE TABLET BY MOUTH DAILY BEFORE BREAKFAST   Multiple Vitamin (MULTIVITAMIN) tablet Take 1 tablet by mouth daily.   pantoprazole (PROTONIX) 40 MG tablet Take 1 tablet (40 mg total) by mouth daily.   vitamin C (ASCORBIC ACID) 500 MG tablet Take 1,000 mg by mouth daily.    Biotin 5000 MCG TABS Take 1 tablet by mouth daily.   cephALEXin (KEFLEX) 500 MG capsule Take 1 capsule (500 mg total) by mouth 4 (four) times daily. (Patient not taking: Reported on 09/26/2020)   methylPREDNISolone (MEDROL) 4 MG tablet Take 1 tablet (4 mg total) by mouth daily for 14 days, THEN 0.5 tablets (2 mg total) daily for 14 days. (Patient not taking: Reported on 09/26/2020)   predniSONE (DELTASONE) 5 MG tablet As directed (Patient not taking: Reported on 09/26/2020)   timolol (TIMOPTIC) 0.5 % ophthalmic solution  (Patient not taking: Reported on 09/26/2020)   No facility-administered encounter medications on file as of 09/26/2020.    Allergies (verified) Doxycycline, Crab [shellfish allergy], Nexium [esomeprazole magnesium], Penicillins, and Prolia [denosumab]  History: Past Medical History:  Diagnosis Date   Arthritis    Cancer ([place]) 13 years ago   bladder   GERD (gastroesophageal reflux disease)    Goiter    synthroid used to shrink   H/O hiatal hernia    Headache(784.0)    migraines in morning   PONV (postoperative nausea and vomiting)    Seasonal allergies    Past Surgical History:  Procedure Laterality Date   ARTHRODESIS FOOT  WITH WEIL OSTEOTOMY Right 07/26/2014   Procedure: RIGHT HALLUX METATARSOPHALANGEAL JOINT ARTHRODESIS; RIGHT SECOND METATARSAL WEIL OSTEOTOMY; DORSAL CAPSULOTOMY EXTENSOR TENDON LENGTHENING;  Surgeon: Wylene Simmer, MD;  Location: [facility];  Service: Orthopedics;  Laterality: Right;   BLADDER SURGERY  13 years ago   BUNIONECTOMY Bilateral    COLONOSCOPY     LUMBAR FUSION  May 2014   L4-L5   ROTATOR CUFF REPAIR Bilateral ~2010, 2012   SPINAL FUSION     L1, L2   TONSILLECTOMY  as child   TOTAL KNEE ARTHROPLASTY Right 07/19/2012   Procedure: TOTAL KNEE ARTHROPLASTY;  Surgeon: Mauri Pole, MD;  Location: [facility];  Service: Orthopedics;  Laterality: Right;   TOTAL KNEE ARTHROPLASTY Left 08/01/2013   Procedure: LEFT TOTAL KNEE ARTHROPLASTY;  Surgeon: Mauri Pole, MD;  Location: [facility];  Service: Orthopedics;  Laterality: Left;   Family History  Problem Relation Age of Onset   Cancer Brother 64       pancreatic   Brain cancer Father    Social History   Socioeconomic History   Marital status: Married    Spouse name: Not on file   Number of children: Not on file   Years of education: Not on file   Highest education level: Not on file  Occupational History   Not on file  Tobacco Use   Smoking status: Former Smoker    Packs/day: 0.25    Years: 5.00    Pack years: 1.25    Types: Cigarettes    Quit date: 06/01/1964    Years since quitting: 56.3   Smokeless tobacco: Never Used  Vaping Use   Vaping Use: Never used  Substance and Sexual Activity   Alcohol use: Yes    Comment: 1-2 times weekly   Drug use: No   Sexual activity: Not on file  Other Topics Concern   Not on file  Social History Narrative   Not on file   Social Determinants of Health   Financial Resource Strain: Low Risk    Difficulty of Paying Living Expenses: Not hard at all  Food Insecurity: No Food Insecurity   Worried About Charity fundraiser in the Last Year: Never true   [place] in the  Last Year: Never true  Transportation Needs: No Transportation Needs   Lack of Transportation (Medical): No   Lack of Transportation (Non-Medical): No  Physical Activity: Sufficiently Active   Days of Exercise per Week: 5 days   Minutes of Exercise per Session: 30 min  Stress: No Stress Concern Present   Feeling of Stress : Not at all  Social Connections: Socially Integrated   Frequency of Communication with Friends and Family: More than three times a week   Frequency of Social Gatherings with Friends and Family: Twice a week   Attends Religious Services: More than 4 times per year   Active Member of [employer] or Organizations: Yes   Attends Archivist Meetings: More than 4 times per year  Marital Status: Married    Tobacco Counseling Counseling given: Not Answered   Clinical Intake:  Pre-visit preparation completed: Yes  Pain : No/denies pain Pain Score: 0-No pain     BMI - recorded: 33.95 Nutritional Status: BMI > 30  Obese Nutritional Risks: None Diabetes: Yes CBG done?: No Did pt. bring in CBG monitor from home?: No  How often do you need to have someone help you when you read instructions, pamphlets, or other written materials from your doctor or pharmacy?: 1 - Never What is the last grade level you completed in school?: 2 years of college  Diabetic? yes  Interpreter Needed?: No  Information entered by :: Lisette Abu, LPN   Activities of Daily Living In your present state of health, do you have any difficulty performing the following activities: 09/26/2020  Hearing? N  Vision? N  Difficulty concentrating or making decisions? N  Walking or climbing stairs? N  Dressing or bathing? N  Doing errands, shopping? N  Preparing Food and eating ? N  Using the Toilet? N  In the past six months, have you accidently leaked urine? N  Do you have problems with loss of bowel control? N  Managing your Medications? N  Managing your Finances? N  Housekeeping  or managing your Housekeeping? N  Some recent data might be hidden    Patient Care Team: Plotnikov, Evie Lacks, MD as PCP - General Paralee Cancel, MD (Orthopedic Surgery) Newton Pigg, MD (Obstetrics and Gynecology) Richmond Campbell, MD (Gastroenterology) Roseanne Kaufman, MD (Orthopedic Surgery)  Indicate any recent Medical Services you may have received from other than [facility] providers in the past year (date may be approximate).     Assessment:   This is a routine wellness examination for Javiana.  Hearing/Vision screen No exam data present  Dietary issues and exercise activities discussed: Current Exercise Habits: Home exercise routine, Time (Minutes): 30, Frequency (Times/Week): 5, Weekly Exercise (Minutes/Week): 150, Intensity: Moderate, Exercise limited by: orthopedic condition(s);respiratory conditions(s)  Goals      Weight (lb) < 200 lb (90.7 kg)     Weight (lb) < 200 lb (90.7 kg)     Cut out sugar;        Depression Screen PHQ 2/9 Scores 09/26/2020 06/20/2020 01/04/2020 03/29/2018 03/24/2017 03/04/2016 03/04/2016  PHQ - 2 Score 0 0 0 0 0 0 0    Fall Risk Fall Risk  09/26/2020 06/20/2020 01/04/2020 03/29/2018 03/24/2017  Falls in the past year? 0 0 0 No No  Number falls in past yr: 0 0 0 - -  Injury with Fall? 0 0 0 - -  Risk for fall due to : No Fall Risks - - - -  Follow up Falls evaluation completed - - - -    FALL RISK PREVENTION PERTAINING TO THE HOME:  Any stairs in or around the home? Yes  If so, are there any without handrails? No  Home free of loose throw rugs in walkways, pet beds, electrical cords, etc? Yes  Adequate lighting in your home to reduce risk of falls? Yes   ASSISTIVE DEVICES UTILIZED TO PREVENT FALLS:  Life alert? No  Use of a cane, walker or w/c? No  Grab bars in the bathroom? Yes  Shower chair or bench in shower? Yes  Elevated toilet seat or a handicapped toilet? Yes   TIMED UP AND GO:  Was the test performed? No .  Length of time to  ambulate 10 feet: 0 sec.   Gait steady and  fast without use of assistive device  Cognitive Function: Normal cognitive status assessed by direct observation by this Nurse Health Advisor. No abnormalities found.   MMSE - Mini Mental State Exam 03/04/2016  Not completed: (No Data)        Immunizations Immunization History  Administered Date(s) Administered   Fluad Quad(high Dose 65+) 03/01/2020   H1N1 05/07/2008   Influenza Split 04/01/2012   Influenza Whole 03/04/2010   Influenza, High Dose Seasonal PF 03/04/2016, 03/10/2017, 02/22/2018, 01/27/2019   Influenza,inj,Quad PF,6+ Mos 02/20/2014   Influenza,inj,quad, With Preservative 03/01/2017   PFIZER Comirnaty(Gray Top)Covid-19 Tri-Sucrose Vaccine 09/18/2020   PFIZER(Purple Top)SARS-COV-2 Vaccination 06/26/2019, 07/17/2019   Pneumococcal Conjugate-13 12/18/2013   Pneumococcal Polysaccharide-23 03/11/2006, 04/03/2019   Td 02/11/2006   Tdap 03/04/2016   Zoster 11/07/2008   Zoster Recombinat (Shingrix) 02/22/2018, 04/25/2018    TDAP status: Up to date  Flu Vaccine status: Up to date  Pneumococcal vaccine status: Up to date  Covid-19 vaccine status: Completed vaccines  Qualifies for Shingles Vaccine? Yes   Zostavax completed Yes   Shingrix Completed?: Yes  Screening Tests Health Maintenance  Topic Date Due   INFLUENZA VACCINE  12/30/2020   TETANUS/TDAP  03/04/2026   DEXA SCAN  Completed   COVID-19 Vaccine  Completed   PNA vac Low Risk Adult  Completed   HPV VACCINES  Aged Out    Health Maintenance  There are no preventive care reminders to display for this patient.  Colorectal cancer screening: No longer required.   Mammogram status: Completed 09/12/2020. Repeat every year   Bone Density status: Completed 12/24/2015. Results reflect: Bone density results: OSTEOPOROSIS. Repeat every 2 years. (completed, no longer recommended)  Lung Cancer Screening: (Low Dose CT Chest recommended if Age 2-80 years, 30 pack-year  currently smoking OR have quit w/in 15years.) does not qualify.   Lung Cancer Screening Referral: no  Additional Screening:  Hepatitis C Screening: does not qualify; Completed no  Vision Screening: Recommended annual ophthalmology exams for early detection of glaucoma and other disorders of the eye. Is the patient up to date with their annual eye exam?  Yes  Who is the provider or what is the name of the office in which the patient attends annual eye exams? Maris Berger, MD. If pt is not established with a provider, would they like to be referred to a provider to establish care? No .   Dental Screening: Recommended annual dental exams for proper oral hygiene  Community Resource Referral / Chronic Care Management: CRR required this visit?  No   CCM required this visit?  No      Plan:     I have personally reviewed and noted the following in the patient's chart:   Medical and social history Use of alcohol, tobacco or illicit drugs  Current medications and supplements Functional ability and status Nutritional status Physical activity Advanced directives List of other physicians Hospitalizations, surgeries, and ER visits in previous 12 months Vitals Screenings to include cognitive, depression, and falls Referrals and appointments  In addition, I have reviewed and discussed with patient certain preventive protocols, quality metrics, and best practice recommendations. A written personalized care plan for preventive services as well as general preventive health recommendations were provided to patient.     Mickeal Needy, LPN   9/38/1017   Nurse Notes:  Medications reviewed with patient; no opioid use noted.   Medical screening examination/treatment/procedure(s) were performed by non-physician practitioner and as supervising physician I was immediately available for consultation/collaboration.  I agree with above. Lew Dawes, MD

## 2020-09-30 ENCOUNTER — Encounter: Payer: Self-pay | Admitting: Physical Therapy

## 2020-09-30 ENCOUNTER — Other Ambulatory Visit: Payer: Self-pay

## 2020-09-30 ENCOUNTER — Ambulatory Visit: Payer: Medicare HMO | Admitting: Physical Therapy

## 2020-09-30 DIAGNOSIS — M79601 Pain in right arm: Secondary | ICD-10-CM | POA: Diagnosis not present

## 2020-09-30 DIAGNOSIS — G8929 Other chronic pain: Secondary | ICD-10-CM

## 2020-09-30 DIAGNOSIS — M6281 Muscle weakness (generalized): Secondary | ICD-10-CM

## 2020-09-30 DIAGNOSIS — M542 Cervicalgia: Secondary | ICD-10-CM | POA: Diagnosis not present

## 2020-09-30 NOTE — Patient Instructions (Signed)
Access Code: [email or web address] URL: [email or web address] Date: 09/30/2020 Prepared by: Kearney Hard  Exercises Standing Shoulder Row with Anchored Resistance - 2 x daily - 7 x weekly - 2 sets - 10 reps Supine Shoulder External Rotation with [place] - 2 x daily - 7 x weekly - 2 sets - 10 reps - 5 seonds hold Seated Cervical Rotation AROM - 2 x daily - 7 x weekly - 5 reps - 5 seconds hold Seated Upper Trap Stretch - 2 x daily - 7 x weekly - 5 reps - 5 seconds hold

## 2020-09-30 NOTE — Therapy (Signed)
[facility] [address] [place], [place], [address] Phone: [phone]   Fax:  [phone]  Physical Therapy Evaluation  Patient Details  Name: Mariah Farrell MRN: [id number] Date of Birth: [birth year] Referring Provider (PT): Basil Dess, MD   Encounter Date: 09/30/2020   PT End of Session - 09/30/20 1158    Visit Number 1    Number of Visits 8    Date for PT Re-Evaluation 12/06/20    Progress Note Due on Visit 10    PT Start Time 6789    PT Stop Time 1100    PT Time Calculation (min) 45 min    Activity Tolerance Patient tolerated treatment well    Behavior During Therapy [facility] for tasks assessed/performed           Past Medical History:  Diagnosis Date  . Arthritis   . Cancer ([place]) 13 years ago   bladder  . GERD (gastroesophageal reflux disease)   . Goiter    synthroid used to shrink  . H/O hiatal hernia   . Headache(784.0)    migraines in morning  . PONV (postoperative nausea and vomiting)   . Seasonal allergies     Past Surgical History:  Procedure Laterality Date  . ARTHRODESIS FOOT WITH WEIL OSTEOTOMY Right 07/26/2014   Procedure: RIGHT HALLUX METATARSOPHALANGEAL JOINT ARTHRODESIS; RIGHT SECOND METATARSAL WEIL OSTEOTOMY; DORSAL CAPSULOTOMY EXTENSOR TENDON LENGTHENING;  Surgeon: Wylene Simmer, MD;  Location: [place];  Service: Orthopedics;  Laterality: Right;  . BLADDER SURGERY  13 years ago  . BUNIONECTOMY Bilateral   . COLONOSCOPY    . LUMBAR FUSION  May 2014   L4-L5  . ROTATOR CUFF REPAIR Bilateral ~2010, 2012  . SPINAL FUSION     L1, L2  . TONSILLECTOMY  as child  . TOTAL KNEE ARTHROPLASTY Right 07/19/2012   Procedure: TOTAL KNEE ARTHROPLASTY;  Surgeon: Mauri Pole, MD;  Location: [facility];  Service: Orthopedics;  Laterality: Right;  . TOTAL KNEE ARTHROPLASTY Left 08/01/2013   Procedure: LEFT TOTAL KNEE ARTHROPLASTY;  Surgeon: Mauri Pole, MD;  Location: [facility];  Service: Orthopedics;   Laterality: Left;    There were no vitals filed for this visit.    Subjective Assessment - 09/30/20 1020    Subjective Pt arriving to therpay reporting several month history of neck pain. Pt did state that over the last 2-3 days her pain has improved. Pt s/p cervical injection at C6 and C8. Pt reporting the injection at C8 helped about 1 week and then her pain returned. Pt reporting after her pain was not relieved after the injection at C6.    Pertinent History osteoporosis, s/p left TKA, obesity, chronic back pain, right breast mass, depression, DOE, renal insufficiency    How long can you sit comfortably? it varies    Patient Stated Goals Stop hurting    Currently in Pain? No/denies              [facility] PT Assessment - 09/30/20 0001      Assessment   Medical Diagnosis M54.2 cervicalgia, spinal stenosis, M48.02, numbness down right arm R20.0    Referring Provider (PT) Basil Dess, MD    Onset Date/Surgical Date --   about 6 months   Hand Dominance Right    Prior Therapy not for current condition      Precautions   Precautions Other (comment)    Precaution Comments No lifting > 5 pounds      Restrictions  Weight Bearing Restrictions No      Balance Screen   Has the patient fallen in the past 6 months No    Is the patient reluctant to leave their home because of a fear of falling?  No      Home Environment   Living Environment Private residence    Living Arrangements Spouse/significant other    Type of [place]      Prior Function   Level of [place] Retired      Associate Professor   Overall Cognitive Status Within Functional Limits for tasks assessed      Observation/Other Assessments   Focus on Therapeutic Outcomes (FOTO)  59 (predicted 63)      Posture/Postural Control   Posture/Postural Control Postural limitations    Postural Limitations Rounded Shoulders;Forward head;Increased thoracic kyphosis    Posture Comments increased shoulder  hiking bilaterally, dowagers hump      ROM / Strength   AROM / PROM / Strength AROM;Strength      AROM   AROM Assessment Site Shoulder;Cervical    Right/Left Shoulder Right;Left    Right Shoulder Extension 70 Degrees    Right Shoulder Flexion 138 Degrees    Right Shoulder ABduction 162 Degrees    Right Shoulder Internal Rotation --   thumb to T8   Right Shoulder External Rotation 65 Degrees    Left Shoulder Extension 70 Degrees    Left Shoulder Flexion 160 Degrees    Left Shoulder ABduction 166 Degrees    Left Shoulder Internal Rotation --   Thumb to T4   Cervical Flexion 12    Cervical Extension 8    Cervical - Right Side Bend 12    Cervical - Left Side Bend 10    Cervical - Right Rotation 44    Cervical - Left Rotation 48      Strength   Strength Assessment Site Shoulder    Right/Left Shoulder Right;Left    Right Shoulder Flexion 4+/5    Right Shoulder Extension 4/5    Right Shoulder ABduction 4/5    Right Shoulder Internal Rotation 4/5    Right Shoulder External Rotation 4/5    Left Shoulder Flexion 4+/5    Left Shoulder Extension 4+/5    Left Shoulder ABduction 4+/5    Left Shoulder Internal Rotation 4+/5    Left Shoulder External Rotation 4+/5      Palpation   Palpation comment TTP, in right upper trap and cerical paraspinals                      Objective measurements completed on examination: See above findings.       [facility] Adult PT Treatment/Exercise - 09/30/20 0001      Exercises   Exercises Neck;Shoulder      Shoulder Exercises: Standing   Row 10 reps;Theraband    Theraband Level (Shoulder Row) Level 2 (Red)      Manual Therapy   Manual therapy comments active trigger point release to right upper trap, percussion to bilateral upper traps x 8 minutes      Neck Exercises: Stretches   Upper Trapezius Stretch Right;Left;2 reps;10 seconds    Levator Stretch Right;Left;2 reps;10 seconds    Other Neck Stretches cerical rotation x 3 holding  5 seconds each                  PT Education - 09/30/20 1157    Education Details PT POC, HEP  Person(s) Educated Patient    Methods Explanation;Demonstration;Handout;Tactile cues    Comprehension Verbalized understanding;Returned demonstration            PT Short Term Goals - 09/30/20 1250      PT SHORT TERM GOAL #1   Title Pt will be inependent in her initial HEP.    Time 3    Period Weeks    Status New    Target Date 10/21/20             PT Long Term Goals - 09/30/20 1251      PT LONG TERM GOAL #1   Title Pt will be able to report pain </= 3/10 radiating down her right upper extremity with ADL's.    Time 8    Period Weeks    Status New    Target Date 12/06/20      PT LONG TERM GOAL #2   Title Pt will be able to improve bilateral cervical rotation to >/= 60 degrees to improve functional mobility and driving safety.    Time 8    Period Weeks    Status New    Target Date 12/06/20      PT LONG TERM GOAL #3   Title Pt will improve her FOTO from 73 to >/= 63/    Baseline 59 on 09/30/2020    Time 8    Period Weeks    Status New    Target Date 12/06/20      PT LONG TERM GOAL #4   Title Pt will improve her right shoulder flexion to >/= 160 degrees for increased functional mobility.    Time 8    Period Weeks    Status New    Target Date 12/06/20                  Plan - 09/30/20 1618    Clinical Impression Statement Pt arriving to therpay reporting several month history of neck pain. Pt reporting over the last few days her pain has been much better and is reporting no pain today. Pt presenting with limited cervical ROM, and decreased shoulder strength. Pt was issued a HEP and percussion was performed to right upper trap over active trigger points. Pt would benefit from skilled PT 1x/ week for 8 weeks to address her  impairments with below interventions.    Personal Factors and Comorbidities Comorbidity 3+    Comorbidities osteoporosis, s/p Left  TKA, obesity, chronic back pain, right breast mass, depression, DOE, renal insufficiency    Examination-Activity Limitations Carry;Lift;Reach Overhead;Sleep;Other    Examination-Participation Restrictions Community Activity;Other    Stability/Clinical Decision Making Stable/Uncomplicated    Clinical Decision Making Low    Rehab Potential Good    PT Frequency 1x / week    PT Duration 8 weeks    PT Treatment/Interventions ADLs/Self Care Home Management;Cryotherapy;Electrical Stimulation;Moist Heat;Traction;Ultrasound;Balance training;Therapeutic exercise;Therapeutic activities;Functional mobility training;Neuromuscular re-education;Patient/family education;Manual techniques;Passive range of motion;Dry needling;Taping    PT Next Visit Plan trigger point release upper traps, STM vs percussion as needed, cervical stretching, shoulder ROM/strengthening    PT Home Exercise Plan [id number]    Consulted and Agree with Plan of Care Patient           Patient will benefit from skilled therapeutic intervention in order to improve the following deficits and impairments:  Obesity,Pain,Postural dysfunction,Decreased strength,Decreased range of motion,Impaired UE functional use  Visit Diagnosis: Cervicalgia  Pain in right arm     Problem List Patient Active Problem List  Diagnosis Date Noted  . DOE (dyspnea on exertion) 06/20/2020  . Ingrown toenail of right foot 06/20/2020  . Situational depression 01/04/2020  . Renal insufficiency 05/10/2019  . Breast mass, right 08/10/2017  . 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 Stress at home 03/24/2017  . Pre-operative examination for internal medicine 10/06/2016  . RLQ abdominal pain 05/16/2016  . Need for prophylactic vaccination and inoculation against influenza 03/04/2016  . Allergic rhinitis 02/20/2014  . Osteoporosis 12/18/2013  . Expected blood loss anemia 07/20/2012  . Obesity, unspecified 07/20/2012  . S/P left TKA  07/19/2012  . Tinnitus 09/28/2011  . Well adult exam 09/28/2011  . Knee pain 09/28/2011  . Goiter 04/18/2009  . TOBACCO USE, QUIT 04/18/2009  . OSTEOARTHRITIS 04/11/2007  . Lumbar back pain 04/11/2007  . COLONIC POLYPS, HX OF 04/11/2007    Oretha Caprice, PT, MPT 09/30/2020, 4:30 PM  [facility] [address] [place], [place], [address] Phone: [phone]   Fax:  [phone]  Name: Kiearra Oyervides MRN: [id number] Date of Birth: 19-Jan-1941

## 2020-10-01 DIAGNOSIS — H401133 Primary open-angle glaucoma, bilateral, severe stage: Secondary | ICD-10-CM | POA: Diagnosis not present

## 2020-10-09 ENCOUNTER — Encounter: Payer: Medicare HMO | Admitting: Rehabilitative and Restorative Service Providers"

## 2020-10-10 ENCOUNTER — Ambulatory Visit: Payer: Medicare HMO | Admitting: Rehabilitative and Restorative Service Providers"

## 2020-10-10 ENCOUNTER — Other Ambulatory Visit: Payer: Self-pay

## 2020-10-10 ENCOUNTER — Encounter: Payer: Self-pay | Admitting: Rehabilitative and Restorative Service Providers"

## 2020-10-10 DIAGNOSIS — M6281 Muscle weakness (generalized): Secondary | ICD-10-CM

## 2020-10-10 DIAGNOSIS — G8929 Other chronic pain: Secondary | ICD-10-CM | POA: Diagnosis not present

## 2020-10-10 DIAGNOSIS — M542 Cervicalgia: Secondary | ICD-10-CM | POA: Diagnosis not present

## 2020-10-10 DIAGNOSIS — M79601 Pain in right arm: Secondary | ICD-10-CM

## 2020-10-10 DIAGNOSIS — M25511 Pain in right shoulder: Secondary | ICD-10-CM

## 2020-10-10 NOTE — Therapy (Signed)
[facility] [address] [place], [place], [address] Phone: [phone]   Fax:  [phone]  Physical Therapy Treatment/Discharge  Patient Details  Name: Mariah Farrell MRN: [id number] Date of Birth: April 04, 1941 Referring Provider (PT): Basil Dess, MD   Encounter Date: 10/10/2020   PHYSICAL THERAPY DISCHARGE SUMMARY  Visits from Start of Care: 2  Current functional level related to goals / functional outcomes: See note   Remaining deficits: See note   Education / Equipment: HEP Plan: Patient agrees to discharge.  Patient goals were partially met. Patient is being discharged due to being pleased with the current functional level.  ?????        PT End of Session - 10/10/20 1214    Visit Number 2    Number of Visits 8    Date for PT Re-Evaluation 12/06/20    Progress Note Due on Visit 10    PT Start Time 1147    PT Stop Time 1205    PT Time Calculation (min) 18 min    Activity Tolerance Patient tolerated treatment well    Behavior During Therapy WFL for tasks assessed/performed           Past Medical History:  Diagnosis Date  . Arthritis   . Cancer ([place]) 13 years ago   bladder  . GERD (gastroesophageal reflux disease)   . Goiter    synthroid used to shrink  . H/O hiatal hernia   . Headache(784.0)    migraines in morning  . PONV (postoperative nausea and vomiting)   . Seasonal allergies     Past Surgical History:  Procedure Laterality Date  . ARTHRODESIS FOOT WITH WEIL OSTEOTOMY Right 07/26/2014   Procedure: RIGHT HALLUX METATARSOPHALANGEAL JOINT ARTHRODESIS; RIGHT SECOND METATARSAL WEIL OSTEOTOMY; DORSAL CAPSULOTOMY EXTENSOR TENDON LENGTHENING;  Surgeon: Wylene Simmer, MD;  Location: [facility];  Service: Orthopedics;  Laterality: Right;  . BLADDER SURGERY  13 years ago  . BUNIONECTOMY Bilateral   . COLONOSCOPY    . LUMBAR FUSION  May 2014   L4-L5  . ROTATOR CUFF REPAIR Bilateral ~2010, 2012   . SPINAL FUSION     L1, L2  . TONSILLECTOMY  as child  . TOTAL KNEE ARTHROPLASTY Right 07/19/2012   Procedure: TOTAL KNEE ARTHROPLASTY;  Surgeon: Mauri Pole, MD;  Location: [facility];  Service: Orthopedics;  Laterality: Right;  . TOTAL KNEE ARTHROPLASTY Left 08/01/2013   Procedure: LEFT TOTAL KNEE ARTHROPLASTY;  Surgeon: Mauri Pole, MD;  Location: [facility];  Service: Orthopedics;  Laterality: Left;    There were no vitals filed for this visit.   Subjective Assessment - 10/10/20 1149    Subjective Pt. stated doing much better. No pain to report upon arrival.  Doing exercises at home.  Starting up with trainer soon.    Pertinent History osteoporosis, s/p left TKA, obesity, chronic back pain, right breast mass, depression, DOE, renal insufficiency    How long can you sit comfortably? it varies    Patient Stated Goals Stop hurting    Currently in Pain? No/denies              [facility] PT Assessment - 10/10/20 0001      Assessment   Medical Diagnosis M54.2 cervicalgia, spinal stenosis, M48.02, numbness down right arm R20.0    Referring Provider (PT) Basil Dess, MD      Observation/Other Assessments   Focus on Therapeutic Outcomes (FOTO)  66% update      Strength  Left Shoulder Flexion 4+/5    Left Shoulder Extension 4+/5    Left Shoulder ABduction 4+/5    Left Shoulder Internal Rotation 4+/5    Left Shoulder External Rotation 4+/5                         [facility] Adult PT Treatment/Exercise - 10/10/20 0001      Exercises   Exercises Other Exercises    Other Exercises  Review of existing HEP c additions per handout including tband shoulder extension standing, bilateral UE ER c scapular retraction.  Trial set of each performed.                    PT Short Term Goals - 10/10/20 1150      PT SHORT TERM GOAL #1   Title Pt will be inependent in her initial HEP.    Time 3    Period Weeks    Status Achieved    Target Date 10/21/20             PT  Long Term Goals - 10/10/20 1212      PT LONG TERM GOAL #1   Title Pt will be able to report pain </= 3/10 radiating down her right upper extremity with ADL's.    Baseline 10/10/2020: reported no pain today    Time 8    Period Weeks    Status Achieved      PT LONG TERM GOAL #2   Title Pt will be able to improve bilateral cervical rotation to >/= 60 degrees to improve functional mobility and driving safety.    Time 8    Period Weeks      PT LONG TERM GOAL #3   Title Pt will improve her FOTO from 75 to >/= 63/    Baseline 59 on 09/30/2020    Time 8    Period Weeks    Status Achieved      PT LONG TERM GOAL #4   Title Pt will improve her right shoulder flexion to >/= 160 degrees for increased functional mobility.    Time 8    Period Weeks                 Plan - 10/10/20 1213    Clinical Impression Statement Pt. returned to clinic reported no pain complaints and feeling pretty well off and had desire to switch to HEP at this time c goals of returning to personal training exercise as well.  See objective data including FOTO improvement as documented.  Acceptable knowledge of HEP indicated at this time.  Will plan to proceed with d/c to HEP at this time    Personal Factors and Comorbidities Comorbidity 3+    Comorbidities osteoporosis, s/p Left TKA, obesity, chronic back pain, right breast mass, depression, DOE, renal insufficiency    Examination-Activity Limitations Carry;Lift;Reach Overhead;Sleep;Other    Examination-Participation Restrictions Community Activity;Other    Stability/Clinical Decision Making Stable/Uncomplicated    PT Treatment/Interventions ADLs/Self Care Home Management;Cryotherapy;Electrical Stimulation;Moist Heat;Traction;Ultrasound;Balance training;Therapeutic exercise;Therapeutic activities;Functional mobility training;Neuromuscular re-education;Patient/family education;Manual techniques;Passive range of motion;Dry needling;Taping    PT Next Visit Plan D/c to HEP     PT Home Exercise Plan [id number]    Consulted and Agree with Plan of Care Patient           Patient will benefit from skilled therapeutic intervention in order to improve the following deficits and impairments:  Obesity,Pain,Postural dysfunction,Decreased strength,Decreased range of motion,Impaired UE  functional use  Visit Diagnosis: Cervicalgia  Muscle weakness (generalized)  Pain in right arm  Chronic right shoulder pain     Problem List Patient Active Problem List   Diagnosis Date Noted  . DOE (dyspnea on exertion) 06/20/2020  . Ingrown toenail of right foot 06/20/2020  . Situational depression 01/04/2020  . Renal insufficiency 05/10/2019  . Breast mass, right 08/10/2017  . 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 Stress at home 03/24/2017  . Pre-operative examination for internal medicine 10/06/2016  . RLQ abdominal pain 05/16/2016  . Need for prophylactic vaccination and inoculation against influenza 03/04/2016  . Allergic rhinitis 02/20/2014  . Osteoporosis 12/18/2013  . Expected blood loss anemia 07/20/2012  . Obesity, unspecified 07/20/2012  . S/P left TKA 07/19/2012  . Tinnitus 09/28/2011  . Well adult exam 09/28/2011  . Knee pain 09/28/2011  . Goiter 04/18/2009  . TOBACCO USE, QUIT 04/18/2009  . OSTEOARTHRITIS 04/11/2007  . Lumbar back pain 04/11/2007  . COLONIC POLYPS, HX OF 04/11/2007    Scot Jun, PT, DPT, OCS, ATC 10/10/20  12:15 PM    [facility] [address] [place], [place], [address] Phone: [phone]   Fax:  [phone]  Name: Mariah Farrell MRN: [id number] Date of Birth: April 22, 1941

## 2020-10-17 ENCOUNTER — Other Ambulatory Visit: Payer: Self-pay | Admitting: Internal Medicine

## 2020-10-17 NOTE — Telephone Encounter (Signed)
Check [place] registry last filled 04/20/2020.Marland KitchenJohny Farrell

## 2020-10-18 ENCOUNTER — Telehealth: Payer: Self-pay | Admitting: Physical Medicine and Rehabilitation

## 2020-10-18 NOTE — Telephone Encounter (Signed)
Pt needs her appt on the 3rd to be cancelled. I think she want to reschedule for when she gets back.

## 2020-10-21 NOTE — Telephone Encounter (Signed)
Called pt and she doesn't need the test. Closing referral

## 2020-10-23 ENCOUNTER — Ambulatory Visit: Payer: Medicare HMO | Admitting: Specialist

## 2020-10-24 ENCOUNTER — Other Ambulatory Visit: Payer: Self-pay | Admitting: Internal Medicine

## 2020-10-25 ENCOUNTER — Telehealth: Payer: Self-pay | Admitting: *Deleted

## 2020-10-25 NOTE — Telephone Encounter (Signed)
-----   Message from Sheral Flow, [place] sent at 8/34/6219  8:25 AM EDT ----- Regarding: RE: Anti-Depressant Medications Can you please contact patient for Dr. Camila Li?  Mignon Pine ----- Message ----- From: Cassandria Anger, MD Sent: 10/24/2020   7:58 AM EDT To: Sheral Flow, LPN Subject: RE: Anti-Depressant Medications                Yes please.  Thank you ----- Message ----- From: Sheral Flow, LPN Sent: 4/71/2527   4:29 PM EDT To: Cassandria Anger, MD Subject: Anti-Depressant Medications                    Patient wanted to know if she could ween or completely stop taking Lexapro because she was told that Cymbalta already has an anti-depressant in it.  Mignon Pine

## 2020-10-25 NOTE — Telephone Encounter (Signed)
Notified pt w/MD response.../lmb 

## 2020-11-01 ENCOUNTER — Encounter: Payer: Medicare HMO | Admitting: Physical Medicine and Rehabilitation

## 2020-11-07 ENCOUNTER — Other Ambulatory Visit: Payer: Self-pay | Admitting: Internal Medicine

## 2020-12-23 ENCOUNTER — Telehealth: Payer: Self-pay

## 2020-12-24 NOTE — Telephone Encounter (Signed)
No need for antivaral sx if mild sx.  What sx's does Mariah Farrell have? Can we sch VOV or tel visit today? OK 4:30 w/me Thx

## 2020-12-24 NOTE — Telephone Encounter (Signed)
Pt tested positive 7/25.  Pt states symptoms started 5-7 days ago: lingering cough & congestion.  Denies SOB at this time.  Pt advised to use OTC medication if needed for congestion & to keep herself hydrated.  Pt declines VV at this time, she just wanted to inform pt to see if there was anything else she should be doing.  Advised pt that if symptoms worsen or she develops SOB she should call for an appt or go to [facility] (depending on the time).  Pt also advised on quarantine guidelines. Pt verb understanding & has no additional ques/concerns at this time.

## 2020-12-24 NOTE — Telephone Encounter (Signed)
Msg sent to assistant

## 2020-12-28 ENCOUNTER — Other Ambulatory Visit: Payer: Self-pay | Admitting: Internal Medicine

## 2021-01-02 ENCOUNTER — Telehealth: Payer: Self-pay | Admitting: *Deleted

## 2021-01-02 NOTE — Telephone Encounter (Signed)
Order faxed back to [facility].Marland KitchenJohny Farrell

## 2021-01-02 NOTE — Telephone Encounter (Signed)
-----   Message from Marguarite Arbour sent at 12/31/2020  9:33 AM EDT ----- Teola Bradley order

## 2021-01-08 ENCOUNTER — Encounter: Payer: Self-pay | Admitting: Internal Medicine

## 2021-01-08 DIAGNOSIS — M8589 Other specified disorders of bone density and structure, multiple sites: Secondary | ICD-10-CM | POA: Diagnosis not present

## 2021-01-08 DIAGNOSIS — Z780 Asymptomatic menopausal state: Secondary | ICD-10-CM | POA: Diagnosis not present

## 2021-01-08 DIAGNOSIS — Z1231 Encounter for screening mammogram for malignant neoplasm of breast: Secondary | ICD-10-CM | POA: Diagnosis not present

## 2021-01-08 LAB — HM MAMMOGRAPHY

## 2021-01-08 LAB — HM DEXA SCAN

## 2021-01-13 ENCOUNTER — Telehealth: Payer: Self-pay | Admitting: *Deleted

## 2021-01-13 NOTE — Telephone Encounter (Signed)
-----   Message from Marguarite Arbour sent at 01/09/2021  4:10 PM EDT ----- Teola Bradley order

## 2021-01-13 NOTE — Telephone Encounter (Signed)
Abstracted bone density into chart.Marland KitchenJohny Farrell

## 2021-01-20 ENCOUNTER — Telehealth: Payer: Self-pay | Admitting: *Deleted

## 2021-01-20 NOTE — Telephone Encounter (Signed)
-----   Message from [place] sent at 01/17/2021  3:05 PM EDT ----- Mariah Farrell

## 2021-01-20 NOTE — Telephone Encounter (Signed)
Abstracted mammo.Marland KitchenJohny Chess

## 2021-01-23 ENCOUNTER — Encounter: Payer: Self-pay | Admitting: Internal Medicine

## 2021-01-23 DIAGNOSIS — N6489 Other specified disorders of breast: Secondary | ICD-10-CM | POA: Diagnosis not present

## 2021-01-23 DIAGNOSIS — R928 Other abnormal and inconclusive findings on diagnostic imaging of breast: Secondary | ICD-10-CM | POA: Diagnosis not present

## 2021-02-26 DIAGNOSIS — Z8551 Personal history of malignant neoplasm of bladder: Secondary | ICD-10-CM | POA: Diagnosis not present

## 2021-03-10 DIAGNOSIS — Z6835 Body mass index (BMI) 35.0-35.9, adult: Secondary | ICD-10-CM | POA: Diagnosis not present

## 2021-03-10 DIAGNOSIS — Z124 Encounter for screening for malignant neoplasm of cervix: Secondary | ICD-10-CM | POA: Diagnosis not present

## 2021-03-10 DIAGNOSIS — R350 Frequency of micturition: Secondary | ICD-10-CM | POA: Diagnosis not present

## 2021-03-10 DIAGNOSIS — Z01419 Encounter for gynecological examination (general) (routine) without abnormal findings: Secondary | ICD-10-CM | POA: Diagnosis not present

## 2021-03-11 ENCOUNTER — Ambulatory Visit: Payer: Medicare HMO | Attending: Internal Medicine

## 2021-03-11 ENCOUNTER — Other Ambulatory Visit (HOSPITAL_BASED_OUTPATIENT_CLINIC_OR_DEPARTMENT_OTHER): Payer: Self-pay

## 2021-03-11 DIAGNOSIS — Z23 Encounter for immunization: Secondary | ICD-10-CM

## 2021-03-11 MED ORDER — PFIZER COVID-19 VAC BIVALENT 30 MCG/0.3ML IM SUSP
INTRAMUSCULAR | 0 refills | Status: DC
  Filled 2021-03-11: qty 0.3, 1d supply, fill #0

## 2021-03-11 NOTE — Progress Notes (Signed)
   Covid-19 Vaccination Clinic  Name:  Mariah Farrell    MRN: [id number] DOB: 1941-03-11  03/11/2021  [name] was observed post Covid-19 immunization for 15 minutes without incident. She was provided with Vaccine Information Sheet and instruction to access the V-Safe system.   [name] was instructed to call 911 with any severe reactions post vaccine: Difficulty breathing  Swelling of face and throat  A fast heartbeat  A bad rash all over body  Dizziness and weakness

## 2021-04-08 DIAGNOSIS — H40053 Ocular hypertension, bilateral: Secondary | ICD-10-CM | POA: Diagnosis not present

## 2021-04-09 ENCOUNTER — Ambulatory Visit: Payer: Medicare HMO | Admitting: Nurse Practitioner

## 2021-04-10 ENCOUNTER — Encounter: Payer: Self-pay | Admitting: Internal Medicine

## 2021-04-10 ENCOUNTER — Ambulatory Visit (INDEPENDENT_AMBULATORY_CARE_PROVIDER_SITE_OTHER)
Admission: RE | Admit: 2021-04-10 | Discharge: 2021-04-10 | Disposition: A | Discharge status: Home / Self Care | Payer: Medicare HMO | Source: Ambulatory Visit | Attending: Internal Medicine | Admitting: Internal Medicine

## 2021-04-10 ENCOUNTER — Other Ambulatory Visit: Payer: Self-pay

## 2021-04-10 ENCOUNTER — Other Ambulatory Visit: Payer: Self-pay | Admitting: Internal Medicine

## 2021-04-10 ENCOUNTER — Ambulatory Visit (INDEPENDENT_AMBULATORY_CARE_PROVIDER_SITE_OTHER): Payer: Medicare HMO | Admitting: Internal Medicine

## 2021-04-10 DIAGNOSIS — K1379 Other lesions of oral mucosa: Secondary | ICD-10-CM

## 2021-04-10 DIAGNOSIS — R0609 Other forms of dyspnea: Secondary | ICD-10-CM

## 2021-04-10 DIAGNOSIS — R0602 Shortness of breath: Secondary | ICD-10-CM | POA: Diagnosis not present

## 2021-04-10 DIAGNOSIS — J309 Allergic rhinitis, unspecified: Secondary | ICD-10-CM | POA: Diagnosis not present

## 2021-04-10 DIAGNOSIS — R042 Hemoptysis: Secondary | ICD-10-CM | POA: Diagnosis not present

## 2021-04-10 DIAGNOSIS — R059 Cough, unspecified: Secondary | ICD-10-CM | POA: Diagnosis not present

## 2021-04-10 DIAGNOSIS — M1990 Unspecified osteoarthritis, unspecified site: Secondary | ICD-10-CM | POA: Diagnosis not present

## 2021-04-10 LAB — HEPATIC FUNCTION PANEL
ALT: 28 U/L (ref 0–35)
AST: 32 U/L (ref 0–37)
Albumin: 4.2 g/dL (ref 3.5–5.2)
Alkaline Phosphatase: 54 U/L (ref 39–117)
Bilirubin, Direct: 0.1 mg/dL (ref 0.0–0.3)
Total Bilirubin: 0.7 mg/dL (ref 0.2–1.2)
Total Protein: 6.7 g/dL (ref 6.0–8.3)

## 2021-04-10 LAB — BASIC METABOLIC PANEL
BUN: 22 mg/dL (ref 6–23)
CO2: 31 mEq/L (ref 19–32)
Calcium: 9.6 mg/dL (ref 8.4–10.5)
Chloride: 105 mEq/L (ref 96–112)
Creatinine, Ser: 1.24 mg/dL — ABNORMAL HIGH (ref 0.40–1.20)
GFR: 40.57 mL/min — ABNORMAL LOW (ref >60.00)
Glucose, Bld: 91 mg/dL (ref 70–99)
Potassium: 4.2 mEq/L (ref 3.5–5.1)
Sodium: 141 mEq/L (ref 135–145)

## 2021-04-10 LAB — CBC WITH DIFFERENTIAL/PLATELET
Basophils Absolute: 0.1 10*3/uL (ref 0.0–0.1)
Basophils Relative: 1.3 % (ref 0.0–3.0)
Eosinophils Absolute: 0.3 10*3/uL (ref 0.0–0.7)
Eosinophils Relative: 7.3 % — ABNORMAL HIGH (ref 0.0–5.0)
HCT: 38 % (ref 36.0–46.0)
Hemoglobin: 12.7 g/dL (ref 12.0–15.0)
Lymphocytes Relative: 18 % (ref 12.0–46.0)
Lymphs Abs: 0.8 10*3/uL (ref 0.7–4.0)
MCHC: 33.5 g/dL (ref 30.0–36.0)
MCV: 97.3 fl (ref 78.0–100.0)
Monocytes Absolute: 0.4 10*3/uL (ref 0.1–1.0)
Monocytes Relative: 8.5 % (ref 3.0–12.0)
Neutro Abs: 2.9 10*3/uL (ref 1.4–7.7)
Neutrophils Relative %: 64.9 % (ref 43.0–77.0)
Platelets: 243 10*3/uL (ref 150.0–400.0)
RBC: 3.91 Mil/uL (ref 3.87–5.11)
RDW: 13.4 % (ref 11.5–15.5)
WBC: 4.5 10*3/uL (ref 4.0–10.5)

## 2021-04-10 LAB — PROTIME-INR
INR: 1 ratio (ref 0.8–1.0)
Prothrombin Time: 11 s (ref 9.6–13.1)

## 2021-04-10 MED ORDER — AZITHROMYCIN 250 MG PO TABS: ORAL_TABLET | ORAL | 1 refills | Status: AC

## 2021-04-10 NOTE — Progress Notes (Signed)
Patient ID: Mariah Farrell, female   DOB: 1940-08-17, 80 y.o.   MRN: [id number]        Chief Complaint: follow blood on pillowcase        HPI:  Mariah Farrell is a 80 y.o. female here with c/o moderate sized pool of BRB on the pillowcase when she awoke, obviously came from her mouth (not nose or other) such as in drooling issue; pt is s/p covid infection July 2022 and has intermittent non prod cough since then; had an unusual fall about 1 mo ago with a sore back mild intermittent since then, but then had onset of symptom above yesterday AM without HA, fever, chills, ST, worsening cough, and Pt denies chest pain, increased sob or doe, wheezing, orthopnea, PND, increased LE swelling, palpitations, dizziness or syncope.  No prior hx of same   Pt denies fever, wt loss, night sweats, loss of appetite, or other constitutional symptoms  No hx of TB or pos PPD.  Denies specific mouth pain and seems to breathe and swallow ok.  Does have some stable DOE with going uphill but no change in the past week.  Has taken excedrin and celebrex off and on for pain, does not take anticoagulant.  Denies worsening reflux, abd pain, dysphagia, n/v, bowel change or blood.  Non smoker       Wt Readings from Last 3 Encounters:  04/10/21 192 lb (87.1 kg)  09/26/20 185 lb 9.6 oz (84.2 kg)  09/20/20 185 lb (83.9 kg)   BP Readings from Last 3 Encounters:  04/10/21 128/64  09/26/20 110/70  09/20/20 105/64         Past Medical History:  Diagnosis Date   Arthritis    Cancer ([place]) 13 years ago   bladder   GERD (gastroesophageal reflux disease)    Goiter    synthroid used to shrink   H/O hiatal hernia    Headache(784.0)    migraines in morning   PONV (postoperative nausea and vomiting)    Seasonal allergies    Past Surgical History:  Procedure Laterality Date   ARTHRODESIS FOOT WITH WEIL OSTEOTOMY Right 07/26/2014   Procedure: RIGHT HALLUX METATARSOPHALANGEAL JOINT ARTHRODESIS; RIGHT SECOND METATARSAL  WEIL OSTEOTOMY; DORSAL CAPSULOTOMY EXTENSOR TENDON LENGTHENING;  Surgeon: Wylene Simmer, MD;  Location: [facility];  Service: Orthopedics;  Laterality: Right;   BLADDER SURGERY  13 years ago   BUNIONECTOMY Bilateral    COLONOSCOPY     LUMBAR FUSION  May 2014   L4-L5   ROTATOR CUFF REPAIR Bilateral ~2010, 2012   SPINAL FUSION     L1, L2   TONSILLECTOMY  as child   TOTAL KNEE ARTHROPLASTY Right 07/19/2012   Procedure: TOTAL KNEE ARTHROPLASTY;  Surgeon: Mauri Pole, MD;  Location: [facility];  Service: Orthopedics;  Laterality: Right;   TOTAL KNEE ARTHROPLASTY Left 08/01/2013   Procedure: LEFT TOTAL KNEE ARTHROPLASTY;  Surgeon: Mauri Pole, MD;  Location: [facility];  Service: Orthopedics;  Laterality: Left;    reports that she quit smoking about 56 years ago. Her smoking use included cigarettes. She has a 1.25 pack-year smoking history. She has never used smokeless tobacco. She reports current alcohol use. She reports that she does not use drugs. family history includes Brain cancer in her father; Cancer (age of onset: 67) in her brother. Allergies  Allergen Reactions   Doxycycline Hives   Crab [Shellfish Allergy]    Nexium [Esomeprazole Magnesium]     palpitations  Penicillins    Prolia [Denosumab]     Jaw necrosis wound   Current Outpatient Medications on File Prior to Visit  Medication Sig Dispense Refill   Biotin 5000 MCG TABS Take 1 tablet by mouth daily.     calcitonin, salmon, (MIACALCIN/FORTICAL) 200 UNIT/ACT nasal spray PLACE 1 SPRAY INTO ALTERNATING NOSTRILS DAILY 11.1 mL 11   calcium-vitamin D (OSCAL WITH D) 500-200 MG-UNIT per tablet Take 1 tablet by mouth daily.     celecoxib (CELEBREX) 100 MG capsule TAKE ONE CAPSULE BY MOUTH DAILY AS NEEDED FOR MODERATE PAIN 90 capsule 3   cetirizine (ZYRTEC) 10 MG tablet Take 10 mg by mouth daily.     Cholecalciferol (VITAMIN D3) 2000 units capsule Take 1 capsule (2,000 Units total) by mouth daily. 100 capsule 3   diazepam  (VALIUM) 5 MG tablet TAKE 1/2 TO 1 TABLET BY MOUTH EVERY 12 HOURS AS NEEDED FOR ANXIETY 90 tablet 1   escitalopram (LEXAPRO) 5 MG tablet TAKE ONE TABLET BY MOUTH DAILY 30 tablet 5   gabapentin (NEURONTIN) 300 MG capsule TAKE ONE CAPSULE BY MOUTH EVERY NIGHT AT BEDTIME 90 capsule 3   glucosamine-chondroitin 500-400 MG tablet Take 1 tablet by mouth daily.     levothyroxine (SYNTHROID) 25 MCG tablet TAKE ONE TABLET BY MOUTH DAILY BEFORE BREAKFAST 90 tablet 3   Multiple Vitamin (MULTIVITAMIN) tablet Take 1 tablet by mouth daily.     pantoprazole (PROTONIX) 40 MG tablet Take 1 tablet (40 mg total) by mouth daily. 90 tablet 3   vitamin C (ASCORBIC ACID) 500 MG tablet Take 1,000 mg by mouth daily.      No current facility-administered medications on file prior to visit.        ROS:  All others reviewed and negative.  Objective        PE:  BP 128/64 (BP Location: Right Arm, Patient Position: Sitting, Cuff Size: Large)   Pulse 76   Temp 98.7 F (37.1 C) (Oral)   Ht 5\' 2"  (1.575 m)   Wt 192 lb (87.1 kg)   SpO2 96%   BMI 35.12 kg/m                 Constitutional: Pt appears in NAD               HENT: Head: NCAT.                Right Ear: External ear normal.                 Left Ear: External ear normal.                Eyes: . Pupils are equal, round, and reactive to light. Conjunctivae and EOM are normal               Nose: without d/c or deformity, no bleeding or swelliing; mouth without ulcer, blood, swelling, mass or other significant abnormal               Neck: Neck supple. Gross normal ROM, no LA or other abnormal               Cardiovascular: Normal rate and regular rhythm.                 Pulmonary/Chest: Effort normal and breath sounds without rales or wheezing.                Abd:  Soft, NT, ND, + BS, no organomegaly  Neurological: Pt is alert. At baseline orientation, motor grossly intact               Skin: Skin is warm. No rashes, no other new lesions, LE edema -  none               Psychiatric: Pt behavior is normal without agitation   Micro: none  Cardiac tracings I have personally interpreted today:  none  Pertinent Radiological findings (summarize): none   Lab Results  Component Value Date   WBC 4.5 04/10/2021   HGB 12.7 04/10/2021   HCT 38.0 04/10/2021   PLT 243.0 04/10/2021   GLUCOSE 91 04/10/2021   CHOL 187 06/20/2020   TRIG 141.0 06/20/2020   HDL 58.70 06/20/2020   LDLCALC 100 (H) 06/20/2020   ALT 28 04/10/2021   AST 32 04/10/2021   NA 141 04/10/2021   K 4.2 04/10/2021   CL 105 04/10/2021   CREATININE 1.24 (H) 04/10/2021   BUN 22 04/10/2021   CO2 31 04/10/2021   TSH 1.69 06/20/2020   INR 1.0 04/10/2021   Assessment/Plan:  Litsy Epting is a 80 y.o. White or Caucasian [1] female with  has a past medical history of Arthritis, Cancer ([facility]) (13 years ago), GERD (gastroesophageal reflux disease), Goiter, H/O hiatal hernia, Headache(784.0), PONV (postoperative nausea and vomiting), and Seasonal allergies.  Bleeding from mouth Etiology unlear, for cbc, inr, bmp and refer ENT for hopeful nasopharyngoscopy,  to f/u any worsening symptoms or concerns   Hemoptysis Cant r/o hemoptysis, exam seems o/w benign but cant r/o subclinical pna - for cxr, and refer pulmonary now as may take several months to be seen due to covid and staffing  Osteoarthritis D/w pt - ok to continue celebrex prn for now  Allergic rhinitis Controlled, doubt sinus issue related to drooling blood,  to f/u any worsening symptoms or concerns  DOE (dyspnea on exertion) By hx seems c/w age appropriate deconditioning and obesity; for cxr as above  Followup: Return in about 4 weeks (around 05/08/2021) for follow up to PCP 1 mo.  Cathlean Cower, MD 04/13/2021 4:45 PM [facility]

## 2021-04-10 NOTE — Patient Instructions (Addendum)
Please continue all other medications as before, and refills have been done if requested.  Please have the pharmacy call with any other refills you may need.  Please continue your efforts at being more active, low cholesterol diet, and weight control.  You are otherwise up to date with prevention measures today.  Please keep your appointments with your specialists as you may have planned  You will be contacted regarding the referral for: ENT to check the back of the throat, and pulmonary  Please go to the XRAY Department in the first floor for the x-ray testing  Please go to the LAB at the blood drawing area for the tests to be done  You will be contacted by phone if any changes need to be made immediately.  Otherwise, you will receive a letter about your results with an explanation, but please check with MyChart first.  Please remember to sign up for MyChart if you have not done so, as this will be important to you in the future with finding out test results, communicating by private email, and scheduling acute appointments online when needed.  Please make an Appointment to return to Dr Alain Marion in 1 month, or sooner if needed

## 2021-04-13 ENCOUNTER — Encounter: Payer: Self-pay | Admitting: Internal Medicine

## 2021-04-13 NOTE — Assessment & Plan Note (Signed)
By hx seems c/w age appropriate deconditioning and obesity; for cxr as above

## 2021-04-13 NOTE — Assessment & Plan Note (Signed)
Cant r/o hemoptysis, exam seems o/w benign but cant r/o subclinical pna - for cxr, and refer pulmonary now as may take several months to be seen due to covid and staffing

## 2021-04-13 NOTE — Assessment & Plan Note (Signed)
Controlled, doubt sinus issue related to drooling blood,  to f/u any worsening symptoms or concerns

## 2021-04-13 NOTE — Assessment & Plan Note (Signed)
Etiology unlear, for cbc, inr, bmp and refer ENT for hopeful nasopharyngoscopy,  to f/u any worsening symptoms or concerns

## 2021-04-13 NOTE — Assessment & Plan Note (Signed)
D/w pt - ok to continue celebrex prn for now

## 2021-04-14 ENCOUNTER — Other Ambulatory Visit: Payer: Self-pay | Admitting: Internal Medicine

## 2021-04-29 NOTE — Progress Notes (Signed)
Synopsis: Referred for hemoptysis by Plotnikov, Evie Lacks, MD  Subjective:   PATIENT ID: Mariah Farrell GENDER: female DOB: Jan 09, 1941, MRN: [id number]  Chief Complaint  Patient presents with   Consult    Pt states woke up with blood on pillow and out of her mouth. Have a cough now. Had Covid in summer   80yF with history of bladder cancer, hiatal hernia, covid-19 infection 11/2020 with some lingering cough  Seen by PCP 11/10 for complaint of hemoptysis. CBC WNL, LFTs ok, INR WNL. On no AC or antiplatelet. Had horrible cough at the time and woke up with blood pooling on pillow. Completed course of azithromycin for presumed acute bronchitis. Still has a mild cough which is not productive. No fever at the time. Didn't have much of a productive cough at the time either. No PND sensation. No epistaxis. No associated n/v. No hematuria, coca cola colored urine. No CP. Has never had a blood clot before.  Has never had similar episode.   Otherwise pertinent review of systems is negative.  No family history of lung disease, lung cancer  She was a homemaker, later an event planner. Has lived in [place], [place] before, but has been in [place] since 1969. No pets, including no birds. Was in the [place] on a cruise over the summer.   Smoked for only 5-6 much less than a pack a day in distant past.     Past Medical History:  Diagnosis Date   Arthritis    Cancer ([place]) 13 years ago   bladder   GERD (gastroesophageal reflux disease)    Goiter    synthroid used to shrink   H/O hiatal hernia    Headache(784.0)    migraines in morning   PONV (postoperative nausea and vomiting)    Seasonal allergies      Family History  Problem Relation Age of Onset   Cancer Brother 13       pancreatic   Brain cancer Father      Past Surgical History:  Procedure Laterality Date   ARTHRODESIS FOOT WITH WEIL OSTEOTOMY Right 07/26/2014   Procedure: RIGHT HALLUX METATARSOPHALANGEAL JOINT ARTHRODESIS; RIGHT  SECOND METATARSAL WEIL OSTEOTOMY; DORSAL CAPSULOTOMY EXTENSOR TENDON LENGTHENING;  Surgeon: Wylene Simmer, MD;  Location: [facility];  Service: Orthopedics;  Laterality: Right;   BLADDER SURGERY  13 years ago   BUNIONECTOMY Bilateral    COLONOSCOPY     LUMBAR FUSION  May 2014   L4-L5   ROTATOR CUFF REPAIR Bilateral ~2010, 2012   SPINAL FUSION     L1, L2   TONSILLECTOMY  as child   TOTAL KNEE ARTHROPLASTY Right 07/19/2012   Procedure: TOTAL KNEE ARTHROPLASTY;  Surgeon: Mauri Pole, MD;  Location[facility];  Service: Orthopedics;  Laterality: Right;   TOTAL KNEE ARTHROPLASTY Left 08/01/2013   Procedure: LEFT TOTAL KNEE ARTHROPLASTY;  Surgeon: Mauri Pole, MD;  Location[facility];  Service: Orthopedics;  Laterality: Left;    Social History   Socioeconomic History   Marital status: Married    Spouse name: Not on file   Number of children: Not on file   Years of education: Not on file   Highest education level: Not on file  Occupational History   Not on file  Tobacco Use   Smoking status: Former    Packs/day: 0.25    Years: 5.00    Pack years: 1.25    Types: Cigarettes    Quit date: 06/01/1964  Years since quitting: 56.9   Smokeless tobacco: Never  Vaping Use   Vaping Use: Never used  Substance and Sexual Activity   Alcohol use: Yes    Comment: 1-2 times weekly   Drug use: No   Sexual activity: Not on file  Other Topics Concern   Not on file  Social History Narrative   Not on file   Social Determinants of Health   Financial Resource Strain: Low Risk    Difficulty of Paying Living Expenses: Not hard at all  Food Insecurity: No Food Insecurity   Worried About Charity fundraiser in the Last Year: Never true   [place] in the Last Year: Never true  Transportation Needs: No Transportation Needs   Lack of Transportation (Medical): No   Lack of Transportation (Non-Medical): No  Physical Activity: Sufficiently Active   Days of Exercise per Week: 5 days    Minutes of Exercise per Session: 30 min  Stress: No Stress Concern Present   Feeling of Stress : Not at all  Social Connections: Socially Integrated   Frequency of Communication with Friends and Family: More than three times a week   Frequency of Social Gatherings with Friends and Family: Twice a week   Attends Religious Services: More than 4 times per year   Active Member of [employer] or Organizations: Yes   Attends Music therapist: More than 4 times per year   Marital Status: Married  Human resources officer Violence: Not on file     Allergies  Allergen Reactions   Doxycycline Hives   Crab [Shellfish Allergy]    Nexium [Esomeprazole Magnesium]     palpitations   Penicillins    Prolia [Denosumab]     Jaw necrosis wound     Outpatient Medications Prior to Visit  Medication Sig Dispense Refill   Biotin 5000 MCG TABS Take 1 tablet by mouth daily.     calcitonin, salmon, (MIACALCIN/FORTICAL) 200 UNIT/ACT nasal spray USE ONE SPRAY IN ONE NOSTRIL DAILY. ALTERNATE NOSTRILS 11.1 mL 11   calcium-vitamin D (OSCAL WITH D) 500-200 MG-UNIT per tablet Take 1 tablet by mouth daily.     celecoxib (CELEBREX) 100 MG capsule TAKE ONE CAPSULE BY MOUTH DAILY AS NEEDED FOR MODERATE PAIN 90 capsule 3   cetirizine (ZYRTEC) 10 MG tablet Take 10 mg by mouth daily.     Cholecalciferol (VITAMIN D3) 2000 units capsule Take 1 capsule (2,000 Units total) by mouth daily. 100 capsule 3   diazepam (VALIUM) 5 MG tablet TAKE 1/2 TO 1 TABLET BY MOUTH EVERY 12 HOURS AS NEEDED FOR ANXIETY 90 tablet 1   gabapentin (NEURONTIN) 300 MG capsule TAKE ONE CAPSULE BY MOUTH EVERY NIGHT AT BEDTIME 90 capsule 3   glucosamine-chondroitin 500-400 MG tablet Take 1 tablet by mouth daily.     levothyroxine (SYNTHROID) 25 MCG tablet TAKE ONE TABLET BY MOUTH DAILY BEFORE BREAKFAST 90 tablet 3   Multiple Vitamin (MULTIVITAMIN) tablet Take 1 tablet by mouth daily.     pantoprazole (PROTONIX) 40 MG tablet Take 1 tablet (40 mg  total) by mouth daily. 90 tablet 3   vitamin C (ASCORBIC ACID) 500 MG tablet Take 1,000 mg by mouth daily.      escitalopram (LEXAPRO) 5 MG tablet TAKE ONE TABLET BY MOUTH DAILY (Patient not taking: Reported on 04/30/2021) 30 tablet 5   No facility-administered medications prior to visit.       Objective:   Physical Exam:  General appearance: 80 y.o., female,  NAD, conversant  Eyes: anicteric sclerae; PERRL, tracking appropriately HENT: NCAT; MMM Neck: Trachea midline; no lymphadenopathy, no JVD Lungs: CTAB, no crackles, no wheeze, with normal respiratory effort CV: RRR, no murmur  Abdomen: Soft, non-tender; non-distended, BS present  Extremities: No peripheral edema, warm Skin: Normal turgor and texture; no rash Psych: Appropriate affect Neuro: Alert and oriented to person and place, no focal deficit     Vitals:   04/30/21 0958  BP: 114/72  Pulse: 89  Temp: 98.4 F (36.9 C)  TempSrc: Oral  SpO2: 93%  Weight: 194 lb 6.4 oz (88.2 kg)  Height: 5' (1.524 m)   93% on RA BMI Readings from Last 3 Encounters:  04/30/21 37.97 kg/m  04/10/21 35.12 kg/m  09/26/20 33.95 kg/m   Wt Readings from Last 3 Encounters:  04/30/21 194 lb 6.4 oz (88.2 kg)  04/10/21 192 lb (87.1 kg)  09/26/20 185 lb 9.6 oz (84.2 kg)     CBC    Component Value Date/Time   WBC 4.5 04/10/2021 1027   RBC 3.91 04/10/2021 1027   HGB 12.7 04/10/2021 1027   HCT 38.0 04/10/2021 1027   PLT 243.0 04/10/2021 1027   MCV 97.3 04/10/2021 1027   MCH 31.5 09/10/2015 1333   MCHC 33.5 04/10/2021 1027   RDW 13.4 04/10/2021 1027   LYMPHSABS 0.8 04/10/2021 1027   MONOABS 0.4 04/10/2021 1027   EOSABS 0.3 04/10/2021 1027   BASOSABS 0.1 04/10/2021 1027    Chest Imaging: CXR 04/10/21 reviewed by me stable relative to prior  Pulmonary Functions Testing Results: No flowsheet data found.      Assessment & Plan:   # Hemoptysis: Certainly could have been in setting of acute bronchitis/URI/LRTI. Single  episode, cough improved after course of ABX. She is low risk for lung CA.  Plan: - We discussed strategy of watchful waiting vs obtaining CT Chest now with contrast. We decided that if she another episode of non-life-threatening hemoptysis without clear source I encouraged her to reach out to [place] so we can discuss obtaining CT Chest with contrast  RTC as needed   I spent 38 minutes dedicated to the care of this patient on the date of this encounter to include pre-visit review of records, face-to-face time with the patient discussing conditions above, post visit ordering of testing, clinical documentation with the electronic health record, making appropriate referrals as documented, and communicating necessary findings to members of the patients care team.    Maryjane Hurter, MD [facility] 04/30/2021 10:28 AM

## 2021-04-30 ENCOUNTER — Other Ambulatory Visit: Payer: Self-pay

## 2021-04-30 ENCOUNTER — Encounter: Payer: Self-pay | Admitting: Student

## 2021-04-30 ENCOUNTER — Ambulatory Visit: Payer: Medicare HMO | Admitting: Student

## 2021-04-30 VITALS — BP 114/72 | HR 89 | Temp 98.4°F | Ht 60.0 in | Wt 194.4 lb

## 2021-04-30 DIAGNOSIS — K1379 Other lesions of oral mucosa: Secondary | ICD-10-CM | POA: Diagnosis not present

## 2021-04-30 NOTE — Patient Instructions (Addendum)
-   If you develop bloody cough or bleeding from mouth again please get back in touch and we can talk about getting a CT Chest.

## 2021-05-12 ENCOUNTER — Ambulatory Visit: Payer: Medicare HMO | Admitting: Internal Medicine

## 2021-05-14 DIAGNOSIS — M79644 Pain in right finger(s): Secondary | ICD-10-CM | POA: Diagnosis not present

## 2021-05-14 DIAGNOSIS — M13849 Other specified arthritis, unspecified hand: Secondary | ICD-10-CM | POA: Diagnosis not present

## 2021-05-14 DIAGNOSIS — M151 Heberden's nodes (with arthropathy): Secondary | ICD-10-CM | POA: Diagnosis not present

## 2021-05-15 DIAGNOSIS — M19049 Primary osteoarthritis, unspecified hand: Secondary | ICD-10-CM | POA: Insufficient documentation

## 2021-06-03 DIAGNOSIS — Z01419 Encounter for gynecological examination (general) (routine) without abnormal findings: Secondary | ICD-10-CM | POA: Diagnosis not present

## 2021-06-10 ENCOUNTER — Telehealth: Payer: Self-pay | Admitting: Student

## 2021-06-10 DIAGNOSIS — R042 Hemoptysis: Secondary | ICD-10-CM

## 2021-06-10 NOTE — Telephone Encounter (Signed)
Call made to patient, confirmed DOB. Patient states she was told to call if she coughed up blood. She states its on her pillow but she did not realize that she coughed during the night and she now has chest discomfort. She reports today it was just a splatter of blood and says its dark red. Denies SOB. Able to communicate needs and speak in complete sentences. She reports some chest pain but she thinks its more muscular. Requesting recommendations.   AO please advise. Thanks.

## 2021-06-10 NOTE — Telephone Encounter (Signed)
Spoke with pt and informed her of Dr. Judson Roch orders. Pt stated understanding. CT order placed. Nothing further needed at this time.

## 2021-06-10 NOTE — Telephone Encounter (Signed)
We can schedule her for a CT scan of the chest without contrast and have a follow-up appointment with Dr. Verlee Monte following the CT  No other intervention needed of present  Can let us know if she is having symptoms that she feels may be related to an infection for which she may need antibiotics

## 2021-06-23 ENCOUNTER — Ambulatory Visit (INDEPENDENT_AMBULATORY_CARE_PROVIDER_SITE_OTHER): Payer: Medicare HMO | Admitting: Internal Medicine

## 2021-06-23 ENCOUNTER — Encounter: Payer: Self-pay | Admitting: Internal Medicine

## 2021-06-23 ENCOUNTER — Other Ambulatory Visit: Payer: Self-pay

## 2021-06-23 VITALS — BP 130/81 | HR 86 | Temp 97.7°F | Ht 60.0 in | Wt 190.8 lb

## 2021-06-23 DIAGNOSIS — K1379 Other lesions of oral mucosa: Secondary | ICD-10-CM | POA: Diagnosis not present

## 2021-06-23 DIAGNOSIS — R252 Cramp and spasm: Secondary | ICD-10-CM | POA: Insufficient documentation

## 2021-06-23 DIAGNOSIS — Z0000 Encounter for general adult medical examination without abnormal findings: Secondary | ICD-10-CM | POA: Diagnosis not present

## 2021-06-23 DIAGNOSIS — R0789 Other chest pain: Secondary | ICD-10-CM

## 2021-06-23 DIAGNOSIS — Z6837 Body mass index (BMI) 37.0-37.9, adult: Secondary | ICD-10-CM

## 2021-06-23 LAB — COMPREHENSIVE METABOLIC PANEL
ALT: 19 U/L (ref 0–35)
AST: 27 U/L (ref 0–37)
Albumin: 4.1 g/dL (ref 3.5–5.2)
Alkaline Phosphatase: 41 U/L (ref 39–117)
BUN: 21 mg/dL (ref 6–23)
CO2: 28 mEq/L (ref 19–32)
Calcium: 9.6 mg/dL (ref 8.4–10.5)
Chloride: 107 mEq/L (ref 96–112)
Creatinine, Ser: 1.26 mg/dL — ABNORMAL HIGH (ref 0.40–1.20)
GFR: 39.75 mL/min — ABNORMAL LOW (ref >60.00)
Glucose, Bld: 87 mg/dL (ref 70–99)
Potassium: 4 mEq/L (ref 3.5–5.1)
Sodium: 144 mEq/L (ref 135–145)
Total Bilirubin: 0.7 mg/dL (ref 0.2–1.2)
Total Protein: 6.6 g/dL (ref 6.0–8.3)

## 2021-06-23 LAB — URINALYSIS, ROUTINE W REFLEX MICROSCOPIC
Bilirubin Urine: NEGATIVE
Hgb urine dipstick: NEGATIVE
Ketones, ur: NEGATIVE
Nitrite: NEGATIVE
RBC / HPF: NONE SEEN (ref 0–?)
Specific Gravity, Urine: 1.025 (ref 1.000–1.030)
Total Protein, Urine: NEGATIVE
Urine Glucose: NEGATIVE
Urobilinogen, UA: 0.2 (ref 0.0–1.0)
pH: 5.5 (ref 5.0–8.0)

## 2021-06-23 LAB — CBC WITH DIFFERENTIAL/PLATELET
Basophils Absolute: 0.1 10*3/uL (ref 0.0–0.1)
Basophils Relative: 1.4 % (ref 0.0–3.0)
Eosinophils Absolute: 0.2 10*3/uL (ref 0.0–0.7)
Eosinophils Relative: 4.3 % (ref 0.0–5.0)
HCT: 39.5 % (ref 36.0–46.0)
Hemoglobin: 13 g/dL (ref 12.0–15.0)
Lymphocytes Relative: 18.8 % (ref 12.0–46.0)
Lymphs Abs: 0.9 10*3/uL (ref 0.7–4.0)
MCHC: 32.8 g/dL (ref 30.0–36.0)
MCV: 97.2 fl (ref 78.0–100.0)
Monocytes Absolute: 0.5 10*3/uL (ref 0.1–1.0)
Monocytes Relative: 9.6 % (ref 3.0–12.0)
Neutro Abs: 3.2 10*3/uL (ref 1.4–7.7)
Neutrophils Relative %: 65.9 % (ref 43.0–77.0)
Platelets: 268 10*3/uL (ref 150.0–400.0)
RBC: 4.06 Mil/uL (ref 3.87–5.11)
RDW: 13.5 % (ref 11.5–15.5)
WBC: 4.9 10*3/uL (ref 4.0–10.5)

## 2021-06-23 LAB — LIPID PANEL
Cholesterol: 149 mg/dL (ref 0–200)
HDL: 64.7 mg/dL (ref >39.00)
LDL Cholesterol: 66 mg/dL (ref 0–99)
NonHDL: 83.88
Total CHOL/HDL Ratio: 2
Triglycerides: 89 mg/dL (ref 0.0–149.0)
VLDL: 17.8 mg/dL (ref 0.0–40.0)

## 2021-06-23 LAB — TSH: TSH: 1.03 u[IU]/mL (ref 0.35–5.50)

## 2021-06-23 NOTE — Assessment & Plan Note (Signed)
Chest CT is due this Friday - Dr Randie Heinz

## 2021-06-23 NOTE — Assessment & Plan Note (Signed)
Wt Readings from Last 3 Encounters:  06/23/21 190 lb 12.8 oz (86.5 kg)  04/30/21 194 lb 6.4 oz (88.2 kg)  04/10/21 192 lb (87.1 kg)

## 2021-06-23 NOTE — Assessment & Plan Note (Signed)
1/23 [facility] vs other Chest CT is due this Friday - Dr Randie Heinz [facility])

## 2021-06-23 NOTE — Assessment & Plan Note (Addendum)
Take Magnesium for cramps 200 mg/d Use Magnesium oil spray for acute cramp Check labs

## 2021-06-23 NOTE — Patient Instructions (Addendum)
Take Magnesium for cramps 200 mg/d  Use Magnesium oil spray for acute cramp

## 2021-06-23 NOTE — Assessment & Plan Note (Signed)
We discussed age appropriate health related issues, including available/recomended screening tests and vaccinations. We discussed a need for adhering to healthy diet and exercise. Labs were ordered to be later reviewed . All questions were answered. GYN/mammo q 12 mo

## 2021-06-23 NOTE — Progress Notes (Signed)
Subjective:  Patient ID: Mariah Farrell, female    DOB: June 24, 1940  Age: 81 y.o. MRN: [id number]  CC: Annual Exam   HPI Mariah Farrell presents for a well exam C/o L chest pain and bronchitis, blood on the pillow. Pt fell 2-3 mo ago in a closet. No LOC C/o cramps  Outpatient Medications Prior to Visit  Medication Sig Dispense Refill   Biotin 5000 MCG TABS Take 1 tablet by mouth daily.     calcitonin, salmon, (MIACALCIN/FORTICAL) 200 UNIT/ACT nasal spray USE ONE SPRAY IN ONE NOSTRIL DAILY. ALTERNATE NOSTRILS 11.1 mL 11   calcium-vitamin D (OSCAL WITH D) 500-200 MG-UNIT per tablet Take 1 tablet by mouth daily.     celecoxib (CELEBREX) 100 MG capsule TAKE ONE CAPSULE BY MOUTH DAILY AS NEEDED FOR MODERATE PAIN 90 capsule 3   cetirizine (ZYRTEC) 10 MG tablet Take 10 mg by mouth daily.     Cholecalciferol (VITAMIN D3) 2000 units capsule Take 1 capsule (2,000 Units total) by mouth daily. 100 capsule 3   diazepam (VALIUM) 5 MG tablet TAKE 1/2 TO 1 TABLET BY MOUTH EVERY 12 HOURS AS NEEDED FOR ANXIETY 90 tablet 1   gabapentin (NEURONTIN) 300 MG capsule TAKE ONE CAPSULE BY MOUTH EVERY NIGHT AT BEDTIME 90 capsule 3   glucosamine-chondroitin 500-400 MG tablet Take 1 tablet by mouth daily.     levothyroxine (SYNTHROID) 25 MCG tablet TAKE ONE TABLET BY MOUTH DAILY BEFORE BREAKFAST 90 tablet 3   Multiple Vitamin (MULTIVITAMIN) tablet Take 1 tablet by mouth daily.     pantoprazole (PROTONIX) 40 MG tablet Take 1 tablet (40 mg total) by mouth daily. 90 tablet 3   vitamin C (ASCORBIC ACID) 500 MG tablet Take 1,000 mg by mouth daily.      escitalopram (LEXAPRO) 5 MG tablet TAKE ONE TABLET BY MOUTH DAILY (Patient not taking: Reported on 04/30/2021) 30 tablet 5   No facility-administered medications prior to visit.    ROS: Review of Systems  Constitutional:  Negative for activity change, appetite change, chills, diaphoresis, fatigue, fever and unexpected weight change.  HENT:  Negative  for congestion, dental problem, ear pain, hearing loss, mouth sores, postnasal drip, sinus pressure, sneezing, sore throat and voice change.   Eyes:  Negative for pain and visual disturbance.  Respiratory:  Negative for cough, chest tightness, wheezing and stridor.   Cardiovascular:  Positive for chest pain. Negative for palpitations and leg swelling.  Gastrointestinal:  Negative for abdominal distention, abdominal pain, blood in stool, nausea, rectal pain and vomiting.  Genitourinary:  Negative for decreased urine volume, difficulty urinating, dysuria, frequency, hematuria, menstrual problem, vaginal bleeding, vaginal discharge and vaginal pain.  Musculoskeletal:  Positive for arthralgias, back pain, gait problem, neck pain and neck stiffness. Negative for joint swelling.  Skin:  Negative for color change, rash and wound.  Neurological:  Negative for dizziness, tremors, syncope, speech difficulty, weakness and light-headedness.  Hematological:  Negative for adenopathy.  Psychiatric/Behavioral:  Negative for behavioral problems, confusion, decreased concentration, dysphoric mood, hallucinations, sleep disturbance and suicidal ideas. The patient is not nervous/anxious and is not hyperactive.    Objective:  BP 130/81 (BP Location: Left Arm)    Pulse 86    Temp 97.7 F (36.5 C) (Oral)    Ht 5' (1.524 m)    Wt 190 lb 12.8 oz (86.5 kg)    SpO2 94%    BMI 37.26 kg/m   BP Readings from Last 3 Encounters:  06/23/21 130/81  04/30/21 114/72  04/10/21 128/64    Wt Readings from Last 3 Encounters:  06/23/21 190 lb 12.8 oz (86.5 kg)  04/30/21 194 lb 6.4 oz (88.2 kg)  04/10/21 192 lb (87.1 kg)    Physical Exam Constitutional:      General: She is not in acute distress.    Appearance: She is well-developed. She is obese.  HENT:     Head: Normocephalic.     Right Ear: External ear normal.     Left Ear: External ear normal.     Nose: Nose normal.  Eyes:     General:        Right eye: No  discharge.        Left eye: No discharge.     Conjunctiva/sclera: Conjunctivae normal.     Pupils: Pupils are equal, round, and reactive to light.  Neck:     Thyroid: No thyromegaly.     Vascular: No JVD.     Trachea: No tracheal deviation.  Cardiovascular:     Rate and Rhythm: Normal rate and regular rhythm.     Heart sounds: Normal heart sounds.  Pulmonary:     Effort: No respiratory distress.     Breath sounds: No stridor. No wheezing.  Abdominal:     General: Bowel sounds are normal. There is no distension.     Palpations: Abdomen is soft. There is no mass.     Tenderness: There is no abdominal tenderness. There is no guarding or rebound.  Musculoskeletal:        General: No tenderness.     Cervical back: Normal range of motion and neck supple. No rigidity.  Lymphadenopathy:     Cervical: No cervical adenopathy.  Skin:    Findings: No erythema or rash.  Neurological:     Mental Status: She is oriented to person, place, and time.     Cranial Nerves: No cranial nerve deficit.     Motor: No abnormal muscle tone.     Coordination: Coordination normal.     Deep Tendon Reflexes: Reflexes normal.  Psychiatric:        Behavior: Behavior normal.        Thought Content: Thought content normal.        Judgment: Judgment normal.  A bit unsteady Chest NT  Lab Results  Component Value Date   WBC 4.5 04/10/2021   HGB 12.7 04/10/2021   HCT 38.0 04/10/2021   PLT 243.0 04/10/2021   GLUCOSE 91 04/10/2021   CHOL 187 06/20/2020   TRIG 141.0 06/20/2020   HDL 58.70 06/20/2020   LDLCALC 100 (H) 06/20/2020   ALT 28 04/10/2021   AST 32 04/10/2021   NA 141 04/10/2021   K 4.2 04/10/2021   CL 105 04/10/2021   CREATININE 1.24 (H) 04/10/2021   BUN 22 04/10/2021   CO2 31 04/10/2021   TSH 1.69 06/20/2020   INR 1.0 04/10/2021    DG Chest 2 View  Result Date: 04/10/2021 CLINICAL DATA:  81 year old female with history of cough and shortness of breath for the past 2 days. EXAM: CHEST -  2 VIEW COMPARISON:  Chest x-ray 06/20/2020. FINDINGS: Lung volumes are normal. Extensive diffuse peribronchial cuffing. No consolidative airspace disease. No pleural effusions. No pneumothorax. No pulmonary nodule or mass noted. Pulmonary vasculature and the cardiomediastinal silhouette are within normal limits. Orthopedic fixation hardware in the lower thoracic spine incidentally noted. IMPRESSION: 1. Diffuse peribronchial cuffing, concerning for an acute bronchitis Electronically Signed   By: Mauri Brooklyn.D.  On: 04/10/2021 16:28    Assessment & Plan:   Problem List Items Addressed This Visit     Bleeding from mouth    Chest CT is due this Friday - Dr Randie Heinz      Chest pain, atypical    1/23 [facility] vs other Chest CT is due this Friday - Dr Randie Heinz [facility])      Cramp in limb    Take Magnesium for cramps 200 mg/d Use Magnesium oil spray for acute cramp Check labs      Obesity, unspecified    Wt Readings from Last 3 Encounters:  06/23/21 190 lb 12.8 oz (86.5 kg)  04/30/21 194 lb 6.4 oz (88.2 kg)  04/10/21 192 lb (87.1 kg)        Well adult exam    We discussed age appropriate health related issues, including available/recomended screening tests and vaccinations. We discussed a need for adhering to healthy diet and exercise. Labs were ordered to be later reviewed . All questions were answered. GYN/mammo q 12 mo      Relevant Orders   TSH   Urinalysis   CBC with Differential/Platelet   Lipid panel   Comprehensive metabolic panel      No orders of the defined types were placed in this encounter.     Follow-up: Return in about 3 months (around 09/21/2021) for a follow-up visit.  Walker Kehr, MD

## 2021-06-25 ENCOUNTER — Other Ambulatory Visit: Payer: Self-pay | Admitting: Internal Medicine

## 2021-06-27 ENCOUNTER — Ambulatory Visit (INDEPENDENT_AMBULATORY_CARE_PROVIDER_SITE_OTHER)
Admission: RE | Admit: 2021-06-27 | Discharge: 2021-06-27 | Disposition: A | Discharge status: Home / Self Care | Payer: Medicare HMO | Source: Ambulatory Visit | Attending: Pulmonary Disease | Admitting: Pulmonary Disease

## 2021-06-27 ENCOUNTER — Other Ambulatory Visit: Payer: Self-pay

## 2021-06-27 DIAGNOSIS — R042 Hemoptysis: Secondary | ICD-10-CM

## 2021-06-27 DIAGNOSIS — I700 Atherosclerosis of aorta: Secondary | ICD-10-CM | POA: Diagnosis not present

## 2021-07-27 ENCOUNTER — Other Ambulatory Visit: Payer: Self-pay | Admitting: Internal Medicine

## 2021-09-17 DIAGNOSIS — Z85828 Personal history of other malignant neoplasm of skin: Secondary | ICD-10-CM | POA: Diagnosis not present

## 2021-09-17 DIAGNOSIS — L565 Disseminated superficial actinic porokeratosis (DSAP): Secondary | ICD-10-CM | POA: Diagnosis not present

## 2021-09-17 DIAGNOSIS — D2261 Melanocytic nevi of right upper limb, including shoulder: Secondary | ICD-10-CM | POA: Diagnosis not present

## 2021-09-17 DIAGNOSIS — D2262 Melanocytic nevi of left upper limb, including shoulder: Secondary | ICD-10-CM | POA: Diagnosis not present

## 2021-09-17 DIAGNOSIS — D2272 Melanocytic nevi of left lower limb, including hip: Secondary | ICD-10-CM | POA: Diagnosis not present

## 2021-09-17 DIAGNOSIS — D225 Melanocytic nevi of trunk: Secondary | ICD-10-CM | POA: Diagnosis not present

## 2021-09-17 DIAGNOSIS — L570 Actinic keratosis: Secondary | ICD-10-CM | POA: Diagnosis not present

## 2021-09-17 DIAGNOSIS — L821 Other seborrheic keratosis: Secondary | ICD-10-CM | POA: Diagnosis not present

## 2021-09-17 DIAGNOSIS — L814 Other melanin hyperpigmentation: Secondary | ICD-10-CM | POA: Diagnosis not present

## 2021-09-17 DIAGNOSIS — L738 Other specified follicular disorders: Secondary | ICD-10-CM | POA: Diagnosis not present

## 2021-09-22 ENCOUNTER — Ambulatory Visit (INDEPENDENT_AMBULATORY_CARE_PROVIDER_SITE_OTHER): Payer: Medicare HMO | Admitting: Internal Medicine

## 2021-09-22 ENCOUNTER — Encounter: Payer: Self-pay | Admitting: Internal Medicine

## 2021-09-22 DIAGNOSIS — Z8616 Personal history of COVID-19: Secondary | ICD-10-CM | POA: Diagnosis not present

## 2021-09-22 DIAGNOSIS — M9904 Segmental and somatic dysfunction of sacral region: Secondary | ICD-10-CM

## 2021-09-22 DIAGNOSIS — U099 Post covid-19 condition, unspecified: Secondary | ICD-10-CM

## 2021-09-22 DIAGNOSIS — R0609 Other forms of dyspnea: Secondary | ICD-10-CM

## 2021-09-22 DIAGNOSIS — N764 Abscess of vulva: Secondary | ICD-10-CM | POA: Insufficient documentation

## 2021-09-22 DIAGNOSIS — R053 Chronic cough: Secondary | ICD-10-CM | POA: Diagnosis not present

## 2021-09-22 DIAGNOSIS — N289 Disorder of kidney and ureter, unspecified: Secondary | ICD-10-CM

## 2021-09-22 DIAGNOSIS — M1990 Unspecified osteoarthritis, unspecified site: Secondary | ICD-10-CM | POA: Diagnosis not present

## 2021-09-22 DIAGNOSIS — M545 Low back pain, unspecified: Secondary | ICD-10-CM

## 2021-09-22 LAB — COMPREHENSIVE METABOLIC PANEL
ALT: 20 U/L (ref 0–35)
AST: 27 U/L (ref 0–37)
Albumin: 4 g/dL (ref 3.5–5.2)
Alkaline Phosphatase: 51 U/L (ref 39–117)
BUN: 25 mg/dL — ABNORMAL HIGH (ref 6–23)
CO2: 27 mEq/L (ref 19–32)
Calcium: 9.3 mg/dL (ref 8.4–10.5)
Chloride: 103 mEq/L (ref 96–112)
Creatinine, Ser: 1.27 mg/dL — ABNORMAL HIGH (ref 0.40–1.20)
GFR: 39.3 mL/min — ABNORMAL LOW (ref >60.00)
Glucose, Bld: 89 mg/dL (ref 70–99)
Potassium: 4.2 mEq/L (ref 3.5–5.1)
Sodium: 137 mEq/L (ref 135–145)
Total Bilirubin: 0.8 mg/dL (ref 0.2–1.2)
Total Protein: 6.5 g/dL (ref 6.0–8.3)

## 2021-09-22 MED ORDER — TRAMADOL HCL 50 MG PO TABS: 50.0000 mg | ORAL_TABLET | Freq: Four times a day (QID) | ORAL | 0 refills | Status: AC | PRN

## 2021-09-22 MED ORDER — TRELEGY ELLIPTA 100-62.5-25 MCG/ACT IN AEPB: 1.0000 | INHALATION_SPRAY | Freq: Every day | RESPIRATORY_TRACT | 3 refills | Status: DC

## 2021-09-22 NOTE — Assessment & Plan Note (Signed)
Blue-Emu cream was recommended to use 2-3 times a day ?Celebrex qd ?Tramadol, Gabapentin prn ?

## 2021-09-22 NOTE — Assessment & Plan Note (Signed)
cough post-COVID since August 2022 (daytime) ?

## 2021-09-22 NOTE — Assessment & Plan Note (Signed)
On Vit D 

## 2021-09-22 NOTE — Progress Notes (Signed)
? ?Subjective:  ?Patient ID: Mariah Farrell, female    DOB: 12/16/1940  Age: 81 y.o. MRN: [id number] ? ?CC: No chief complaint on file. ? ? ?HPI ?Mariah Farrell presents for cough post-COVID since August 2022 (daytime) ?F/u on OA, hypothyroidism, GERD ? ?Outpatient Medications Prior to Visit  ?Medication Sig Dispense Refill  ? Biotin 5000 MCG TABS Take 1 tablet by mouth daily.    ? calcitonin, salmon, (MIACALCIN/FORTICAL) 200 UNIT/ACT nasal spray USE ONE SPRAY IN ONE NOSTRIL DAILY. ALTERNATE NOSTRILS 11.1 mL 11  ? calcium-vitamin D (OSCAL WITH D) 500-200 MG-UNIT per tablet Take 1 tablet by mouth daily.    ? celecoxib (CELEBREX) 100 MG capsule TAKE ONE CAPSULE BY MOUTH DAILY AS NEEDED FOR MODERATE PAIN 90 capsule 3  ? cetirizine (ZYRTEC) 10 MG tablet Take 10 mg by mouth daily.    ? Cholecalciferol (VITAMIN D3) 2000 units capsule Take 1 capsule (2,000 Units total) by mouth daily. 100 capsule 3  ? diazepam (VALIUM) 5 MG tablet TAKE 1/2 TO 1 TABLET BY MOUTH EVERY 12 HOURS AS NEEDED FOR ANXIETY 90 tablet 2  ? gabapentin (NEURONTIN) 300 MG capsule TAKE ONE CAPSULE BY MOUTH EVERY NIGHT AT BEDTIME 90 capsule 3  ? glucosamine-chondroitin 500-400 MG tablet Take 1 tablet by mouth daily.    ? levothyroxine (SYNTHROID) 25 MCG tablet TAKE ONE TABLET BY MOUTH DAILY BEFORE BREAKFAST 90 tablet 3  ? Multiple Vitamin (MULTIVITAMIN) tablet Take 1 tablet by mouth daily.    ? pantoprazole (PROTONIX) 40 MG tablet TAKE ONE TABLET BY MOUTH DAILY 90 tablet 3  ? vitamin C (ASCORBIC ACID) 500 MG tablet Take 1,000 mg by mouth daily.     ? levothyroxine (SYNTHROID) 25 MCG tablet TAKE ONE TABLET BY MOUTH DAILY BEFORE BREAKFAST 90 tablet 3  ? escitalopram (LEXAPRO) 5 MG tablet TAKE ONE TABLET BY MOUTH DAILY (Patient not taking: Reported on 04/30/2021) 30 tablet 5  ? ?No facility-administered medications prior to visit.  ? ? ?ROS: ?Review of Systems  ?Constitutional:  Negative for activity change, appetite change, chills, fatigue and  unexpected weight change.  ?HENT:  Positive for congestion. Negative for mouth sores, sinus pressure and sore throat.   ?Eyes:  Negative for visual disturbance.  ?Respiratory:  Positive for cough. Negative for chest tightness, shortness of breath and wheezing.   ?Gastrointestinal:  Negative for abdominal pain and nausea.  ?Genitourinary:  Negative for difficulty urinating, frequency and vaginal pain.  ?Musculoskeletal:  Negative for back pain and gait problem.  ?Skin:  Negative for pallor and rash.  ?Neurological:  Negative for dizziness, tremors, weakness, numbness and headaches.  ?Psychiatric/Behavioral:  Negative for confusion and sleep disturbance.   ? ?Objective:  ?BP 120/82 (BP Location: Left Arm, Patient Position: Sitting, Cuff Size: Large)   Pulse 72   Temp 98.3 ?F (36.8 ?C) (Oral)   Ht 5' (1.524 m)   Wt 189 lb (85.7 kg)   SpO2 94%   BMI 36.91 kg/m?  ? ?BP Readings from Last 3 Encounters:  ?09/22/21 120/82  ?06/23/21 130/81  ?04/30/21 114/72  ? ? ?Wt Readings from Last 3 Encounters:  ?09/22/21 189 lb (85.7 kg)  ?06/23/21 190 lb 12.8 oz (86.5 kg)  ?04/30/21 194 lb 6.4 oz (88.2 kg)  ? ? ?Physical Exam ?Constitutional:   ?   General: She is not in acute distress. ?   Appearance: She is well-developed. She is obese.  ?HENT:  ?   Head: Normocephalic.  ?   Right Ear: External  ear normal.  ?   Left Ear: External ear normal.  ?   Nose: Nose normal.  ?Eyes:  ?   General:     ?   Right eye: No discharge.     ?   Left eye: No discharge.  ?   Conjunctiva/sclera: Conjunctivae normal.  ?   Pupils: Pupils are equal, round, and reactive to light.  ?Neck:  ?   Thyroid: No thyromegaly.  ?   Vascular: No JVD.  ?   Trachea: No tracheal deviation.  ?Cardiovascular:  ?   Rate and Rhythm: Normal rate and regular rhythm.  ?   Heart sounds: Normal heart sounds.  ?Pulmonary:  ?   Effort: No respiratory distress.  ?   Breath sounds: No stridor. No wheezing or rhonchi.  ?Abdominal:  ?   General: Bowel sounds are normal. There is  no distension.  ?   Palpations: Abdomen is soft. There is no mass.  ?   Tenderness: There is no abdominal tenderness. There is no guarding or rebound.  ?Musculoskeletal:     ?   General: No tenderness.  ?   Cervical back: Normal range of motion and neck supple. No rigidity.  ?Lymphadenopathy:  ?   Cervical: No cervical adenopathy.  ?Skin: ?   Findings: No erythema or rash.  ?Neurological:  ?   Cranial Nerves: No cranial nerve deficit.  ?   Motor: No abnormal muscle tone.  ?   Coordination: Coordination normal.  ?   Deep Tendon Reflexes: Reflexes normal.  ?Psychiatric:     ?   Behavior: Behavior normal.     ?   Thought Content: Thought content normal.     ?   Judgment: Judgment normal.  ? ?I personally provided Trelegy inhaler use teaching. After teaching the patient should be able to use effectively. All questions were answered ? ? ?Lab Results  ?Component Value Date  ? WBC 4.9 06/23/2021  ? HGB 13.0 06/23/2021  ? HCT 39.5 06/23/2021  ? PLT 268.0 06/23/2021  ? GLUCOSE 87 06/23/2021  ? CHOL 149 06/23/2021  ? TRIG 89.0 06/23/2021  ? HDL 64.70 06/23/2021  ? [place] 66 06/23/2021  ? ALT 19 06/23/2021  ? AST 27 06/23/2021  ? NA 144 06/23/2021  ? K 4.0 06/23/2021  ? CL 107 06/23/2021  ? CREATININE 1.26 (H) 06/23/2021  ? BUN 21 06/23/2021  ? CO2 28 06/23/2021  ? TSH 1.03 06/23/2021  ? INR 1.0 04/10/2021  ? ? ?CT Chest Wo Contrast ? ?Result Date: 06/29/2021 ?CLINICAL DATA:  Two episodes of hemoptysis. History of bladder cancer. EXAM: CT CHEST WITHOUT CONTRAST TECHNIQUE: Multidetector CT imaging of the chest was performed following the standard protocol without IV contrast. RADIATION DOSE REDUCTION: This exam was performed according to the departmental dose-optimization program which includes automated exposure control, adjustment of the mA and/or kV according to patient size and/or use of iterative reconstruction technique. COMPARISON:  04/18/2009 chest CT angiogram. FINDINGS: Cardiovascular: Normal heart size. No  significant pericardial effusion/thickening. Left anterior descending coronary atherosclerosis. Mildly atherosclerotic nonaneurysmal thoracic aorta. Normal caliber pulmonary arteries. Mediastinum/Nodes: Enlarged heterogeneous thyroid gland with suspected multiple hypodense thyroid nodules, largest 2.1 cm in the posterior right thyroid lobe. Unremarkable esophagus. No pathologically enlarged axillary, mediastinal or hilar lymph nodes, noting limited sensitivity for the detection of hilar adenopathy on this noncontrast study. Lungs/Pleura: No pneumothorax. No pleural effusion. Tiny solid 2 mm peripheral right upper lobe pulmonary nodule (series 3/image 27), stable since 04/18/2009 chest CT,  considered benign. No acute consolidative airspace disease, lung masses or additional significant pulmonary nodules. Scattered thin parenchymal bands and mild patchy subpleural reticulation at both lung bases, not substantially changed since 04/18/2009 chest CT and without significant regions of traction bronchiectasis or frank honeycombing, favoring nonspecific postinfectious/postinflammatory scarring. No central airway stenoses. Upper abdomen: Small hiatal hernia. Scattered simple liver cysts, largest 2.4 cm in the left liver lobe. Musculoskeletal: No aggressive appearing focal osseous lesions. Marked multilevel thoracic degenerative disc disease. Partially visualized posterior spinal fusion hardware on the right at L1-2. IMPRESSION: 1. No acute pulmonary disease. 2. Nonspecific scattered thin parenchymal bands and mild patchy subpleural reticulation at both lung bases, not substantially changed since 04/18/2009 chest CT and without significant regions of traction bronchiectasis or frank honeycombing, favoring nonspecific postinfectious/postinflammatory scarring. 3. Enlarged heterogeneous thyroid gland with suspected multiple hypodense thyroid nodules, largest 2.1 cm in the posterior right thyroid lobe. This has been evaluated on  previous imaging. (ref: J Am Coll Radiol. 2015 Feb;12(2): 143-50). See 11/30/2012 thyroid ultrasound report. 4. Small hiatal hernia. 5. One vessel coronary atherosclerosis. 6. Aortic Atherosclerosis (ICD10-I70.0). E

## 2021-09-22 NOTE — Assessment & Plan Note (Signed)
Cont w/good hydration 

## 2021-09-22 NOTE — Assessment & Plan Note (Signed)
Blue-Emu cream was recommended to use 2-3 times a day ? ?

## 2021-09-22 NOTE — Patient Instructions (Signed)
Blue-Emu cream -- use 2-3 times a day ? ?

## 2021-09-22 NOTE — Assessment & Plan Note (Addendum)
Dr Verlee Monte - Pulm ?[name] had a CT ?Cough since Aug 2022 ?Trial of Trelegy ?

## 2021-09-25 ENCOUNTER — Telehealth: Payer: Self-pay | Admitting: Internal Medicine

## 2021-09-25 NOTE — Telephone Encounter (Signed)
LVM for pt to rtn my call o r/s appt with [facility] on 10/03/21. Please r/s this appt if pt calls the office. ?

## 2021-10-03 ENCOUNTER — Ambulatory Visit: Payer: Medicare HMO

## 2021-10-06 ENCOUNTER — Ambulatory Visit (INDEPENDENT_AMBULATORY_CARE_PROVIDER_SITE_OTHER): Payer: Medicare HMO

## 2021-10-06 DIAGNOSIS — Z0000 Encounter for general adult medical examination without abnormal findings: Secondary | ICD-10-CM | POA: Diagnosis not present

## 2021-10-06 NOTE — Progress Notes (Addendum)
I connected with Mariah Farrell today by telephone and verified that I am speaking with the correct person using two identifiers. Location patient: home Location provider: work Persons participating in the virtual visit: patient, provider.   I discussed the limitations, risks, security and privacy concerns of performing an evaluation and management service by telephone and the availability of in person appointments. I also discussed with the patient that there may be a patient responsible charge related to this service. The patient expressed understanding and verbally consented to this telephonic visit.    Interactive audio and video telecommunications were attempted between this provider and patient, however failed, due to patient having technical difficulties OR patient did not have access to video capability.  We continued and completed visit with audio only.  Some vital signs may be absent or patient reported.   Time Spent with patient on telephone encounter: 30 minutes  Subjective:   Mariah Farrell is a 81 y.o. female who presents for Medicare Annual (Subsequent) preventive examination.  Review of Systems     Cardiac Risk Factors include: advanced age (>76mn, >>75women);dyslipidemia;hypertension     Objective:    There were no vitals filed for this visit. There is no height or weight on file to calculate BMI.     10/06/2021    3:38 PM 09/30/2020   11:58 AM 09/26/2020   11:50 AM 09/10/2015    1:33 PM 07/26/2014   10:31 AM 07/23/2014   10:14 AM 02/16/2014   10:09 AM  Advanced Directives  Does Patient Have a Medical Advance Directive? Yes Yes Yes Yes Yes Yes Yes  Type of Advance Directive Living will;Healthcare Power of Attorney Living will;Healthcare Power of Attorney Living will;[place]  Does patient want to make changes to medical advance directive? No - Patient declined No - Patient declined No  - Patient declined   No - Patient declined No - Patient declined  Copy of HEielson AFBin Chart? No - copy requested No - copy requested No - copy requested    No - copy requested    Current Medications (verified) Outpatient Encounter Medications as of 10/06/2021  Medication Sig   Biotin 5000 MCG TABS Take 1 tablet by mouth daily.   calcitonin, salmon, (MIACALCIN/FORTICAL) 200 UNIT/ACT nasal spray USE ONE SPRAY IN ONE NOSTRIL DAILY. ALTERNATE NOSTRILS   calcium-vitamin D (OSCAL WITH D) 500-200 MG-UNIT per tablet Take 1 tablet by mouth daily.   celecoxib (CELEBREX) 100 MG capsule TAKE ONE CAPSULE BY MOUTH DAILY AS NEEDED FOR MODERATE PAIN   cetirizine (ZYRTEC) 10 MG tablet Take 10 mg by mouth daily.   Cholecalciferol (VITAMIN D3) 2000 units capsule Take 1 capsule (2,000 Units total) by mouth daily.   diazepam (VALIUM) 5 MG tablet TAKE 1/2 TO 1 TABLET BY MOUTH EVERY 12 HOURS AS NEEDED FOR ANXIETY   Fluticasone-Umeclidin-Vilant (TRELEGY ELLIPTA) 100-62.5-25 MCG/ACT AEPB Inhale 1 puff into the lungs daily.   gabapentin (NEURONTIN) 300 MG capsule TAKE ONE CAPSULE BY MOUTH EVERY NIGHT AT BEDTIME   glucosamine-chondroitin 500-400 MG tablet Take 1 tablet by mouth daily.   latanoprost (XALATAN) 0.005 % ophthalmic solution SMARTSIG:In Eye(s)   levothyroxine (SYNTHROID) 25 MCG tablet TAKE ONE TABLET BY MOUTH DAILY BEFORE BREAKFAST   Multiple Vitamin (MULTIVITAMIN) tablet Take 1 tablet by mouth daily.   pantoprazole (PROTONIX) 40 MG tablet TAKE ONE TABLET BY MOUTH DAILY   vitamin C (ASCORBIC ACID) 500 MG tablet  Take 1,000 mg by mouth daily.    No facility-administered encounter medications on file as of 10/06/2021.    Allergies (verified) Doxycycline, Crab [shellfish allergy], Nexium [esomeprazole magnesium], Penicillins, and Prolia [denosumab]   History: Past Medical History:  Diagnosis Date   Arthritis    Cancer ([place]) 13 years ago   bladder   GERD (gastroesophageal reflux  disease)    Goiter    synthroid used to shrink   H/O hiatal hernia    Headache(784.0)    migraines in morning   PONV (postoperative nausea and vomiting)    Seasonal allergies    Past Surgical History:  Procedure Laterality Date   ARTHRODESIS FOOT WITH WEIL OSTEOTOMY Right 07/26/2014   Procedure: RIGHT HALLUX METATARSOPHALANGEAL JOINT ARTHRODESIS; RIGHT SECOND METATARSAL WEIL OSTEOTOMY; DORSAL CAPSULOTOMY EXTENSOR TENDON LENGTHENING;  Surgeon: Wylene Simmer, MD;  Location: [facility];  Service: Orthopedics;  Laterality: Right;   BLADDER SURGERY  13 years ago   BUNIONECTOMY Bilateral    COLONOSCOPY     LUMBAR FUSION  May 2014   L4-L5   ROTATOR CUFF REPAIR Bilateral ~2010, 2012   SPINAL FUSION     L1, L2   TONSILLECTOMY  as child   TOTAL KNEE ARTHROPLASTY Right 07/19/2012   Procedure: TOTAL KNEE ARTHROPLASTY;  Surgeon: Mauri Pole, MD;  Location: [facility];  Service: Orthopedics;  Laterality: Right;   TOTAL KNEE ARTHROPLASTY Left 08/01/2013   Procedure: LEFT TOTAL KNEE ARTHROPLASTY;  Surgeon: Mauri Pole, MD;  Location: [facility];  Service: Orthopedics;  Laterality: Left;   Family History  Problem Relation Age of Onset   Cancer Brother 33       pancreatic   Brain cancer Father    Social History   Socioeconomic History   Marital status: Married    Spouse name: Not on file   Number of children: Not on file   Years of education: Not on file   Highest education level: Not on file  Occupational History   Not on file  Tobacco Use   Smoking status: Former    Packs/day: 0.25    Years: 5.00    Pack years: 1.25    Types: Cigarettes    Quit date: 06/01/1964    Years since quitting: 57.3   Smokeless tobacco: Never  Vaping Use   Vaping Use: Never used  Substance and Sexual Activity   Alcohol use: Yes    Comment: 1-2 times weekly   Drug use: No   Sexual activity: Not on file  Other Topics Concern   Not on file  Social History Narrative   Not on file   Social  Determinants of Health   Financial Resource Strain: Low Risk    Difficulty of Paying Living Expenses: Not hard at all  Food Insecurity: No Food Insecurity   Worried About Charity fundraiser in the Last Year: Never true   [place] in the Last Year: Never true  Transportation Needs: No Transportation Needs   Lack of Transportation (Medical): No   Lack of Transportation (Non-Medical): No  Physical Activity: Sufficiently Active   Days of Exercise per Week: 5 days   Minutes of Exercise per Session: 30 min  Stress: No Stress Concern Present   Feeling of Stress : Not at all  Social Connections: Socially Integrated   Frequency of Communication with Friends and Family: More than three times a week   Frequency of Social Gatherings with Friends and Family: Twice a week  Attends Religious Services: More than 4 times per year   Active Member of Clubs or Organizations: Yes   Attends Music therapist: More than 4 times per year   Marital Status: Married    Tobacco Counseling Counseling given: Not Answered   Clinical Intake:  Pre-visit preparation completed: Yes  Pain : No/denies pain     Nutritional Risks: None Diabetes: No  How often do you need to have someone help you when you read instructions, pamphlets, or other written materials from your doctor or pharmacy?: 1 - Never What is the last grade level you completed in school?: 2 years of college  Diabetic? no  Interpreter Needed?: No  Information entered by :: Lisette Abu, LPN   Activities of Daily Living    10/06/2021    3:42 PM  In your present state of health, do you have any difficulty performing the following activities:  Hearing? 1  Vision? 0  Difficulty concentrating or making decisions? 0  Walking or climbing stairs? 0  Dressing or bathing? 0  Doing errands, shopping? 0  Preparing Food and eating ? N  Using the Toilet? N  In the past six months, have you accidently leaked urine? N   Do you have problems with loss of bowel control? N  Managing your Medications? N  Managing your Finances? N  Housekeeping or managing your Housekeeping? N    Patient Care Team: Plotnikov, Evie Lacks, MD as PCP - General Paralee Cancel, MD (Orthopedic Surgery) Newton Pigg, MD (Obstetrics and Gynecology) Richmond Campbell, MD (Gastroenterology) Roseanne Kaufman, MD (Orthopedic Surgery) Luberta Mutter, MD as Consulting Physician (Ophthalmology) Maryjane Hurter, MD as Consulting Physician (Pulmonary Disease) Maryjane Hurter, MD as Consulting Physician (Pulmonary Disease)  Indicate any recent Medical Services you may have received from other than [facility] providers in the past year (date may be approximate).     Assessment:   This is a routine wellness examination for Mariah Farrell.  Hearing/Vision screen Hearing Screening - Comments:: Patient has hearing difficulty and wears hearing aids. Vision Screening - Comments:: Patient does not wear any corrective lenses/contacts.   Eye exam done by: Luberta Mutter, MD.   Dietary issues and exercise activities discussed: Current Exercise Habits: Home exercise routine;Structured exercise class, Type of exercise: walking;treadmill;stretching;strength training/weights, Time (Minutes): 30, Frequency (Times/Week): 5, Weekly Exercise (Minutes/Week): 150, Intensity: Moderate   Goals Addressed               This Visit's Progress     Weight (lb) < 200 lb (90.7 kg) (pt-stated)        Continue on my weight journey by watching my diet and staying hydrated.      Depression Screen    10/06/2021    3:36 PM 06/23/2021    9:04 AM 09/26/2020   11:43 AM 06/20/2020    8:23 AM 01/04/2020    4:40 PM 03/29/2018    1:29 PM 03/24/2017   10:04 AM  PHQ 2/9 Scores  PHQ - 2 Score 0 0 0 0 0 0 0  PHQ- 9 Score  0         Fall Risk    10/06/2021    3:42 PM 06/23/2021    9:03 AM 09/26/2020   11:50 AM 06/20/2020    8:23 AM 01/04/2020    4:39 PM  Fall Risk    Falls in the past year? 1 1 0 0 0  Number falls in past yr: 0 0 0 0 0  Injury with Fall?  0 1 0 0 0  Comment  pt states she fell off a stool     Risk for fall due to :  History of fall(s);Impaired mobility No Fall Risks    Follow up Falls prevention discussed  Falls evaluation completed      FALL RISK PREVENTION PERTAINING TO THE HOME:  Any stairs in or around the home? Yes  If so, are there any without handrails? No  Home free of loose throw rugs in walkways, pet beds, electrical cords, etc? Yes  Adequate lighting in your home to reduce risk of falls? Yes   ASSISTIVE DEVICES UTILIZED TO PREVENT FALLS:  Life alert? No  Use of a cane, walker or w/c? No  Grab bars in the bathroom? Yes  Shower chair or bench in shower? No  Elevated toilet seat or a handicapped toilet? Yes   TIMED UP AND GO:  Was the test performed? No .  Length of time to ambulate 10 feet: n/a sec.   Appearance of gait: Patient not evaluated for gait during this visit.  Cognitive Function:        10/06/2021    3:44 PM  6CIT Screen  What Year? 0 points  What month? 0 points  What time? 0 points  Count back from 20 0 points  Months in reverse 0 points  Repeat phrase 0 points  Total Score 0 points    Immunizations Immunization History  Administered Date(s) Administered   Fluad Quad(high Dose 65+) 03/01/2020   H1N1 05/07/2008   Influenza Split 04/01/2012   Influenza Whole 03/04/2010   Influenza, High Dose Seasonal PF 03/04/2016, 03/10/2017, 02/22/2018, 01/27/2019   Influenza,inj,Quad PF,6+ Mos 02/20/2014   Influenza,inj,quad, With Preservative 03/01/2017   PFIZER Comirnaty(Gray Top)Covid-19 Tri-Sucrose Vaccine 09/18/2020   PFIZER(Purple Top)SARS-COV-2 Vaccination 06/26/2019, 07/17/2019, 02/10/2020   Pfizer Covid-19 Vaccine Bivalent Booster 75yr & up 03/11/2021   Pneumococcal Conjugate-13 12/18/2013   Pneumococcal Polysaccharide-23 03/11/2006, 04/03/2019   Td 02/11/2006   Tdap 03/04/2016    Zoster Recombinat (Shingrix) 02/22/2018, 04/25/2018   Zoster, Live 11/07/2008    TDAP status: Up to date  Flu Vaccine status: Up to date  Pneumococcal vaccine status: Up to date  Covid-19 vaccine status: Completed vaccines  Qualifies for Shingles Vaccine? Yes   Zostavax completed No   Shingrix Completed?: Yes  Screening Tests Health Maintenance  Topic Date Due   INFLUENZA VACCINE  12/30/2021   TETANUS/TDAP  03/04/2026   Pneumonia Vaccine 83 Years old  Completed   DEXA SCAN  Completed   COVID-19 Vaccine  Completed   Zoster Vaccines- Shingrix  Completed   HPV VACCINES  Aged Out    Health Maintenance  There are no preventive care reminders to display for this patient.  Colorectal cancer screening: No longer required.   Mammogram status: Completed 01/08/2021. Repeat every year  Bone Density status: Completed 01/08/2021. Results reflect: Bone density results: OSTEOPOROSIS. Repeat every 2-3 years.  Lung Cancer Screening: (Low Dose CT Chest recommended if Age 83-80years, 30 pack-year currently smoking OR have quit w/in 15years.) does not qualify.   Lung Cancer Screening Referral: no  Additional Screening:  Hepatitis C Screening: does not qualify; Completed no  Vision Screening: Recommended annual ophthalmology exams for early detection of glaucoma and other disorders of the eye. Is the patient up to date with their annual eye exam?  Yes  Who is the provider or what is the name of the office in which the patient attends annual eye exams? CLuberta Mutter MD.  If pt is not established with a provider, would they like to be referred to a provider to establish care? No .   Dental Screening: Recommended annual dental exams for proper oral hygiene  Community Resource Referral / Chronic Care Management: CRR required this visit?  No   CCM required this visit?  No      Plan:     I have personally reviewed and noted the following in the patient's chart:   Medical and  social history Use of alcohol, tobacco or illicit drugs  Current medications and supplements including opioid prescriptions.  Functional ability and status Nutritional status Physical activity Advanced directives List of other physicians Hospitalizations, surgeries, and ER visits in previous 12 months Vitals Screenings to include cognitive, depression, and falls Referrals and appointments  In addition, I have reviewed and discussed with patient certain preventive protocols, quality metrics, and best practice recommendations. A written personalized care plan for preventive services as well as general preventive health recommendations were provided to patient.     Sheral Flow, LPN   12/04/2829   Nurse Notes:  There were no vitals filed for this visit. There is no height or weight on file to calculate BMI.   Medical screening examination/treatment/procedure(s) were performed by non-physician practitioner and as supervising physician I was immediately available for consultation/collaboration.  I agree with above. Lew Dawes, MD

## 2021-10-06 NOTE — Patient Instructions (Signed)
Ms. Maners , ?Thank you for taking time to come for your Medicare Wellness Visit. I appreciate your ongoing commitment to your health goals. Please review the following plan we discussed and let me know if I can assist you in the future.  ? ?Screening recommendations/referrals: ?Colonoscopy: Discontinued ?Mammogram: 01/08/2021; due every year ?Bone Density: 01/08/2021; due every 2-3 years ?Recommended yearly ophthalmology/optometry visit for glaucoma screening and checkup ?Recommended yearly dental visit for hygiene and checkup ? ?Vaccinations: ?Influenza vaccine: 03/29/2021 ?Pneumococcal vaccine: 03/11/2006, 12/18/2013, 04/03/2019 ?Tdap vaccine: 03/04/2016; due every 10 years ?Shingles vaccine: 02/22/2018, 04/25/2018   ?Covid-19: 06/26/2019, 07/17/2019, 02/10/2020, 09/18/2020, 03/11/2021 ? ?Advanced directives: Yes; Please bring a copy of your health care power of attorney and living will to the office at your convenience. ? ?Conditions/risks identified: Yes ? ?Next appointment: Please schedule your next Medicare Wellness Visit with your Nurse Health Advisor in 1 year by calling [phone]. ? ? ?Preventive Care 83 Years and Older, Female ?Preventive care refers to lifestyle choices and visits with your health care provider that can promote health and wellness. ?What does preventive care include? ?A yearly physical exam. This is also called an annual well check. ?Dental exams once or twice a year. ?Routine eye exams. Ask your health care provider how often you should have your eyes checked. ?Personal lifestyle choices, including: ?Daily care of your teeth and gums. ?Regular physical activity. ?Eating a healthy diet. ?Avoiding tobacco and drug use. ?Limiting alcohol use. ?Practicing safe sex. ?Taking low-dose aspirin every day. ?Taking vitamin and mineral supplements as recommended by your health care provider. ?What happens during an annual well check? ?The services and screenings done by your health care provider during  your annual well check will depend on your age, overall health, lifestyle risk factors, and family history of disease. ?Counseling  ?Your health care provider may ask you questions about your: ?Alcohol use. ?Tobacco use. ?Drug use. ?Emotional well-being. ?Home and relationship well-being. ?Sexual activity. ?Eating habits. ?History of falls. ?Memory and ability to understand (cognition). ?Work and work Statistician. ?Reproductive health. ?Screening  ?You may have the following tests or measurements: ?Height, weight, and BMI. ?Blood pressure. ?Lipid and cholesterol levels. These may be checked every 5 years, or more frequently if you are over 34 years old. ?Skin check. ?Lung cancer screening. You may have this screening every year starting at age 65 if you have a 30-pack-year history of smoking and currently smoke or have quit within the past 15 years. ?Fecal occult blood test (FOBT) of the stool. You may have this test every year starting at age 22. ?Flexible sigmoidoscopy or colonoscopy. You may have a sigmoidoscopy every 5 years or a colonoscopy every 10 years starting at age 37. ?Hepatitis C blood test. ?Hepatitis B blood test. ?Sexually transmitted disease (STD) testing. ?Diabetes screening. This is done by checking your blood sugar (glucose) after you have not eaten for a while (fasting). You may have this done every 1-3 years. ?Bone density scan. This is done to screen for osteoporosis. You may have this done starting at age 37. ?Mammogram. This may be done every 1-2 years. Talk to your health care provider about how often you should have regular mammograms. ?Talk with your health care provider about your test results, treatment options, and if necessary, the need for more tests. ?Vaccines  ?Your health care provider may recommend certain vaccines, such as: ?Influenza vaccine. This is recommended every year. ?Tetanus, diphtheria, and acellular pertussis (Tdap, Td) vaccine. You may need a Td booster every  10  years. ?Zoster vaccine. You may need this after age 59. ?Pneumococcal 13-valent conjugate (PCV13) vaccine. One dose is recommended after age 71. ?Pneumococcal polysaccharide (PPSV23) vaccine. One dose is recommended after age 79. ?Talk to your health care provider about which screenings and vaccines you need and how often you need them. ?This information is not intended to replace advice given to you by your health care provider. Make sure you discuss any questions you have with your health care provider. ?Document Released: 06/14/2015 Document Revised: 02/05/2016 Document Reviewed: 03/19/2015 ?Elsevier Interactive Patient Education ? 2017 [employer]. ? ?Fall Prevention in the Home ?Falls can cause injuries. They can happen to people of all ages. There are many things you can do to make your home safe and to help prevent falls. ?What can I do on the outside of my home? ?Regularly fix the edges of walkways and driveways and fix any cracks. ?Remove anything that might make you trip as you walk through a door, such as a raised step or threshold. ?Trim any bushes or trees on the path to your home. ?Use bright outdoor lighting. ?Clear any walking paths of anything that might make someone trip, such as rocks or tools. ?Regularly check to see if handrails are loose or broken. Make sure that both sides of any steps have handrails. ?Any raised decks and porches should have guardrails on the edges. ?Have any leaves, snow, or ice cleared regularly. ?Use sand or salt on walking paths during winter. ?Clean up any spills in your garage right away. This includes oil or grease spills. ?What can I do in the bathroom? ?Use night lights. ?Install grab bars by the toilet and in the tub and shower. Do not use towel bars as grab bars. ?Use non-skid mats or decals in the tub or shower. ?If you need to sit down in the shower, use a plastic, non-slip stool. ?Keep the floor dry. Clean up any water that spills on the floor as soon as it  happens. ?Remove soap buildup in the tub or shower regularly. ?Attach bath mats securely with double-sided non-slip rug tape. ?Do not have throw rugs and other things on the floor that can make you trip. ?What can I do in the bedroom? ?Use night lights. ?Make sure that you have a light by your bed that is easy to reach. ?Do not use any sheets or blankets that are too big for your bed. They should not hang down onto the floor. ?Have a firm chair that has side arms. You can use this for support while you get dressed. ?Do not have throw rugs and other things on the floor that can make you trip. ?What can I do in the kitchen? ?Clean up any spills right away. ?Avoid walking on wet floors. ?Keep items that you use a lot in easy-to-reach places. ?If you need to reach something above you, use a strong step stool that has a grab bar. ?Keep electrical cords out of the way. ?Do not use floor polish or wax that makes floors slippery. If you must use wax, use non-skid floor wax. ?Do not have throw rugs and other things on the floor that can make you trip. ?What can I do with my stairs? ?Do not leave any items on the stairs. ?Make sure that there are handrails on both sides of the stairs and use them. Fix handrails that are broken or loose. Make sure that handrails are as long as the stairways. ?Check any carpeting to make  sure that it is firmly attached to the stairs. Fix any carpet that is loose or worn. ?Avoid having throw rugs at the top or bottom of the stairs. If you do have throw rugs, attach them to the floor with carpet tape. ?Make sure that you have a light switch at the top of the stairs and the bottom of the stairs. If you do not have them, ask someone to add them for you. ?What else can I do to help prevent falls? ?Wear shoes that: ?Do not have high heels. ?Have rubber bottoms. ?Are comfortable and fit you well. ?Are closed at the toe. Do not wear sandals. ?If you use a stepladder: ?Make sure that it is fully opened.  Do not climb a closed stepladder. ?Make sure that both sides of the stepladder are locked into place. ?Ask someone to hold it for you, if possible. ?Clearly mark and make sure that you can see: ?Any grab bars

## 2021-10-22 DIAGNOSIS — H2513 Age-related nuclear cataract, bilateral: Secondary | ICD-10-CM | POA: Diagnosis not present

## 2021-10-22 DIAGNOSIS — H401131 Primary open-angle glaucoma, bilateral, mild stage: Secondary | ICD-10-CM | POA: Diagnosis not present

## 2021-10-22 DIAGNOSIS — H52203 Unspecified astigmatism, bilateral: Secondary | ICD-10-CM | POA: Diagnosis not present

## 2021-12-01 DIAGNOSIS — H1789 Other corneal scars and opacities: Secondary | ICD-10-CM | POA: Diagnosis not present

## 2021-12-31 ENCOUNTER — Ambulatory Visit: Payer: Self-pay

## 2021-12-31 ENCOUNTER — Ambulatory Visit: Payer: Medicare HMO | Admitting: Specialist

## 2021-12-31 ENCOUNTER — Encounter: Payer: Self-pay | Admitting: Specialist

## 2021-12-31 VITALS — BP 117/70 | HR 76 | Ht 60.0 in | Wt 189.0 lb

## 2021-12-31 DIAGNOSIS — M542 Cervicalgia: Secondary | ICD-10-CM | POA: Diagnosis not present

## 2021-12-31 DIAGNOSIS — M5481 Occipital neuralgia: Secondary | ICD-10-CM | POA: Diagnosis not present

## 2021-12-31 DIAGNOSIS — R200 Anesthesia of skin: Secondary | ICD-10-CM

## 2021-12-31 DIAGNOSIS — M4802 Spinal stenosis, cervical region: Secondary | ICD-10-CM

## 2021-12-31 DIAGNOSIS — M4722 Other spondylosis with radiculopathy, cervical region: Secondary | ICD-10-CM | POA: Diagnosis not present

## 2021-12-31 DIAGNOSIS — R202 Paresthesia of skin: Secondary | ICD-10-CM | POA: Diagnosis not present

## 2021-12-31 NOTE — Progress Notes (Signed)
Office Visit Note   Patient: Mariah Farrell           Date of Birth: 1941-04-04           MRN: [id number] Visit Date: 12/31/2021              Requested by: Cassandria Anger, MD [place],  [place] [address] PCP: Plotnikov, Evie Lacks, MD   Assessment & Plan: Visit Diagnoses:  1. Cervicalgia   2. Spinal stenosis of cervical region   3. Numbness and tingling of right arm   4. Other spondylosis with radiculopathy, cervical region   5. Cervico-occipital neuralgia     Plan: Avoid overhead lifting and overhead use of the arms. Do not lift greater than 5 lbs. Adjust head rest in vehicle to prevent hyperextension if rear ended. Take extra precautions to avoid falling. MRI of the cervical spine  Follow-Up Instructions: Return in about 4 weeks (around 01/28/2022) for appt on the 23rd to be kept.   Orders:  Orders Placed This Encounter  Procedures  . XR Cervical Spine 2 or 3 views   No orders of the defined types were placed in this encounter.     Procedures: No procedures performed   Clinical Data: No additional findings.   Subjective: Chief Complaint  Patient presents with  . Neck - Pain    HPI  Review of Systems   Objective: Vital Signs: BP 117/70 (BP Location: Left Arm, Patient Position: Sitting)   Pulse 76   Ht 5' (1.524 m)   Wt 189 lb (85.7 kg)   BMI 36.91 kg/m   Physical Exam Musculoskeletal:     Lumbar back: Negative right straight leg raise test and negative left straight leg raise test.   Back Exam   Tenderness  The patient is experiencing tenderness in the cervical.  Range of Motion  Extension:  abnormal  Flexion:  normal  Lateral bend right:  abnormal  Lateral bend left:  abnormal  Rotation right:  abnormal  Rotation left:  abnormal   Muscle Strength  Right Quadriceps:  5/5  Left Quadriceps:  5/5  Right Hamstrings:  5/5  Left Hamstrings:  5/5   Tests  Straight leg raise right: negative Straight leg  raise left: negative  Other  Toe walk: normal Heel walk: normal Erythema: no back redness Scars: present    Specialty Comments:  No specialty comments available.  Imaging: No results found.   PMFS History: Patient Active Problem List   Diagnosis Date Noted  . Furuncle of vulva 09/22/2021  . History of COVID-19 09/22/2021  . Post-COVID chronic cough 09/22/2021  . Cramp in limb 06/23/2021  . Arthritis of hand 05/15/2021  . Hemoptysis 04/10/2021  . Bleeding from mouth 04/10/2021  . DOE (dyspnea on exertion) 06/20/2020  . Ingrown toenail of right foot 06/20/2020  . Pain in left foot 06/18/2020  . Cervical radiculopathy 05/06/2020  . DDD (degenerative disc disease), cervical 05/06/2020  . Situational depression 01/04/2020  . Renal insufficiency 05/10/2019  . Somatic dysfunction of right sacroiliac joint 02/27/2019  . Lumbar spondylosis 08/04/2018  . Vitamin D deficiency 07/17/2018  . Essential hypertension 06/18/2018  . Gastroesophageal reflux disease without esophagitis 06/18/2018  . Hypothyroidism (acquired) 06/18/2018  . Mixed hyperlipidemia 06/18/2018  . Severe protein-calorie malnutrition ([place]) 06/18/2018  . Dementia with behavioral disturbance ([place]) 06/17/2018  . Schizophrenia ([place]) 06/17/2018  . History of lumbar fusion 06/14/2018  . Bipolar disorder, unspecified ([place]) 04/08/2018  . Breast mass,  right 08/10/2017  . Lumbar post-laminectomy syndrome 06/15/2017  . 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 Stress at home 03/24/2017  . Pre-operative examination for internal medicine 10/06/2016  . RLQ abdominal pain 05/16/2016  . Need for prophylactic vaccination and inoculation against influenza 03/04/2016  . Allergic rhinitis 02/20/2014  . Osteoporosis 12/18/2013  . Expected blood loss anemia 07/20/2012  . Obesity, unspecified 07/20/2012  . S/P left TKA 07/19/2012  . Tinnitus 09/28/2011  . Well adult exam 09/28/2011  . Knee pain 09/28/2011   . Goiter 04/18/2009  . TOBACCO USE, QUIT 04/18/2009  . Chest pain, atypical 04/17/2009  . Osteoarthritis 04/11/2007  . Lumbar back pain 04/11/2007  . COLONIC POLYPS, HX OF 04/11/2007   Past Medical History:  Diagnosis Date  . Arthritis   . Cancer ([place]) 13 years ago   bladder  . GERD (gastroesophageal reflux disease)   . Goiter    synthroid used to shrink  . H/O hiatal hernia   . Headache(784.0)    migraines in morning  . PONV (postoperative nausea and vomiting)   . Seasonal allergies     Family History  Problem Relation Age of Onset  . Cancer Brother 54       pancreatic  . Brain cancer Father     Past Surgical History:  Procedure Laterality Date  . ARTHRODESIS FOOT WITH WEIL OSTEOTOMY Right 07/26/2014   Procedure: RIGHT HALLUX METATARSOPHALANGEAL JOINT ARTHRODESIS; RIGHT SECOND METATARSAL WEIL OSTEOTOMY; DORSAL CAPSULOTOMY EXTENSOR TENDON LENGTHENING;  Surgeon: Wylene Simmer, MD;  Location: [facility];  Service: Orthopedics;  Laterality: Right;  . BLADDER SURGERY  13 years ago  . BUNIONECTOMY Bilateral   . COLONOSCOPY    . LUMBAR FUSION  May 2014   L4-L5  . ROTATOR CUFF REPAIR Bilateral ~2010, 2012  . SPINAL FUSION     L1, L2  . TONSILLECTOMY  as child  . TOTAL KNEE ARTHROPLASTY Right 07/19/2012   Procedure: TOTAL KNEE ARTHROPLASTY;  Surgeon: Mauri Pole, MD;  Location: [facility];  Service: Orthopedics;  Laterality: Right;  . TOTAL KNEE ARTHROPLASTY Left 08/01/2013   Procedure: LEFT TOTAL KNEE ARTHROPLASTY;  Surgeon: Mauri Pole, MD;  Location: [facility];  Service: Orthopedics;  Laterality: Left;   Social History   Occupational History  . Not on file  Tobacco Use  . Smoking status: Former    Packs/day: 0.25    Years: 5.00    Total pack years: 1.25    Types: Cigarettes    Quit date: 06/01/1964    Years since quitting: 57.6  . Smokeless tobacco: Never  Vaping Use  . Vaping Use: Never used  Substance and Sexual Activity  . Alcohol use: Yes     Comment: 1-2 times weekly  . Drug use: No  . Sexual activity: Not on file

## 2021-12-31 NOTE — Patient Instructions (Addendum)
Avoid overhead lifting and overhead use of the arms. Do not lift greater than 5 lbs. Adjust head rest in vehicle to prevent hyperextension if rear ended. Take extra precautions to avoid falling. MRI of the cervical spine Prednisone 10 mg po daily Stop cerebrex.

## 2022-01-09 ENCOUNTER — Telehealth: Payer: Self-pay | Admitting: Physical Medicine and Rehabilitation

## 2022-01-09 NOTE — Telephone Encounter (Signed)
Patient's husband Mariah Farrell calling to check the status of the referral to [facility] from [place]. He states its been two weeks almost since [place] had seen [place] and they have not gotten a call yet.    If Mariah Farrell can not do the injection they are wanting to be referred somewhere else.    Please call Mariah Farrell at [phone]

## 2022-01-12 ENCOUNTER — Ambulatory Visit
Admission: RE | Admit: 2022-01-12 | Discharge: 2022-01-12 | Disposition: A | Discharge status: Home / Self Care | Payer: Medicare HMO | Source: Ambulatory Visit | Attending: Specialist | Admitting: Specialist

## 2022-01-12 DIAGNOSIS — M542 Cervicalgia: Secondary | ICD-10-CM

## 2022-01-12 DIAGNOSIS — R200 Anesthesia of skin: Secondary | ICD-10-CM

## 2022-01-12 DIAGNOSIS — M4722 Other spondylosis with radiculopathy, cervical region: Secondary | ICD-10-CM

## 2022-01-12 DIAGNOSIS — M4802 Spinal stenosis, cervical region: Secondary | ICD-10-CM | POA: Diagnosis not present

## 2022-01-12 DIAGNOSIS — M47812 Spondylosis without myelopathy or radiculopathy, cervical region: Secondary | ICD-10-CM | POA: Diagnosis not present

## 2022-01-13 NOTE — Addendum Note (Signed)
Addended by: Basil Dess on: 01/13/2022 12:46 PM   Modules accepted: Orders

## 2022-01-14 DIAGNOSIS — Z1231 Encounter for screening mammogram for malignant neoplasm of breast: Secondary | ICD-10-CM | POA: Diagnosis not present

## 2022-01-15 ENCOUNTER — Other Ambulatory Visit: Payer: Self-pay | Admitting: Specialist

## 2022-01-15 DIAGNOSIS — M5412 Radiculopathy, cervical region: Secondary | ICD-10-CM

## 2022-01-20 ENCOUNTER — Telehealth: Payer: Self-pay | Admitting: Specialist

## 2022-01-20 NOTE — Telephone Encounter (Signed)
Pt husband called wondering if anyone ever got to scheduling his wife a neck injection/ If not please call   Cb [phone]

## 2022-01-21 ENCOUNTER — Other Ambulatory Visit: Payer: Self-pay | Admitting: Internal Medicine

## 2022-01-21 ENCOUNTER — Ambulatory Visit: Payer: Medicare HMO | Admitting: Specialist

## 2022-01-23 DIAGNOSIS — M47812 Spondylosis without myelopathy or radiculopathy, cervical region: Secondary | ICD-10-CM | POA: Diagnosis not present

## 2022-01-26 ENCOUNTER — Ambulatory Visit: Payer: Medicare HMO | Admitting: Specialist

## 2022-01-26 ENCOUNTER — Encounter: Payer: Self-pay | Admitting: Specialist

## 2022-01-26 VITALS — BP 114/73 | HR 79 | Ht 60.0 in | Wt 189.0 lb

## 2022-01-26 DIAGNOSIS — M5481 Occipital neuralgia: Secondary | ICD-10-CM

## 2022-01-26 DIAGNOSIS — M4722 Other spondylosis with radiculopathy, cervical region: Secondary | ICD-10-CM

## 2022-01-26 MED ORDER — DICLOFENAC POTASSIUM 50 MG PO TABS: 50.0000 mg | ORAL_TABLET | Freq: Two times a day (BID) | ORAL | 3 refills | Status: DC

## 2022-01-26 NOTE — Patient Instructions (Signed)
Avoid overhead lifting and overhead use of the arms. Do not lift greater than 5 lbs. Adjust head rest in vehicle to prevent hyperextension if rear ended. Take extra precautions to avoid falling. Have injection of the deeper greater occipital n by Dr. Nelva Bush and EMG/NCV with Dr. Ernestina Patches, I will post emg on my chart.

## 2022-01-26 NOTE — Progress Notes (Signed)
Office Visit Note   Patient: Mariah Farrell           Date of Birth: 1941-05-22           MRN: [id number] Visit Date: 01/26/2022              Requested by: Cassandria Anger, MD [place],  [place] [address] PCP: Plotnikov, Evie Lacks, MD   Assessment & Plan: Visit Diagnoses:  1. Cervico-occipital neuralgia   2. Other spondylosis with radiculopathy, cervical region     Plan: Avoid overhead lifting and overhead use of the arms. Do not lift greater than 5 lbs. Adjust head rest in vehicle to prevent hyperextension if rear ended. Take extra precautions to avoid falling. Have injection of the deeper greater occipital n by Dr. Nelva Bush and EMG/NCV with Dr. Ernestina Patches, I will post emg on my chart. Cataflam 50 mg up to BID with meal or snack  Follow-Up Instructions: Return in about 4 weeks (around 02/23/2022) for overbook appt..   Orders:  No orders of the defined types were placed in this encounter.  Meds ordered this encounter  Medications   diclofenac (CATAFLAM) 50 MG tablet    Sig: Take 1 tablet (50 mg total) by mouth 2 (two) times daily.    Dispense:  60 tablet    Refill:  3      Procedures: No procedures performed   Clinical Data: No additional findings.   Subjective: Chief Complaint  Patient presents with   Neck - Follow-up    MRI Review    81 year old right handed female with diffuse spondylosis of the cervical spine. She is having continuous pain right cervicooccipital area. Underwent a perpheral right occipital nerve injection. This did not help as much as she hoped.    Review of Systems  Constitutional: Negative.   HENT: Negative.    Eyes: Negative.   Respiratory: Negative.    Cardiovascular: Negative.   Gastrointestinal: Negative.   Endocrine: Negative.   Genitourinary: Negative.   Musculoskeletal: Negative.   Skin: Negative.   Allergic/Immunologic: Negative.   Neurological: Negative.   Hematological: Negative.    Psychiatric/Behavioral: Negative.       Objective: Vital Signs: BP 114/73 (BP Location: Left Arm, Patient Position: Sitting)   Pulse 79   Ht 5' (1.524 m)   Wt 189 lb (85.7 kg)   BMI 36.91 kg/m   Physical Exam Constitutional:      Appearance: She is well-developed.  HENT:     Head: Normocephalic and atraumatic.  Eyes:     Pupils: Pupils are equal, round, and reactive to light.  Pulmonary:     Effort: Pulmonary effort is normal.     Breath sounds: Normal breath sounds.  Abdominal:     General: Bowel sounds are normal.     Palpations: Abdomen is soft.  Musculoskeletal:     Cervical back: Normal range of motion and neck supple.     Lumbar back: Negative left straight leg raise test.  Skin:    General: Skin is warm and dry.  Neurological:     Mental Status: She is alert and oriented to person, place, and time.  Psychiatric:        Behavior: Behavior normal.        Thought Content: Thought content normal.        Judgment: Judgment normal.    Back Exam   Tenderness  The patient is experiencing tenderness in the cervical.  Range of  Motion  Extension:  abnormal  Flexion:  abnormal  Lateral bend right:  abnormal  Lateral bend left:  abnormal  Rotation right:  abnormal  Rotation left:  abnormal   Muscle Strength  Right Quadriceps:  5/5  Left Quadriceps:  5/5  Right Hamstrings:  5/5  Left Hamstrings:  5/5   Tests  Straight leg raise left: negative  Other  Toe walk: normal Heel walk: normal     Specialty Comments:  No specialty comments available.  Imaging: No results found.   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   Cramp in limb 06/23/2021   Arthritis of hand 05/15/2021   Hemoptysis 04/10/2021   Bleeding from mouth 04/10/2021   DOE (dyspnea on exertion) 06/20/2020   Ingrown toenail of right foot 06/20/2020   Pain  in left foot 06/18/2020   Cervical radiculopathy 05/06/2020   DDD (degenerative disc disease), cervical 05/06/2020   Situational depression 01/04/2020   Renal insufficiency 05/10/2019   Somatic dysfunction of right sacroiliac joint 02/27/2019   Lumbar spondylosis 08/04/2018   Vitamin D deficiency 07/17/2018   Essential hypertension 06/18/2018   Gastroesophageal reflux disease without esophagitis 06/18/2018   Hypothyroidism (acquired) 06/18/2018   Mixed hyperlipidemia 06/18/2018   Severe protein-calorie malnutrition ([place]) 06/18/2018   Dementia with behavioral disturbance ([place]) 06/17/2018   Schizophrenia ([place]) 06/17/2018   History of lumbar fusion 06/14/2018   Bipolar disorder, unspecified ([place]) 04/08/2018   Breast mass, right 08/10/2017   Lumbar post-laminectomy syndrome 06/15/2017   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   Stress at home 03/24/2017   Pre-operative examination for internal medicine 10/06/2016   RLQ abdominal pain 05/16/2016   Need for prophylactic vaccination and inoculation against influenza 03/04/2016   Allergic rhinitis 02/20/2014   Osteoporosis 12/18/2013   Expected blood loss anemia 07/20/2012   Obesity, unspecified 07/20/2012   S/P left TKA 07/19/2012   Tinnitus 09/28/2011   Well adult exam 09/28/2011   Knee pain 09/28/2011   Goiter 04/18/2009   TOBACCO USE, QUIT 04/18/2009   Chest pain, atypical 04/17/2009   Osteoarthritis 04/11/2007   Lumbar back pain 04/11/2007   COLONIC POLYPS, HX OF 04/11/2007   Past Medical History:  Diagnosis Date   Arthritis    Cancer ([place]) 13 years ago   bladder   GERD (gastroesophageal reflux disease)    Goiter    synthroid used to shrink   H/O hiatal hernia    Headache(784.0)    migraines in morning   PONV (postoperative nausea and vomiting)    Seasonal allergies     Family History  Problem Relation Age of Onset   Cancer Brother 20       pancreatic   Brain cancer Father     Past Surgical  History:  Procedure Laterality Date   ARTHRODESIS FOOT WITH WEIL OSTEOTOMY Right 07/26/2014   Procedure: RIGHT HALLUX METATARSOPHALANGEAL JOINT ARTHRODESIS; RIGHT SECOND METATARSAL WEIL OSTEOTOMY; DORSAL CAPSULOTOMY EXTENSOR TENDON LENGTHENING;  Surgeon: Wylene Simmer, MD;  Location: [facility];  Service: Orthopedics;  Laterality: Right;   BLADDER SURGERY  13 years ago   BUNIONECTOMY Bilateral    COLONOSCOPY     LUMBAR FUSION  May 2014   L4-L5   ROTATOR CUFF REPAIR Bilateral ~2010, 2012   SPINAL FUSION     L1, L2   TONSILLECTOMY  as child   TOTAL KNEE ARTHROPLASTY Right  07/19/2012   Procedure: TOTAL KNEE ARTHROPLASTY;  Surgeon: Mauri Pole, MD;  Location: [facility];  Service: Orthopedics;  Laterality: Right;   TOTAL KNEE ARTHROPLASTY Left 08/01/2013   Procedure: LEFT TOTAL KNEE ARTHROPLASTY;  Surgeon: Mauri Pole, MD;  Location[facility];  Service: Orthopedics;  Laterality: Left;   Social History   Occupational History   Not on file  Tobacco Use   Smoking status: Former    Packs/day: 0.25    Years: 5.00    Total pack years: 1.25    Types: Cigarettes    Quit date: 06/01/1964    Years since quitting: 57.6   Smokeless tobacco: Never  Vaping Use   Vaping Use: Never used  Substance and Sexual Activity   Alcohol use: Yes    Comment: 1-2 times weekly   Drug use: No   Sexual activity: Not on file

## 2022-01-28 ENCOUNTER — Ambulatory Visit: Payer: Medicare HMO | Admitting: Physical Medicine and Rehabilitation

## 2022-01-28 ENCOUNTER — Encounter: Payer: Self-pay | Admitting: Physical Medicine and Rehabilitation

## 2022-01-28 DIAGNOSIS — R202 Paresthesia of skin: Secondary | ICD-10-CM

## 2022-01-28 NOTE — Progress Notes (Signed)
Pt state cramps in both hands. Pt state she has some neck and head pain. Pt state when playing cards or uses her hands can cause cramps. Pt state she takes over the counter pain meds ot help ease her pain. Pt state shr right handed.  Numeric Pain Rating Scale and Functional Assessment Average Pain 0   In the last MONTH (on 0-10 scale) has pain interfered with the following?  1. General activity like being  able to carry out your everyday physical activities such as walking, climbing stairs, carrying groceries, or moving a chair?  Rating(8)    -BT,

## 2022-01-29 DIAGNOSIS — H353111 Nonexudative age-related macular degeneration, right eye, early dry stage: Secondary | ICD-10-CM | POA: Diagnosis not present

## 2022-01-29 DIAGNOSIS — H2513 Age-related nuclear cataract, bilateral: Secondary | ICD-10-CM | POA: Diagnosis not present

## 2022-01-29 DIAGNOSIS — H1789 Other corneal scars and opacities: Secondary | ICD-10-CM | POA: Diagnosis not present

## 2022-01-29 DIAGNOSIS — H52203 Unspecified astigmatism, bilateral: Secondary | ICD-10-CM | POA: Diagnosis not present

## 2022-01-30 NOTE — Procedures (Signed)
EMG & NCV Findings: Evaluation of the left median motor, the right median motor, and the left ulnar sensory nerves showed reduced amplitude (L3.8, R4.7, L10.7 V).  The right median (across palm) sensory nerve showed prolonged distal peak latency (Palm, 2.1 ms).  All remaining nerves (as indicated in the following tables) were within normal limits.  Left vs. Right side comparison data for the ulnar motor nerve indicates abnormal L-R latency difference (1.1 ms).  All remaining left vs. right side differences were within normal limits.    All examined muscles (as indicated in the following table) showed no evidence of electrical instability.    Impression: Essentially NORMAL electrodiagnostic study of both upper limbs.  There is no significant electrodiagnostic evidence of nerve entrapment, brachial plexopathy or cervical radiculopathy.  Reduced amplitudes noted in the findings likely [facility] technical artifact with significant osteoarthritis of the hands.  As you know, purely sensory or demyelinating radiculopathies and chemical radiculitis may not be detected with this particular electrodiagnostic study.  Recommendations: 1.  Follow-up with referring physician. 2.  Continue current management of symptoms.  Discussed less bowel issues with magnesium glycinate. 3.  Dr. Louanne Skye may wish to consult with Dr. Sarina Ill at [facility] for possible headache and occipital headache management.  ___________________________ [place] Board Certified, American Board of Physical Medicine and Rehabilitation    Nerve Conduction Studies Anti Sensory Summary Table   Stim Site NR Peak (ms) Norm Peak (ms) P-T Amp (V) Norm P-T Amp Site1 Site2 Delta-P (ms) Dist (cm) Vel (m/s) Norm Vel (m/s)  Left Median Acr Palm Anti Sensory (2nd Digit)  31.9C  Wrist    3.5 <3.6 35.9 >10        Right Median Acr Palm Anti Sensory (2nd Digit)  31C  Wrist    3.6 <3.6 33.2 >10 Wrist Palm 1.5 0.0     Palm    *2.1 <2.0 29.6         Left Radial Anti Sensory (Base 1st Digit)  31.4C  Wrist    2.5 <3.1 23.4  Wrist Base 1st Digit 2.5 0.0    Right Radial Anti Sensory (Base 1st Digit)  31.2C  Wrist    2.7 <3.1 21.2  Wrist Base 1st Digit 2.7 0.0    Left Ulnar Anti Sensory (5th Digit)  31.8C  Wrist    3.7 <3.7 *10.7 >15.0 Wrist 5th Digit 3.7 14.0 38 >38  Right Ulnar Anti Sensory (5th Digit)  31.4C  Wrist    3.6 <3.7 18.3 >15.0 Wrist 5th Digit 3.6 14.0 39 >38   Motor Summary Table   Stim Site NR Onset (ms) Norm Onset (ms) O-P Amp (mV) Norm O-P Amp Site1 Site2 Delta-0 (ms) Dist (cm) Vel (m/s) Norm Vel (m/s)  Left Median Motor (Abd Poll Brev)  31.8C  Wrist    3.2 <4.2 *3.8 >5 Elbow Wrist 4.1 21.0 51 >50  Elbow    7.3  3.7         Right Median Motor (Abd Poll Brev)  31.5C  Wrist    3.4 <4.2 *4.7 >5 Elbow Wrist 4.0 21.0 53 >50  Elbow    7.4  4.4         Left Ulnar Motor (Abd Dig Min)  31.9C  Wrist    4.2 <4.2 5.9 >3 B Elbow Wrist 3.4 19.0 56 >53  B Elbow    7.6  5.2  A Elbow B Elbow 1.5 10.0 67 >53  A Elbow    9.1  4.8         Right Ulnar Motor (Abd Dig Min)  31.4C  Wrist    3.1 <4.2 4.7 >3 B Elbow Wrist 3.8 20.0 53 >53  B Elbow    6.9  4.4  A Elbow B Elbow 1.4 10.0 71 >53  A Elbow    8.3  4.1          EMG   Side Muscle Nerve Root Ins Act Fibs Psw Amp Dur Poly Recrt Int [name] Comment  Left 1stDorInt Ulnar C8-T1 Nml Nml Nml Nml Nml 0 Nml Nml   Left Abd Poll Brev Median C8-T1 Nml Nml Nml Nml Nml 0 Nml Nml   Left ExtDigCom   Nml Nml Nml Nml Nml 0 Nml Nml   Left Triceps Radial C6-7-8 Nml Nml Nml Nml Nml 0 Nml Nml   Left Deltoid Axillary C5-6 Nml Nml Nml Nml Nml 0 Nml Nml     Nerve Conduction Studies Anti Sensory Left/Right Comparison   Stim Site L Lat (ms) R Lat (ms) L-R Lat (ms) L Amp (V) R Amp (V) L-R Amp (%) Site1 Site2 L Vel (m/s) R Vel (m/s) L-R Vel (m/s)  Median Acr Palm Anti Sensory (2nd Digit)  31.9C  Wrist 3.5 3.6 0.1 35.9 33.2 7.5       Radial Anti Sensory (Base 1st  Digit)  31.4C  Wrist 2.5 2.7 0.2 23.4 21.2 9.4 Wrist Base 1st Digit     Ulnar Anti Sensory (5th Digit)  31.8C  Wrist 3.7 3.6 0.1 *10.7 18.3 41.5 Wrist 5th Digit 38 39 1   Motor Left/Right Comparison   Stim Site L Lat (ms) R Lat (ms) L-R Lat (ms) L Amp (mV) R Amp (mV) L-R Amp (%) Site1 Site2 L Vel (m/s) R Vel (m/s) L-R Vel (m/s)  Median Motor (Abd Poll Brev)  31.8C  Wrist 3.2 3.4 0.2 *3.8 *4.7 19.1 Elbow Wrist 51 53 2  Elbow 7.3 7.4 0.1 3.7 4.4 15.9       Ulnar Motor (Abd Dig Min)  31.9C  Wrist 4.2 3.1 *1.1 5.9 4.7 20.3 B Elbow Wrist 56 53 3  B Elbow 7.6 6.9 0.7 5.2 4.4 15.4 A Elbow B Elbow 67 71 4  A Elbow 9.1 8.3 0.8 4.8 4.1 14.6          Waveforms:

## 2022-01-30 NOTE — Progress Notes (Signed)
Faatima Tench - 81 y.o. female MRN [id number]  Date of birth: [birth year]  Office Visit Note: Visit Date: 01/28/2022 PCP: Cassandria Anger, MD Referred by: Jessy Oto, MD  Subjective: Chief Complaint  Patient presents with   Right Hand - Other    Cramps   Left Hand - Other    Cramps   HPI:  Uzma Hellmer is a 81 y.o. female who comes in today at the request of Dr. Basil Dess for electrodiagnostic study of the Bilateral upper extremities.  Patient is Right hand dominant.  Her biggest complaints most recently is severe headache and occipital type headache but also with significant cramping in the hands bilaterally.  Either hand can be quite cramping at times.  She denies much in the way of paresthesia or frank numbness and tingling.  Occasional tingling at night.  Biggest complaint is just significant cramping with using the hand.  She reports some work-up by primary care physician and suggestion of using magnesium.  She reports intolerance to the magnesium she was taking.  I did suggest that she should try the magnesium glycinate and did write that down for her.   In terms of her occipital headache she was somewhat irritated as well as her husband who is a retired Doctor, general practice from [employer] that she was not able to get in for an injection with me.  I did try to explain to her that when the request came and it was more for evaluation and possible occipital injection and at that time there was a pending MRI of the cervical spine.  I wanted to see her after the MRI of the cervical spine before we decide what type of procedure to pursue.  Nonetheless she was ultimately sent to Dr. Suella Broad who she had seen in the past and he did provide occipital nerve block in the office and this has not helped her much at all.  It sounds like they are pending insurance approval for a third occipital nerve or C2-3 facet block.    ROS Otherwise per HPI.  Assessment &  Plan: Visit Diagnoses:    ICD-10-CM   1. Paresthesia of skin  R20.2 NCV with EMG (electromyography)      Plan:Impression: Essentially NORMAL electrodiagnostic study of both upper limbs.  There is no significant electrodiagnostic evidence of nerve entrapment, brachial plexopathy or cervical radiculopathy.  Reduced amplitudes noted in the findings likely [facility] technical artifact with significant osteoarthritis of the hands.  As you know, purely sensory or demyelinating radiculopathies and chemical radiculitis may not be detected with this particular electrodiagnostic study.  Recommendations: 1.  Follow-up with referring physician. 2.  Continue current management of symptoms.  Discussed less bowel issues with magnesium glycinate. 3.  Dr. Louanne Skye may wish to consult with Dr. Sarina Ill at [facility] for possible headache and occipital headache management.  Meds & Orders: No orders of the defined types were placed in this encounter.   Orders Placed This Encounter  Procedures   NCV with EMG (electromyography)    Follow-up: Return for Basil Dess, MD as scheduled.   Procedures: No procedures performed  EMG & NCV Findings: Evaluation of the left median motor, the right median motor, and the left ulnar sensory nerves showed reduced amplitude (L3.8, R4.7, L10.7 V).  The right median (across palm) sensory nerve showed prolonged distal peak latency (Palm, 2.1 ms).  All remaining nerves (as indicated in the following tables) were within normal limits.  Left  vs. Right side comparison data for the ulnar motor nerve indicates abnormal L-R latency difference (1.1 ms).  All remaining left vs. right side differences were within normal limits.    All examined muscles (as indicated in the following table) showed no evidence of electrical instability.    Impression: Essentially NORMAL electrodiagnostic study of both upper limbs.  There is no significant electrodiagnostic evidence of  nerve entrapment, brachial plexopathy or cervical radiculopathy.  Reduced amplitudes noted in the findings likely [facility] technical artifact with significant osteoarthritis of the hands.  As you know, purely sensory or demyelinating radiculopathies and chemical radiculitis may not be detected with this particular electrodiagnostic study.  Recommendations: 1.  Follow-up with referring physician. 2.  Continue current management of symptoms.  Discussed less bowel issues with magnesium glycinate. 3.  Dr. Louanne Skye may wish to consult with Dr. Sarina Ill at [facility] for possible headache and occipital headache management.  ___________________________ [place] Board Certified, American Board of Physical Medicine and Rehabilitation    Nerve Conduction Studies Anti Sensory Summary Table   Stim Site NR Peak (ms) Norm Peak (ms) P-T Amp (V) Norm P-T Amp Site1 Site2 Delta-P (ms) Dist (cm) Vel (m/s) Norm Vel (m/s)  Left Median Acr Palm Anti Sensory (2nd Digit)  31.9C  Wrist    3.5 <3.6 35.9 >10        Right Median Acr Palm Anti Sensory (2nd Digit)  31C  Wrist    3.6 <3.6 33.2 >10 Wrist Palm 1.5 0.0    Palm    *2.1 <2.0 29.6         Left Radial Anti Sensory (Base 1st Digit)  31.4C  Wrist    2.5 <3.1 23.4  Wrist Base 1st Digit 2.5 0.0    Right Radial Anti Sensory (Base 1st Digit)  31.2C  Wrist    2.7 <3.1 21.2  Wrist Base 1st Digit 2.7 0.0    Left Ulnar Anti Sensory (5th Digit)  31.8C  Wrist    3.7 <3.7 *10.7 >15.0 Wrist 5th Digit 3.7 14.0 38 >38  Right Ulnar Anti Sensory (5th Digit)  31.4C  Wrist    3.6 <3.7 18.3 >15.0 Wrist 5th Digit 3.6 14.0 39 >38   Motor Summary Table   Stim Site NR Onset (ms) Norm Onset (ms) O-P Amp (mV) Norm O-P Amp Site1 Site2 Delta-0 (ms) Dist (cm) Vel (m/s) Norm Vel (m/s)  Left Median Motor (Abd Poll Brev)  31.8C  Wrist    3.2 <4.2 *3.8 >5 Elbow Wrist 4.1 21.0 51 >50  Elbow    7.3  3.7         Right Median Motor (Abd Poll Brev)   31.5C  Wrist    3.4 <4.2 *4.7 >5 Elbow Wrist 4.0 21.0 53 >50  Elbow    7.4  4.4         Left Ulnar Motor (Abd Dig Min)  31.9C  Wrist    4.2 <4.2 5.9 >3 B Elbow Wrist 3.4 19.0 56 >53  B Elbow    7.6  5.2  A Elbow B Elbow 1.5 10.0 67 >53  A Elbow    9.1  4.8         Right Ulnar Motor (Abd Dig Min)  31.4C  Wrist    3.1 <4.2 4.7 >3 B Elbow Wrist 3.8 20.0 53 >53  B Elbow    6.9  4.4  A Elbow B Elbow 1.4 10.0 71 >53  A Elbow  8.3  4.1          EMG   Side Muscle Nerve Root Ins Act Fibs Psw Amp Dur Poly Recrt Int [name] Comment  Left 1stDorInt Ulnar C8-T1 Nml Nml Nml Nml Nml 0 Nml Nml   Left Abd Poll Brev Median C8-T1 Nml Nml Nml Nml Nml 0 Nml Nml   Left ExtDigCom   Nml Nml Nml Nml Nml 0 Nml Nml   Left Triceps Radial C6-7-8 Nml Nml Nml Nml Nml 0 Nml Nml   Left Deltoid Axillary C5-6 Nml Nml Nml Nml Nml 0 Nml Nml     Nerve Conduction Studies Anti Sensory Left/Right Comparison   Stim Site L Lat (ms) R Lat (ms) L-R Lat (ms) L Amp (V) R Amp (V) L-R Amp (%) Site1 Site2 L Vel (m/s) R Vel (m/s) L-R Vel (m/s)  Median Acr Palm Anti Sensory (2nd Digit)  31.9C  Wrist 3.5 3.6 0.1 35.9 33.2 7.5       Radial Anti Sensory (Base 1st Digit)  31.4C  Wrist 2.5 2.7 0.2 23.4 21.2 9.4 Wrist Base 1st Digit     Ulnar Anti Sensory (5th Digit)  31.8C  Wrist 3.7 3.6 0.1 *10.7 18.3 41.5 Wrist 5th Digit 38 39 1   Motor Left/Right Comparison   Stim Site L Lat (ms) R Lat (ms) L-R Lat (ms) L Amp (mV) R Amp (mV) L-R Amp (%) Site1 Site2 L Vel (m/s) R Vel (m/s) L-R Vel (m/s)  Median Motor (Abd Poll Brev)  31.8C  Wrist 3.2 3.4 0.2 *3.8 *4.7 19.1 Elbow Wrist 51 53 2  Elbow 7.3 7.4 0.1 3.7 4.4 15.9       Ulnar Motor (Abd Dig Min)  31.9C  Wrist 4.2 3.1 *1.1 5.9 4.7 20.3 B Elbow Wrist 56 53 3  B Elbow 7.6 6.9 0.7 5.2 4.4 15.4 A Elbow B Elbow 67 71 4  A Elbow 9.1 8.3 0.8 4.8 4.1 14.6          Waveforms:                      Clinical History: MRI CERVICAL SPINE WITHOUT CONTRAST    TECHNIQUE: Multiplanar, multisequence MR imaging of the cervical spine was performed. No intravenous contrast was administered.   COMPARISON:  Cervical spine radiographs 12/31/2021   FINDINGS: Alignment: Mild anterolisthesis C2-3 and C3-4. Retrolisthesis C5-6 and C6-7. Mild anterolisthesis C7-T1 and T1-2.   Vertebrae: Negative for fracture or mass   Cord: Normal signal and morphology   Posterior Fossa, vertebral arteries, paraspinal tissues: Thyroid enlargement. This is only partially evaluated due to the anterior saturation pulse. There appear to be bilateral nodules measuring approximately 30 mm on the left and 21 mm on the right.   Disc levels:   C2-3: Disc degeneration with small central disc protrusion. Mild left foraminal narrowing due to facet hypertrophy   C3-4: Disc and facet degeneration. Mild spinal stenosis and mild foraminal stenosis bilaterally.   C4-5: Solid ankylosis. Question prior surgery versus degenerative ankylosis. No significant spinal or foraminal stenosis   C5-6: Disc degeneration with diffuse uncinate spurring. Cord flattening with mild spinal stenosis. Moderate foraminal stenosis bilaterally   C6-7: Disc degeneration with diffuse uncinate spurring. Mild spinal stenosis. Moderate foraminal narrowing bilaterally left greater than right   C7-T1: Disc and facet degeneration. Moderate foraminal narrowing bilaterally due to spurring.   IMPRESSION: Multilevel cervical spondylosis causing spinal and foraminal stenosis as described above.   Ankylosis at C4-5.  Correlate with  surgical history.   Bilateral thyroid nodules measuring up to 30 mm on the left. Thyroid ultrasound recommended. (Ref: J Am Coll Radiol. 2015 Feb;12(2): 143-50).     Electronically Signed   By: Franchot Gallo M.D.   On: 01/14/2022 10:08     Objective:  VS:  HT:    WT:   BMI:     BP:   HR: bpm  TEMP: ( )  RESP:  Physical Exam Vitals and nursing note reviewed.   Constitutional:      General: She is not in acute distress.    Appearance: Normal appearance. She is not ill-appearing.  HENT:     Head: Normocephalic and atraumatic.     Right Ear: External ear normal.     Left Ear: External ear normal.  Eyes:     Extraocular Movements: Extraocular movements intact.  Neck:     Comments: Decreased range of motion at end range of rotation.  Negative Spurling's test bilaterally. Cardiovascular:     Rate and Rhythm: Normal rate.     Pulses: Normal pulses.  Musculoskeletal:     Cervical back: Tenderness present. No rigidity.     Right lower leg: No edema.     Left lower leg: No edema.     Comments: Patient has good strength in the upper extremities including 5 out of 5 strength in wrist extension long finger flexion and APB.  There is no atrophy of the hands intrinsically.  There is a negative Hoffmann's test.  She has negative Phalen's bilaterally.   Lymphadenopathy:     Cervical: No cervical adenopathy.  Skin:    Findings: No erythema, lesion or rash.  Neurological:     General: No focal deficit present.     Mental Status: She is alert and oriented to person, place, and time.     Sensory: No sensory deficit.     Motor: No weakness or abnormal muscle tone.     Coordination: Coordination normal.  Psychiatric:        Mood and Affect: Mood normal.        Behavior: Behavior normal.      Imaging: No results found.

## 2022-02-12 ENCOUNTER — Other Ambulatory Visit: Payer: Self-pay | Admitting: Internal Medicine

## 2022-02-17 ENCOUNTER — Telehealth (HOSPITAL_COMMUNITY): Payer: Self-pay

## 2022-02-17 DIAGNOSIS — H2513 Age-related nuclear cataract, bilateral: Secondary | ICD-10-CM | POA: Diagnosis not present

## 2022-02-17 DIAGNOSIS — H18593 Other hereditary corneal dystrophies, bilateral: Secondary | ICD-10-CM | POA: Diagnosis not present

## 2022-02-17 DIAGNOSIS — H401131 Primary open-angle glaucoma, bilateral, mild stage: Secondary | ICD-10-CM | POA: Diagnosis not present

## 2022-02-17 NOTE — Telephone Encounter (Signed)
Returned call to Mr. Haltiwanger. He was wondering why no one had called his wife to get her injection scheduled. I informed him that Dr. Otho Ket office was called on 8/17 and a vm was left that we do not do cervical spine. Informed pt to give a call to Dr. Otho Ket office to discuss. AW

## 2022-02-19 ENCOUNTER — Other Ambulatory Visit: Payer: Self-pay | Admitting: Specialist

## 2022-02-19 DIAGNOSIS — M47812 Spondylosis without myelopathy or radiculopathy, cervical region: Secondary | ICD-10-CM

## 2022-02-23 ENCOUNTER — Other Ambulatory Visit: Payer: Self-pay | Admitting: Specialist

## 2022-02-23 ENCOUNTER — Ambulatory Visit: Payer: Medicare HMO | Admitting: Specialist

## 2022-02-23 ENCOUNTER — Encounter: Payer: Self-pay | Admitting: Specialist

## 2022-02-23 VITALS — BP 125/73 | HR 72 | Ht 60.0 in | Wt 189.0 lb

## 2022-02-23 DIAGNOSIS — M47812 Spondylosis without myelopathy or radiculopathy, cervical region: Secondary | ICD-10-CM

## 2022-02-23 DIAGNOSIS — R202 Paresthesia of skin: Secondary | ICD-10-CM | POA: Diagnosis not present

## 2022-02-23 DIAGNOSIS — R200 Anesthesia of skin: Secondary | ICD-10-CM

## 2022-02-23 DIAGNOSIS — M4722 Other spondylosis with radiculopathy, cervical region: Secondary | ICD-10-CM | POA: Diagnosis not present

## 2022-02-23 DIAGNOSIS — M5481 Occipital neuralgia: Secondary | ICD-10-CM

## 2022-02-23 DIAGNOSIS — M542 Cervicalgia: Secondary | ICD-10-CM

## 2022-02-23 MED ORDER — PREGABALIN 50 MG PO CAPS: 50.0000 mg | ORAL_CAPSULE | Freq: Three times a day (TID) | ORAL | 0 refills | Status: DC

## 2022-02-23 MED ORDER — FUROSEMIDE 20 MG PO TABS: 20.0000 mg | ORAL_TABLET | Freq: Every day | ORAL | 0 refills | Status: DC

## 2022-02-23 MED ORDER — METHYLPREDNISOLONE 4 MG PO TBPK: ORAL_TABLET | ORAL | 0 refills | Status: DC

## 2022-02-23 NOTE — Patient Instructions (Addendum)
  Plan: Avoid overhead lifting and overhead use of the arms. Do not lift greater than 5 lbs. Adjust head rest in vehicle to prevent hyperextension if rear ended. Take extra precautions to avoid falling, including use of a cane if you feel weak. Referral to [facility] for right C1-2 and C2-3 facet blocks. Referral to neurology for consideration of right sided botox injections if facet blocks are not able to maintain relief. Medrol dose pak Lyrica 50 mg po up to TID.

## 2022-02-23 NOTE — Progress Notes (Unsigned)
Office Visit Note   Patient: Mariah Farrell           Date of Birth: 1941/01/13           MRN: [id number] Visit Date: 02/23/2022              Requested by: Cassandria Anger, MD [place],  [place] [id number] PCP: Plotnikov, Evie Lacks, MD   Assessment & Plan: Visit Diagnoses:  1. Spondylosis without myelopathy or radiculopathy, cervical region   2. Paresthesia of skin   3. Cervico-occipital neuralgia   4. Cervicalgia   5. Other spondylosis with radiculopathy, cervical region   6. Numbness and tingling of right arm     Plan: Avoid overhead lifting and overhead use of the arms. Do not lift greater than 5 lbs. Adjust head rest in vehicle to prevent hyperextension if rear ended. Take extra precautions to avoid falling, including use of a cane if you feel weak. Referral to [facility] for right C1-2 and C2-3 facet blocks. Referral to neurology for consideration of right sided botox injections if facet blocks are not able to maintain relief.    Follow-Up Instructions: No follow-ups on file.   Orders:  Orders Placed This Encounter  Procedures  . Ambulatory referral to Neurology   No orders of the defined types were placed in this encounter.     Procedures: No procedures performed   Clinical Data: No additional findings.   Subjective: Chief Complaint  Patient presents with  . Neck - Pain, Follow-up    81 year old right handed female with diffuse cervical spondylosis she is experiencing right occipital neuralgia, underwent ganglion block with Dr. Nelva Bush with only mild relief. Pain with radiation in to the right arm. Motor is normal. MRI with fluid right C1-2 facet.   Review of Systems  Constitutional: Negative.   HENT: Negative.    Eyes: Negative.   Respiratory: Negative.    Cardiovascular: Negative.   Gastrointestinal: Negative.   Endocrine: Negative.   Genitourinary: Negative.   Musculoskeletal: Negative.   Skin: Negative.    Allergic/Immunologic: Negative.   Neurological: Negative.   Hematological: Negative.   Psychiatric/Behavioral: Negative.      Objective: Vital Signs: BP 125/73   Pulse 72   Ht 5' (1.524 m)   Wt 189 lb (85.7 kg)   BMI 36.91 kg/m   Physical Exam Constitutional:      Appearance: She is well-developed.  HENT:     Head: Normocephalic and atraumatic.  Eyes:     Pupils: Pupils are equal, round, and reactive to light.  Pulmonary:     Effort: Pulmonary effort is normal.     Breath sounds: Normal breath sounds.  Abdominal:     General: Bowel sounds are normal.     Palpations: Abdomen is soft.  Musculoskeletal:     Cervical back: Normal range of motion and neck supple.     Lumbar back: Negative right straight leg raise test and negative left straight leg raise test.  Skin:    General: Skin is warm and dry.  Neurological:     Mental Status: She is alert and oriented to person, place, and time.  Psychiatric:        Behavior: Behavior normal.        Thought Content: Thought content normal.        Judgment: Judgment normal.   Back Exam   Tenderness  The patient is experiencing tenderness in the cervical.  Range of  Motion  Flexion:  abnormal  Lateral bend right:  abnormal  Lateral bend left:  abnormal  Rotation right:  abnormal  Rotation left:  abnormal   Muscle Strength  Right Quadriceps:  5/5  Left Quadriceps:  5/5  Right Hamstrings:  5/5  Left Hamstrings:  5/5   Tests  Straight leg raise right: negative Straight leg raise left: negative  Other  Toe walk: normal Heel walk: normal Sensation: normal Gait: normal  Erythema: no back redness Scars: absent  Comments:  Right Bicep, tricep and BR reflex are normal.    Specialty Comments:  MRI CERVICAL SPINE WITHOUT CONTRAST   TECHNIQUE: Multiplanar, multisequence MR imaging of the cervical spine was performed. No intravenous contrast was administered.   COMPARISON:  Cervical spine radiographs 12/31/2021    FINDINGS: Alignment: Mild anterolisthesis C2-3 and C3-4. Retrolisthesis C5-6 and C6-7. Mild anterolisthesis C7-T1 and T1-2.   Vertebrae: Negative for fracture or mass   Cord: Normal signal and morphology   Posterior Fossa, vertebral arteries, paraspinal tissues: Thyroid enlargement. This is only partially evaluated due to the anterior saturation pulse. There appear to be bilateral nodules measuring approximately 30 mm on the left and 21 mm on the right.   Disc levels:   C2-3: Disc degeneration with small central disc protrusion. Mild left foraminal narrowing due to facet hypertrophy   C3-4: Disc and facet degeneration. Mild spinal stenosis and mild foraminal stenosis bilaterally.   C4-5: Solid ankylosis. Question prior surgery versus degenerative ankylosis. No significant spinal or foraminal stenosis   C5-6: Disc degeneration with diffuse uncinate spurring. Cord flattening with mild spinal stenosis. Moderate foraminal stenosis bilaterally   C6-7: Disc degeneration with diffuse uncinate spurring. Mild spinal stenosis. Moderate foraminal narrowing bilaterally left greater than right   C7-T1: Disc and facet degeneration. Moderate foraminal narrowing bilaterally due to spurring.   IMPRESSION: Multilevel cervical spondylosis causing spinal and foraminal stenosis as described above.   Ankylosis at C4-5.  Correlate with surgical history.   Bilateral thyroid nodules measuring up to 30 mm on the left. Thyroid ultrasound recommended. (Ref: J Am Coll Radiol. 2015 Feb;12(2): 143-50).     Electronically Signed   By: Franchot Gallo M.D.   On: 01/14/2022 10:08  Imaging: No results found.   PMFS History: Patient Active Problem List   Diagnosis Date Noted  . Furuncle of vulva 09/22/2021  . History of COVID-19 09/22/2021  . Post-COVID chronic cough 09/22/2021  . Cramp in limb 06/23/2021  . Arthritis of hand 05/15/2021  . Hemoptysis 04/10/2021  . Bleeding from mouth  04/10/2021  . DOE (dyspnea on exertion) 06/20/2020  . Ingrown toenail of right foot 06/20/2020  . Pain in left foot 06/18/2020  . Cervical radiculopathy 05/06/2020  . DDD (degenerative disc disease), cervical 05/06/2020  . Situational depression 01/04/2020  . Renal insufficiency 05/10/2019  . Somatic dysfunction of right sacroiliac joint 02/27/2019  . Lumbar spondylosis 08/04/2018  . Vitamin D deficiency 07/17/2018  . Essential hypertension 06/18/2018  . Gastroesophageal reflux disease without esophagitis 06/18/2018  . Hypothyroidism (acquired) 06/18/2018  . Mixed hyperlipidemia 06/18/2018  . Severe protein-calorie malnutrition ([place]) 06/18/2018  . Dementia with behavioral disturbance ([place]) 06/17/2018  . Schizophrenia ([place]) 06/17/2018  . History of lumbar fusion 06/14/2018  . Bipolar disorder, unspecified ([place]) 04/08/2018  . Breast mass, right 08/10/2017  . Lumbar post-laminectomy syndrome 06/15/2017  . 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 Stress at home 03/24/2017  . Pre-operative examination for internal medicine 10/06/2016  . RLQ abdominal pain  05/16/2016  . Need for prophylactic vaccination and inoculation against influenza 03/04/2016  . Allergic rhinitis 02/20/2014  . Osteoporosis 12/18/2013  . Expected blood loss anemia 07/20/2012  . Obesity, unspecified 07/20/2012  . S/P left TKA 07/19/2012  . Tinnitus 09/28/2011  . Well adult exam 09/28/2011  . Knee pain 09/28/2011  . Goiter 04/18/2009  . TOBACCO USE, QUIT 04/18/2009  . Chest pain, atypical 04/17/2009  . Osteoarthritis 04/11/2007  . Lumbar back pain 04/11/2007  . COLONIC POLYPS, HX OF 04/11/2007   Past Medical History:  Diagnosis Date  . Arthritis   . Cancer ([place]) 13 years ago   bladder  . GERD (gastroesophageal reflux disease)   . Goiter    synthroid used to shrink  . H/O hiatal hernia   . Headache(784.0)    migraines in morning  . PONV (postoperative nausea and vomiting)   .  Seasonal allergies     Family History  Problem Relation Age of Onset  . Cancer Brother 54       pancreatic  . Brain cancer Father     Past Surgical History:  Procedure Laterality Date  . ARTHRODESIS FOOT WITH WEIL OSTEOTOMY Right 07/26/2014   Procedure: RIGHT HALLUX METATARSOPHALANGEAL JOINT ARTHRODESIS; RIGHT SECOND METATARSAL WEIL OSTEOTOMY; DORSAL CAPSULOTOMY EXTENSOR TENDON LENGTHENING;  Surgeon: Wylene Simmer, MD;  Location: [facility];  Service: Orthopedics;  Laterality: Right;  . BLADDER SURGERY  13 years ago  . BUNIONECTOMY Bilateral   . COLONOSCOPY    . LUMBAR FUSION  May 2014   L4-L5  . ROTATOR CUFF REPAIR Bilateral ~2010, 2012  . SPINAL FUSION     L1, L2  . TONSILLECTOMY  as child  . TOTAL KNEE ARTHROPLASTY Right 07/19/2012   Procedure: TOTAL KNEE ARTHROPLASTY;  Surgeon: Mauri Pole, MD;  Location: [facility];  Service: Orthopedics;  Laterality: Right;  . TOTAL KNEE ARTHROPLASTY Left 08/01/2013   Procedure: LEFT TOTAL KNEE ARTHROPLASTY;  Surgeon: Mauri Pole, MD;  Location: [facility];  Service: Orthopedics;  Laterality: Left;   Social History   Occupational History  . Not on file  Tobacco Use  . Smoking status: Former    Packs/day: 0.25    Years: 5.00    Total pack years: 1.25    Types: Cigarettes    Quit date: 06/01/1964    Years since quitting: 57.7  . Smokeless tobacco: Never  Vaping Use  . Vaping Use: Never used  Substance and Sexual Activity  . Alcohol use: Yes    Comment: 1-2 times weekly  . Drug use: No  . Sexual activity: Not on file

## 2022-03-13 ENCOUNTER — Ambulatory Visit: Payer: Medicare HMO | Admitting: Specialist

## 2022-03-16 ENCOUNTER — Ambulatory Visit
Admission: RE | Admit: 2022-03-16 | Discharge: 2022-03-16 | Disposition: A | Discharge status: Home / Self Care | Payer: Medicare HMO | Source: Ambulatory Visit | Attending: Specialist | Admitting: Specialist

## 2022-03-16 DIAGNOSIS — M47812 Spondylosis without myelopathy or radiculopathy, cervical region: Secondary | ICD-10-CM | POA: Diagnosis not present

## 2022-03-16 MED ORDER — DEXAMETHASONE SODIUM PHOSPHATE 4 MG/ML IJ SOLN
6.0000 mg | Freq: Once | INTRAMUSCULAR | Status: AC
  Administered 2022-03-16: 6 mg via INTRA_ARTICULAR

## 2022-03-16 MED ORDER — IOPAMIDOL (ISOVUE-M 300) INJECTION 61%
1.0000 mL | Freq: Once | INTRAMUSCULAR | Status: AC | PRN
  Administered 2022-03-16: 1 mL via INTRA_ARTICULAR

## 2022-03-16 NOTE — Discharge Instructions (Signed)

## 2022-03-21 ENCOUNTER — Other Ambulatory Visit: Payer: Self-pay | Admitting: Internal Medicine

## 2022-03-22 ENCOUNTER — Other Ambulatory Visit: Payer: Self-pay | Admitting: Specialist

## 2022-03-23 ENCOUNTER — Encounter: Payer: Self-pay | Admitting: Specialist

## 2022-03-23 ENCOUNTER — Ambulatory Visit: Payer: Medicare HMO | Admitting: Specialist

## 2022-03-23 VITALS — BP 111/66 | HR 75 | Ht 60.0 in | Wt 189.0 lb

## 2022-03-23 DIAGNOSIS — M4802 Spinal stenosis, cervical region: Secondary | ICD-10-CM | POA: Diagnosis not present

## 2022-03-23 DIAGNOSIS — M47812 Spondylosis without myelopathy or radiculopathy, cervical region: Secondary | ICD-10-CM

## 2022-03-23 DIAGNOSIS — M4722 Other spondylosis with radiculopathy, cervical region: Secondary | ICD-10-CM

## 2022-03-23 DIAGNOSIS — R202 Paresthesia of skin: Secondary | ICD-10-CM | POA: Diagnosis not present

## 2022-03-23 DIAGNOSIS — M5481 Occipital neuralgia: Secondary | ICD-10-CM

## 2022-03-23 DIAGNOSIS — R200 Anesthesia of skin: Secondary | ICD-10-CM

## 2022-03-23 NOTE — Patient Instructions (Signed)
Plan: Avoid overhead lifting and overhead use of the arms. Do not lift greater than 5 lbs. Adjust head rest in vehicle to prevent hyperextension if rear ended. Take extra precautions to avoid falling, including use of a cane if you feel weak. Keep appointment with Dr. Krista Blue for right occipital neuralgia and C1-2 spondylosis. Ordered for Right C1-2 facet injection by intervention radiology at [facility]. Will schedule you an appoint with Dr. Jenne Campus for assessment of cervical spondylosis.  Fall Prevention and Home Safety Falls cause injuries and can affect all age groups. It is possible to use preventive measures to significantly decrease the likelihood of falls. There are many simple measures which can make your home safer and prevent falls. OUTDOORS Repair cracks and edges of walkways and driveways. Remove high doorway thresholds. Trim shrubbery on the main path into your home. Have good outside lighting. Clear walkways of tools, rocks, debris, and clutter. Check that handrails are not broken and are securely fastened. Both sides of steps should have handrails. Have leaves, snow, and ice cleared regularly. Use sand or salt on walkways during winter months. In the garage, clean up grease or oil spills. BATHROOM Install night lights. Install grab bars by the toilet and in the tub and shower. Use non-skid mats or decals in the tub or shower. Place a plastic non-slip stool in the shower to sit on, if needed. Keep floors dry and clean up all water on the floor immediately. Remove soap buildup in the tub or shower on a regular basis. Secure bath mats with non-slip, double-sided rug tape. Remove throw rugs and tripping hazards from the floors. BEDROOMS Install night lights. Make sure a bedside light is easy to reach. Do not use oversized bedding. Keep a telephone by your bedside. Have a firm chair with side arms to use for getting dressed. Remove throw rugs and tripping  hazards from the floor. KITCHEN Keep handles on pots and pans turned toward the center of the stove. Use back burners when possible. Clean up spills quickly and allow time for drying. Avoid walking on wet floors. Avoid hot utensils and knives. Position shelves so they are not too high or low. Place commonly used objects within easy reach. If necessary, use a sturdy step stool with a grab bar when reaching. Keep electrical cables out of the way. Do not use floor polish or wax that makes floors slippery. If you must use wax, use non-skid floor wax. Remove throw rugs and tripping hazards from the floor. STAIRWAYS Never leave objects on stairs. Place handrails on both sides of stairways and use them. Fix any loose handrails. Make sure handrails on both sides of the stairways are as long as the stairs. Check carpeting to make sure it is firmly attached along stairs. Make repairs to worn or loose carpet promptly. Avoid placing throw rugs at the top or bottom of stairways, or properly secure the rug with carpet tape to prevent slippage. Get rid of throw rugs, if possible. Have an electrician put in a light switch at the top and bottom of the stairs. OTHER FALL PREVENTION TIPS Wear low-heel or rubber-soled shoes that are supportive and fit well. Wear closed toe shoes. When using a stepladder, make sure it is fully opened and both spreaders are firmly locked. Do not climb a closed stepladder. Add color or contrast paint or tape to grab bars and handrails in your home. Place contrasting color strips on first and last steps. Learn and use mobility aids  as needed. Install an electrical emergency response system. Turn on lights to avoid dark areas. Replace light bulbs that burn out immediately. Get light switches that glow. Arrange furniture to create clear pathways. Keep furniture in the same place. Firmly attach carpet with non-skid or double-sided tape. Eliminate uneven floor surfaces. Select a  carpet pattern that does not visually hide the edge of steps. Be aware of all pets. OTHER HOME SAFETY TIPS Set the water temperature for 120 F (48.8 C). Keep emergency numbers on or near the telephone. Keep smoke detectors on every level of the home and near sleeping areas. Document Released: 05/08/2002 Document Revised: 11/17/2011 Document Reviewed: 08/07/2011 [facility] Patient Information 2014 [place].

## 2022-03-23 NOTE — Progress Notes (Signed)
Office Visit Note   Patient: Mariah Farrell           Date of Birth: 09-25-1940           MRN: [id number] Visit Date: 03/23/2022              Requested by: Cassandria Anger, MD [place],  [place] [address] PCP: Plotnikov, Evie Lacks, MD   Assessment & Plan: Visit Diagnoses:  1. Spondylosis without myelopathy or radiculopathy, cervical region   2. Cervico-occipital neuralgia   3. Spinal stenosis of cervical region   4. Numbness and tingling of right arm   5. 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 I am concerned that a surgical solution may be the only way to relieve both mechanical pain and nerve irritation but her condition is diffuse. I discusses obtaining a referral with Dr. Jenne Campus and they are very interest in having his input. Continue with referral to Dr. Krista Blue for consideration of Botox injections for  Cervicooccipital neuralgia and opinion concerning Tic [place] symptoms. Will request consult with [facility] for considering a right C1-2  Facet block. Upper atlantoaxial osteoarthritis.   Plan: Avoid overhead lifting and overhead use of the arms. Do not lift greater than 5 lbs. Adjust head rest in vehicle to prevent hyperextension if rear ended. Take extra precautions to avoid falling, including use of a cane if you feel weak. Fall Prevention and Home Safety Falls cause injuries and can affect all age groups. It is possible to use preventive measures to significantly decrease the likelihood of falls. There are many simple measures which can make your home safer and prevent falls. OUTDOORS Repair cracks and edges of walkways and driveways. Remove high doorway thresholds. Trim shrubbery on the main path into your home. Have good outside lighting. Clear walkways of tools, rocks, debris, and clutter. Check that handrails are not  broken and are securely fastened. Both sides of steps should have handrails. Have leaves, snow, and ice cleared regularly. Use sand or salt on walkways during winter months. In the garage, clean up grease or oil spills. BATHROOM Install night lights. Install grab bars by the toilet and in the tub and shower. Use non-skid mats or decals in the tub or shower. Place a plastic non-slip stool in the shower to sit on, if needed. Keep floors dry and clean up all water on the floor immediately. Remove soap buildup in the tub or shower on a regular basis. Secure bath mats with non-slip, double-sided rug tape. Remove throw rugs and tripping hazards from the floors. BEDROOMS Install night lights. Make sure a bedside light is easy to reach. Do not use oversized bedding. Keep a telephone by your bedside. Have a firm chair with side arms to use for getting dressed. Remove throw rugs and tripping hazards from the floor. KITCHEN Keep handles on pots and pans turned toward the center of the stove. Use back burners when possible. Clean up spills quickly and allow time for drying. Avoid walking on wet floors. Avoid hot utensils and knives. Position shelves so they are not too high or low. Place commonly used objects within easy reach. If necessary, use a sturdy step stool with a grab bar when reaching. Keep electrical cables out of the way. Do not use floor polish or wax that makes floors slippery. If you must use wax, use non-skid floor wax.  Remove throw rugs and tripping hazards from the floor. STAIRWAYS Never leave objects on stairs. Place handrails on both sides of stairways and use them. Fix any loose handrails. Make sure handrails on both sides of the stairways are as long as the stairs. Check carpeting to make sure it is firmly attached along stairs. Make repairs to worn or loose carpet promptly. Avoid placing throw rugs at the top or bottom of stairways, or properly secure the rug with carpet  tape to prevent slippage. Get rid of throw rugs, if possible. Have an electrician put in a light switch at the top and bottom of the stairs. OTHER FALL PREVENTION TIPS Wear low-heel or rubber-soled shoes that are supportive and fit well. Wear closed toe shoes. When using a stepladder, make sure it is fully opened and both spreaders are firmly locked. Do not climb a closed stepladder. Add color or contrast paint or tape to grab bars and handrails in your home. Place contrasting color strips on first and last steps. Learn and use mobility aids as needed. Install an electrical emergency response system. Turn on lights to avoid dark areas. Replace light bulbs that burn out immediately. Get light switches that glow. Arrange furniture to create clear pathways. Keep furniture in the same place. Firmly attach carpet with non-skid or double-sided tape. Eliminate uneven floor surfaces. Select a carpet pattern that does not visually hide the edge of steps. Be aware of all pets. OTHER HOME SAFETY TIPS Set the water temperature for 120 F (48.8 C). Keep emergency numbers on or near the telephone. Keep smoke detectors on every level of the home and near sleeping areas. Document Released: 05/08/2002 Document Revised: 11/17/2011 Document Reviewed: 08/07/2011 [facility] Patient Information 2014 [place].     Follow-Up Instructions: No follow-ups on file.   Orders:  No orders of the defined types were placed in this encounter.  No orders of the defined types were placed in this encounter.     Procedures: No procedures performed   Clinical Data: No additional findings.   Subjective: Chief Complaint  Patient presents with   Neck - Follow-up    81 year old female with prolong history of neck pain and intermittant upper extremity numbness. No bowel or bladder difficulty. No ataxia or falling. Upper extremity with shoulder weakness. She had right C2-3 facet block by [facility]. Reports no  relief with the block. She has multiple level spondylosis with pain that is right occipital and radiates even into the right upper and mid face like a Tic Deliroux. Pain with catching sensation with turning her head side to side and with extension. She had right C3 SNRB without help by Dr. Caryn Bee. MRI with bilateral foramenal narrowing multiple levels. At age 67 she does not want a surgical solution. She is to see Dr. Krista Blue neurology for assessment for possible Botoxin injections and unfortunately unable to assess the C1-2 facet that appears to have edema changes on her recent MRI as well as at the right C2-3 level. Radiologist at [facility] are not willing to attempt a facet block at right C1-2 and it is quite likely that this may only be possible at tertiary care facility like [facility].    Review of Systems  Constitutional: Negative.   HENT: Negative.    Eyes: Negative.   Respiratory: Negative.    Cardiovascular: Negative.   Gastrointestinal: Negative.   Endocrine: Negative.   Genitourinary: Negative.   Musculoskeletal: Negative.   Skin: Negative.  Allergic/Immunologic: Negative.   Neurological: Negative.   Hematological: Negative.   Psychiatric/Behavioral: Negative.       Objective: Vital Signs: BP 111/66 (BP Location: Left Arm, Patient Position: Sitting)   Pulse 75   Ht 5' (1.524 m)   Wt 189 lb (85.7 kg)   BMI 36.91 kg/m   Physical Exam Constitutional:      Appearance: She is well-developed.  HENT:     Head: Normocephalic and atraumatic.  Eyes:     Pupils: Pupils are equal, round, and reactive to light.  Pulmonary:     Effort: Pulmonary effort is normal.     Breath sounds: Normal breath sounds.  Abdominal:     General: Bowel sounds are normal.     Palpations: Abdomen is soft.  Musculoskeletal:     Cervical back: Normal range of motion and neck supple.     Lumbar back: Negative right straight leg raise test and negative left straight leg raise  test.  Skin:    General: Skin is warm and dry.  Neurological:     Mental Status: She is alert and oriented to person, place, and time.  Psychiatric:        Behavior: Behavior normal.        Thought Content: Thought content normal.        Judgment: Judgment normal.   Back Exam   Tenderness  The patient is experiencing tenderness in the cervical.  Range of Motion  Extension:  abnormal  Flexion:  abnormal  Lateral bend right:  abnormal  Lateral bend left:  abnormal  Rotation right:  abnormal  Rotation left:  abnormal   Muscle Strength  Right Quadriceps:  5/5  Left Quadriceps:  5/5  Right Hamstrings:  5/5  Left Hamstrings:  5/5   Tests  Straight leg raise right: negative Straight leg raise left: negative  Other  Toe walk: normal Heel walk: normal  Comments:  Painful ROM lateral bend and rotation right C2 lateral mass is painful.     Specialty Comments:  MRI CERVICAL SPINE WITHOUT CONTRAST   TECHNIQUE: Multiplanar, multisequence MR imaging of the cervical spine was performed. No intravenous contrast was administered.   COMPARISON:  Cervical spine radiographs 12/31/2021   FINDINGS: Alignment: Mild anterolisthesis C2-3 and C3-4. Retrolisthesis C5-6 and C6-7. Mild anterolisthesis C7-T1 and T1-2.   Vertebrae: Negative for fracture or mass   Cord: Normal signal and morphology   Posterior Fossa, vertebral arteries, paraspinal tissues: Thyroid enlargement. This is only partially evaluated due to the anterior saturation pulse. There appear to be bilateral nodules measuring approximately 30 mm on the left and 21 mm on the right.   Disc levels:   C2-3: Disc degeneration with small central disc protrusion. Mild left foraminal narrowing due to facet hypertrophy   C3-4: Disc and facet degeneration. Mild spinal stenosis and mild foraminal stenosis bilaterally.   C4-5: Solid ankylosis. Question prior surgery versus degenerative ankylosis. No significant spinal or  foraminal stenosis   C5-6: Disc degeneration with diffuse uncinate spurring. Cord flattening with mild spinal stenosis. Moderate foraminal stenosis bilaterally   C6-7: Disc degeneration with diffuse uncinate spurring. Mild spinal stenosis. Moderate foraminal narrowing bilaterally left greater than right   C7-T1: Disc and facet degeneration. Moderate foraminal narrowing bilaterally due to spurring.   IMPRESSION: Multilevel cervical spondylosis causing spinal and foraminal stenosis as described above.   Ankylosis at C4-5.  Correlate with surgical history.   Bilateral thyroid nodules measuring up to 30 mm on the left. Thyroid ultrasound  recommended. (Ref: J Am Coll Radiol. 2015 Feb;12(2): 143-50).     Electronically Signed   By: Franchot Gallo M.D.   On: 01/14/2022 10:08  Imaging: No results found.   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   Cramp in limb 06/23/2021   Arthritis of hand 05/15/2021   Hemoptysis 04/10/2021   Bleeding from mouth 04/10/2021   DOE (dyspnea on exertion) 06/20/2020   Ingrown toenail of right foot 06/20/2020   Pain in left foot 06/18/2020   Cervical radiculopathy 05/06/2020   DDD (degenerative disc disease), cervical 05/06/2020   Situational depression 01/04/2020   Renal insufficiency 05/10/2019   Somatic dysfunction of right sacroiliac joint 02/27/2019   Lumbar spondylosis 08/04/2018   Vitamin D deficiency 07/17/2018   Essential hypertension 06/18/2018   Gastroesophageal reflux disease without esophagitis 06/18/2018   Hypothyroidism (acquired) 06/18/2018   Mixed hyperlipidemia 06/18/2018   Severe protein-calorie malnutrition ([place]) 06/18/2018   Dementia with behavioral disturbance ([place]) 06/17/2018   Schizophrenia ([place]) 06/17/2018   History of lumbar fusion 06/14/2018   Bipolar disorder, unspecified ([place])  04/08/2018   Breast mass, right 08/10/2017   Lumbar post-laminectomy syndrome 06/15/2017   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   Stress at home 03/24/2017   Pre-operative examination for internal medicine 10/06/2016   RLQ abdominal pain 05/16/2016   Need for prophylactic vaccination and inoculation against influenza 03/04/2016   Allergic rhinitis 02/20/2014   Osteoporosis 12/18/2013   Expected blood loss anemia 07/20/2012   Obesity, unspecified 07/20/2012   S/P left TKA 07/19/2012   Tinnitus 09/28/2011   Well adult exam 09/28/2011   Knee pain 09/28/2011   Goiter 04/18/2009   TOBACCO USE, QUIT 04/18/2009   Chest pain, atypical 04/17/2009   Osteoarthritis 04/11/2007   Lumbar back pain 04/11/2007   COLONIC POLYPS, HX OF 04/11/2007   Past Medical History:  Diagnosis Date   Arthritis    Cancer ([place]) 13 years ago   bladder   GERD (gastroesophageal reflux disease)    Goiter    synthroid used to shrink   H/O hiatal hernia    Headache(784.0)    migraines in morning   PONV (postoperative nausea and vomiting)    Seasonal allergies     Family History  Problem Relation Age of Onset   Cancer Brother 69       pancreatic   Brain cancer Father     Past Surgical History:  Procedure Laterality Date   ARTHRODESIS FOOT WITH WEIL OSTEOTOMY Right 07/26/2014   Procedure: RIGHT HALLUX METATARSOPHALANGEAL JOINT ARTHRODESIS; RIGHT SECOND METATARSAL WEIL OSTEOTOMY; DORSAL CAPSULOTOMY EXTENSOR TENDON LENGTHENING;  Surgeon: Wylene Simmer, MD;  Location: [facility];  Service: Orthopedics;  Laterality: Right;   BLADDER SURGERY  13 years ago   BUNIONECTOMY Bilateral    COLONOSCOPY     LUMBAR FUSION  May 2014   L4-L5   ROTATOR CUFF REPAIR Bilateral ~2010, 2012   SPINAL FUSION     L1, L2   TONSILLECTOMY  as child   TOTAL KNEE ARTHROPLASTY Right 07/19/2012   Procedure: TOTAL KNEE ARTHROPLASTY;  Surgeon: Mauri Pole, MD;  Location: [facility];  Service:  Orthopedics;  Laterality: Right;   TOTAL KNEE ARTHROPLASTY Left 08/01/2013   Procedure: LEFT TOTAL KNEE ARTHROPLASTY;  Surgeon: Mauri Pole, MD;  Location: [facility];  Service: Orthopedics;  Laterality: Left;   Social History  Occupational History   Not on file  Tobacco Use   Smoking status: Former    Packs/day: 0.25    Years: 5.00    Total pack years: 1.25    Types: Cigarettes    Quit date: 06/01/1964    Years since quitting: 57.8   Smokeless tobacco: Never  Vaping Use   Vaping Use: Never used  Substance and Sexual Activity   Alcohol use: Yes    Comment: 1-2 times weekly   Drug use: No   Sexual activity: Not on file

## 2022-03-27 ENCOUNTER — Other Ambulatory Visit (HOSPITAL_BASED_OUTPATIENT_CLINIC_OR_DEPARTMENT_OTHER): Payer: Self-pay

## 2022-03-27 MED ORDER — COMIRNATY 30 MCG/0.3ML IM SUSY
PREFILLED_SYRINGE | INTRAMUSCULAR | 0 refills | Status: DC
  Filled 2022-03-27: qty 0.3, 1d supply, fill #0

## 2022-03-31 ENCOUNTER — Telehealth: Payer: Self-pay | Admitting: Specialist

## 2022-03-31 DIAGNOSIS — H401131 Primary open-angle glaucoma, bilateral, mild stage: Secondary | ICD-10-CM | POA: Diagnosis not present

## 2022-03-31 NOTE — Telephone Encounter (Signed)
I spoke with ellen at [facility] and they have the referral for Dr. Juanda Crumble branch but wanted to know if ok to book her with his pa since Dr. Harl Bowie is booked until next year. I stated it was fine. They will contact pt to schedule.

## 2022-03-31 NOTE — Telephone Encounter (Signed)
Patient husband wants to know if the referral was sent to [facility] for his wife. Best number to contact [phone] or [phone]

## 2022-04-03 DIAGNOSIS — M4722 Other spondylosis with radiculopathy, cervical region: Secondary | ICD-10-CM | POA: Diagnosis not present

## 2022-04-03 DIAGNOSIS — M47812 Spondylosis without myelopathy or radiculopathy, cervical region: Secondary | ICD-10-CM | POA: Insufficient documentation

## 2022-04-03 DIAGNOSIS — M4802 Spinal stenosis, cervical region: Secondary | ICD-10-CM | POA: Diagnosis not present

## 2022-04-03 DIAGNOSIS — M4322 Fusion of spine, cervical region: Secondary | ICD-10-CM | POA: Diagnosis not present

## 2022-04-03 DIAGNOSIS — M542 Cervicalgia: Secondary | ICD-10-CM | POA: Diagnosis not present

## 2022-04-03 DIAGNOSIS — Z880 Allergy status to penicillin: Secondary | ICD-10-CM | POA: Diagnosis not present

## 2022-04-03 DIAGNOSIS — Z881 Allergy status to other antibiotic agents status: Secondary | ICD-10-CM | POA: Diagnosis not present

## 2022-04-03 DIAGNOSIS — R202 Paresthesia of skin: Secondary | ICD-10-CM | POA: Diagnosis not present

## 2022-04-03 DIAGNOSIS — M5481 Occipital neuralgia: Secondary | ICD-10-CM | POA: Diagnosis not present

## 2022-04-03 DIAGNOSIS — R200 Anesthesia of skin: Secondary | ICD-10-CM | POA: Diagnosis not present

## 2022-04-03 DIAGNOSIS — M4312 Spondylolisthesis, cervical region: Secondary | ICD-10-CM | POA: Diagnosis not present

## 2022-04-08 ENCOUNTER — Ambulatory Visit: Payer: Medicare HMO | Admitting: Neurology

## 2022-04-08 ENCOUNTER — Encounter: Payer: Self-pay | Admitting: Neurology

## 2022-04-08 ENCOUNTER — Other Ambulatory Visit (HOSPITAL_COMMUNITY): Payer: Self-pay

## 2022-04-08 ENCOUNTER — Telehealth: Payer: Self-pay | Admitting: Neurology

## 2022-04-08 VITALS — BP 125/64 | HR 70 | Ht 60.0 in | Wt 186.0 lb

## 2022-04-08 DIAGNOSIS — M5412 Radiculopathy, cervical region: Secondary | ICD-10-CM

## 2022-04-08 NOTE — Telephone Encounter (Signed)
Patient Advocate Encounter   Received notification that prior authorization for Xeomin 100UNIT solution x 4 is required.   PA submitted on 04/08/2022 Key [id number] Status is pending       Lyndel Safe, [place] Patient Advocate Specialist [facility] Patient Advocate Team Direct Number: [phone]  Fax: [phone]

## 2022-04-08 NOTE — Progress Notes (Signed)
Chief Complaint  Patient presents with   New Patient (Initial Visit)    Rm15 NP/Internal referral for cervical spondylosis with right occipital neuralgia, consider botox C/o constant pain, on right side,       ASSESSMENT AND PLAN  Mariah Farrell is a 81 y.o. female   Right-sided neck pain, radiating pain to right occipital region Multilevel cervical degenerative changes, most noticeable on the right side,  Pending right C1, C2 nerve block by neurosurgical pain management on April 16, 2022  EMG guided xeomin injection, preauthorization asking for 400 units, will only use 1 to 200 units at initial injection  Also suggested warm compression  DIAGNOSTIC DATA (LABS, IMAGING, TESTING) - I reviewed patient records, labs, notes, testing and imaging myself where available.   MEDICAL HISTORY:  Mariah Farrell is a 81 year old female, seen in request by orthopedic surgeon Dr. Basil Dess for evaluation of right neck pain, she is accompanied by her husband Dr. Eulas Post, retired orthopedic surgeon at today's visit on April 08, 2022, PCP Plotnikov, Evie Lacks, MD   I reviewed and summarized the referring note. PMHx. Chronic insomnia Hypothyrodism GERD Lumbar decompression surgery Bilateral Rotator cuff Bilateral Knee replacement.  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 Mliss Fritz in August 2023, without significant improvement, pending nerve block by neurosurgical pain management of right  C1, 2 on April 16, 2022,  She also tried warm compression, NSAIDs as needed, gabapentin with limited help, the constant moderate to severe right neck deep achy pain, with occasionally sharp radiating pain to right occipital parietal region has affected life quality, hope to receive some relief  PHYSICAL EXAM:   Vitals:   04/08/22 1027  BP: 125/64  Pulse: 70  Weight: 186 lb (84.4 kg)  Height: 5' (1.524 m)   Not recorded     Body mass index is 36.33 kg/m.  PHYSICAL EXAMNIATION:  Gen: NAD, conversant, well nourised, well groomed                     Cardiovascular: Regular rate rhythm, no peripheral edema, warm, nontender. Eyes: Conjunctivae clear without exudates or hemorrhage Neck: No carotid bruits, Limited range of motion, more difficulty turning towards the right side, Pulmonary: Clear to auscultation bilaterally   NEUROLOGICAL EXAM:  MENTAL STATUS: Speech/cognition: Awake, alert, oriented to history taking and casual conversation CRANIAL NERVES: CN II: Visual fields are full to confrontation. Pupils are round equal and briskly reactive to light. CN III, IV, VI: extraocular movement are normal. No ptosis. CN V: Facial sensation is intact to light touch CN VII: Face is symmetric with normal eye closure  CN VIII: Hearing is normal to causal conversation. CN IX, X: Phonation is normal. CN XI: Head turning and shoulder shrug are intact  MOTOR: Abnormal neck posturing, mild right shoulder elevation, retrocollis, significant tenderness of cervical paraspinal muscles, more towards the right side, levator scapula, right upper cervical paraspinal muscles. There is no pronator drift of out-stretched arms. Muscle bulk and tone are normal. Muscle strength is normal.  REFLEXES: Reflexes  are 2+ and symmetric at the biceps, triceps, knees, and ankles. Plantar responses are flexor.  SENSORY: Intact to light touch, pinprick and vibratory sensation are intact in fingers and  toes.  COORDINATION: There is no trunk or limb dysmetria noted.  GAIT/STANCE: Posture is normal. Gait is steady with normal steps  REVIEW OF SYSTEMS:  Full 14 system review of systems performed and notable only for as above All other review of systems were negative.   ALLERGIES: Allergies  Allergen Reactions   Doxycycline Hives   Crab [Shellfish Allergy]    Nexium [Esomeprazole Magnesium]     palpitations   Penicillins    Prolia [Denosumab]     Jaw necrosis wound    HOME MEDICATIONS: Current Outpatient Medications  Medication Sig Dispense Refill   Biotin 5000 MCG TABS Take 1 tablet by mouth daily.     calcitonin, salmon, (MIACALCIN/FORTICAL) 200 UNIT/ACT nasal spray USE ONE SPRAY IN ONE NOSTRIL DAILY. ALTERNATE NOSTRILS 11.1 mL 11   calcium-vitamin D (OSCAL WITH D) 500-200 MG-UNIT per tablet Take 1 tablet by mouth daily.     cetirizine (ZYRTEC) 10 MG tablet Take 10 mg by mouth daily.     Cholecalciferol (VITAMIN D3) 2000 units capsule Take 1 capsule (2,000 Units total) by mouth daily. 100 capsule 3   COVID-19 mRNA vaccine 2023-2024 (COMIRNATY) syringe Inject into the muscle. 0.3 mL 0   diazepam (VALIUM) 5 MG tablet TAKE 1/2 TO 1 TABLET BY MOUTH EVERY 12 HOURS AS NEEDED FOR ANXIETY 90 tablet 0   diclofenac (CATAFLAM) 50 MG tablet Take 1 tablet (50 mg total) by mouth 2 (two) times daily. 60 tablet 3   Fluticasone-Umeclidin-Vilant (TRELEGY ELLIPTA) 100-62.5-25 MCG/ACT AEPB INHALE 1 PUFF INTO THE LUNGS DAILY 60 each 5   furosemide (LASIX) 20 MG tablet TAKE 1 TABLET BY MOUTH DAILY 30 tablet 0   gabapentin (NEURONTIN) 300 MG capsule TAKE ONE CAPSULE BY MOUTH EVERY NIGHT AT BEDTIME 90 capsule 3   glucosamine-chondroitin 500-400 MG tablet Take 1 tablet by mouth daily.     latanoprost (XALATAN) 0.005 % ophthalmic solution SMARTSIG:In Eye(s)     levothyroxine (SYNTHROID) 25 MCG tablet TAKE ONE TABLET BY MOUTH DAILY BEFORE BREAKFAST 90 tablet 3   methylPREDNISolone (MEDROL DOSEPAK) 4  MG TBPK tablet Take as directed a 6 day dose pak 21 tablet 0   Multiple Vitamin (MULTIVITAMIN) tablet Take 1 tablet by mouth daily.     pantoprazole (PROTONIX) 40 MG tablet TAKE ONE TABLET BY MOUTH DAILY 90 tablet 3   pregabalin (LYRICA) 50 MG capsule Take 1 capsule (50 mg total) by mouth 3 (three) times daily. 90 capsule 0   vitamin C (ASCORBIC ACID) 500 MG tablet Take 1,000 mg by mouth daily.      No current facility-administered medications for this visit.    PAST MEDICAL HISTORY: Past Medical History:  Diagnosis Date   Arthritis    Cancer ([place]) 13 years ago   bladder   GERD (gastroesophageal reflux disease)    Goiter    synthroid used to shrink   H/O hiatal hernia    Headache(784.0)    migraines in morning   PONV (postoperative nausea and vomiting)    Seasonal allergies     PAST SURGICAL HISTORY: Past Surgical History:  Procedure Laterality Date   ARTHRODESIS FOOT WITH WEIL OSTEOTOMY Right 07/26/2014   Procedure: RIGHT HALLUX METATARSOPHALANGEAL JOINT ARTHRODESIS; RIGHT SECOND METATARSAL WEIL OSTEOTOMY; DORSAL CAPSULOTOMY EXTENSOR TENDON LENGTHENING;  Surgeon: Wylene Simmer, MD;  Location: [facility];  Service: Orthopedics;  Laterality: Right;   BLADDER SURGERY  13 years ago   BUNIONECTOMY Bilateral    COLONOSCOPY     LUMBAR FUSION  May 2014   L4-L5   ROTATOR CUFF REPAIR Bilateral ~2010, 2012   SPINAL FUSION     L1, L2   TONSILLECTOMY  as child   TOTAL KNEE ARTHROPLASTY Right 07/19/2012   Procedure: TOTAL KNEE ARTHROPLASTY;  Surgeon: Mauri Pole, MD;  Location: [facility];  Service: Orthopedics;  Laterality: Right;   TOTAL KNEE ARTHROPLASTY Left 08/01/2013   Procedure: LEFT TOTAL KNEE ARTHROPLASTY;  Surgeon: Mauri Pole, MD;  Location: [facility];  Service: Orthopedics;  Laterality: Left;    FAMILY HISTORY: Family History  Problem Relation Age of Onset   Cancer Brother 1       pancreatic   Brain cancer Father     SOCIAL HISTORY: Social History    Socioeconomic History   Marital status: Married    Spouse name: Not on file   Number of children: Not on file   Years of education: Not on file   Highest education level: Not on file  Occupational History   Not on file  Tobacco Use   Smoking status: Former    Packs/day: 0.25    Years: 5.00    Total pack years: 1.25    Types: Cigarettes    Quit date: 06/01/1964    Years since quitting: 57.8   Smokeless tobacco: Never  Vaping Use   Vaping Use: Never used  Substance and Sexual Activity   Alcohol use: Yes    Comment: 1-2 times weekly   Drug use: No   Sexual activity: Not on file  Other Topics Concern   Not on file  Social History Narrative   Not on file   Social Determinants of Health   Financial Resource Strain: Low Risk  (10/06/2021)   Overall Financial Resource Strain (CARDIA)    Difficulty of Paying Living Expenses: Not hard at all  Food Insecurity: No Food Insecurity (10/06/2021)   Hunger Vital Sign    Worried About Running Out of Food in the Last Year: Never true    [place] in the Last Year: Never true  Transportation Needs: No Transportation Needs (10/06/2021)   PRAPARE - Hydrologist (Medical): No    Lack of Transportation (Non-Medical): No  Physical Activity: Sufficiently Active (10/06/2021)   Exercise Vital Sign    Days of Exercise per Week: 5 days    Minutes of Exercise per Session: 30 min  Stress: No Stress Concern Present (10/06/2021)   [place]    Feeling of Stress : Not at all  Social Connections: [place] (10/06/2021)   Social Connection and Isolation Panel [NHANES]    Frequency of Communication with Friends and Family: More than three times a week    Frequency of Social Gatherings with Friends and Family: Twice a week    Attends Religious Services: More than 4 times per year    Active Member of [employer] or Organizations: Yes    Attends Programme researcher, broadcasting/film/video: More than 4 times per year    Marital Status: Married  Human resources officer Violence: Not At Risk (10/06/2021)   Humiliation, Afraid, Rape, and Kick questionnaire    Fear of Current or Ex-Partner: No    Emotionally Abused: No    Physically Abused: No    Sexually  Abused: No      Marcial Pacas, M.D. Ph.D.  [facility] [address] [place], [place] [address] Ph: [phone] Fax: [phone]  CC:  Jessy Oto, MD No address on file  Plotnikov, Evie Lacks, MD

## 2022-04-08 NOTE — Telephone Encounter (Signed)
Submitted benefit verification to [employer].   Case ID: [id number]

## 2022-04-08 NOTE — Telephone Encounter (Signed)
Dr Krista Blue requesting to start patient on Xeomin 400 units for the patient  J Code- M3817  With electrical stimulation- [phone] CPT [phone] Chemical Denervation of the Neck Muscles ICD code: G24.3

## 2022-04-09 ENCOUNTER — Other Ambulatory Visit (HOSPITAL_COMMUNITY): Payer: Self-pay

## 2022-04-09 MED ORDER — XEOMIN 100 UNITS IM SOLR
400.0000 [IU] | Freq: Once | INTRAMUSCULAR | 0 refills | Status: DC
  Filled 2022-04-09: qty 4, 1d supply, fill #0

## 2022-04-09 NOTE — Telephone Encounter (Signed)
Patient Advocate Encounter  Prior Authorization for Xeomin 100UNIT solution x 4  has been approved.    PA# [id number] Effective dates: 04/09/2022 through 06/01/2023  Can Be Filled at [place], [place] Patient Advocate Specialist [place] Patient Advocate Team Direct Number: [phone]  Fax: [phone]

## 2022-04-09 NOTE — Addendum Note (Signed)
Addended by: Gerline Legacy C on: 04/09/2022 03:00 PM   Modules accepted: Orders

## 2022-04-10 ENCOUNTER — Other Ambulatory Visit (HOSPITAL_COMMUNITY): Payer: Self-pay

## 2022-04-13 ENCOUNTER — Other Ambulatory Visit: Payer: Self-pay | Admitting: Neurology

## 2022-04-13 ENCOUNTER — Other Ambulatory Visit (HOSPITAL_COMMUNITY): Payer: Self-pay

## 2022-04-13 MED ORDER — XEOMIN 100 UNITS IM SOLR
400.0000 [IU] | INTRAMUSCULAR | 0 refills | Status: AC
  Filled 2022-04-13 – 2022-05-11 (×2): qty 4, 90d supply, fill #0

## 2022-04-13 MED ORDER — XEOMIN 100 UNITS IM SOLR
400.0000 [IU] | Freq: Once | INTRAMUSCULAR | 0 refills | Status: DC
  Filled 2022-04-13: qty 4, 1d supply, fill #0

## 2022-04-13 NOTE — Telephone Encounter (Signed)
Noted. Pt also has an apt in December with Dr Krista Blue already scheduled. We can always use that time slot if the patient does move forward with Xeomin. I will call and place a hold on the xeomin order and wait for the patient to call back.  Called the [facility] and spoke with Tiffany and advised her to cancel the delivery scheduled for the patient on wed. Advised she can place script on hold and we will call back to schedule this if the patient decides to move forward.

## 2022-04-13 NOTE — Addendum Note (Signed)
Addended by: Darleen Crocker on: 04/13/2022 03:34 PM   Modules accepted: Orders

## 2022-04-13 NOTE — Telephone Encounter (Signed)
Called pt to schedule xeomin injection and pt stated that she has an injection at [facility] scheduled for 11/16 and would like to see if that works before she schedules an injection with Dr. Krista Blue. Pt stated she will call back to schedule if other injection does not work.

## 2022-04-13 NOTE — Telephone Encounter (Signed)
Called [facility] outpt pharmacy to check on status of the xeomin script. Spoke with Tiffany who states that she is able to get the medication delivered to our office on wed 04/15/2022.

## 2022-04-16 DIAGNOSIS — M5481 Occipital neuralgia: Secondary | ICD-10-CM | POA: Insufficient documentation

## 2022-04-16 DIAGNOSIS — M47812 Spondylosis without myelopathy or radiculopathy, cervical region: Secondary | ICD-10-CM | POA: Diagnosis not present

## 2022-04-20 ENCOUNTER — Other Ambulatory Visit (HOSPITAL_COMMUNITY): Payer: Self-pay

## 2022-04-20 DIAGNOSIS — M47812 Spondylosis without myelopathy or radiculopathy, cervical region: Secondary | ICD-10-CM | POA: Diagnosis not present

## 2022-04-20 NOTE — Telephone Encounter (Signed)
Pt called back. Stated she would like her Xeomin shot on the day already scheduled with Dr.Yan

## 2022-04-27 ENCOUNTER — Telehealth: Payer: Self-pay | Admitting: Internal Medicine

## 2022-04-27 MED ORDER — GABAPENTIN 300 MG PO CAPS: 300.0000 mg | ORAL_CAPSULE | Freq: Every day | ORAL | 0 refills | Status: DC

## 2022-04-27 MED ORDER — CELECOXIB 200 MG PO CAPS: 200.0000 mg | ORAL_CAPSULE | Freq: Every day | ORAL | 1 refills | Status: DC | PRN

## 2022-04-27 NOTE — Telephone Encounter (Signed)
Caller & Relationship to patient: PT  Call back number: [phone]  Date of last office visit: 09/22/2021  Date of next office visit: 06/24/2022  Medication(s) to be refilled:  gabapentin (NEURONTIN) 300 MG capsule    CELEBGREX 200 MG     Preferred Pharmacy:   [facility] [id number] - [address], [place]

## 2022-04-27 NOTE — Telephone Encounter (Signed)
Done. Thx.

## 2022-04-27 NOTE — Telephone Encounter (Signed)
Pls advise on Celebrex has not been refilled by PCP.Marland KitchenJohny Farrell

## 2022-04-28 ENCOUNTER — Other Ambulatory Visit: Payer: Self-pay | Admitting: Radiology

## 2022-04-28 MED ORDER — FUROSEMIDE 20 MG PO TABS: 20.0000 mg | ORAL_TABLET | Freq: Every day | ORAL | 0 refills | Status: DC

## 2022-05-01 DIAGNOSIS — M47812 Spondylosis without myelopathy or radiculopathy, cervical region: Secondary | ICD-10-CM | POA: Diagnosis not present

## 2022-05-01 DIAGNOSIS — M5481 Occipital neuralgia: Secondary | ICD-10-CM | POA: Diagnosis not present

## 2022-05-11 ENCOUNTER — Ambulatory Visit: Payer: Medicare HMO | Admitting: Neurology

## 2022-05-11 ENCOUNTER — Telehealth: Payer: Self-pay | Admitting: Neurology

## 2022-05-11 ENCOUNTER — Other Ambulatory Visit (HOSPITAL_COMMUNITY): Payer: Self-pay

## 2022-05-11 ENCOUNTER — Encounter: Payer: Self-pay | Admitting: Neurology

## 2022-05-11 VITALS — BP 167/87 | HR 74 | Ht 60.0 in | Wt 186.0 lb

## 2022-05-11 DIAGNOSIS — G8929 Other chronic pain: Secondary | ICD-10-CM | POA: Diagnosis not present

## 2022-05-11 DIAGNOSIS — R519 Headache, unspecified: Secondary | ICD-10-CM

## 2022-05-11 DIAGNOSIS — M5412 Radiculopathy, cervical region: Secondary | ICD-10-CM

## 2022-05-11 MED ORDER — DULOXETINE HCL 20 MG PO CPEP: 20.0000 mg | ORAL_CAPSULE | Freq: Every day | ORAL | 0 refills | Status: DC

## 2022-05-11 MED ORDER — DULOXETINE HCL 60 MG PO CPEP: 60.0000 mg | ORAL_CAPSULE | Freq: Every day | ORAL | 3 refills | Status: DC

## 2022-05-11 NOTE — Progress Notes (Signed)
Chief Complaint  Patient presents with   Procedure    Xeomin       ASSESSMENT AND PLAN  Mariah Farrell is a 81 y.o. female   Right-sided neck pain, radiating pain to right occipital region Multilevel cervical degenerative changes, most noticeable on the right side,  She had right C1, C2 nerve block by neurosurgical pain management on April 16, 2022, with limited improvement  Will add on Cymbalta 20 mg daily, titrating to 60 mg daily   This is her first EMG guided xeomin injection, used 200 units today   Right levator scapular 25 units Right iliocostalis muscle 25 units Right splenius cervix 25 units Right splenius capitis 25 units Right longissimus capitis 25 units Right semispinalis 25 units  Left levator scapula 25 units Left iliocostalis 25 units  Frequent headaches  Family history of glioblastoma  MRI of brain  DIAGNOSTIC DATA (LABS, IMAGING, TESTING) - I reviewed patient records, labs, notes, testing and imaging myself where available.   MEDICAL HISTORY:  Mariah Farrell is a 81 year old female, seen in request by orthopedic surgeon Dr. Basil Dess for evaluation of right neck pain, she is accompanied by her husband Dr. Eulas Post, retired orthopedic surgeon at today's visit on April 08, 2022, PCP Plotnikov, Evie Lacks, MD   I reviewed and summarized the referring note. PMHx. Chronic insomnia Hypothyrodism GERD Lumbar decompression surgery Bilateral Rotator cuff Bilateral Knee replacement.  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 Mliss Fritz in August 2023, without significant improvement, pending nerve block by neurosurgical pain management of right C1, 2 on April 16, 2022,  She also tried warm compression, NSAIDs as needed, gabapentin with limited help, the constant moderate to severe right neck deep achy pain, with occasionally sharp radiating pain to right occipital parietal region has affected life quality, hope to receive some relief  Update May 11, 2022 She had right C1-C2 facet joint injection with only very temporary relief, constant right upper cervical region deep achy pain, difficulty sleeping, limited her daily activity  She is taking Celebrex 200 mg as needed along with Valium 2.5 mg as needed, gabapentin 300 mg 1 to 2 tablets at nighttime, frequent Aleve,   In addition she complains of intermittent headaches, often triggered by sharp radiating pain from neck,  PHYSICAL EXAM:    PHYSICAL EXAMNIATION:  Gen: NAD, conversant, well nourised, well groomed                     Cardiovascular: Regular rate rhythm, no peripheral edema, warm, nontender. Eyes: Conjunctivae clear without exudates or hemorrhage Neck: No carotid bruits, Limited range of motion, more difficulty turning towards the right side, Pulmonary: Clear to auscultation bilaterally   NEUROLOGICAL EXAM:  MENTAL STATUS: Speech/cognition: Awake, alert, oriented to history taking and casual conversation CRANIAL NERVES: CN II: Visual fields are full to confrontation. Pupils are round equal and briskly reactive to light. CN III, IV, VI: extraocular movement are normal. No ptosis. CN V: Facial sensation is intact to light touch CN VII: Face is  symmetric with normal eye closure  CN VIII: Hearing is normal to causal conversation. CN IX, X: Phonation is normal. CN XI: Head turning and shoulder shrug are intact  MOTOR: Abnormal neck posturing, mild right shoulder  elevation, retrocollis, significant tenderness of cervical paraspinal muscles, more towards the right side, bilateral levator scapula, right upper cervical paraspinal muscles.   Normal strength  REFLEXES: Reflexes are 2 and symmetric at the biceps, triceps, knees, and ankles. Plantar responses are flexor.  SENSORY: Intact to light touch,   COORDINATION: There is no trunk or limb dysmetria noted.  GAIT/STANCE: Need push-up to get up from seated position, cautious  REVIEW OF SYSTEMS:  Full 14 system review of systems performed and notable only for as above All other review of systems were negative.   ALLERGIES: Allergies  Allergen Reactions   Doxycycline Hives   Crab [Shellfish Allergy]    Nexium [Esomeprazole Magnesium]     palpitations   Penicillins    Prolia [Denosumab]     Jaw necrosis wound    HOME MEDICATIONS: Current Outpatient Medications  Medication Sig Dispense Refill   Biotin 5000 MCG TABS Take 1 tablet by mouth daily.     calcitonin, salmon, (MIACALCIN/FORTICAL) 200 UNIT/ACT nasal spray USE ONE SPRAY IN ONE NOSTRIL DAILY. ALTERNATE NOSTRILS 11.1 mL 11   calcium-vitamin D (OSCAL WITH D) 500-200 MG-UNIT per tablet Take 1 tablet by mouth daily.     celecoxib (CELEBREX) 200 MG capsule Take 1 capsule (200 mg total) by mouth daily as needed. 90 capsule 1   cetirizine (ZYRTEC) 10 MG tablet Take 10 mg by mouth daily.     Cholecalciferol (VITAMIN D3) 2000 units capsule Take 1 capsule (2,000 Units total) by mouth daily. 100 capsule 3   COVID-19 mRNA vaccine 2023-2024 (COMIRNATY) syringe Inject into the muscle. 0.3 mL 0   diazepam (VALIUM) 5 MG tablet TAKE 1/2 TO 1 TABLET BY MOUTH EVERY 12 HOURS AS NEEDED FOR ANXIETY 90 tablet 0   diclofenac (CATAFLAM) 50 MG tablet Take 1 tablet (50 mg total) by mouth 2 (two) times daily. 60 tablet 3   Fluticasone-Umeclidin-Vilant (TRELEGY ELLIPTA) 100-62.5-25 MCG/ACT AEPB INHALE 1 PUFF INTO THE LUNGS DAILY 60 each 5   furosemide (LASIX)  20 MG tablet Take 1 tablet (20 mg total) by mouth daily. 30 tablet 0   gabapentin (NEURONTIN) 300 MG capsule Take 1 capsule (300 mg total) by mouth at bedtime. 90 capsule 0   glucosamine-chondroitin 500-400 MG tablet Take 1 tablet by mouth daily.     incobotulinumtoxinA (XEOMIN) 100 units SOLR injection Inject 400 Units into the muscle every 3 (three) months. Inject 400 units into the muscle every 90 days. 4 each 0   latanoprost (XALATAN) 0.005 % ophthalmic solution SMARTSIG:In Eye(s)     levothyroxine (SYNTHROID) 25 MCG tablet TAKE ONE TABLET BY MOUTH DAILY BEFORE BREAKFAST 90 tablet 3   methylPREDNISolone (MEDROL DOSEPAK) 4 MG TBPK tablet Take as directed a 6 day dose pak 21 tablet 0   Multiple Vitamin (MULTIVITAMIN) tablet Take 1 tablet by mouth daily.     pantoprazole (PROTONIX) 40 MG tablet TAKE ONE TABLET BY MOUTH DAILY 90 tablet 3   vitamin C (ASCORBIC ACID) 500 MG tablet Take 1,000 mg by mouth daily.      No current facility-administered medications for this visit.    PAST MEDICAL HISTORY: Past Medical History:  Diagnosis Date   Arthritis    Cancer ([place]) 13 years ago   bladder   GERD (gastroesophageal reflux  disease)    Goiter    synthroid used to shrink   H/O hiatal hernia    Headache(784.0)    migraines in morning   PONV (postoperative nausea and vomiting)    Seasonal allergies     PAST SURGICAL HISTORY: Past Surgical History:  Procedure Laterality Date   ARTHRODESIS FOOT WITH WEIL OSTEOTOMY Right 07/26/2014   Procedure: RIGHT HALLUX METATARSOPHALANGEAL JOINT ARTHRODESIS; RIGHT SECOND METATARSAL WEIL OSTEOTOMY; DORSAL CAPSULOTOMY EXTENSOR TENDON LENGTHENING;  Surgeon: Wylene Simmer, MD;  Location: [place];  Service: Orthopedics;  Laterality: Right;   BLADDER SURGERY  13 years ago   BUNIONECTOMY Bilateral    COLONOSCOPY     LUMBAR FUSION  May 2014   L4-L5   ROTATOR CUFF REPAIR Bilateral ~2010, 2012   SPINAL FUSION     L1, L2   TONSILLECTOMY  as child    TOTAL KNEE ARTHROPLASTY Right 07/19/2012   Procedure: TOTAL KNEE ARTHROPLASTY;  Surgeon: Mauri Pole, MD;  Location: [facility];  Service: Orthopedics;  Laterality: Right;   TOTAL KNEE ARTHROPLASTY Left 08/01/2013   Procedure: LEFT TOTAL KNEE ARTHROPLASTY;  Surgeon: Mauri Pole, MD;  Location: [facility];  Service: Orthopedics;  Laterality: Left;    FAMILY HISTORY: Family History  Problem Relation Age of Onset   Cancer Brother 5       pancreatic   Brain cancer Father     SOCIAL HISTORY: Social History   Socioeconomic History   Marital status: Married    Spouse name: Not on file   Number of children: Not on file   Years of education: Not on file   Highest education level: Not on file  Occupational History   Not on file  Tobacco Use   Smoking status: Former    Packs/day: 0.25    Years: 5.00    Total pack years: 1.25    Types: Cigarettes    Quit date: 06/01/1964    Years since quitting: 57.9   Smokeless tobacco: Never  Vaping Use   Vaping Use: Never used  Substance and Sexual Activity   Alcohol use: Yes    Comment: 1-2 times weekly   Drug use: No   Sexual activity: Not on file  Other Topics Concern   Not on file  Social History Narrative   Not on file   Social Determinants of Health   Financial Resource Strain: Low Risk  (10/06/2021)   Overall Financial Resource Strain (CARDIA)    Difficulty of Paying Living Expenses: Not hard at all  Food Insecurity: No Food Insecurity (10/06/2021)   Hunger Vital Sign    Worried About Running Out of Food in the Last Year: Never true    [place] in the Last Year: Never true  Transportation Needs: No Transportation Needs (10/06/2021)   PRAPARE - Hydrologist (Medical): No    Lack of Transportation (Non-Medical): No  Physical Activity: Sufficiently Active (10/06/2021)   Exercise Vital Sign    Days of Exercise per Week: 5 days    Minutes of Exercise per Session: 30 min  Stress: No Stress Concern Present  (10/06/2021)   [place]    Feeling of Stress : Not at all  Social Connections: [place] (10/06/2021)   Social Connection and Isolation Panel [NHANES]    Frequency of Communication with Friends and Family: More than three times a week    Frequency of Social Gatherings with  Friends and Family: Twice a week    Attends Religious Services: More than 4 times per year    Active Member of [employer] or Organizations: Yes    Attends Archivist Meetings: More than 4 times per year    Marital Status: Married  Human resources officer Violence: Not At Risk (10/06/2021)   Humiliation, Afraid, Rape, and Kick questionnaire    Fear of Current or Ex-Partner: No    Emotionally Abused: No    Physically Abused: No    Sexually Abused: No      Marcial Pacas, M.D. Ph.D.  [facility] [address], [place], [place] [address] Ph: [phone] Fax: [phone]  CC:  Plotnikov, Evie Lacks, MD [place],  [place] [address]  Plotnikov, Evie Lacks, MD

## 2022-05-11 NOTE — Progress Notes (Signed)
Xeomin 100 unit x 2 vials  [id number] Exp-09/25 SP

## 2022-05-11 NOTE — Telephone Encounter (Signed)
Sent to GI for scheduling, they obtain [facility] auth.

## 2022-05-12 ENCOUNTER — Other Ambulatory Visit (HOSPITAL_COMMUNITY): Payer: Self-pay

## 2022-05-13 ENCOUNTER — Other Ambulatory Visit (HOSPITAL_COMMUNITY): Payer: Self-pay

## 2022-05-21 ENCOUNTER — Other Ambulatory Visit: Payer: Self-pay | Admitting: Internal Medicine

## 2022-05-22 ENCOUNTER — Ambulatory Visit
Admission: RE | Admit: 2022-05-22 | Discharge: 2022-05-22 | Disposition: A | Discharge status: Home / Self Care | Payer: Medicare HMO | Source: Ambulatory Visit | Attending: Neurology | Admitting: Neurology

## 2022-05-22 DIAGNOSIS — M5412 Radiculopathy, cervical region: Secondary | ICD-10-CM

## 2022-05-22 DIAGNOSIS — G8929 Other chronic pain: Secondary | ICD-10-CM

## 2022-05-22 DIAGNOSIS — R519 Headache, unspecified: Secondary | ICD-10-CM | POA: Diagnosis not present

## 2022-06-01 ENCOUNTER — Other Ambulatory Visit: Payer: Self-pay | Admitting: Orthopaedic Surgery

## 2022-06-03 ENCOUNTER — Other Ambulatory Visit: Payer: Self-pay | Admitting: Neurology

## 2022-06-03 DIAGNOSIS — M4802 Spinal stenosis, cervical region: Secondary | ICD-10-CM | POA: Diagnosis not present

## 2022-06-03 DIAGNOSIS — M47812 Spondylosis without myelopathy or radiculopathy, cervical region: Secondary | ICD-10-CM | POA: Diagnosis not present

## 2022-06-03 DIAGNOSIS — M810 Age-related osteoporosis without current pathological fracture: Secondary | ICD-10-CM | POA: Insufficient documentation

## 2022-06-03 DIAGNOSIS — Z9889 Other specified postprocedural states: Secondary | ICD-10-CM | POA: Diagnosis not present

## 2022-06-03 DIAGNOSIS — M5481 Occipital neuralgia: Secondary | ICD-10-CM | POA: Diagnosis not present

## 2022-06-05 ENCOUNTER — Telehealth: Payer: Self-pay | Admitting: Neurology

## 2022-06-05 NOTE — Telephone Encounter (Signed)
Pt said, want to know if need to refill or up the dosage for DULoxetine (CYMBALTA) 20 MG capsule. Pharmacy said refill was denied. Would like a call back.

## 2022-06-07 ENCOUNTER — Other Ambulatory Visit: Payer: Self-pay | Admitting: Neurology

## 2022-06-11 ENCOUNTER — Other Ambulatory Visit: Payer: Self-pay | Admitting: Neurology

## 2022-06-16 DIAGNOSIS — Z780 Asymptomatic menopausal state: Secondary | ICD-10-CM | POA: Diagnosis not present

## 2022-06-16 DIAGNOSIS — M810 Age-related osteoporosis without current pathological fracture: Secondary | ICD-10-CM | POA: Diagnosis not present

## 2022-06-16 DIAGNOSIS — M5481 Occipital neuralgia: Secondary | ICD-10-CM | POA: Diagnosis not present

## 2022-06-24 ENCOUNTER — Ambulatory Visit (INDEPENDENT_AMBULATORY_CARE_PROVIDER_SITE_OTHER): Payer: Medicare HMO | Admitting: Internal Medicine

## 2022-06-24 ENCOUNTER — Encounter: Payer: Self-pay | Admitting: Internal Medicine

## 2022-06-24 VITALS — BP 130/80 | HR 70 | Temp 98.7°F | Ht 60.0 in | Wt 188.0 lb

## 2022-06-24 DIAGNOSIS — R202 Paresthesia of skin: Secondary | ICD-10-CM

## 2022-06-24 DIAGNOSIS — M1990 Unspecified osteoarthritis, unspecified site: Secondary | ICD-10-CM

## 2022-06-24 DIAGNOSIS — E559 Vitamin D deficiency, unspecified: Secondary | ICD-10-CM | POA: Diagnosis not present

## 2022-06-24 DIAGNOSIS — M810 Age-related osteoporosis without current pathological fracture: Secondary | ICD-10-CM

## 2022-06-24 DIAGNOSIS — I10 Essential (primary) hypertension: Secondary | ICD-10-CM

## 2022-06-24 DIAGNOSIS — M5481 Occipital neuralgia: Secondary | ICD-10-CM | POA: Diagnosis not present

## 2022-06-24 LAB — CBC WITH DIFFERENTIAL/PLATELET
Basophils Absolute: 0.1 10*3/uL (ref 0.0–0.1)
Basophils Relative: 1.4 % (ref 0.0–3.0)
Eosinophils Absolute: 0.3 10*3/uL (ref 0.0–0.7)
Eosinophils Relative: 5.7 % — ABNORMAL HIGH (ref 0.0–5.0)
HCT: 38 % (ref 36.0–46.0)
Hemoglobin: 13 g/dL (ref 12.0–15.0)
Lymphocytes Relative: 19.4 % (ref 12.0–46.0)
Lymphs Abs: 1 10*3/uL (ref 0.7–4.0)
MCHC: 34.1 g/dL (ref 30.0–36.0)
MCV: 97.4 fl (ref 78.0–100.0)
Monocytes Absolute: 0.6 10*3/uL (ref 0.1–1.0)
Monocytes Relative: 11.4 % (ref 3.0–12.0)
Neutro Abs: 3.2 10*3/uL (ref 1.4–7.7)
Neutrophils Relative %: 62.1 % (ref 43.0–77.0)
Platelets: 301 10*3/uL (ref 150.0–400.0)
RBC: 3.9 Mil/uL (ref 3.87–5.11)
RDW: 13.2 % (ref 11.5–15.5)
WBC: 5.1 10*3/uL (ref 4.0–10.5)

## 2022-06-24 LAB — VITAMIN B12: Vitamin B-12: >1500 pg/mL — ABNORMAL HIGH (ref 211–911)

## 2022-06-24 LAB — LIPID PANEL
Cholesterol: 199 mg/dL (ref 0–200)
HDL: 55 mg/dL (ref >39.00)
LDL Cholesterol: 115 mg/dL — ABNORMAL HIGH (ref 0–99)
NonHDL: 143.69
Total CHOL/HDL Ratio: 4
Triglycerides: 144 mg/dL (ref 0.0–149.0)
VLDL: 28.8 mg/dL (ref 0.0–40.0)

## 2022-06-24 LAB — COMPREHENSIVE METABOLIC PANEL
ALT: 17 U/L (ref 0–35)
AST: 23 U/L (ref 0–37)
Albumin: 4.1 g/dL (ref 3.5–5.2)
Alkaline Phosphatase: 51 U/L (ref 39–117)
BUN: 19 mg/dL (ref 6–23)
CO2: 29 mEq/L (ref 19–32)
Calcium: 9.6 mg/dL (ref 8.4–10.5)
Chloride: 102 mEq/L (ref 96–112)
Creatinine, Ser: 1.26 mg/dL — ABNORMAL HIGH (ref 0.40–1.20)
GFR: 39.47 mL/min — ABNORMAL LOW (ref >60.00)
Glucose, Bld: 85 mg/dL (ref 70–99)
Potassium: 4.2 mEq/L (ref 3.5–5.1)
Sodium: 139 mEq/L (ref 135–145)
Total Bilirubin: 0.6 mg/dL (ref 0.2–1.2)
Total Protein: 6.5 g/dL (ref 6.0–8.3)

## 2022-06-24 LAB — URINALYSIS, ROUTINE W REFLEX MICROSCOPIC
Bilirubin Urine: NEGATIVE
Hgb urine dipstick: NEGATIVE
Ketones, ur: NEGATIVE
Nitrite: NEGATIVE
Specific Gravity, Urine: 1.01 (ref 1.000–1.030)
Total Protein, Urine: NEGATIVE
Urine Glucose: NEGATIVE
Urobilinogen, UA: 0.2 (ref 0.0–1.0)
pH: 6.5 (ref 5.0–8.0)

## 2022-06-24 LAB — TSH: TSH: 0.83 u[IU]/mL (ref 0.35–5.50)

## 2022-06-24 LAB — VITAMIN D 25 HYDROXY (VIT D DEFICIENCY, FRACTURES): VITD: 69.79 ng/mL (ref 30.00–100.00)

## 2022-06-24 MED ORDER — DULOXETINE HCL 60 MG PO CPEP: 60.0000 mg | ORAL_CAPSULE | Freq: Every day | ORAL | 3 refills | Status: DC

## 2022-06-24 MED ORDER — CELECOXIB 200 MG PO CAPS: 200.0000 mg | ORAL_CAPSULE | Freq: Every day | ORAL | 1 refills | Status: DC | PRN

## 2022-06-24 MED ORDER — DIAZEPAM 5 MG PO TABS: ORAL_TABLET | ORAL | 1 refills | Status: DC

## 2022-06-24 MED ORDER — DULOXETINE HCL 20 MG PO CPEP: 20.0000 mg | ORAL_CAPSULE | Freq: Every day | ORAL | 3 refills | Status: DC

## 2022-06-24 NOTE — Assessment & Plan Note (Signed)
On Vit D 

## 2022-06-24 NOTE — Assessment & Plan Note (Signed)
  BP Readings from Last 3 Encounters:  06/24/22 130/80  05/11/22 (!) 167/87  04/08/22 125/64

## 2022-06-24 NOTE — Assessment & Plan Note (Signed)
On Miacalcin

## 2022-06-24 NOTE — Assessment & Plan Note (Signed)
On Celebrex w/caution

## 2022-06-24 NOTE — Assessment & Plan Note (Signed)
Blue-Emu cream was recommended to use 2-3 times a day Heat On Cymbalta, diazepam

## 2022-06-24 NOTE — Patient Instructions (Signed)
TENS unit Blue-Emu cream -- use 2-3 times a day

## 2022-06-24 NOTE — Progress Notes (Signed)
Subjective:  Patient ID: Mariah Farrell, female    DOB: Nov 28, 1940  Age: 82 y.o. MRN: [id number]  CC: No chief complaint on file.   HPI Mariah Farrell presents for cervical pain, occipital neuralgia - had the variety of treatments - most of them failed, some helped (Neurology, NS, Ortho consults since July 2023). F/u insomnia, anxiety   Outpatient Medications Prior to Visit  Medication Sig Dispense Refill   Biotin 5000 MCG TABS Take 1 tablet by mouth daily.     calcitonin, salmon, (MIACALCIN/FORTICAL) 200 UNIT/ACT nasal spray USE ONE SPRAY IN ONE NOSTRIL DAILY. ALTERNATE NOSTRILS 11.1 mL 11   calcium-vitamin D (OSCAL WITH D) 500-200 MG-UNIT per tablet Take 1 tablet by mouth daily.     cetirizine (ZYRTEC) 10 MG tablet Take 10 mg by mouth daily.     Cholecalciferol (VITAMIN D3) 2000 units capsule Take 1 capsule (2,000 Units total) by mouth daily. 100 capsule 3   diclofenac (CATAFLAM) 50 MG tablet Take 1 tablet (50 mg total) by mouth 2 (two) times daily. 60 tablet 3   furosemide (LASIX) 20 MG tablet TAKE 1 TABLET BY MOUTH DAILY 30 tablet 0   gabapentin (NEURONTIN) 300 MG capsule Take 1 capsule (300 mg total) by mouth at bedtime. 90 capsule 0   glucosamine-chondroitin 500-400 MG tablet Take 1 tablet by mouth daily.     incobotulinumtoxinA (XEOMIN) 100 units SOLR injection Inject 400 Units into the muscle every 3 (three) months. Inject 400 units into the muscle every 90 days. 4 each 0   latanoprost (XALATAN) 0.005 % ophthalmic solution SMARTSIG:In Eye(s)     levothyroxine (SYNTHROID) 25 MCG tablet TAKE ONE TABLET BY MOUTH DAILY BEFORE BREAKFAST 90 tablet 3   Multiple Vitamin (MULTIVITAMIN) tablet Take 1 tablet by mouth daily.     pantoprazole (PROTONIX) 40 MG tablet TAKE ONE TABLET BY MOUTH DAILY 90 tablet 3   vitamin C (ASCORBIC ACID) 500 MG tablet Take 1,000 mg by mouth daily.      celecoxib (CELEBREX) 200 MG capsule Take 1 capsule (200 mg total) by mouth daily as needed. 90  capsule 1   diazepam (VALIUM) 5 MG tablet TAKE 1/2 TO 1 TABLET BY MOUTH EVERY 12 HOURS AS NEEDED FOR ANXIETY 90 tablet 0   DULoxetine (CYMBALTA) 20 MG capsule Take 1 capsule (20 mg total) by mouth daily. 30 capsule 0   DULoxetine (CYMBALTA) 60 MG capsule Take 1 capsule (60 mg total) by mouth daily. Fill cymbalta '20mg'$  first 60 capsule 3   COVID-19 mRNA vaccine 2023-2024 (COMIRNATY) syringe Inject into the muscle. (Patient not taking: Reported on 06/24/2022) 0.3 mL 0   No facility-administered medications prior to visit.    ROS: Review of Systems  Constitutional:  Negative for activity change, appetite change, chills, fatigue and unexpected weight change.  HENT:  Negative for congestion, mouth sores and sinus pressure.   Eyes:  Negative for visual disturbance.  Respiratory:  Negative for cough and chest tightness.   Gastrointestinal:  Negative for abdominal pain and nausea.  Genitourinary:  Negative for difficulty urinating, frequency and vaginal pain.  Musculoskeletal:  Positive for arthralgias, back pain, gait problem, neck pain and neck stiffness.  Skin:  Negative for pallor and rash.  Neurological:  Negative for dizziness, tremors, weakness, numbness and headaches.  Psychiatric/Behavioral:  Positive for sleep disturbance. Negative for confusion. The patient is nervous/anxious.     Objective:  BP 130/80 (BP Location: Right Arm, Patient Position: Sitting, Cuff Size: Normal)   Pulse  70   Temp 98.7 F (37.1 C) (Oral)   Ht 5' (1.524 m)   Wt 188 lb (85.3 kg)   SpO2 95%   BMI 36.72 kg/m   BP Readings from Last 3 Encounters:  06/24/22 130/80  05/11/22 (!) 167/87  04/08/22 125/64    Wt Readings from Last 3 Encounters:  06/24/22 188 lb (85.3 kg)  05/11/22 186 lb (84.4 kg)  04/08/22 186 lb (84.4 kg)    Physical Exam Constitutional:      General: She is not in acute distress.    Appearance: She is well-developed. She is obese.  HENT:     Head: Normocephalic.     Right Ear:  External ear normal.     Left Ear: External ear normal.     Nose: Nose normal.  Eyes:     General:        Right eye: No discharge.        Left eye: No discharge.     Conjunctiva/sclera: Conjunctivae normal.     Pupils: Pupils are equal, round, and reactive to light.  Neck:     Thyroid: No thyromegaly.     Vascular: No JVD.     Trachea: No tracheal deviation.  Cardiovascular:     Rate and Rhythm: Normal rate and regular rhythm.     Heart sounds: Normal heart sounds.  Pulmonary:     Effort: No respiratory distress.     Breath sounds: No stridor. No wheezing.  Abdominal:     General: Bowel sounds are normal. There is no distension.     Palpations: Abdomen is soft. There is no mass.     Tenderness: There is no abdominal tenderness. There is no guarding or rebound.  Musculoskeletal:        General: No tenderness.     Cervical back: Normal range of motion and neck supple. No rigidity.  Lymphadenopathy:     Cervical: No cervical adenopathy.  Skin:    Findings: No erythema or rash.  Neurological:     Cranial Nerves: No cranial nerve deficit.     Motor: No abnormal muscle tone.     Coordination: Coordination normal.     Gait: Gait abnormal.     Deep Tendon Reflexes: Reflexes normal.  Psychiatric:        Behavior: Behavior normal.        Thought Content: Thought content normal.        Judgment: Judgment normal.   Posterior neck and occipital area or tender on the right  Lab Results  Component Value Date   WBC 5.1 06/24/2022   HGB 13.0 06/24/2022   HCT 38.0 06/24/2022   PLT 301.0 06/24/2022   GLUCOSE 85 06/24/2022   CHOL 199 06/24/2022   TRIG 144.0 06/24/2022   HDL 55.00 06/24/2022   LDLCALC 115 (H) 06/24/2022   ALT 17 06/24/2022   AST 23 06/24/2022   NA 139 06/24/2022   K 4.2 06/24/2022   CL 102 06/24/2022   CREATININE 1.26 (H) 06/24/2022   BUN 19 06/24/2022   CO2 29 06/24/2022   TSH 0.83 06/24/2022   INR 1.0 04/10/2021    MR BRAIN WO CONTRAST  Result Date:  05/22/2022  [facility] [address] [place], [place] [address] [phone] NEUROIMAGING REPORT STUDY DATE: 05/22/2022 PATIENT NAME: Mariah Farrell DOB: July 30, 1940 MRN: [id number] EXAM: MRI Brain without contrast ORDERING CLINICIAN: Marcial Pacas MD, PhD CLINICAL HISTORY: 82 year old woman with headache COMPARISON FILMS: 12/01/2010 TECHNIQUE: MRI  of the brain without contrast was obtained utilizing 5 mm axial slices with T1, T2, T2 flair, SWI and diffusion weighted views.  T1 sagittal and T2 coronal views were obtained. CONTRAST: none IMAGING SITE: [facility], [place], [place] FINDINGS: On sagittal images, the spinal cord is imaged caudally to C3 and is normal in caliber.   The contents of the posterior fossa are of normal size and position.   The pituitary gland and optic chiasm appear normal.    There is mild generalized cortical atrophy, normal for age and progressed compared to the 2012 MRI   the ventricles are normal in size and without distortion.  There are no abnormal extra-axial collections of fluid.  The cerebellum and brainstem appears normal.   The deep gray matter appears normal.  There are scattered T2/FLAIR hyperintense foci in the subcortical and deep white matter of both cerebral hemispheres.  None of these appear to be acute.  There is progression compared to the 2012 MRI.  Diffusion weighted images are normal.  Susceptibility weighted images are normal.  The orbits appear normal.   The VIIth/VIIIth nerve complex appears normal.  The mastoid air cells appear normal.  The paranasal sinuses appear normal.  Flow voids are identified within the major intracerebral arteries.     This MRI of the brain without contrast shows the following: Mild generalized cortical atrophy that is normal for age. Scattered T2/FLAIR hyperintense foci in the hemispheres consistent with mild chronic microvascular ischemic change, progressed compared to the 2012 MRI No acute  findings. INTERPRETING PHYSICIAN: Richard A. Sater, MD, PhD, FAAN Certified in  Neuroimaging by [employer]    Assessment & Plan:   Problem List Items Addressed This Visit       Cardiovascular and Mediastinum   Essential hypertension     BP Readings from Last 3 Encounters:  06/24/22 130/80  05/11/22 (!) 167/87  04/08/22 125/64        Relevant Orders   TSH (Completed)   Urinalysis   CBC with Differential/Platelet (Completed)   Lipid panel (Completed)   Comprehensive metabolic panel (Completed)   Vitamin B12 (Completed)   VITAMIN D 25 Hydroxy (Vit-D Deficiency, Fractures) (Completed)     Nervous and Auditory   Cervico-occipital neuralgia - Primary    Blue-Emu cream was recommended to use 2-3 times a day Heat On Cymbalta, diazepam       Relevant Medications   DULoxetine (CYMBALTA) 20 MG capsule   diazepam (VALIUM) 5 MG tablet   celecoxib (CELEBREX) 200 MG capsule     Musculoskeletal and Integument   Osteoarthritis    On Celebrex w/caution      Relevant Medications   celecoxib (CELEBREX) 200 MG capsule   Other Relevant Orders   TSH (Completed)   Urinalysis   CBC with Differential/Platelet (Completed)   Lipid panel (Completed)   Comprehensive metabolic panel (Completed)   Vitamin B12 (Completed)   VITAMIN D 25 Hydroxy (Vit-D Deficiency, Fractures) (Completed)   Osteoporosis    On Miacalcin        Other   Vitamin D deficiency    On Vit D      Relevant Orders   TSH (Completed)   Urinalysis   CBC with Differential/Platelet (Completed)   Lipid panel (Completed)   Comprehensive metabolic panel (Completed)   Vitamin B12 (Completed)   VITAMIN D 25 Hydroxy (Vit-D Deficiency, Fractures) (Completed)   Other Visit Diagnoses     Paresthesia  Relevant Orders   Vitamin B12 (Completed)         Meds ordered this encounter  Medications   DULoxetine (CYMBALTA) 20 MG capsule    Sig: Take 1 capsule (20 mg total) by mouth daily.     Dispense:  90 capsule    Refill:  3   diazepam (VALIUM) 5 MG tablet    Sig: TAKE 1/2 TO 1 TABLET BY MOUTH EVERY 12 HOURS AS NEEDED FOR ANXIETY, INSOMNIA    Dispense:  180 tablet    Refill:  1   DISCONTD: DULoxetine (CYMBALTA) 60 MG capsule    Sig: Take 1 capsule (60 mg total) by mouth daily. Fill cymbalta '20mg'$  first    Dispense:  60 capsule    Refill:  3   celecoxib (CELEBREX) 200 MG capsule    Sig: Take 1 capsule (200 mg total) by mouth daily as needed.    Dispense:  90 capsule    Refill:  1      Follow-up: Return in about 6 months (around 12/23/2022) for a follow-up visit.  Walker Kehr, MD

## 2022-06-30 ENCOUNTER — Other Ambulatory Visit: Payer: Self-pay | Admitting: Orthopaedic Surgery

## 2022-07-01 ENCOUNTER — Other Ambulatory Visit: Payer: Self-pay | Admitting: Orthopaedic Surgery

## 2022-07-09 ENCOUNTER — Other Ambulatory Visit (HOSPITAL_COMMUNITY): Payer: Self-pay

## 2022-07-22 ENCOUNTER — Other Ambulatory Visit: Payer: Self-pay | Admitting: Internal Medicine

## 2022-07-24 ENCOUNTER — Other Ambulatory Visit (HOSPITAL_COMMUNITY): Payer: Self-pay

## 2022-07-28 ENCOUNTER — Other Ambulatory Visit (HOSPITAL_COMMUNITY): Payer: Self-pay

## 2022-08-12 ENCOUNTER — Ambulatory Visit (INDEPENDENT_AMBULATORY_CARE_PROVIDER_SITE_OTHER): Payer: Medicare HMO | Admitting: Neurology

## 2022-08-12 ENCOUNTER — Encounter: Payer: Self-pay | Admitting: Neurology

## 2022-08-12 ENCOUNTER — Other Ambulatory Visit: Payer: Self-pay | Admitting: Internal Medicine

## 2022-08-12 VITALS — BP 125/75 | HR 83 | Ht 60.0 in | Wt 188.0 lb

## 2022-08-12 DIAGNOSIS — M5412 Radiculopathy, cervical region: Secondary | ICD-10-CM | POA: Diagnosis not present

## 2022-08-12 DIAGNOSIS — M5481 Occipital neuralgia: Secondary | ICD-10-CM

## 2022-08-12 MED ORDER — INCOBOTULINUMTOXINA 100 UNITS IM SOLR: 100.0000 [IU] | Freq: Once | INTRAMUSCULAR | Status: DC

## 2022-08-12 MED ORDER — GABAPENTIN 100 MG PO CAPS: 100.0000 mg | ORAL_CAPSULE | Freq: Three times a day (TID) | ORAL | 3 refills | Status: DC | PRN

## 2022-08-12 NOTE — Progress Notes (Signed)
Xeomin 100 units x 2 vials [id number] [id number] Exp-05/2024 SP 

## 2022-08-12 NOTE — Progress Notes (Signed)
Chief Complaint  Patient presents with   Procedure    Rm 14 PT saw some relief with xeomin, states medication wore off 2 weeks ago       Mariah Farrell is a 82 y.o. female   Right-sided neck pain, radiating pain to right occipital region Multilevel cervical degenerative changes, most noticeable on the right side,  She had right C1, C2 nerve block by neurosurgical pain management on April 16, 2022, with limited improvement  Cymbalta 20 mg every day seems to help her some, may titrating to 40 mg,  EMG guided xeomin injection, used 200 units today   Right levator scapular 25 units Right iliocostalis muscle 25 units Right splenius cervix 25 units Right splenius capitis 25 units Right longissimus capitis 25 units Right upper trapezius 25 units  Left levator scapula 25 units Left splenius cervix 25 units  Frequent headaches  Family history of glioblastoma  MRI of brain mild small vessel disease no acute abnormality  Increase water intake, keep moderate exercise,  She is taking frequent Excedrin migraine for neck pain proposed, has enough aspirin aimovig  DIAGNOSTIC DATA (LABS, IMAGING, TESTING) - I reviewed patient records, labs, notes, testing and imaging myself where available.   MEDICAL HISTORY:  Mariah Farrell is a 82 year old female, seen in request by orthopedic surgeon Dr. Basil Farrell for evaluation of right neck pain, she is accompanied by her husband Dr. Eulas Farrell, retired orthopedic surgeon at today's visit on April 08, 2022, PCP Plotnikov, Mariah Lacks, MD   I reviewed and summarized the referring note. PMHx. Chronic insomnia Hypothyrodism GERD Lumbar decompression surgery Bilateral Rotator cuff Bilateral Knee replacement.  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 Mliss Fritz in August 2023, without significant improvement, pending nerve block by neurosurgical pain management of right C1, 2 on April 16, 2022,  She also tried warm compression, NSAIDs as needed, gabapentin with limited help, the constant moderate to severe right neck deep achy pain, with occasionally sharp radiating pain to right occipital parietal region has affected life quality, hope to receive some relief  Update May 11, 2022 She had right C1-C2 facet joint injection with only very temporary relief, constant right upper cervical region deep achy pain, difficulty sleeping, limited her daily activity  She is taking Celebrex 200 mg as needed along with Valium 2.5 mg as needed, gabapentin 300 mg 1 to 2 tablets at nighttime, frequent Aleve,   In addition she complains of intermittent headaches, often triggered by sharp radiating pain from neck,  UPDATE August 12 2022; She has mild improvement with last injection, has transient difficulty raising up her neck, denies significant neck muscle weakness, in the last 2 weeks, has previous injection wearing off, she noticed increased neck pain, radiating to right shoulder  She tolerates Cymbalta 20 mg, which seems to help her symptoms  Personally reviewed MRI of brain in Feb 2024, mild small vessel disease.  PHYSICAL EXAM:   Vitals:   08/12/22 1325  Weight: 188 lb (85.3 kg)  Height: 5' (1.524  m)    MOTOR: Abnormal neck posturing, mild right shoulder elevation, retrocollis, significant tenderness of cervical paraspinal muscles, at  bilateral levator scapula, right upper cervical paraspinal muscles.     GAIT/STANCE: Need push-up to get up from seated position, cautious  REVIEW OF SYSTEMS:  Full 14 system review of systems performed and notable only for as above All other review of systems were negative.   ALLERGIES: Allergies  Allergen Reactions   Doxycycline Hives   Crab [Shellfish Allergy]    Penicillins    Prolia [Denosumab]     Jaw necrosis wound    HOME MEDICATIONS: Current Outpatient Medications  Medication Sig Dispense Refill   Biotin 5000 MCG TABS Take 1 tablet by mouth daily.     calcitonin, salmon, (MIACALCIN/FORTICAL) 200 UNIT/ACT nasal spray USE ONE SPRAY IN ONE NOSTRIL DAILY. ALTERNATE NOSTRILS 11.1 mL 11   calcium-vitamin D (OSCAL WITH D) 500-200 MG-UNIT per tablet Take 1 tablet by mouth daily.     celecoxib (CELEBREX) 200 MG capsule Take 1 capsule (200 mg total) by mouth daily as needed. 90 capsule 1   cetirizine (ZYRTEC) 10 MG tablet Take 10 mg by mouth daily.     Cholecalciferol (VITAMIN D3) 2000 units capsule Take 1 capsule (2,000 Units total) by mouth daily. 100 capsule 3   COVID-19 mRNA vaccine 2023-2024 (COMIRNATY) syringe Inject into the muscle. (Patient not taking: Reported on 06/24/2022) 0.3 mL 0   diazepam (VALIUM) 5 MG tablet TAKE 1/2 TO 1 TABLET BY MOUTH EVERY 12 HOURS AS NEEDED FOR ANXIETY, INSOMNIA 180 tablet 1   diclofenac (CATAFLAM) 50 MG tablet Take 1 tablet (50 mg total) by mouth 2 (two) times daily. 60 tablet 3   DULoxetine (CYMBALTA) 20 MG capsule Take 1 capsule (20 mg total) by mouth daily. 90 capsule 3   furosemide (LASIX) 20 MG tablet TAKE 1 TABLET BY MOUTH DAILY 30 tablet 0   gabapentin (NEURONTIN) 300 MG capsule Take 1 capsule (300 mg total) by mouth at bedtime. 90 capsule 0   glucosamine-chondroitin 500-400 MG tablet Take 1 tablet by mouth daily.     incobotulinumtoxinA (XEOMIN) 100 units SOLR injection Inject 400 Units into the muscle every 3 (three) months. Inject 400 units into the muscle every 90 days. 4 each 0   latanoprost (XALATAN)  0.005 % ophthalmic solution SMARTSIG:In Eye(s)     levothyroxine (SYNTHROID) 25 MCG tablet TAKE ONE TABLET BY MOUTH DAILY BEFORE BREAKFAST 90 tablet 3   Multiple Vitamin (MULTIVITAMIN) tablet Take 1 tablet by mouth daily.     pantoprazole (PROTONIX) 40 MG tablet TAKE ONE TABLET BY MOUTH DAILY 90 tablet 3   vitamin C (ASCORBIC ACID) 500 MG tablet Take 1,000 mg by mouth daily.      Current Facility-Administered Medications  Medication Dose Route Frequency Provider Last Rate Last Admin   incobotulinumtoxinA (XEOMIN) 100 units injection 100 Units  100 Units Intramuscular Once Marcial Pacas, MD        PAST MEDICAL HISTORY: Past Medical History:  Diagnosis Date   Arthritis    Cancer ([place]) 13 years ago   bladder   GERD (gastroesophageal reflux disease)    Goiter    synthroid used to shrink   H/O hiatal hernia    Headache(784.0)    migraines in morning   PONV (postoperative nausea and vomiting)    Seasonal allergies     PAST SURGICAL HISTORY: Past Surgical History:  Procedure Laterality Date   ARTHRODESIS FOOT WITH WEIL OSTEOTOMY Right 07/26/2014  Procedure: RIGHT HALLUX METATARSOPHALANGEAL JOINT ARTHRODESIS; RIGHT SECOND METATARSAL WEIL OSTEOTOMY; DORSAL CAPSULOTOMY EXTENSOR TENDON LENGTHENING;  Surgeon: Wylene Simmer, MD;  Location: [facility];  Service: Orthopedics;  Laterality: Right;   BLADDER SURGERY  13 years ago   BUNIONECTOMY Bilateral    COLONOSCOPY     LUMBAR FUSION  May 2014   L4-L5   ROTATOR CUFF REPAIR Bilateral ~2010, 2012   SPINAL FUSION     L1, L2   TONSILLECTOMY  as child   TOTAL KNEE ARTHROPLASTY Right 07/19/2012   Procedure: TOTAL KNEE ARTHROPLASTY;  Surgeon: Mauri Pole, MD;  Location: [facility];  Service: Orthopedics;  Laterality: Right;   TOTAL KNEE ARTHROPLASTY Left 08/01/2013   Procedure: LEFT TOTAL KNEE ARTHROPLASTY;  Surgeon: Mauri Pole, MD;  Location: [facility];  Service: Orthopedics;  Laterality: Left;    FAMILY HISTORY: Family History   Problem Relation Age of Onset   Cancer Brother 45       pancreatic   Brain cancer Father     SOCIAL HISTORY: Social History   Socioeconomic History   Marital status: Married    Spouse name: Not on file   Number of children: Not on file   Years of education: Not on file   Highest education level: Not on file  Occupational History   Not on file  Tobacco Use   Smoking status: Former    Packs/day: 0.25    Years: 5.00    Total pack years: 1.25    Types: Cigarettes    Quit date: 06/01/1964    Years since quitting: 58.2   Smokeless tobacco: Never  Vaping Use   Vaping Use: Never used  Substance and Sexual Activity   Alcohol use: Yes    Comment: 1-2 times weekly   Drug use: No   Sexual activity: Not on file  Other Topics Concern   Not on file  Social History Narrative   Not on file   Social Determinants of Health   Financial Resource Strain: Low Risk  (10/06/2021)   Overall Financial Resource Strain (CARDIA)    Difficulty of Paying Living Expenses: Not hard at all  Food Insecurity: No Food Insecurity (10/06/2021)   Hunger Vital Sign    Worried About Running Out of Food in the Last Year: Never true    [place] in the Last Year: Never true  Transportation Needs: No Transportation Needs (10/06/2021)   PRAPARE - Hydrologist (Medical): No    Lack of Transportation (Non-Medical): No  Physical Activity: Sufficiently Active (10/06/2021)   Exercise Vital Sign    Days of Exercise per Week: 5 days    Minutes of Exercise per Session: 30 min  Stress: No Stress Concern Present (10/06/2021)   [place]    Feeling of Stress : Not at all  Social Connections: [place] (10/06/2021)   Social Connection and Isolation Panel [NHANES]    Frequency of Communication with Friends and Family: More than three times a week    Frequency of Social Gatherings with Friends and Family: Twice a week     Attends Religious Services: More than 4 times per year    Active Member of [employer] or Organizations: Yes    Attends Archivist Meetings: More than 4 times per year    Marital Status: Married  Human resources officer Violence: Not At Risk (10/06/2021)   Humiliation, Afraid, Rape, and Kick questionnaire  Fear of Current or Ex-Partner: No    Emotionally Abused: No    Physically Abused: No    Sexually Abused: No      Marcial Pacas, M.D. Ph.D.  [facility] [address] [place], [place] [address] Ph: [phone] Fax: [phone]  CC:  Plotnikov, Mariah Lacks, MD [place],  [place] [address]  Plotnikov, Mariah Lacks, MD

## 2022-09-02 DIAGNOSIS — H401131 Primary open-angle glaucoma, bilateral, mild stage: Secondary | ICD-10-CM | POA: Diagnosis not present

## 2022-09-21 ENCOUNTER — Ambulatory Visit (INDEPENDENT_AMBULATORY_CARE_PROVIDER_SITE_OTHER): Payer: Medicare HMO

## 2022-09-21 VITALS — Ht 60.0 in | Wt 188.0 lb

## 2022-09-21 DIAGNOSIS — Z0000 Encounter for general adult medical examination without abnormal findings: Secondary | ICD-10-CM | POA: Diagnosis not present

## 2022-09-21 NOTE — Patient Instructions (Signed)
Mariah Farrell , Thank you for taking time to come for your Medicare Wellness Visit. I appreciate your ongoing commitment to your health goals. Please review the following plan we discussed and let me know if I can assist you in the future.   These are the goals we discussed:  Goals      My goal is to get back to exercising 3 days a week.        This is a list of the screening recommended for you and due dates:  Health Maintenance  Topic Date Due   Flu Shot  12/31/2022   Medicare Annual Wellness Visit  09/21/2023   DTaP/Tdap/Td vaccine (3 - Td or Tdap) 03/04/2026   Pneumonia Vaccine  Completed   DEXA scan (bone density measurement)  Completed   COVID-19 Vaccine  Completed   Zoster (Shingles) Vaccine  Completed   HPV Vaccine  Aged Out    Advanced directives: Yes  Conditions/risks identified: Yes  Next appointment: Follow up in one year for your annual wellness visit.   Preventive Care 45 Years and Older, Female Preventive care refers to lifestyle choices and visits with your health care provider that can promote health and wellness. What does preventive care include? A yearly physical exam. This is also called an annual well check. Dental exams once or twice a year. Routine eye exams. Ask your health care provider how often you should have your eyes checked. Personal lifestyle choices, including: Daily care of your teeth and gums. Regular physical activity. Eating a healthy diet. Avoiding tobacco and drug use. Limiting alcohol use. Practicing safe sex. Taking low-dose aspirin every day. Taking vitamin and mineral supplements as recommended by your health care provider. What happens during an annual well check? The services and screenings done by your health care provider during your annual well check will depend on your age, overall health, lifestyle risk factors, and family history of disease. Counseling  Your health care provider may ask you questions about your: Alcohol  use. Tobacco use. Drug use. Emotional well-being. Home and relationship well-being. Sexual activity. Eating habits. History of falls. Memory and ability to understand (cognition). Work and work Astronomer. Reproductive health. Screening  You may have the following tests or measurements: Height, weight, and BMI. Blood pressure. Lipid and cholesterol levels. These may be checked every 5 years, or more frequently if you are over 68 years old. Skin check. Lung cancer screening. You may have this screening every year starting at age 59 if you have a 30-pack-year history of smoking and currently smoke or have quit within the past 15 years. Fecal occult blood test (FOBT) of the stool. You may have this test every year starting at age 56. Flexible sigmoidoscopy or colonoscopy. You may have a sigmoidoscopy every 5 years or a colonoscopy every 10 years starting at age 92. Hepatitis C blood test. Hepatitis B blood test. Sexually transmitted disease (STD) testing. Diabetes screening. This is done by checking your blood sugar (glucose) after you have not eaten for a while (fasting). You may have this done every 1-3 years. Bone density scan. This is done to screen for osteoporosis. You may have this done starting at age 60. Mammogram. This may be done every 1-2 years. Talk to your health care provider about how often you should have regular mammograms. Talk with your health care provider about your test results, treatment options, and if necessary, the need for more tests. Vaccines  Your health care provider may recommend certain vaccines, such  as: Influenza vaccine. This is recommended every year. Tetanus, diphtheria, and acellular pertussis (Tdap, Td) vaccine. You may need a Td booster every 10 years. Zoster vaccine. You may need this after age 47. Pneumococcal 13-valent conjugate (PCV13) vaccine. One dose is recommended after age 75. Pneumococcal polysaccharide (PPSV23) vaccine. One dose is  recommended after age 56. Talk to your health care provider about which screenings and vaccines you need and how often you need them. This information is not intended to replace advice given to you by your health care provider. Make sure you discuss any questions you have with your health care provider. Document Released: 06/14/2015 Document Revised: 02/05/2016 Document Reviewed: 03/19/2015 Elsevier Interactive Patient Education  2017 [place] Prevention in the Home Falls can cause injuries. They can happen to people of all ages. There are many things you can do to make your home safe and to help prevent falls. What can I do on the outside of my home? Regularly fix the edges of walkways and driveways and fix any cracks. Remove anything that might make you trip as you walk through a door, such as a raised step or threshold. Trim any bushes or trees on the path to your home. Use bright outdoor lighting. Clear any walking paths of anything that might make someone trip, such as rocks or tools. Regularly check to see if handrails are loose or broken. Make sure that both sides of any steps have handrails. Any raised decks and porches should have guardrails on the edges. Have any leaves, snow, or ice cleared regularly. Use sand or salt on walking paths during winter. Clean up any spills in your garage right away. This includes oil or grease spills. What can I do in the bathroom? Use night lights. Install grab bars by the toilet and in the tub and shower. Do not use towel bars as grab bars. Use non-skid mats or decals in the tub or shower. If you need to sit down in the shower, use a plastic, non-slip stool. Keep the floor dry. Clean up any water that spills on the floor as soon as it happens. Remove soap buildup in the tub or shower regularly. Attach bath mats securely with double-sided non-slip rug tape. Do not have throw rugs and other things on the floor that can make you  trip. What can I do in the bedroom? Use night lights. Make sure that you have a light by your bed that is easy to reach. Do not use any sheets or blankets that are too big for your bed. They should not hang down onto the floor. Have a firm chair that has side arms. You can use this for support while you get dressed. Do not have throw rugs and other things on the floor that can make you trip. What can I do in the kitchen? Clean up any spills right away. Avoid walking on wet floors. Keep items that you use a lot in easy-to-reach places. If you need to reach something above you, use a strong step stool that has a grab bar. Keep electrical cords out of the way. Do not use floor polish or wax that makes floors slippery. If you must use wax, use non-skid floor wax. Do not have throw rugs and other things on the floor that can make you trip. What can I do with my stairs? Do not leave any items on the stairs. Make sure that there are handrails on both sides of the stairs and use  them. Fix handrails that are broken or loose. Make sure that handrails are as long as the stairways. Check any carpeting to make sure that it is firmly attached to the stairs. Fix any carpet that is loose or worn. Avoid having throw rugs at the top or bottom of the stairs. If you do have throw rugs, attach them to the floor with carpet tape. Make sure that you have a light switch at the top of the stairs and the bottom of the stairs. If you do not have them, ask someone to add them for you. What else can I do to help prevent falls? Wear shoes that: Do not have high heels. Have rubber bottoms. Are comfortable and fit you well. Are closed at the toe. Do not wear sandals. If you use a stepladder: Make sure that it is fully opened. Do not climb a closed stepladder. Make sure that both sides of the stepladder are locked into place. Ask someone to hold it for you, if possible. Clearly mark and make sure that you can  see: Any grab bars or handrails. First and last steps. Where the edge of each step is. Use tools that help you move around (mobility aids) if they are needed. These include: Canes. Walkers. Scooters. Crutches. Turn on the lights when you go into a dark area. Replace any light bulbs as soon as they burn out. Set up your furniture so you have a clear path. Avoid moving your furniture around. If any of your floors are uneven, fix them. If there are any pets around you, be aware of where they are. Review your medicines with your doctor. Some medicines can make you feel dizzy. This can increase your chance of falling. Ask your doctor what other things that you can do to help prevent falls. This information is not intended to replace advice given to you by your health care provider. Make sure you discuss any questions you have with your health care provider. Document Released: 03/14/2009 Document Revised: 10/24/2015 Document Reviewed: 06/22/2014 Elsevier Interactive Patient Education  2017 [employer].

## 2022-09-21 NOTE — Progress Notes (Addendum)
I connected with  Mariah Farrell on 09/21/22 by a audio enabled telemedicine application and verified that I am speaking with the correct person using two identifiers.  Patient Location: Home  Provider Location: Office/Clinic  I discussed the limitations of evaluation and management by telemedicine. The patient expressed understanding and agreed to proceed.  Patient Medicare AWV questionnaire was completed by the patient on 09/20/2022; I have confirmed that all information answered by patient is correct and no changes since this date.    Subjective:   Mariah Farrell is a 82 y.o. female who presents for Medicare Annual (Subsequent) preventive examination.  Review of Systems     Cardiac Risk Factors include: advanced age (>75men, >91 women);diabetes mellitus;hypertension;obesity (BMI >30kg/m2)     Objective:    Today's Vitals   09/21/22 1513 09/21/22 1515  Weight: 188 lb (85.3 kg)   Height: 5' (1.524 m)   PainSc: 7  7   PainLoc: Head    Body mass index is 36.72 kg/m.     09/21/2022    3:20 PM 10/06/2021    3:38 PM 09/30/2020   11:58 AM 09/26/2020   11:50 AM 09/10/2015    1:33 PM 07/26/2014   10:31 AM 07/23/2014   10:14 AM  Advanced Directives  Does Patient Have a Medical Advance Directive? Yes Yes Yes Yes Yes Yes Yes  Type of Estate agent of [place];Living will Living will;Healthcare Power of Attorney Living will;Healthcare Power of Attorney Living will;Healthcare Power of [employer] Power of [place];Living will  Does patient want to make changes to medical advance directive?  No - Patient declined No - Patient declined No - Patient declined   No - Patient declined  Copy of Healthcare Power of Attorney in Chart? No - copy requested No - copy requested No - copy requested No - copy requested       Current Medications (verified) Outpatient Encounter Medications as of 09/21/2022  Medication Sig   Biotin 5000 MCG TABS Take 1 tablet  by mouth daily.   calcitonin, salmon, (MIACALCIN/FORTICAL) 200 UNIT/ACT nasal spray USE ONE SPRAY IN ONE NOSTRIL DAILY. ALTERNATE NOSTRILS   calcium-vitamin D (OSCAL WITH D) 500-200 MG-UNIT per tablet Take 1 tablet by mouth daily.   celecoxib (CELEBREX) 200 MG capsule Take 1 capsule (200 mg total) by mouth daily as needed.   cetirizine (ZYRTEC) 10 MG tablet Take 10 mg by mouth daily.   Cholecalciferol (VITAMIN D3) 2000 units capsule Take 1 capsule (2,000 Units total) by mouth daily.   COVID-19 mRNA vaccine 2023-2024 (COMIRNATY) syringe Inject into the muscle. (Patient not taking: Reported on 06/24/2022)   diazepam (VALIUM) 5 MG tablet TAKE 1/2 TO 1 TABLET BY MOUTH EVERY 12 HOURS AS NEEDED FOR ANXIETY, INSOMNIA   diclofenac (CATAFLAM) 50 MG tablet Take 1 tablet (50 mg total) by mouth 2 (two) times daily.   DULoxetine (CYMBALTA) 20 MG capsule Take 1 capsule (20 mg total) by mouth daily.   furosemide (LASIX) 20 MG tablet TAKE 1 TABLET BY MOUTH DAILY   gabapentin (NEURONTIN) 100 MG capsule Take 1 capsule (100 mg total) by mouth 3 (three) times daily as needed.   glucosamine-chondroitin 500-400 MG tablet Take 1 tablet by mouth daily.   incobotulinumtoxinA (XEOMIN) 100 units SOLR injection Inject 400 Units into the muscle every 3 (three) months. Inject 400 units into the muscle every 90 days.   latanoprost (XALATAN) 0.005 % ophthalmic solution SMARTSIG:In Eye(s)   levothyroxine (SYNTHROID) 25 MCG tablet TAKE  ONE TABLET BY MOUTH DAILY BEFORE BREAKFAST   Multiple Vitamin (MULTIVITAMIN) tablet Take 1 tablet by mouth daily.   pantoprazole (PROTONIX) 40 MG tablet TAKE ONE TABLET BY MOUTH DAILY   vitamin C (ASCORBIC ACID) 500 MG tablet Take 1,000 mg by mouth daily.    Facility-Administered Encounter Medications as of 09/21/2022  Medication   incobotulinumtoxinA (XEOMIN) 100 units injection 100 Units    Allergies (verified) Doxycycline, Crab [shellfish allergy], Penicillins, and Prolia [denosumab]    History: Past Medical History:  Diagnosis Date   Arthritis    Cancer 13 years ago   bladder   GERD (gastroesophageal reflux disease)    Goiter    synthroid used to shrink   H/O hiatal hernia    Headache(784.0)    migraines in morning   PONV (postoperative nausea and vomiting)    Seasonal allergies    Past Surgical History:  Procedure Laterality Date   ARTHRODESIS FOOT WITH WEIL OSTEOTOMY Right 07/26/2014   Procedure: RIGHT HALLUX METATARSOPHALANGEAL JOINT ARTHRODESIS; RIGHT SECOND METATARSAL WEIL OSTEOTOMY; DORSAL CAPSULOTOMY EXTENSOR TENDON LENGTHENING;  Surgeon: Toni Arthurs, MD;  Location: [facility];  Service: Orthopedics;  Laterality: Right;   BLADDER SURGERY  13 years ago   BUNIONECTOMY Bilateral    COLONOSCOPY     LUMBAR FUSION  May 2014   L4-L5   ROTATOR CUFF REPAIR Bilateral ~2010, 2012   SPINAL FUSION     L1, L2   TONSILLECTOMY  as child   TOTAL KNEE ARTHROPLASTY Right 07/19/2012   Procedure: TOTAL KNEE ARTHROPLASTY;  Surgeon: Shelda Pal, MD;  Location: [facility];  Service: Orthopedics;  Laterality: Right;   TOTAL KNEE ARTHROPLASTY Left 08/01/2013   Procedure: LEFT TOTAL KNEE ARTHROPLASTY;  Surgeon: Shelda Pal, MD;  Location: [facility];  Service: Orthopedics;  Laterality: Left;   Family History  Problem Relation Age of Onset   Cancer Brother 96       pancreatic   Brain cancer Father    Social History   Socioeconomic History   Marital status: Married    Spouse name: Not on file   Number of children: Not on file   Years of education: Not on file   Highest education level: Not on file  Occupational History   Not on file  Tobacco Use   Smoking status: Former    Packs/day: 0.25    Years: 5.00    Additional pack years: 0.00    Total pack years: 1.25    Types: Cigarettes    Quit date: 06/01/1964    Years since quitting: 58.3   Smokeless tobacco: Never  Vaping Use   Vaping Use: Never used  Substance and Sexual Activity   Alcohol use: Yes     Comment: 1-2 times weekly   Drug use: No   Sexual activity: Not on file  Other Topics Concern   Not on file  Social History Narrative   Not on file   Social Determinants of Health   Financial Resource Strain: Low Risk  (09/21/2022)   Overall Financial Resource Strain (CARDIA)    Difficulty of Paying Living Expenses: Not hard at all  Food Insecurity: No Food Insecurity (09/21/2022)   Hunger Vital Sign    Worried About Running Out of Food in the Last Year: Never true    Ran Out of Food in the Last Year: Never true  Transportation Needs: No Transportation Needs (09/21/2022)   PRAPARE - Administrator, Civil Service (Medical): No  Lack of Transportation (Non-Medical): No  Physical Activity: Insufficiently Active (09/21/2022)   Exercise Vital Sign    Days of Exercise per Week: 3 days    Minutes of Exercise per Session: 40 min  Stress: No Stress Concern Present (09/21/2022)   [employer] of Occupational Health - Occupational Stress Questionnaire    Feeling of Stress : Not at all  Social Connections: Socially Integrated (09/21/2022)   Social Connection and Isolation Panel [NHANES]    Frequency of Communication with Friends and Family: Three times a week    Frequency of Social Gatherings with Friends and Family: More than three times a week    Attends Religious Services: More than 4 times per year    Active Member of Clubs or Organizations: Yes    Attends Engineer, structural: More than 4 times per year    Marital Status: Married    Tobacco Counseling Counseling given: Not Answered   Clinical Intake:  Pre-visit preparation completed: Yes  Pain : 0-10 Pain Score: 7  Pain Type: Chronic pain Pain Location: Head     BMI - recorded: 36.72 Nutritional Status: BMI > 30  Obese Nutritional Risks: None Diabetes: Yes CBG done?: No Did pt. bring in CBG monitor from home?: No  How often do you need to have someone help you when you read instructions,  pamphlets, or other written materials from your doctor or pharmacy?: 1 - Never  Nutrition Risk Assessment:  Has the patient had any N/V/D within the last 2 months?  No  Does the patient have any non-healing wounds?  No  Has the patient had any unintentional weight loss or weight gain?  No   Diabetes:  Is the patient diabetic?  Yes  If diabetic, was a CBG obtained today?  No  Did the patient bring in their glucometer from home?  No  How often do you monitor your CBG's? None.   Financial Strains and Diabetes Management:  Are you having any financial strains with the device, your supplies or your medication? No .  Does the patient want to be seen by Chronic Care Management for management of their diabetes?  No  Would the patient like to be referred to a Nutritionist or for Diabetic Management?  No   Diabetic Exams:  Diabetic Eye Exam: Completed 03/31/2022 with Maris Berger, MD.  Diabetic Foot Exam: Overdue, Pt has been advised about the importance in completing this exam. Pt is scheduled for diabetic foot exam on next appointment with PCP.   Interpreter Needed?: No  Information entered by :: Mariah Starliper N. Michale Weikel, LPN.   Activities of Daily Living    09/21/2022    3:21 PM 09/20/2022    4:39 PM  In your present state of health, do you have any difficulty performing the following activities:  Hearing? 0 0  Vision? 0 0  Difficulty concentrating or making decisions? 0 0  Walking or climbing stairs? 0 0  Dressing or bathing? 0 0  Doing errands, shopping? 0 0  Preparing Food and eating ? N N  Using the Toilet? N N  In the past six months, have you accidently leaked urine? Y Y  Do you have problems with loss of bowel control? N N  Managing your Medications? N N  Managing your Finances? N N  Housekeeping or managing your Housekeeping? N N    Patient Care Team: Plotnikov, Georgina Quint, MD as PCP - General Durene Romans, MD (Orthopedic Surgery) Tracey Harries, MD (Obstetrics  and  Gynecology) Sharrell Ku, MD (Gastroenterology) Dominica Severin, MD (Orthopedic Surgery) Maris Berger, MD as Consulting Physician (Ophthalmology) Omar Person, MD as Consulting Physician (Pulmonary Disease) Omar Person, MD as Consulting Physician (Pulmonary Disease)  Indicate any recent Medical Services you may have received from other than [facility] providers in the past year (date may be approximate).     Assessment:   This is a routine wellness examination for Mariah Farrell.  Hearing/Vision screen Hearing Screening - Comments:: Patient wears hearing aids. Vision Screening - Comments:: Wears rx glasses - up to date with routine eye exams with Maris Berger, MD.   Dietary issues and exercise activities discussed: Current Exercise Habits: Home exercise routine, Type of exercise: walking, Time (Minutes): 40, Frequency (Times/Week): 3, Weekly Exercise (Minutes/Week): 120, Intensity: Moderate, Exercise limited by: orthopedic condition(s);cardiac condition(s);respiratory conditions(s)   Goals Addressed             This Visit's Progress    My goal is to get back to exercising 3 days a week.        Depression Screen    09/21/2022    3:18 PM 06/24/2022    9:05 AM 10/06/2021    3:36 PM 06/23/2021    9:04 AM 09/26/2020   11:43 AM 06/20/2020    8:23 AM 01/04/2020    4:40 PM  PHQ 2/9 Scores  PHQ - 2 Score 0 0 0 0 0 0 0  PHQ- 9 Score 0   0       Fall Risk    09/21/2022    3:21 PM 09/20/2022    4:39 PM 06/24/2022    9:05 AM 10/06/2021    3:42 PM 06/23/2021    9:03 AM  Fall Risk   Falls in the past year? 0 0 0 1 1  Number falls in past yr: 0 0 0 0 0  Injury with Fall? 0 0 0 0 1  Comment     pt states she fell off a stool  Risk for fall due to : No Fall Risks  No Fall Risks  History of fall(s);Impaired mobility  Follow up Falls prevention discussed  Falls evaluation completed Falls prevention discussed     FALL RISK PREVENTION PERTAINING TO THE HOME:  Any stairs in  or around the home? Yes  If so, are there any without handrails? No  Home free of loose throw rugs in walkways, pet beds, electrical cords, etc? Yes  Adequate lighting in your home to reduce risk of falls? Yes   ASSISTIVE DEVICES UTILIZED TO PREVENT FALLS:  Life alert? No  Use of a cane, walker or w/c? No  Grab bars in the bathroom? Yes  Shower chair or bench in shower? No  Elevated toilet seat or a handicapped toilet? Yes   TIMED UP AND GO:  Was the test performed? No . Telephonic Visit   Cognitive Function:        09/21/2022    3:24 PM 10/06/2021    3:44 PM  6CIT Screen  What Year? 0 points 0 points  What month? 0 points 0 points  What time? 0 points 0 points  Count back from 20 0 points 0 points  Months in reverse 0 points 0 points  Repeat phrase 0 points 0 points  Total Score 0 points 0 points    Immunizations Immunization History  Administered Date(s) Administered   COVID-19, mRNA, vaccine(Comirnaty)12 years and older 03/27/2022   Fluad Quad(high Dose 65+) 03/01/2020, 03/29/2021   H1N1 05/07/2008  Influenza Split 04/01/2012   Influenza Whole 03/04/2010   Influenza, High Dose Seasonal PF 03/04/2016, 03/10/2017, 02/22/2018, 01/27/2019   Influenza,inj,Quad PF,6+ Mos 02/20/2014   Influenza,inj,quad, With Preservative 03/01/2017   Influenza-Unspecified 03/09/2022   PFIZER Comirnaty(Gray Top)Covid-19 Tri-Sucrose Vaccine 09/18/2020   PFIZER(Purple Top)SARS-COV-2 Vaccination 06/26/2019, 07/17/2019, 02/10/2020   Pfizer Covid-19 Vaccine Bivalent Booster 44yrs & up 03/11/2021   Pneumococcal Conjugate-13 12/18/2013   Pneumococcal Polysaccharide-23 03/11/2006, 04/03/2019   Respiratory Syncytial Virus Vaccine,Recomb Aduvanted(Arexvy) 03/09/2022   Td 02/11/2006   Tdap 03/04/2016   Zoster Recombinat (Shingrix) 02/22/2018, 04/25/2018   Zoster, Live 11/07/2008    TDAP status: Up to date  Flu Vaccine status: Up to date  Pneumococcal vaccine status: Up to  date  Covid-19 vaccine status: Completed vaccines  Qualifies for Shingles Vaccine? Yes   Zostavax completed Yes   Shingrix Completed?: Yes  Screening Tests Health Maintenance  Topic Date Due   INFLUENZA VACCINE  12/31/2022   Medicare Annual Wellness (AWV)  09/21/2023   DTaP/Tdap/Td (3 - Td or Tdap) 03/04/2026   Pneumonia Vaccine 45+ Years old  Completed   DEXA SCAN  Completed   COVID-19 Vaccine  Completed   Zoster Vaccines- Shingrix  Completed   HPV VACCINES  Aged Out    Health Maintenance  There are no preventive care reminders to display for this patient.   Colorectal cancer screening: Type of screening: FOBT/FIT. Completed 06/03/2021. Repeat every 1 years  Mammogram status: Completed 12/2021. Repeat every year  Bone Density status: Completed 06/16/2022. Results reflect: Bone density results: OSTEOPENIA. Repeat every 2-3 years.  Lung Cancer Screening: (Low Dose CT Chest recommended if Age 106-80 years, 30 pack-year currently smoking OR have quit w/in 15years.) does not qualify.   Lung Cancer Screening Referral: No  Additional Screening:  Hepatitis C Screening: does not qualify; Completed: No  Vision Screening: Recommended annual ophthalmology exams for early detection of glaucoma and other disorders of the eye. Is the patient up to date with their annual eye exam?  Yes  Who is the provider or what is the name of the office in which the patient attends annual eye exams? Maris Berger, MD. If pt is not established with a provider, would they like to be referred to a provider to establish care? No .   Dental Screening: Recommended annual dental exams for proper oral hygiene  Community Resource Referral / Chronic Care Management: CRR required this visit?  No   CCM required this visit?  No      Plan:     I have personally reviewed and noted the following in the patient's chart:   Medical and social history Use of alcohol, tobacco or illicit drugs  Current  medications and supplements including opioid prescriptions. Patient is not currently taking opioid prescriptions. Functional ability and status Nutritional status Physical activity Advanced directives List of other physicians Hospitalizations, surgeries, and ER visits in previous 12 months Vitals Screenings to include cognitive, depression, and falls Referrals and appointments  In addition, I have reviewed and discussed with patient certain preventive protocols, quality metrics, and best practice recommendations. A written personalized care plan for preventive services as well as general preventive health recommendations were provided to patient.     Mickeal Needy, LPN   02/13/7828   Nurse Notes: Normal cognitive status assessed by direct observation via phone by this Nurse Health Advisor. No abnormalities found.   Patient Medicare AWV questionnaire was completed by the patient on 09/20/2022; I have confirmed that all information answered by  patient is correct and no changes since this date.     Medical screening examination/treatment/procedure(s) were performed by non-physician practitioner and as supervising physician I was immediately available for consultation/collaboration.  I agree with above. Jacinta Shoe, MD

## 2022-09-22 ENCOUNTER — Telehealth: Payer: Self-pay | Admitting: Neurology

## 2022-09-22 NOTE — Telephone Encounter (Signed)
Gaya from [employer] called needing to speak to the RN regarding a interaction between the DULoxetine (CYMBALTA) 20 MG capsule and her Gabapentin. She states that they are the same type of therapy and she is needing to document for the insurance company. Please advise.

## 2022-09-22 NOTE — Telephone Encounter (Signed)
Ok to take both, make patient aware, polypharmacy does increase chance of medication side effect, such as dizziness, unsteady gait, but she is on low dose of both, should be ok

## 2022-09-23 NOTE — Telephone Encounter (Signed)
Called and spoke to pharmacy and relayed Dr. Mahalia Longest that the pt may take both gabapentin and cymbalta.

## 2022-10-02 DIAGNOSIS — H2513 Age-related nuclear cataract, bilateral: Secondary | ICD-10-CM | POA: Diagnosis not present

## 2022-10-02 DIAGNOSIS — H401132 Primary open-angle glaucoma, bilateral, moderate stage: Secondary | ICD-10-CM | POA: Diagnosis not present

## 2022-10-09 DIAGNOSIS — M5136 Other intervertebral disc degeneration, lumbar region: Secondary | ICD-10-CM | POA: Diagnosis not present

## 2022-10-09 DIAGNOSIS — M25551 Pain in right hip: Secondary | ICD-10-CM | POA: Diagnosis not present

## 2022-10-09 DIAGNOSIS — S39012A Strain of muscle, fascia and tendon of lower back, initial encounter: Secondary | ICD-10-CM | POA: Diagnosis not present

## 2022-10-12 DIAGNOSIS — Z961 Presence of intraocular lens: Secondary | ICD-10-CM | POA: Diagnosis not present

## 2022-10-12 DIAGNOSIS — H25812 Combined forms of age-related cataract, left eye: Secondary | ICD-10-CM | POA: Diagnosis not present

## 2022-10-12 DIAGNOSIS — H2512 Age-related nuclear cataract, left eye: Secondary | ICD-10-CM | POA: Diagnosis not present

## 2022-10-20 DIAGNOSIS — Z85828 Personal history of other malignant neoplasm of skin: Secondary | ICD-10-CM | POA: Diagnosis not present

## 2022-10-20 DIAGNOSIS — L821 Other seborrheic keratosis: Secondary | ICD-10-CM | POA: Diagnosis not present

## 2022-10-20 DIAGNOSIS — D1801 Hemangioma of skin and subcutaneous tissue: Secondary | ICD-10-CM | POA: Diagnosis not present

## 2022-10-20 DIAGNOSIS — D2262 Melanocytic nevi of left upper limb, including shoulder: Secondary | ICD-10-CM | POA: Diagnosis not present

## 2022-10-20 DIAGNOSIS — L820 Inflamed seborrheic keratosis: Secondary | ICD-10-CM | POA: Diagnosis not present

## 2022-10-20 DIAGNOSIS — L570 Actinic keratosis: Secondary | ICD-10-CM | POA: Diagnosis not present

## 2022-10-20 DIAGNOSIS — D2271 Melanocytic nevi of right lower limb, including hip: Secondary | ICD-10-CM | POA: Diagnosis not present

## 2022-10-20 DIAGNOSIS — L720 Epidermal cyst: Secondary | ICD-10-CM | POA: Diagnosis not present

## 2022-10-20 DIAGNOSIS — D485 Neoplasm of uncertain behavior of skin: Secondary | ICD-10-CM | POA: Diagnosis not present

## 2022-10-24 ENCOUNTER — Other Ambulatory Visit: Payer: Self-pay | Admitting: Internal Medicine

## 2022-10-27 ENCOUNTER — Other Ambulatory Visit: Payer: Self-pay | Admitting: Internal Medicine

## 2022-10-28 ENCOUNTER — Telehealth: Payer: Self-pay

## 2022-10-28 NOTE — Telephone Encounter (Signed)
Called patients husband to schedule AWV- while on the phone with him, patient asked that I send a request to Dr. Posey Rea to refill her gabapentin and generic Cymbalta. Advised Mrs. Parham I would send a message to clinical staff.

## 2022-10-29 MED ORDER — GABAPENTIN 100 MG PO CAPS: 100.0000 mg | ORAL_CAPSULE | Freq: Three times a day (TID) | ORAL | 3 refills | Status: AC | PRN

## 2022-10-29 NOTE — Telephone Encounter (Signed)
Notified pt refills on the Cymbalta need to contact pharmacy. Sent refill on Gabapentin.Mariah KitchenRaechel Chute

## 2022-12-07 ENCOUNTER — Ambulatory Visit (INDEPENDENT_AMBULATORY_CARE_PROVIDER_SITE_OTHER): Payer: Medicare HMO | Admitting: Neurology

## 2022-12-07 ENCOUNTER — Telehealth: Payer: Self-pay | Admitting: Neurology

## 2022-12-07 VITALS — BP 117/73

## 2022-12-07 DIAGNOSIS — M542 Cervicalgia: Secondary | ICD-10-CM | POA: Insufficient documentation

## 2022-12-07 DIAGNOSIS — R490 Dysphonia: Secondary | ICD-10-CM

## 2022-12-07 DIAGNOSIS — G243 Spasmodic torticollis: Secondary | ICD-10-CM | POA: Insufficient documentation

## 2022-12-07 DIAGNOSIS — M5412 Radiculopathy, cervical region: Secondary | ICD-10-CM | POA: Diagnosis not present

## 2022-12-07 MED ORDER — DULOXETINE HCL 60 MG PO CPEP: 60.0000 mg | ORAL_CAPSULE | Freq: Every day | ORAL | 3 refills | Status: AC

## 2022-12-07 MED ORDER — INCOBOTULINUMTOXINA 100 UNITS IM SOLR: 200.0000 [IU] | INTRAMUSCULAR | Status: DC

## 2022-12-07 NOTE — Telephone Encounter (Signed)
Please only prepare 100 units of Xeomin for next injection, I will talk with her first, depend on her responses, may use 200 units after conversation

## 2022-12-07 NOTE — Progress Notes (Signed)
xenomin 100 units x 2 vial  [id number] [phone] Exp-05-2024 s/p Bacteriostatic 0.9% Sodium Chloride- 4 mL  WGN:[id number] Expiration: 04/2024 NDC: 8657-8469-62 Dx: M54.12 WITNESSED BY:HF

## 2022-12-07 NOTE — Progress Notes (Signed)
Chief Complaint  Patient presents with   Procedure    Rm14, pt is well, husband present and ready for xeomin injection      ASSESSMENT AND PLAN  Mariah Farrell is a 82 y.o. female   Right-sided neck pain, radiating pain to right occipital region Multilevel cervical degenerative changes, most noticeable on the right side, Cervical dystonia Abnormal neck posturing, mild retrocollis, right tilt, mild right shoulder elevation, significant tenderness muscle spasm and right cervical paraspinal muscles,  She had right C1, C2 nerve block by neurosurgical pain management on April 16, 2022, with limited improvement  Higher dose of Cymbalta 40 mg helped her symptoms better, no significant side effect, further increase to 60 mg daily,  EMG guided xeomin injection, used 200 units today   Right levator scapular 25 units Right iliocostalis muscle 25 units Right splenius cervix 25 unitsx2=50 units Right splenius capitis 25 units Right longissimus capitis 25 units    Left levator scapula 25 units Left splenius cervix 25 units    DIAGNOSTIC DATA (LABS, IMAGING, TESTING) - I reviewed patient records, labs, notes, testing and imaging myself where available.   MEDICAL HISTORY:  Mariah Farrell is a 82 year old female, seen in request by orthopedic surgeon Dr. Vira Browns for evaluation of right neck pain, she is accompanied by her husband Dr. Montez Morita, retired orthopedic surgeon at today's visit on April 08, 2022, PCP Plotnikov, Georgina Quint, MD   I reviewed and summarized the referring note. PMHx. Chronic insomnia Hypothyrodism GERD Lumbar decompression surgery Bilateral Rotator cuff Bilateral Knee replacement.  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 Algis Greenhouse in August 2023, without significant improvement, pending nerve block by neurosurgical pain management of right C1, 2 on April 16, 2022,  She also tried warm compression, NSAIDs as needed, gabapentin with limited help, the constant moderate to severe right neck deep achy pain, with occasionally sharp radiating pain to right occipital parietal region has affected life quality, hope to receive some relief  Update May 11, 2022 She had right C1-C2 facet joint injection with only very temporary relief, constant right upper cervical region deep achy pain, difficulty sleeping, limited her daily activity  She is taking Celebrex 200 mg as needed along with Valium 2.5 mg as needed, gabapentin 300 mg 1 to 2 tablets at nighttime, frequent Aleve,   In addition she complains of intermittent headaches, often triggered by sharp radiating pain from neck,  UPDATE August 12 2022; She has mild improvement with last injection, has transient difficulty raising up her neck, denies significant neck muscle weakness, in the last 2 weeks, has previous injection wearing off, she noticed increased neck pain, radiating to right shoulder  She tolerates Cymbalta 20 mg, which seems to help her symptoms  Personally reviewed MRI of brain in Feb 2024, mild small vessel disease.  UPDATE July 8th 2024: She tolerated higher dose of Cymbalta 40 mg, does help her neck pain, will further increase to 60 mg daily, last injection using Xeomin 200 units has really helped her right-sided neck pain, headache  improved as well,  PHYSICAL EXAM:   Today's Vitals   12/07/22 1300  BP: 117/73   There is no height or weight on file to calculate BMI.   MOTOR: Abnormal neck posturing, mild right shoulder elevation, retrocollis, significant tenderness of cervical paraspinal muscles, at  bilateral levator scapula, right upper cervical paraspinal muscles.    GAIT/STANCE: Need push-up to get up from seated position, cautious  REVIEW OF SYSTEMS:  Full 14 system review of systems performed and notable only for as above All other review of systems were negative.   ALLERGIES: Allergies  Allergen Reactions   Doxycycline Hives   Crab [Shellfish Allergy]    Penicillins    Prolia [Denosumab]     Jaw necrosis wound    HOME MEDICATIONS: Current Outpatient Medications  Medication Sig Dispense Refill   Biotin 5000 MCG TABS Take 1 tablet by mouth daily.     calcitonin, salmon, (MIACALCIN/FORTICAL) 200 UNIT/ACT nasal spray USE ONE SPRAY IN ONE NOSTRIL DAILY. ALTERNATE NOSTRILS 11.1 mL 11   calcium-vitamin D (OSCAL WITH D) 500-200 MG-UNIT per tablet Take 1 tablet by mouth daily.     celecoxib (CELEBREX) 200 MG capsule Take 1 capsule (200 mg total) by mouth daily as needed. 90 capsule 1   cetirizine (ZYRTEC) 10 MG tablet Take 10 mg by mouth daily.     Cholecalciferol (VITAMIN D3) 2000 units capsule Take 1 capsule (2,000 Units total) by mouth daily. 100 capsule 3   COVID-19 mRNA vaccine 2023-2024 (COMIRNATY) syringe Inject into the muscle. (Patient not taking: Reported on 06/24/2022) 0.3 mL 0   diazepam (VALIUM) 5 MG tablet TAKE 1/2 TO 1 TABLET BY MOUTH EVERY 12 HOURS AS NEEDED FOR ANXIETY, INSOMNIA 180 tablet 1   diclofenac (CATAFLAM) 50 MG tablet Take 1 tablet (50 mg total) by mouth 2 (two) times daily. 60 tablet 3   DULoxetine (CYMBALTA) 20 MG capsule Take 1 capsule (20 mg total) by mouth daily. 90 capsule 3   furosemide (LASIX) 20 MG tablet TAKE 1 TABLET BY MOUTH DAILY 30 tablet 0   gabapentin (NEURONTIN) 100 MG capsule Take 1 capsule (100 mg total) by mouth 3 (three) times daily as needed. 90 capsule 3   glucosamine-chondroitin 500-400 MG tablet  Take 1 tablet by mouth daily.     incobotulinumtoxinA (XEOMIN) 100 units SOLR injection Inject 400 Units into the muscle every 3 (three) months. Inject 400 units into the muscle every 90 days. 4 each 0   latanoprost (XALATAN) 0.005 % ophthalmic solution SMARTSIG:In Eye(s)     levothyroxine (SYNTHROID) 25 MCG tablet TAKE ONE TABLET BY MOUTH DAILY BEFORE BREAKFAST 90 tablet 3   Multiple Vitamin (MULTIVITAMIN) tablet Take 1 tablet by mouth daily.     pantoprazole (PROTONIX) 40 MG tablet TAKE ONE TABLET BY MOUTH DAILY 90 tablet 3   vitamin C (ASCORBIC ACID) 500 MG tablet Take 1,000 mg by mouth daily.      Current Facility-Administered Medications  Medication Dose Route Frequency Provider Last Rate Last Admin   incobotulinumtoxinA (XEOMIN) 100 units injection 100 Units  100 Units Intramuscular Once Levert Feinstein, MD       incobotulinumtoxinA (XEOMIN) 100 units injection 200 Units  200 Units Intramuscular Q90 days Levert Feinstein, MD        PAST MEDICAL HISTORY: Past Medical History:  Diagnosis Date   Arthritis    Cancer (HCC) 13 years ago   bladder   GERD (gastroesophageal reflux disease)    Goiter    synthroid used to  shrink   H/O hiatal hernia    Headache(784.0)    migraines in morning   PONV (postoperative nausea and vomiting)    Seasonal allergies     PAST SURGICAL HISTORY: Past Surgical History:  Procedure Laterality Date   ARTHRODESIS FOOT WITH WEIL OSTEOTOMY Right 07/26/2014   Procedure: RIGHT HALLUX METATARSOPHALANGEAL JOINT ARTHRODESIS; RIGHT SECOND METATARSAL WEIL OSTEOTOMY; DORSAL CAPSULOTOMY EXTENSOR TENDON LENGTHENING;  Surgeon: Toni Arthurs, MD;  Location: [facility];  Service: Orthopedics;  Laterality: Right;   BLADDER SURGERY  13 years ago   BUNIONECTOMY Bilateral    COLONOSCOPY     LUMBAR FUSION  May 2014   L4-L5   ROTATOR CUFF REPAIR Bilateral ~2010, 2012   SPINAL FUSION     L1, L2   TONSILLECTOMY  as child   TOTAL KNEE ARTHROPLASTY Right 07/19/2012    Procedure: TOTAL KNEE ARTHROPLASTY;  Surgeon: Shelda Pal, MD;  Location: [facility];  Service: Orthopedics;  Laterality: Right;   TOTAL KNEE ARTHROPLASTY Left 08/01/2013   Procedure: LEFT TOTAL KNEE ARTHROPLASTY;  Surgeon: Shelda Pal, MD;  Location: [facility];  Service: Orthopedics;  Laterality: Left;    FAMILY HISTORY: Family History  Problem Relation Age of Onset   Cancer Brother 53       pancreatic   Brain cancer Father     SOCIAL HISTORY: Social History   Socioeconomic History   Marital status: Married    Spouse name: Not on file   Number of children: Not on file   Years of education: Not on file   Highest education level: Not on file  Occupational History   Not on file  Tobacco Use   Smoking status: Former    Packs/day: 0.25    Years: 5.00    Additional pack years: 0.00    Total pack years: 1.25    Types: Cigarettes    Quit date: 06/01/1964    Years since quitting: 58.5   Smokeless tobacco: Never  Vaping Use   Vaping Use: Never used  Substance and Sexual Activity   Alcohol use: Yes    Comment: 1-2 times weekly   Drug use: No   Sexual activity: Not on file  Other Topics Concern   Not on file  Social History Narrative   Not on file   Social Determinants of Health   Financial Resource Strain: Low Risk  (09/21/2022)   Overall Financial Resource Strain (CARDIA)    Difficulty of Paying Living Expenses: Not hard at all  Food Insecurity: No Food Insecurity (09/21/2022)   Hunger Vital Sign    Worried About Running Out of Food in the Last Year: Never true    Ran Out of Food in the Last Year: Never true  Transportation Needs: No Transportation Needs (09/21/2022)   PRAPARE - Administrator, Civil Service (Medical): No    Lack of Transportation (Non-Medical): No  Physical Activity: Insufficiently Active (09/21/2022)   Exercise Vital Sign    Days of Exercise per Week: 3 days    Minutes of Exercise per Session: 40 min  Stress: No Stress Concern Present  (09/21/2022)   [employer] of Occupational Health - Occupational Stress Questionnaire    Feeling of Stress : Not at all  Social Connections: Unknown (09/21/2022)   Social Connection and Isolation Panel [NHANES]    Frequency of Communication with Friends and Family: Three times a week    Frequency of Social Gatherings with Friends and Family: More than three times  a week    Attends Religious Services: Not on file    Active Member of Clubs or Organizations: Yes    Attends Club or Organization Meetings: More than 4 times per year    Marital Status: Married  Intimate Partner Violence: Not At Risk (09/21/2022)   Humiliation, Afraid, Rape, and Kick questionnaire    Fear of Current or Ex-Partner: No    Emotionally Abused: No    Physically Abused: No    Sexually Abused: No      Levert Feinstein, M.D. Ph.D.  [facility] [address] [place], [place] [address] Ph: [phone] Fax: [phone]  CC:  Plotnikov, Georgina Quint, MD [address] [place],  [place] [address]  Plotnikov, Georgina Quint, MD

## 2022-12-28 DIAGNOSIS — D485 Neoplasm of uncertain behavior of skin: Secondary | ICD-10-CM | POA: Diagnosis not present

## 2023-01-17 ENCOUNTER — Other Ambulatory Visit: Payer: Self-pay | Admitting: Internal Medicine

## 2023-01-19 ENCOUNTER — Other Ambulatory Visit (HOSPITAL_COMMUNITY): Payer: Self-pay

## 2023-01-20 ENCOUNTER — Other Ambulatory Visit (HOSPITAL_COMMUNITY): Payer: Self-pay

## 2023-01-21 DIAGNOSIS — M545 Low back pain, unspecified: Secondary | ICD-10-CM | POA: Diagnosis not present

## 2023-01-22 ENCOUNTER — Telehealth: Payer: Self-pay | Admitting: Internal Medicine

## 2023-01-22 MED ORDER — NITROFURANTOIN MONOHYD MACRO 100 MG PO CAPS: 100.0000 mg | ORAL_CAPSULE | Freq: Two times a day (BID) | ORAL | 0 refills | Status: DC

## 2023-01-22 NOTE — Telephone Encounter (Signed)
C/o UTI sx's Macrobid x 5 d Rx sent to [facility]

## 2023-01-25 ENCOUNTER — Ambulatory Visit: Payer: Medicare HMO | Admitting: Family Medicine

## 2023-02-03 DIAGNOSIS — Z1231 Encounter for screening mammogram for malignant neoplasm of breast: Secondary | ICD-10-CM | POA: Diagnosis not present

## 2023-02-03 LAB — HM MAMMOGRAPHY

## 2023-02-05 ENCOUNTER — Encounter: Payer: Self-pay | Admitting: Internal Medicine

## 2023-02-08 DIAGNOSIS — M13849 Other specified arthritis, unspecified hand: Secondary | ICD-10-CM | POA: Diagnosis not present

## 2023-02-08 DIAGNOSIS — R52 Pain, unspecified: Secondary | ICD-10-CM | POA: Diagnosis not present

## 2023-02-08 DIAGNOSIS — M13841 Other specified arthritis, right hand: Secondary | ICD-10-CM | POA: Diagnosis not present

## 2023-02-08 DIAGNOSIS — M79644 Pain in right finger(s): Secondary | ICD-10-CM | POA: Diagnosis not present

## 2023-02-09 ENCOUNTER — Other Ambulatory Visit: Payer: Self-pay

## 2023-02-09 DIAGNOSIS — R52 Pain, unspecified: Secondary | ICD-10-CM | POA: Insufficient documentation

## 2023-02-11 DIAGNOSIS — M47816 Spondylosis without myelopathy or radiculopathy, lumbar region: Secondary | ICD-10-CM | POA: Diagnosis not present

## 2023-02-23 ENCOUNTER — Other Ambulatory Visit: Payer: Self-pay

## 2023-02-23 ENCOUNTER — Other Ambulatory Visit (HOSPITAL_COMMUNITY): Payer: Self-pay

## 2023-02-23 MED ORDER — XEOMIN 100 UNITS IM SOLR
400.0000 [IU] | Freq: Once | INTRAMUSCULAR | 0 refills | Status: DC
  Filled 2023-02-23: qty 4, 1d supply, fill #0
  Filled 2023-02-24: qty 4, 90d supply, fill #0

## 2023-02-23 NOTE — Telephone Encounter (Signed)
[facility] is needing a new prescription sent over for Xeomin, thank you!

## 2023-02-23 NOTE — Addendum Note (Signed)
Addended by: Deatra James on: 02/23/2023 02:26 PM   Modules accepted: Orders

## 2023-02-24 ENCOUNTER — Other Ambulatory Visit: Payer: Self-pay

## 2023-02-24 ENCOUNTER — Other Ambulatory Visit (HOSPITAL_COMMUNITY): Payer: Self-pay

## 2023-02-24 NOTE — Progress Notes (Signed)
Specialty Pharmacy Refill Coordination Note  Mariah Farrell is a 82 y.o. female contacted today regarding refills of specialty medication(s) Incobotulinumtoxina .  Patient requested Courier to Provider Office  on 03/02/23  to verified address [facility] [address] [place] [address]   Medication will be filled on 03/01/23.

## 2023-03-01 ENCOUNTER — Other Ambulatory Visit: Payer: Self-pay

## 2023-03-01 ENCOUNTER — Other Ambulatory Visit (HOSPITAL_COMMUNITY): Payer: Self-pay

## 2023-03-02 NOTE — Telephone Encounter (Signed)
We received 400 units from [facility], can use whatever isn't used at upcoming appt for next time.

## 2023-03-05 DIAGNOSIS — C678 Malignant neoplasm of overlapping sites of bladder: Secondary | ICD-10-CM | POA: Diagnosis not present

## 2023-03-08 ENCOUNTER — Encounter: Payer: Self-pay | Admitting: Internal Medicine

## 2023-03-08 ENCOUNTER — Ambulatory Visit: Payer: Medicare HMO | Admitting: Internal Medicine

## 2023-03-08 VITALS — BP 130/86 | HR 71 | Temp 98.1°F | Ht 60.0 in | Wt 186.8 lb

## 2023-03-08 DIAGNOSIS — M961 Postlaminectomy syndrome, not elsewhere classified: Secondary | ICD-10-CM | POA: Diagnosis not present

## 2023-03-08 DIAGNOSIS — M545 Low back pain, unspecified: Secondary | ICD-10-CM | POA: Diagnosis not present

## 2023-03-08 DIAGNOSIS — R1031 Right lower quadrant pain: Secondary | ICD-10-CM | POA: Diagnosis not present

## 2023-03-08 DIAGNOSIS — R1011 Right upper quadrant pain: Secondary | ICD-10-CM

## 2023-03-08 LAB — COMPREHENSIVE METABOLIC PANEL
ALT: 16 U/L (ref 0–35)
AST: 23 U/L (ref 0–37)
Albumin: 4 g/dL (ref 3.5–5.2)
Alkaline Phosphatase: 56 U/L (ref 39–117)
BUN: 22 mg/dL (ref 6–23)
CO2: 28 meq/L (ref 19–32)
Calcium: 9.3 mg/dL (ref 8.4–10.5)
Chloride: 103 meq/L (ref 96–112)
Creatinine, Ser: 1.17 mg/dL (ref 0.40–1.20)
GFR: 42.93 mL/min — ABNORMAL LOW (ref >60.00)
Glucose, Bld: 91 mg/dL (ref 70–99)
Potassium: 4.2 meq/L (ref 3.5–5.1)
Sodium: 139 meq/L (ref 135–145)
Total Bilirubin: 0.6 mg/dL (ref 0.2–1.2)
Total Protein: 6.3 g/dL (ref 6.0–8.3)

## 2023-03-08 LAB — CBC WITH DIFFERENTIAL/PLATELET
Basophils Absolute: 0 10*3/uL (ref 0.0–0.1)
Basophils Relative: 1.2 % (ref 0.0–3.0)
Eosinophils Absolute: 0.2 10*3/uL (ref 0.0–0.7)
Eosinophils Relative: 5.2 % — ABNORMAL HIGH (ref 0.0–5.0)
HCT: 37.3 % (ref 36.0–46.0)
Hemoglobin: 12.3 g/dL (ref 12.0–15.0)
Lymphocytes Relative: 20.5 % (ref 12.0–46.0)
Lymphs Abs: 0.9 10*3/uL (ref 0.7–4.0)
MCHC: 33.1 g/dL (ref 30.0–36.0)
MCV: 98.3 fL (ref 78.0–100.0)
Monocytes Absolute: 0.5 10*3/uL (ref 0.1–1.0)
Monocytes Relative: 11.7 % (ref 3.0–12.0)
Neutro Abs: 2.6 10*3/uL (ref 1.4–7.7)
Neutrophils Relative %: 61.4 % (ref 43.0–77.0)
Platelets: 272 10*3/uL (ref 150.0–400.0)
RBC: 3.79 Mil/uL — ABNORMAL LOW (ref 3.87–5.11)
RDW: 13.4 % (ref 11.5–15.5)
WBC: 4.3 10*3/uL (ref 4.0–10.5)

## 2023-03-08 LAB — SEDIMENTATION RATE: Sed Rate: 14 mm/h (ref 0–30)

## 2023-03-08 MED ORDER — TRAMADOL HCL 50 MG PO TABS: 50.0000 mg | ORAL_TABLET | Freq: Four times a day (QID) | ORAL | 1 refills | Status: DC | PRN

## 2023-03-08 MED ORDER — METHYLPREDNISOLONE 4 MG PO TBPK: ORAL_TABLET | ORAL | 0 refills | Status: DC

## 2023-03-08 NOTE — Assessment & Plan Note (Addendum)
Blue-Emu cream was recommended to use 2-3 times a day Celebrex qd Tramadol, Gabapentin prn Check abd [place]

## 2023-03-08 NOTE — Patient Instructions (Signed)
Massage mat

## 2023-03-08 NOTE — Assessment & Plan Note (Signed)
Medrol pack Tramadol prn  Potential benefits of a long term opioids use as well as potential risks (i.e. addiction risk, apnea etc) and complications (i.e. Somnolence, constipation and others) were explained to the patient and were aknowledged. F/u w/Dr Ethelene Hal next week

## 2023-03-08 NOTE — Assessment & Plan Note (Signed)
R flank pain - likely related to LS radiculitis Abd [place]

## 2023-03-08 NOTE — Progress Notes (Signed)
Subjective:  Patient ID: Mariah Farrell, female    DOB: 04/10/1941  Age: 82 y.o. MRN: [id number]  CC: Hip Pain (Radiates to her abdomen area. Only on her right side. 3 months patient had a Cortizone shot which helped for a little while not long.  )   HPI Mariah Farrell presents for LBP, R side of the abd pain Worse w/walking, standing  Outpatient Medications Prior to Visit  Medication Sig Dispense Refill   Biotin 5000 MCG TABS Take 1 tablet by mouth daily.     calcitonin, salmon, (MIACALCIN/FORTICAL) 200 UNIT/ACT nasal spray USE ONE SPRAY IN ONE NOSTRIL DAILY. ALTERNATE NOSTRILS 11.1 mL 11   calcium-vitamin D (OSCAL WITH D) 500-200 MG-UNIT per tablet Take 1 tablet by mouth daily.     celecoxib (CELEBREX) 200 MG capsule Take 1 capsule (200 mg total) by mouth daily as needed. 90 capsule 1   cetirizine (ZYRTEC) 10 MG tablet Take 10 mg by mouth daily.     Cholecalciferol (VITAMIN D3) 2000 units capsule Take 1 capsule (2,000 Units total) by mouth daily. 100 capsule 3   diazepam (VALIUM) 5 MG tablet TAKE 1/2 TO 1 TABLET BY MOUTH EVERY 12 HOURS AS NEEDED FOR ANXIETY, INSOMNIA 180 tablet 1   Diclofenac Sodium (VOLTAREN EX) Apply topically as needed.     DULoxetine (CYMBALTA) 60 MG capsule Take 1 capsule (60 mg total) by mouth daily. 90 capsule 3   gabapentin (NEURONTIN) 100 MG capsule Take 1 capsule (100 mg total) by mouth 3 (three) times daily as needed. 90 capsule 3   glucosamine-chondroitin 500-400 MG tablet Take 1 tablet by mouth daily.     incobotulinumtoxinA (XEOMIN) 100 units SOLR injection Inject 400 Units into the muscle every 3 (three) months. Inject 400 units into the muscle every 90 days. 4 each 0   latanoprost (XALATAN) 0.005 % ophthalmic solution SMARTSIG:In Eye(s)     levothyroxine (SYNTHROID) 25 MCG tablet TAKE ONE TABLET BY MOUTH DAILY BEFORE BREAKFAST 90 tablet 3   melatonin 3 MG TABS tablet Take 3 mg by mouth at bedtime as needed.     Multiple Vitamin  (MULTIVITAMIN) tablet Take 1 tablet by mouth daily.     pantoprazole (PROTONIX) 40 MG tablet TAKE ONE TABLET BY MOUTH DAILY 90 tablet 3   timolol (TIMOPTIC) 0.5 % ophthalmic solution Place 1 drop into both eyes daily.     vitamin C (ASCORBIC ACID) 500 MG tablet Take 1,000 mg by mouth daily.      traMADol (ULTRAM) 50 MG tablet Take 50 mg by mouth 2 (two) times daily as needed.     COVID-19 mRNA vaccine 2023-2024 (COMIRNATY) syringe Inject into the muscle. (Patient not taking: Reported on 06/24/2022) 0.3 mL 0   diclofenac (CATAFLAM) 50 MG tablet Take 1 tablet (50 mg total) by mouth 2 (two) times daily. 60 tablet 3   furosemide (LASIX) 20 MG tablet TAKE 1 TABLET BY MOUTH DAILY 30 tablet 0   nitrofurantoin, macrocrystal-monohydrate, (MACROBID) 100 MG capsule Take 1 capsule (100 mg total) by mouth 2 (two) times daily. 10 capsule 0   Facility-Administered Medications Prior to Visit  Medication Dose Route Frequency Provider Last Rate Last Admin   incobotulinumtoxinA (XEOMIN) 100 units injection 100 Units  100 Units Intramuscular Once Levert Feinstein, MD       incobotulinumtoxinA (XEOMIN) 100 units injection 200 Units  200 Units Intramuscular Q90 days Levert Feinstein, MD        ROS: Review of Systems  Objective:  Subjective:  Patient ID: Mariah Farrell, female    DOB: 04/10/1941  Age: 82 y.o. MRN: [id number]  CC: Hip Pain (Radiates to her abdomen area. Only on her right side. 3 months patient had a Cortizone shot which helped for a little while not long.  )   HPI Mariah Farrell presents for LBP, R side of the abd pain Worse w/walking, standing  Outpatient Medications Prior to Visit  Medication Sig Dispense Refill   Biotin 5000 MCG TABS Take 1 tablet by mouth daily.     calcitonin, salmon, (MIACALCIN/FORTICAL) 200 UNIT/ACT nasal spray USE ONE SPRAY IN ONE NOSTRIL DAILY. ALTERNATE NOSTRILS 11.1 mL 11   calcium-vitamin D (OSCAL WITH D) 500-200 MG-UNIT per tablet Take 1 tablet by mouth daily.     celecoxib (CELEBREX) 200 MG capsule Take 1 capsule (200 mg total) by mouth daily as needed. 90 capsule 1   cetirizine (ZYRTEC) 10 MG tablet Take 10 mg by mouth daily.     Cholecalciferol (VITAMIN D3) 2000 units capsule Take 1 capsule (2,000 Units total) by mouth daily. 100 capsule 3   diazepam (VALIUM) 5 MG tablet TAKE 1/2 TO 1 TABLET BY MOUTH EVERY 12 HOURS AS NEEDED FOR ANXIETY, INSOMNIA 180 tablet 1   Diclofenac Sodium (VOLTAREN EX) Apply topically as needed.     DULoxetine (CYMBALTA) 60 MG capsule Take 1 capsule (60 mg total) by mouth daily. 90 capsule 3   gabapentin (NEURONTIN) 100 MG capsule Take 1 capsule (100 mg total) by mouth 3 (three) times daily as needed. 90 capsule 3   glucosamine-chondroitin 500-400 MG tablet Take 1 tablet by mouth daily.     incobotulinumtoxinA (XEOMIN) 100 units SOLR injection Inject 400 Units into the muscle every 3 (three) months. Inject 400 units into the muscle every 90 days. 4 each 0   latanoprost (XALATAN) 0.005 % ophthalmic solution SMARTSIG:In Eye(s)     levothyroxine (SYNTHROID) 25 MCG tablet TAKE ONE TABLET BY MOUTH DAILY BEFORE BREAKFAST 90 tablet 3   melatonin 3 MG TABS tablet Take 3 mg by mouth at bedtime as needed.     Multiple Vitamin  (MULTIVITAMIN) tablet Take 1 tablet by mouth daily.     pantoprazole (PROTONIX) 40 MG tablet TAKE ONE TABLET BY MOUTH DAILY 90 tablet 3   timolol (TIMOPTIC) 0.5 % ophthalmic solution Place 1 drop into both eyes daily.     vitamin C (ASCORBIC ACID) 500 MG tablet Take 1,000 mg by mouth daily.      traMADol (ULTRAM) 50 MG tablet Take 50 mg by mouth 2 (two) times daily as needed.     COVID-19 mRNA vaccine 2023-2024 (COMIRNATY) syringe Inject into the muscle. (Patient not taking: Reported on 06/24/2022) 0.3 mL 0   diclofenac (CATAFLAM) 50 MG tablet Take 1 tablet (50 mg total) by mouth 2 (two) times daily. 60 tablet 3   furosemide (LASIX) 20 MG tablet TAKE 1 TABLET BY MOUTH DAILY 30 tablet 0   nitrofurantoin, macrocrystal-monohydrate, (MACROBID) 100 MG capsule Take 1 capsule (100 mg total) by mouth 2 (two) times daily. 10 capsule 0   Facility-Administered Medications Prior to Visit  Medication Dose Route Frequency Provider Last Rate Last Admin   incobotulinumtoxinA (XEOMIN) 100 units injection 100 Units  100 Units Intramuscular Once Levert Feinstein, MD       incobotulinumtoxinA (XEOMIN) 100 units injection 200 Units  200 Units Intramuscular Q90 days Levert Feinstein, MD        ROS: Review of Systems  Objective:  BP 130/86 (BP Location: Left Arm, Patient Position: Sitting, Cuff Size: Normal)   Pulse 71   Temp 98.1 F (36.7 C) (Oral)   Ht 5' (1.524 m)   Wt 186 lb 12.8 oz (84.7 kg)   SpO2 95%   BMI 36.48 kg/m   BP Readings from Last 3 Encounters:  03/08/23 130/86  12/07/22 117/73  08/12/22 125/75    Wt Readings from Last 3 Encounters:  03/08/23 186 lb 12.8 oz (84.7 kg)  09/21/22 188 lb (85.3 kg)  08/12/22 188 lb (85.3 kg)    Physical Exam  Lab Results  Component Value Date   WBC 5.1 06/24/2022   HGB 13.0 06/24/2022   HCT 38.0 06/24/2022   PLT 301.0 06/24/2022   GLUCOSE 85 06/24/2022   CHOL 199 06/24/2022   TRIG 144.0 06/24/2022   HDL 55.00 06/24/2022   LDLCALC 115 (H) 06/24/2022    ALT 17 06/24/2022   AST 23 06/24/2022   NA 139 06/24/2022   K 4.2 06/24/2022   CL 102 06/24/2022   CREATININE 1.26 (H) 06/24/2022   BUN 19 06/24/2022   CO2 29 06/24/2022   TSH 0.83 06/24/2022   INR 1.0 04/10/2021    MR BRAIN WO CONTRAST  Result Date: 05/22/2022  [facility] [address], [address] [place], [place] [address] [phone] NEUROIMAGING REPORT STUDY DATE: 05/22/2022 PATIENT NAME: Mariah Farrell DOB: June 02, 1940 MRN: [id number] EXAM: MRI Brain without contrast ORDERING CLINICIAN: Levert Feinstein MD, PhD CLINICAL HISTORY: 82 year old woman with headache COMPARISON FILMS: 12/01/2010 TECHNIQUE: MRI of the brain without contrast was obtained utilizing 5 mm axial slices with T1, T2, T2 flair, SWI and diffusion weighted views.  T1 sagittal and T2 coronal views were obtained. CONTRAST: none IMAGING SITE: [facility], [address] [place], [place] FINDINGS: On sagittal images, the spinal cord is imaged caudally to C3 and is normal in caliber.   The contents of the posterior fossa are of normal size and position.   The pituitary gland and optic chiasm appear normal.    There is mild generalized cortical atrophy, normal for age and progressed compared to the 2012 MRI   the ventricles are normal in size and without distortion.  There are no abnormal extra-axial collections of fluid.  The cerebellum and brainstem appears normal.   The deep gray matter appears normal.  There are scattered T2/FLAIR hyperintense foci in the subcortical and deep white matter of both cerebral hemispheres.  None of these appear to be acute.  There is progression compared to the 2012 MRI.  Diffusion weighted images are normal.  Susceptibility weighted images are normal.  The orbits appear normal.   The VIIth/VIIIth nerve complex appears normal.  The mastoid air cells appear normal.  The paranasal sinuses appear normal.  Flow voids are identified within the major intracerebral arteries.     This MRI of  the brain without contrast shows the following: Mild generalized cortical atrophy that is normal for age. Scattered T2/FLAIR hyperintense foci in the hemispheres consistent with mild chronic microvascular ischemic change, progressed compared to the 2012 MRI No acute findings. INTERPRETING PHYSICIAN: Richard A. Epimenio Foot, MD, PhD, FAAN Certified in  Neuroimaging by [employer]    Assessment & Plan:   Problem List Items Addressed This Visit     Lumbar back pain - Primary    Blue-Emu cream was recommended to use 2-3 times a day Celebrex qd Tramadol, Gabapentin prn Check abd [place]      Relevant Medications

## 2023-03-10 ENCOUNTER — Encounter: Payer: Self-pay | Admitting: Neurology

## 2023-03-10 ENCOUNTER — Ambulatory Visit (INDEPENDENT_AMBULATORY_CARE_PROVIDER_SITE_OTHER): Payer: Medicare HMO | Admitting: Neurology

## 2023-03-10 VITALS — BP 118/74

## 2023-03-10 DIAGNOSIS — G243 Spasmodic torticollis: Secondary | ICD-10-CM | POA: Diagnosis not present

## 2023-03-10 MED ORDER — INCOBOTULINUMTOXINA 100 UNITS IM SOLR
100.0000 [IU] | Freq: Once | INTRAMUSCULAR | Status: DC
Start: 2023-03-10 — End: 2023-06-23

## 2023-03-10 NOTE — Progress Notes (Signed)
Chief Complaint  Patient presents with   xeomin    Xeomin, rm 14, consent signed, complains of hip pain      ASSESSMENT AND PLAN  Mariah Farrell is a 82 y.o. female   Right-sided neck pain, radiating pain to right occipital region Multilevel cervical degenerative changes, most noticeable on the right side, Cervical dystonia Abnormal neck posturing, mild retrocollis, right tilt, mild right shoulder elevation, significant tenderness muscle spasm and right cervical paraspinal muscles,  She had right C1, C2 nerve block by neurosurgical pain management on April 16, 2022, with limited improvement  Higher dose of Cymbalta 40 mg helped her symptoms better, no significant side effect, further increase to 60 mg daily,  EMG guided xeomin injection, used 100 units on Oct 9th 2024 ( dissolved into 4 cc of NS)   Right levator scapular 25 units Right iliocostalis muscle 12.5 units Right splenius cervix 25 units. Right splenius capitis 12.5 units Right longissimus capitis 12.5 units Right upper trapezius 12.5 units    DIAGNOSTIC DATA (LABS, IMAGING, TESTING) - I reviewed patient records, labs, notes, testing and imaging myself where available.   MEDICAL HISTORY:  Mariah Farrell is a 82 year old female, seen in request by orthopedic surgeon Dr. Vira Browns for evaluation of right neck pain, she is accompanied by her husband Dr. Montez Morita, retired orthopedic surgeon at today's visit on April 08, 2022, PCP Plotnikov, Georgina Quint, MD   I reviewed and summarized the referring note. PMHx. Chronic insomnia Hypothyrodism GERD Lumbar decompression surgery Bilateral Rotator cuff Bilateral Knee replacement.  She had no history of cervical degenerative disease, chronic neck pain, she had worsening right shoulder blade pain in the femoral 2023 began to receive some home neck traction, personally reviewed MRI cervical spine January 12, 2022, multilevel degenerative changes,  ankylosis C4-5, variable degree of foraminal narrowing, no significant canal stenosis  In August 2023, she began to notice unbearable right neck pain radiating pain to right occipital region, moderate to severe on a daily basis, she received right C2-3 nerve block by Dr. Algis Greenhouse in August 2023, without significant improvement, pending nerve block by neurosurgical pain management of right C1, 2 on April 16, 2022,  She also tried warm compression, NSAIDs as needed, gabapentin with limited help, the constant moderate to severe right neck deep achy pain, with occasionally sharp radiating pain to right occipital parietal region has affected life quality, hope to receive some relief  Update May 11, 2022 She had right C1-C2 facet joint injection with only very temporary relief, constant right upper cervical region deep achy pain, difficulty sleeping, limited her daily activity  She is taking Celebrex 200 mg as needed along with Valium 2.5 mg as needed, gabapentin 300 mg 1 to 2 tablets at nighttime, frequent Aleve,   In addition she complains of intermittent headaches, often triggered by sharp radiating pain from neck,  UPDATE August 12 2022; She has mild improvement with last injection, has transient difficulty raising up her neck, denies significant neck muscle weakness, in the last 2 weeks, has previous injection wearing off, she noticed increased neck pain, radiating to right shoulder  She tolerates Cymbalta 20 mg, which seems to help her symptoms  Personally reviewed MRI of brain in Feb 2024, mild small vessel disease.  UPDATE July 8th 2024: She tolerated higher dose of Cymbalta 40 mg, does help her neck pain, will further increase to 60 mg daily, last injection using Xeomin 200 units has really helped her right-sided neck pain, headache improved  as well,  UPDATE Mar 10 2023: She responded well to previous injection 200 units of xeomin, has helped her right-sided neck pain at least 75%, no  significant side effect, at the end of the injection cycle did notice recurrent neck pain,  PHYSICAL EXAM:   Today's Vitals   03/10/23 1344  BP: 118/74   There is no height or weight on file to calculate BMI.  MOTOR: Abnormal neck posturing, mild right shoulder elevation, retrocollis, significant tenderness of cervical paraspinal muscles, at  bilateral levator scapula, especially at right upper cervical paraspinal muscles.    GAIT/STANCE: Need push-up to get up from seated position, cautious  REVIEW OF SYSTEMS:  Full 14 system review of systems performed and notable only for as above All other review of systems were negative.   ALLERGIES: Allergies  Allergen Reactions   Doxycycline Hives   Crab [Shellfish Allergy]    Penicillins    Prolia [Denosumab]     Jaw necrosis wound    HOME MEDICATIONS: Current Outpatient Medications  Medication Sig Dispense Refill   Biotin 5000 MCG TABS Take 1 tablet by mouth daily.     calcitonin, salmon, (MIACALCIN/FORTICAL) 200 UNIT/ACT nasal spray USE ONE SPRAY IN ONE NOSTRIL DAILY. ALTERNATE NOSTRILS 11.1 mL 11   calcium-vitamin D (OSCAL WITH D) 500-200 MG-UNIT per tablet Take 1 tablet by mouth daily.     cetirizine (ZYRTEC) 10 MG tablet Take 10 mg by mouth daily.     Cholecalciferol (VITAMIN D3) 2000 units capsule Take 1 capsule (2,000 Units total) by mouth daily. 100 capsule 3   diazepam (VALIUM) 5 MG tablet TAKE 1/2 TO 1 TABLET BY MOUTH EVERY 12 HOURS AS NEEDED FOR ANXIETY, INSOMNIA 180 tablet 1   Diclofenac Sodium (VOLTAREN EX) Apply topically as needed.     DULoxetine (CYMBALTA) 60 MG capsule Take 1 capsule (60 mg total) by mouth daily. 90 capsule 3   gabapentin (NEURONTIN) 100 MG capsule Take 1 capsule (100 mg total) by mouth 3 (three) times daily as needed. 90 capsule 3   glucosamine-chondroitin 500-400 MG tablet Take 1 tablet by mouth daily.     incobotulinumtoxinA (XEOMIN) 100 units SOLR injection Inject 400 Units into the muscle every 3  (three) months. Inject 400 units into the muscle every 90 days. 4 each 0   latanoprost (XALATAN) 0.005 % ophthalmic solution SMARTSIG:In Eye(s)     levothyroxine (SYNTHROID) 25 MCG tablet TAKE ONE TABLET BY MOUTH DAILY BEFORE BREAKFAST 90 tablet 3   melatonin 3 MG TABS tablet Take 3 mg by mouth at bedtime as needed.     methylPREDNISolone (MEDROL DOSEPAK) 4 MG TBPK tablet As directed 21 tablet 0   Multiple Vitamin (MULTIVITAMIN) tablet Take 1 tablet by mouth daily.     pantoprazole (PROTONIX) 40 MG tablet TAKE ONE TABLET BY MOUTH DAILY 90 tablet 3   timolol (TIMOPTIC) 0.5 % ophthalmic solution Place 1 drop into both eyes daily.     traMADol (ULTRAM) 50 MG tablet Take 1 tablet (50 mg total) by mouth every 6 (six) hours as needed. 120 tablet 1   vitamin C (ASCORBIC ACID) 500 MG tablet Take 1,000 mg by mouth daily.      celecoxib (CELEBREX) 200 MG capsule Take 1 capsule (200 mg total) by mouth daily as needed. (Patient not taking: Reported on 03/10/2023) 90 capsule 1   Current Facility-Administered Medications  Medication Dose Route Frequency Provider Last Rate Last Admin   incobotulinumtoxinA (XEOMIN) 100 units injection 100 Units  100 Units  Intramuscular Once Levert Feinstein, MD       incobotulinumtoxinA [facility]) 100 units injection 200 Units  200 Units Intramuscular Q90 days Levert Feinstein, MD        PAST MEDICAL HISTORY: Past Medical History:  Diagnosis Date   Arthritis    Cancer (HCC) 13 years ago   bladder   GERD (gastroesophageal reflux disease)    Goiter    synthroid used to shrink   H/O hiatal hernia    Headache(784.0)    migraines in morning   PONV (postoperative nausea and vomiting)    Seasonal allergies     PAST SURGICAL HISTORY: Past Surgical History:  Procedure Laterality Date   ARTHRODESIS FOOT WITH WEIL OSTEOTOMY Right 07/26/2014   Procedure: RIGHT HALLUX METATARSOPHALANGEAL JOINT ARTHRODESIS; RIGHT SECOND METATARSAL WEIL OSTEOTOMY; DORSAL CAPSULOTOMY EXTENSOR TENDON  LENGTHENING;  Surgeon: Toni Arthurs, MD;  Location: [facility];  Service: Orthopedics;  Laterality: Right;   BLADDER SURGERY  13 years ago   BUNIONECTOMY Bilateral    COLONOSCOPY     LUMBAR FUSION  May 2014   L4-L5   ROTATOR CUFF REPAIR Bilateral ~2010, 2012   SPINAL FUSION     L1, L2   TONSILLECTOMY  as child   TOTAL KNEE ARTHROPLASTY Right 07/19/2012   Procedure: TOTAL KNEE ARTHROPLASTY;  Surgeon: Shelda Pal, MD;  Location: [facility];  Service: Orthopedics;  Laterality: Right;   TOTAL KNEE ARTHROPLASTY Left 08/01/2013   Procedure: LEFT TOTAL KNEE ARTHROPLASTY;  Surgeon: Shelda Pal, MD;  Location: [facility];  Service: Orthopedics;  Laterality: Left;    FAMILY HISTORY: Family History  Problem Relation Age of Onset   Cancer Brother 70       pancreatic   Brain cancer Father     SOCIAL HISTORY: Social History   Socioeconomic History   Marital status: Married    Spouse name: Not on file   Number of children: Not on file   Years of education: Not on file   Highest education level: Not on file  Occupational History   Not on file  Tobacco Use   Smoking status: Former    Current packs/day: 0.00    Average packs/day: 0.3 packs/day for 5.0 years (1.3 ttl pk-yrs)    Types: Cigarettes    Start date: 06/02/1959    Quit date: 06/01/1964    Years since quitting: 58.8   Smokeless tobacco: Never  Vaping Use   Vaping status: Never Used  Substance and Sexual Activity   Alcohol use: Yes    Comment: 1-2 times weekly   Drug use: No   Sexual activity: Not on file  Other Topics Concern   Not on file  Social History Narrative   Right handed   Caffeine-1 cup daily   Social Determinants of Health   Financial Resource Strain: Low Risk  (09/21/2022)   Overall Financial Resource Strain (CARDIA)    Difficulty of Paying Living Expenses: Not hard at all  Food Insecurity: No Food Insecurity (09/21/2022)   Hunger Vital Sign    Worried About Running Out of Food in the Last Year:  Never true    Ran Out of Food in the Last Year: Never true  Transportation Needs: No Transportation Needs (09/21/2022)   PRAPARE - Administrator, Civil Service (Medical): No    Lack of Transportation (Non-Medical): No  Physical Activity: Insufficiently Active (09/21/2022)   Exercise Vital Sign    Days of Exercise per Week: 3 days  Minutes of Exercise per Session: 40 min  Stress: No Stress Concern Present (09/21/2022)   [employer] of Occupational Health - Occupational Stress Questionnaire    Feeling of Stress : Not at all  Social Connections: Unknown (09/21/2022)   Social Connection and Isolation Panel [NHANES]    Frequency of Communication with Friends and Family: Three times a week    Frequency of Social Gatherings with Friends and Family: More than three times a week    Attends Religious Services: Not on file    Active Member of Clubs or Organizations: Yes    Attends Banker Meetings: More than 4 times per year    Marital Status: Married  Catering manager Violence: Not At Risk (09/21/2022)   Humiliation, Afraid, Rape, and Kick questionnaire    Fear of Current or Ex-Partner: No    Emotionally Abused: No    Physically Abused: No    Sexually Abused: No      Levert Feinstein, M.D. Ph.D.  [facility] [address] [place], [place] [address] Ph: [phone] Fax: [phone]  CC:  Plotnikov, Georgina Quint, MD [address] [place],  [place] [address]  Plotnikov, Georgina Quint, MD

## 2023-03-10 NOTE — Progress Notes (Signed)
xenomin 100units x 1 vial  [id number] Exp-09.2026 s/p Bacteriostatic 0.9% Sodium Chloride- 4mL  [id number] Expiration: 04.01.2025 NDC: 2440102725 Dx: M54.12 WITNESSED DG:UYQIH, RN

## 2023-03-15 DIAGNOSIS — M5416 Radiculopathy, lumbar region: Secondary | ICD-10-CM | POA: Diagnosis not present

## 2023-03-19 ENCOUNTER — Ambulatory Visit
Admission: RE | Admit: 2023-03-19 | Discharge: 2023-03-19 | Disposition: A | Discharge status: Home / Self Care | Payer: Medicare HMO | Source: Ambulatory Visit | Attending: Internal Medicine | Admitting: Internal Medicine

## 2023-03-19 DIAGNOSIS — R1031 Right lower quadrant pain: Secondary | ICD-10-CM | POA: Diagnosis not present

## 2023-03-22 DIAGNOSIS — M545 Low back pain, unspecified: Secondary | ICD-10-CM | POA: Diagnosis not present

## 2023-03-22 DIAGNOSIS — M549 Dorsalgia, unspecified: Secondary | ICD-10-CM | POA: Diagnosis not present

## 2023-03-22 DIAGNOSIS — G8929 Other chronic pain: Secondary | ICD-10-CM | POA: Diagnosis not present

## 2023-03-22 DIAGNOSIS — M4310 Spondylolisthesis, site unspecified: Secondary | ICD-10-CM | POA: Diagnosis not present

## 2023-03-22 DIAGNOSIS — Z981 Arthrodesis status: Secondary | ICD-10-CM | POA: Diagnosis not present

## 2023-03-22 DIAGNOSIS — M5416 Radiculopathy, lumbar region: Secondary | ICD-10-CM | POA: Diagnosis not present

## 2023-03-24 DIAGNOSIS — H401131 Primary open-angle glaucoma, bilateral, mild stage: Secondary | ICD-10-CM | POA: Diagnosis not present

## 2023-03-24 DIAGNOSIS — H1789 Other corneal scars and opacities: Secondary | ICD-10-CM | POA: Diagnosis not present

## 2023-03-24 DIAGNOSIS — H2511 Age-related nuclear cataract, right eye: Secondary | ICD-10-CM | POA: Diagnosis not present

## 2023-03-25 DIAGNOSIS — M5416 Radiculopathy, lumbar region: Secondary | ICD-10-CM | POA: Diagnosis not present

## 2023-03-26 DIAGNOSIS — Z4789 Encounter for other orthopedic aftercare: Secondary | ICD-10-CM | POA: Insufficient documentation

## 2023-03-26 DIAGNOSIS — M1811 Unilateral primary osteoarthritis of first carpometacarpal joint, right hand: Secondary | ICD-10-CM | POA: Diagnosis not present

## 2023-04-08 DIAGNOSIS — M151 Heberden's nodes (with arthropathy): Secondary | ICD-10-CM | POA: Diagnosis not present

## 2023-04-08 DIAGNOSIS — M79644 Pain in right finger(s): Secondary | ICD-10-CM | POA: Diagnosis not present

## 2023-04-08 DIAGNOSIS — M25641 Stiffness of right hand, not elsewhere classified: Secondary | ICD-10-CM | POA: Diagnosis not present

## 2023-04-13 DIAGNOSIS — M5416 Radiculopathy, lumbar region: Secondary | ICD-10-CM | POA: Diagnosis not present

## 2023-04-14 ENCOUNTER — Other Ambulatory Visit: Payer: Self-pay

## 2023-04-16 DIAGNOSIS — M79644 Pain in right finger(s): Secondary | ICD-10-CM | POA: Diagnosis not present

## 2023-05-03 DIAGNOSIS — M13841 Other specified arthritis, right hand: Secondary | ICD-10-CM | POA: Diagnosis not present

## 2023-05-03 DIAGNOSIS — Z4789 Encounter for other orthopedic aftercare: Secondary | ICD-10-CM | POA: Diagnosis not present

## 2023-05-05 DIAGNOSIS — Z981 Arthrodesis status: Secondary | ICD-10-CM | POA: Diagnosis not present

## 2023-05-05 DIAGNOSIS — M4326 Fusion of spine, lumbar region: Secondary | ICD-10-CM | POA: Insufficient documentation

## 2023-05-05 DIAGNOSIS — M549 Dorsalgia, unspecified: Secondary | ICD-10-CM | POA: Diagnosis not present

## 2023-05-05 DIAGNOSIS — M4310 Spondylolisthesis, site unspecified: Secondary | ICD-10-CM | POA: Diagnosis not present

## 2023-05-05 DIAGNOSIS — M5416 Radiculopathy, lumbar region: Secondary | ICD-10-CM | POA: Diagnosis not present

## 2023-05-07 ENCOUNTER — Other Ambulatory Visit (HOSPITAL_COMMUNITY): Payer: Self-pay

## 2023-05-14 DIAGNOSIS — M545 Low back pain, unspecified: Secondary | ICD-10-CM | POA: Diagnosis not present

## 2023-05-21 ENCOUNTER — Other Ambulatory Visit: Payer: Self-pay | Admitting: Neurology

## 2023-05-21 ENCOUNTER — Other Ambulatory Visit: Payer: Self-pay

## 2023-05-21 NOTE — Progress Notes (Signed)
Specialty Pharmacy Refill Coordination Note  Mariah Farrell is a 82 y.o. female contacted today regarding refills of specialty medication(s) IncobotulinumtoxinA [facility])   Patient requested Courier to Provider Office   Delivery date: 06/08/23   Verified address: [facility]  [address], [place] [address]   Medication will be filled on 06/07/23 pending a refill request.

## 2023-05-24 ENCOUNTER — Other Ambulatory Visit: Payer: Self-pay

## 2023-05-24 MED ORDER — XEOMIN 100 UNITS IM SOLR
400.0000 [IU] | Freq: Once | INTRAMUSCULAR | 0 refills | Status: AC
  Filled 2023-05-24 – 2023-09-17 (×3): qty 4, 90d supply, fill #0

## 2023-06-01 ENCOUNTER — Telehealth: Payer: Self-pay | Admitting: Neurology

## 2023-06-01 DIAGNOSIS — M546 Pain in thoracic spine: Secondary | ICD-10-CM | POA: Diagnosis not present

## 2023-06-01 DIAGNOSIS — M542 Cervicalgia: Secondary | ICD-10-CM | POA: Diagnosis not present

## 2023-06-01 NOTE — Telephone Encounter (Signed)
 Submitted auth renewal request via CMM, status is pending. Key: [id number]

## 2023-06-03 ENCOUNTER — Other Ambulatory Visit: Payer: Self-pay

## 2023-06-03 NOTE — Telephone Encounter (Signed)
 Received approval, pt will continue to fill through [facility]  Auth: [id number] (06/02/23-05/31/24)

## 2023-06-07 ENCOUNTER — Other Ambulatory Visit (HOSPITAL_COMMUNITY): Payer: Self-pay

## 2023-06-07 ENCOUNTER — Other Ambulatory Visit: Payer: Self-pay

## 2023-06-07 NOTE — Telephone Encounter (Signed)
 The copay for this is $703.85, I called PT to advise her of copay amount-advised pt to call her insurance and ask about the medicare payment plan or she can contact Jillian at [facility] for help with patient assistance, etc or pay the copay amount. PT states she will call her insurance first to inquire about payment plan and to find out why her copay is so high. PT understands payment must be made before med can be shipped out to [facility].I left my contact info with her to call me back to update me on what she was able to work out.I spent 45 mins researching and outreaching and discussing options with this PT.

## 2023-06-09 ENCOUNTER — Other Ambulatory Visit: Payer: Self-pay

## 2023-06-09 NOTE — Telephone Encounter (Signed)
 Pt calling to discuss options to lower copay for Botox. Transferred

## 2023-06-09 NOTE — Telephone Encounter (Signed)
 Spoke with patient, informed her that the Xeomin  Savings that I am familiar with excludes Medicare plans. We do still have 300 units here from her last fill that we did not use at her previous appointment. Pt states she did well with only using 100 units last time. Informed [facility] that we can hold off on another fill for right now.

## 2023-06-16 ENCOUNTER — Ambulatory Visit: Payer: Medicare HMO | Admitting: Neurology

## 2023-06-16 VITALS — BP 136/80 | HR 82

## 2023-06-16 DIAGNOSIS — G243 Spasmodic torticollis: Secondary | ICD-10-CM

## 2023-06-16 DIAGNOSIS — M5412 Radiculopathy, cervical region: Secondary | ICD-10-CM

## 2023-06-16 MED ORDER — INCOBOTULINUMTOXINA 100 UNITS IM SOLR
100.0000 [IU] | INTRAMUSCULAR | Status: DC
Start: 2023-06-16 — End: 2023-06-16

## 2023-06-16 MED ORDER — INCOBOTULINUMTOXINA 100 UNITS IM SOLR
100.0000 [IU] | INTRAMUSCULAR | Status: AC
Start: 2023-06-16 — End: ?
  Administered 2023-06-23: 100 [IU] via INTRAMUSCULAR

## 2023-06-16 NOTE — Progress Notes (Signed)
xeomin 100units x 1 vial  [id number] [phone] Exp-01/2025 B/B OR s/p Bacteriostatic 0.9% Sodium Chloride- 2mL  FAO:[id number] Expiration: 04/01/24 NDC: [id number] Dx: G24.3  WITNESSED MW:[id number] CMA

## 2023-06-16 NOTE — Progress Notes (Signed)
Chief Complaint  Patient presents with   Injections    Rm14, ALONE, Pt is well and ready for injection      ASSESSMENT AND PLAN  Mariah Farrell is a 83 y.o. female   Right-sided neck pain, radiating pain to right occipital region Multilevel cervical degenerative changes, most noticeable on the right side, Cervical dystonia Abnormal neck posturing, mild retrocollis, right tilt, mild right shoulder elevation, significant tenderness muscle spasm and right cervical paraspinal muscles,  She had right C1, C2 nerve block by neurosurgical pain management on April 16, 2022, with limited improvement  Higher dose of Cymbalta 40 mg helped her symptoms better, no significant side effect, further increase to 60 mg daily,  EMG guided xeomin injection, used 100 units on Oct 9th 2024 ( dissolved into 4 cc of NS)   Right levator scapular 25 units Right iliocostalis muscle 12.5 units Right splenius cervix 25 units. Right splenius capitis 12.5 units Right longissimus capitis 12.5 units Right upper trapezius 12.5 units    DIAGNOSTIC DATA (LABS, IMAGING, TESTING) - I reviewed patient records, labs, notes, testing and imaging myself where available.   MEDICAL HISTORY:  Mariah Farrell is a 83 year old female, seen in request by orthopedic surgeon Dr. Vira Browns for evaluation of right neck pain, she is accompanied by her husband Dr. Montez Morita, retired orthopedic surgeon at today's visit on April 08, 2022, PCP Plotnikov, Georgina Quint, MD   I reviewed and summarized the referring note. PMHx. Chronic insomnia Hypothyrodism GERD Lumbar decompression surgery Bilateral Rotator cuff Bilateral Knee replacement.  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 Algis Greenhouse in August 2023, without significant improvement, pending nerve block by neurosurgical pain management of right C1, 2 on April 16, 2022,  She also tried warm compression, NSAIDs as needed, gabapentin with limited help, the constant moderate to severe right neck deep achy pain, with occasionally sharp radiating pain to right occipital parietal region has affected life quality, hope to receive some relief  Update May 11, 2022 She had right C1-C2 facet joint injection with only very temporary relief, constant right upper cervical region deep achy pain, difficulty sleeping, limited her daily activity  She is taking Celebrex 200 mg as needed along with Valium 2.5 mg as needed, gabapentin 300 mg 1 to 2 tablets at nighttime, frequent Aleve,   In addition she complains of intermittent headaches, often triggered by sharp radiating pain from neck,  UPDATE August 12 2022; She has mild improvement with last injection, has transient difficulty raising up her neck, denies significant neck muscle weakness, in the last 2 weeks, has previous injection wearing off, she noticed increased neck pain, radiating to right shoulder  She tolerates Cymbalta 20 mg, which seems to help her symptoms  Personally reviewed MRI of brain in Feb 2024, mild small vessel disease.  UPDATE July 8th 2024: She tolerated higher dose of Cymbalta 40 mg, does help her neck pain, will further increase to 60 mg daily, last injection using Xeomin 200 units has really helped her right-sided neck pain, headache improved  as well,  UPDATE Mar 10 2023: She responded well to previous injection 200 units of xeomin, has helped her right-sided neck pain at least 75%, no significant  side effect, at the end of the injection cycle did notice recurrent neck pain,  UPDATE Jun 23 2023: She responded well to previous injection, there was no significant side effect noted.  PHYSICAL EXAM:   Today's Vitals   06/16/23 1358  BP: 136/80  Pulse: 82   There is no height or weight on file to calculate BMI.  MOTOR: Abnormal neck posturing, mild right shoulder elevation, retrocollis, significant tenderness of cervical paraspinal muscles, at  bilateral levator scapula, especially at right upper cervical paraspinal muscles.    GAIT/STANCE: Need push-up to get up from seated position, cautious  REVIEW OF SYSTEMS:  Full 14 system review of systems performed and notable only for as above All other review of systems were negative.   ALLERGIES: Allergies  Allergen Reactions   Doxycycline Hives   Crab [Shellfish Allergy]    Penicillins    Prolia [Denosumab]     Jaw necrosis wound    HOME MEDICATIONS: Current Outpatient Medications  Medication Sig Dispense Refill   Biotin 5000 MCG TABS Take 1 tablet by mouth daily.     calcitonin, salmon, (MIACALCIN/FORTICAL) 200 UNIT/ACT nasal spray USE ONE SPRAY IN ONE NOSTRIL DAILY. ALTERNATE NOSTRILS 11.1 mL 11   calcium-vitamin D (OSCAL WITH D) 500-200 MG-UNIT per tablet Take 1 tablet by mouth daily.     celecoxib (CELEBREX) 200 MG capsule Take 1 capsule (200 mg total) by mouth daily as needed. (Patient not taking: Reported on 03/10/2023) 90 capsule 1   cetirizine (ZYRTEC) 10 MG tablet Take 10 mg by mouth daily.     Cholecalciferol (VITAMIN D3) 2000 units capsule Take 1 capsule (2,000 Units total) by mouth daily. 100 capsule 3   diazepam (VALIUM) 5 MG tablet TAKE 1/2 TO 1 TABLET BY MOUTH EVERY 12 HOURS AS NEEDED FOR ANXIETY, INSOMNIA 180 tablet 1   Diclofenac Sodium (VOLTAREN EX) Apply topically as needed.     DULoxetine (CYMBALTA) 60 MG capsule Take 1 capsule (60 mg total) by mouth daily. 90 capsule 3   gabapentin (NEURONTIN) 100 MG  capsule Take 1 capsule (100 mg total) by mouth 3 (three) times daily as needed. 90 capsule 3   glucosamine-chondroitin 500-400 MG tablet Take 1 tablet by mouth daily.     incobotulinumtoxinA (XEOMIN) 100 units SOLR injection Inject 400 Units into the muscle every 3 (three) months. Inject 400 units into the muscle every 90 days. 4 each 0   latanoprost (XALATAN) 0.005 % ophthalmic solution SMARTSIG:In Eye(s)     levothyroxine (SYNTHROID) 25 MCG tablet TAKE ONE TABLET BY MOUTH DAILY BEFORE BREAKFAST 90 tablet 3   melatonin 3 MG TABS tablet Take 3 mg by mouth at bedtime as needed.     methylPREDNISolone (MEDROL DOSEPAK) 4 MG TBPK tablet As directed 21 tablet 0   Multiple Vitamin (MULTIVITAMIN) tablet Take 1 tablet by mouth daily.     pantoprazole (PROTONIX) 40 MG tablet TAKE ONE TABLET BY MOUTH DAILY 90 tablet 3   timolol (TIMOPTIC) 0.5 % ophthalmic solution Place 1 drop into both eyes daily.     traMADol (ULTRAM) 50 MG tablet Take 1 tablet (50 mg total) by mouth every 6 (six) hours as needed. 120 tablet 1   vitamin C (ASCORBIC ACID) 500 MG tablet Take 1,000 mg by mouth daily.      Current Facility-Administered Medications  Medication Dose Route Frequency Provider Last Rate Last Admin   incobotulinumtoxinA (XEOMIN) 100 units injection 100 Units  100 Units Intramuscular Once Levert Feinstein, MD       incobotulinumtoxinA (XEOMIN) 100 units injection 100 Units  100 Units Intramuscular Once Levert Feinstein, MD       incobotulinumtoxinA (XEOMIN) 100 units injection 100 Units  100 Units Intramuscular Q90 days Levert Feinstein, MD       incobotulinumtoxinA (XEOMIN) 100 units injection 200 Units  200 Units Intramuscular Q90 days Levert Feinstein, MD        PAST MEDICAL HISTORY: Past Medical History:  Diagnosis Date   Arthritis    Cancer (HCC) 13 years ago   bladder   GERD (gastroesophageal reflux disease)    Goiter    synthroid used to shrink   H/O hiatal hernia    Headache(784.0)    migraines in morning   PONV  (postoperative nausea and vomiting)    Seasonal allergies     PAST SURGICAL HISTORY: Past Surgical History:  Procedure Laterality Date   ARTHRODESIS FOOT WITH WEIL OSTEOTOMY Right 07/26/2014   Procedure: RIGHT HALLUX METATARSOPHALANGEAL JOINT ARTHRODESIS; RIGHT SECOND METATARSAL WEIL OSTEOTOMY; DORSAL CAPSULOTOMY EXTENSOR TENDON LENGTHENING;  Surgeon: Toni Arthurs, MD;  Location: [facility];  Service: Orthopedics;  Laterality: Right;   BLADDER SURGERY  13 years ago   BUNIONECTOMY Bilateral    COLONOSCOPY     LUMBAR FUSION  May 2014   L4-L5   ROTATOR CUFF REPAIR Bilateral ~2010, 2012   SPINAL FUSION     L1, L2   TONSILLECTOMY  as child   TOTAL KNEE ARTHROPLASTY Right 07/19/2012   Procedure: TOTAL KNEE ARTHROPLASTY;  Surgeon: Shelda Pal, MD;  Location: [facility];  Service: Orthopedics;  Laterality: Right;   TOTAL KNEE ARTHROPLASTY Left 08/01/2013   Procedure: LEFT TOTAL KNEE ARTHROPLASTY;  Surgeon: Shelda Pal, MD;  Location: [facility];  Service: Orthopedics;  Laterality: Left;    FAMILY HISTORY: Family History  Problem Relation Age of Onset   Cancer Brother 49       pancreatic   Brain cancer Father     SOCIAL HISTORY: Social History   Socioeconomic History   Marital status: Married    Spouse name: Not on file   Number of children: Not on file   Years of education: Not on file   Highest education level: Not on file  Occupational History   Not on file  Tobacco Use   Smoking status: Former    Current packs/day: 0.00    Average packs/day: 0.3 packs/day for 5.0 years (1.3 ttl pk-yrs)    Types: Cigarettes    Start date: 06/02/1959    Quit date: 06/01/1964    Years since quitting: 59.0   Smokeless tobacco: Never  Vaping Use   Vaping status: Never Used  Substance and Sexual Activity   Alcohol use: Yes    Comment: 1-2 times weekly   Drug use: No   Sexual activity: Not on file  Other Topics Concern   Not on file  Social History Narrative   Right handed    Caffeine-1 cup daily   Social Drivers of Health   Financial Resource Strain: Low Risk  (09/21/2022)   Overall Financial Resource Strain (CARDIA)    Difficulty of Paying Living Expenses: Not hard at all  Food Insecurity: No Food Insecurity (09/21/2022)   Hunger Vital Sign    Worried About Running Out of Food in the Last Year: Never true  Ran Out of Food in the Last Year: Never true  Transportation Needs: No Transportation Needs (09/21/2022)   PRAPARE - Administrator, Civil Service (Medical): No    Lack of Transportation (Non-Medical): No  Physical Activity: Insufficiently Active (09/21/2022)   Exercise Vital Sign    Days of Exercise per Week: 3 days    Minutes of Exercise per Session: 40 min  Stress: No Stress Concern Present (09/21/2022)   [employer] of Occupational Health - Occupational Stress Questionnaire    Feeling of Stress : Not at all  Social Connections: Unknown (09/21/2022)   Social Connection and Isolation Panel [NHANES]    Frequency of Communication with Friends and Family: Three times a week    Frequency of Social Gatherings with Friends and Family: More than three times a week    Attends Religious Services: Not on file    Active Member of Clubs or Organizations: Yes    Attends Banker Meetings: More than 4 times per year    Marital Status: Married  Catering manager Violence: Not At Risk (09/21/2022)   Humiliation, Afraid, Rape, and Kick questionnaire    Fear of Current or Ex-Partner: No    Emotionally Abused: No    Physically Abused: No    Sexually Abused: No      Levert Feinstein, M.D. Ph.D.  [facility] [address] [place], [place] [address] Ph: [phone] Fax: [phone]  CC:  Plotnikov, Georgina Quint, MD [address] [place],  [place] [address]  Plotnikov, Georgina Quint, MD

## 2023-06-17 DIAGNOSIS — M4310 Spondylolisthesis, site unspecified: Secondary | ICD-10-CM | POA: Diagnosis not present

## 2023-06-17 DIAGNOSIS — E785 Hyperlipidemia, unspecified: Secondary | ICD-10-CM | POA: Diagnosis not present

## 2023-06-17 DIAGNOSIS — Z981 Arthrodesis status: Secondary | ICD-10-CM | POA: Diagnosis not present

## 2023-06-17 DIAGNOSIS — E039 Hypothyroidism, unspecified: Secondary | ICD-10-CM | POA: Diagnosis not present

## 2023-06-17 DIAGNOSIS — M5416 Radiculopathy, lumbar region: Secondary | ICD-10-CM | POA: Diagnosis not present

## 2023-06-17 DIAGNOSIS — I10 Essential (primary) hypertension: Secondary | ICD-10-CM | POA: Diagnosis not present

## 2023-07-05 ENCOUNTER — Other Ambulatory Visit: Payer: Self-pay | Admitting: Internal Medicine

## 2023-07-05 ENCOUNTER — Telehealth: Payer: Self-pay

## 2023-07-05 ENCOUNTER — Other Ambulatory Visit (HOSPITAL_COMMUNITY): Payer: Self-pay

## 2023-07-05 NOTE — Telephone Encounter (Signed)
Pharmacy Patient Advocate Encounter   Received notification from Fax that prior authorization for DIAZEPAM 5MG  is required/requested.   Insurance verification completed.   The patient is insured through [facility] .   Per test claim: PA required; PA submitted to above mentioned insurance via CoverMyMeds Key/confirmation #/EOC [id number] Status is pending

## 2023-07-05 NOTE — Telephone Encounter (Signed)
Pharmacy Patient Advocate Encounter  Received notification from [facility] that Prior Authorization for DIAZEPAM 5MG   has been APPROVED from 06/02/23 to 08/04/23   PA #/Case ID/Reference #: [id number]

## 2023-07-06 DIAGNOSIS — M5416 Radiculopathy, lumbar region: Secondary | ICD-10-CM | POA: Diagnosis not present

## 2023-07-06 DIAGNOSIS — M4310 Spondylolisthesis, site unspecified: Secondary | ICD-10-CM | POA: Diagnosis not present

## 2023-07-06 DIAGNOSIS — R198 Other specified symptoms and signs involving the digestive system and abdomen: Secondary | ICD-10-CM | POA: Diagnosis not present

## 2023-07-06 DIAGNOSIS — M2569 Stiffness of other specified joint, not elsewhere classified: Secondary | ICD-10-CM | POA: Diagnosis not present

## 2023-07-29 DIAGNOSIS — M4310 Spondylolisthesis, site unspecified: Secondary | ICD-10-CM | POA: Diagnosis not present

## 2023-07-29 DIAGNOSIS — M2569 Stiffness of other specified joint, not elsewhere classified: Secondary | ICD-10-CM | POA: Diagnosis not present

## 2023-07-29 DIAGNOSIS — M5416 Radiculopathy, lumbar region: Secondary | ICD-10-CM | POA: Diagnosis not present

## 2023-07-29 DIAGNOSIS — R198 Other specified symptoms and signs involving the digestive system and abdomen: Secondary | ICD-10-CM | POA: Diagnosis not present

## 2023-07-30 ENCOUNTER — Other Ambulatory Visit: Payer: Self-pay | Admitting: Internal Medicine

## 2023-08-11 DIAGNOSIS — M549 Dorsalgia, unspecified: Secondary | ICD-10-CM | POA: Diagnosis not present

## 2023-08-11 DIAGNOSIS — Z981 Arthrodesis status: Secondary | ICD-10-CM | POA: Diagnosis not present

## 2023-08-11 DIAGNOSIS — M5416 Radiculopathy, lumbar region: Secondary | ICD-10-CM | POA: Diagnosis not present

## 2023-08-11 DIAGNOSIS — M4310 Spondylolisthesis, site unspecified: Secondary | ICD-10-CM | POA: Diagnosis not present

## 2023-08-16 ENCOUNTER — Encounter: Payer: Self-pay | Admitting: Internal Medicine

## 2023-08-16 ENCOUNTER — Ambulatory Visit (INDEPENDENT_AMBULATORY_CARE_PROVIDER_SITE_OTHER): Admitting: Internal Medicine

## 2023-08-16 ENCOUNTER — Ambulatory Visit (INDEPENDENT_AMBULATORY_CARE_PROVIDER_SITE_OTHER)

## 2023-08-16 VITALS — BP 120/60 | HR 80 | Temp 98.8°F | Ht 60.0 in | Wt 186.0 lb

## 2023-08-16 DIAGNOSIS — E039 Hypothyroidism, unspecified: Secondary | ICD-10-CM | POA: Diagnosis not present

## 2023-08-16 DIAGNOSIS — J069 Acute upper respiratory infection, unspecified: Secondary | ICD-10-CM

## 2023-08-16 DIAGNOSIS — M47816 Spondylosis without myelopathy or radiculopathy, lumbar region: Secondary | ICD-10-CM | POA: Diagnosis not present

## 2023-08-16 DIAGNOSIS — R079 Chest pain, unspecified: Secondary | ICD-10-CM | POA: Diagnosis not present

## 2023-08-16 DIAGNOSIS — R059 Cough, unspecified: Secondary | ICD-10-CM | POA: Diagnosis not present

## 2023-08-16 DIAGNOSIS — M545 Low back pain, unspecified: Secondary | ICD-10-CM

## 2023-08-16 DIAGNOSIS — R0989 Other specified symptoms and signs involving the circulatory and respiratory systems: Secondary | ICD-10-CM | POA: Diagnosis not present

## 2023-08-16 DIAGNOSIS — M48061 Spinal stenosis, lumbar region without neurogenic claudication: Secondary | ICD-10-CM | POA: Diagnosis not present

## 2023-08-16 MED ORDER — AZITHROMYCIN 250 MG PO TABS: ORAL_TABLET | ORAL | 0 refills | Status: DC

## 2023-08-16 MED ORDER — METHYLPREDNISOLONE 4 MG PO TBPK: ORAL_TABLET | ORAL | 0 refills | Status: DC

## 2023-08-16 MED ORDER — TRAMADOL HCL 50 MG PO TABS: 50.0000 mg | ORAL_TABLET | Freq: Four times a day (QID) | ORAL | 1 refills | Status: DC | PRN

## 2023-08-16 MED ORDER — HYDROCODONE BIT-HOMATROP MBR 5-1.5 MG/5ML PO SOLN: 5.0000 mL | ORAL | 0 refills | Status: DC | PRN

## 2023-08-16 MED ORDER — GABAPENTIN 300 MG PO CAPS: 300.0000 mg | ORAL_CAPSULE | Freq: Every day | ORAL | 3 refills | Status: AC

## 2023-08-16 MED ORDER — PANTOPRAZOLE SODIUM 40 MG PO TBEC: 40.0000 mg | DELAYED_RELEASE_TABLET | Freq: Every day | ORAL | 3 refills | Status: DC

## 2023-08-16 NOTE — Progress Notes (Signed)
 Subjective:  Patient ID: Mariah Farrell, female    DOB: [birth year]  Age: 83 y.o. MRN: [id number]  CC: Cough (Pt states she has a persistent wet cough that is getting worse a/w wheexing, no phlegm is coming up x1 week)   HPI Mariah Farrell presents for URI, cough x 1 week F/u on LBP, low thyroid   Outpatient Medications Prior to Visit  Medication Sig Dispense Refill   Biotin 5000 MCG TABS Take 1 tablet by mouth daily.     calcitonin, salmon, (MIACALCIN/FORTICAL) 200 UNIT/ACT nasal spray USE ONE SPRAY IN ONE NOSTRIL DAILY. ALTERNATE NOSTRILS 11.1 mL 11   calcium-vitamin D (OSCAL WITH D) 500-200 MG-UNIT per tablet Take 1 tablet by mouth daily.     cetirizine (ZYRTEC) 10 MG tablet Take 10 mg by mouth daily.     Cholecalciferol (VITAMIN D3) 2000 units capsule Take 1 capsule (2,000 Units total) by mouth daily. 100 capsule 3   diazepam (VALIUM) 5 MG tablet TAKE 1/2-1 TABLET BY MOUTH EVERY 12 HOURS AS NEEDED FOR ANXIETY, INSOMNIA 180 tablet 0   Diclofenac Sodium (VOLTAREN EX) Apply topically as needed.     DULoxetine (CYMBALTA) 60 MG capsule Take 1 capsule (60 mg total) by mouth daily. 90 capsule 3   estradiol (ESTRACE) 0.1 MG/GM vaginal cream Place 1 Applicatorful vaginally 2 (two) times a week.     furosemide (LASIX) 20 MG tablet Take 1 tablet by mouth daily.     gabapentin (NEURONTIN) 100 MG capsule Take 1 capsule (100 mg total) by mouth 3 (three) times daily as needed. 90 capsule 3   glucosamine-chondroitin 500-400 MG tablet Take 1 tablet by mouth daily.     incobotulinumtoxinA (XEOMIN) 100 units SOLR injection Inject 400 Units into the muscle every 3 (three) months. Inject 400 units into the muscle every 90 days. 4 each 0   latanoprost (XALATAN) 0.005 % ophthalmic solution SMARTSIG:In Eye(s)     levothyroxine (SYNTHROID) 25 MCG tablet TAKE 1 TABLET BY MOUTH DAILY BEFORE BREAKFAST 90 tablet 3   Multiple Vitamin (MULTIVITAMIN) tablet Take 1 tablet by mouth daily.     timolol  (TIMOPTIC) 0.5 % ophthalmic solution Place 1 drop into both eyes daily.     vitamin C (ASCORBIC ACID) 500 MG tablet Take 1,000 mg by mouth daily.      gabapentin (NEURONTIN) 300 MG capsule TAKE 1 CAPSULE BY MOUTH AT BEDTIME 90 capsule 3   pantoprazole (PROTONIX) 40 MG tablet TAKE ONE TABLET BY MOUTH DAILY 90 tablet 3   traMADol (ULTRAM) 50 MG tablet Take 1 tablet (50 mg total) by mouth every 6 (six) hours as needed. 120 tablet 1   celecoxib (CELEBREX) 200 MG capsule Take 1 capsule (200 mg total) by mouth daily as needed. (Patient not taking: Reported on 08/16/2023) 90 capsule 1   melatonin 3 MG TABS tablet Take 3 mg by mouth at bedtime as needed.     methylPREDNISolone (MEDROL DOSEPAK) 4 MG TBPK tablet As directed 21 tablet 0   Facility-Administered Medications Prior to Visit  Medication Dose Route Frequency Provider Last Rate Last Admin   incobotulinumtoxinA (XEOMIN) 100 units injection 100 Units  100 Units Intramuscular Q90 days Levert Feinstein, MD   100 Units at 06/23/23 1340    ROS: Review of Systems  Constitutional:  Negative for activity change, appetite change, chills, fatigue and unexpected weight change.  HENT:  Negative for congestion, mouth sores and sinus pressure.   Eyes:  Negative for visual disturbance.  Respiratory:  Negative for cough and chest tightness.   Gastrointestinal:  Negative for abdominal pain and nausea.  Genitourinary:  Negative for difficulty urinating, frequency and vaginal pain.  Musculoskeletal:  Positive for arthralgias, back pain and gait problem.  Skin:  Negative for pallor and rash.  Neurological:  Negative for dizziness, tremors, weakness, numbness and headaches.  Psychiatric/Behavioral:  Negative for confusion, sleep disturbance and suicidal ideas.     Objective:  BP 120/60   Pulse 80   Temp 98.8 F (37.1 C) (Oral)   Ht 5' (1.524 m)   Wt 186 lb (84.4 kg)   SpO2 93%   BMI 36.33 kg/m   BP Readings from Last 3 Encounters:  08/16/23 120/60   06/16/23 136/80  03/10/23 118/74    Wt Readings from Last 3 Encounters:  08/16/23 186 lb (84.4 kg)  03/08/23 186 lb 12.8 oz (84.7 kg)  09/21/22 188 lb (85.3 kg)    Physical Exam Constitutional:      General: She is not in acute distress.    Appearance: She is well-developed. She is obese.  HENT:     Head: Normocephalic.     Right Ear: External ear normal.     Left Ear: External ear normal.     Nose: Nose normal.  Eyes:     General:        Right eye: No discharge.        Left eye: No discharge.     Conjunctiva/sclera: Conjunctivae normal.     Pupils: Pupils are equal, round, and reactive to light.  Neck:     Thyroid: No thyromegaly.     Vascular: No JVD.     Trachea: No tracheal deviation.  Cardiovascular:     Rate and Rhythm: Normal rate and regular rhythm.     Heart sounds: Normal heart sounds.  Pulmonary:     Effort: No respiratory distress.     Breath sounds: No stridor. No wheezing.  Abdominal:     General: Bowel sounds are normal. There is no distension.     Palpations: Abdomen is soft. There is no mass.     Tenderness: There is no abdominal tenderness. There is no guarding or rebound.  Musculoskeletal:        General: Tenderness present.     Cervical back: Normal range of motion and neck supple. No rigidity.     Right lower leg: No edema.     Left lower leg: No edema.  Lymphadenopathy:     Cervical: No cervical adenopathy.  Skin:    Findings: No erythema or rash.  Neurological:     Cranial Nerves: No cranial nerve deficit.     Motor: No abnormal muscle tone.     Coordination: Coordination normal.     Gait: Gait abnormal.     Deep Tendon Reflexes: Reflexes normal.  Psychiatric:        Behavior: Behavior normal.        Thought Content: Thought content normal.        Judgment: Judgment normal.     Lab Results  Component Value Date   WBC 4.3 03/08/2023   HGB 12.3 03/08/2023   HCT 37.3 03/08/2023   PLT 272.0 03/08/2023   GLUCOSE 91 03/08/2023    CHOL 199 06/24/2022   TRIG 144.0 06/24/2022   HDL 55.00 06/24/2022   LDLCALC 115 (H) 06/24/2022   ALT 16 03/08/2023   AST 23 03/08/2023   NA 139 03/08/2023   K 4.2 03/08/2023   CL  103 03/08/2023   CREATININE 1.17 03/08/2023   BUN 22 03/08/2023   CO2 28 03/08/2023   TSH 0.83 06/24/2022   INR 1.0 04/10/2021    US Abdomen Complete Result Date: 03/19/2023 CLINICAL DATA:  Right abdomen pain and right flank pain. EXAM: ABDOMEN ULTRASOUND COMPLETE COMPARISON:  April 07, 2016 FINDINGS: Gallbladder: No gallstones or wall thickening visualized. No sonographic Murphy sign noted by sonographer. Common bile duct: Diameter: 4 mm Liver: Simple cysts identified within the liver, largest measures 2 x 1.8 x 1.6 cm. Within normal limits in parenchymal echogenicity. Portal vein is patent on color Doppler imaging with normal direction of blood flow towards the liver. IVC: No abnormality visualized. Pancreas: Visualized portion unremarkable. Spleen: Size and appearance within normal limits. Right Kidney: Length: 9.6 cm. Echogenicity within normal limits. No mass or hydronephrosis visualized. Left Kidney: Length: 10 cm. Echogenicity within normal limits. No mass or hydronephrosis visualized. Abdominal aorta: No aneurysm visualized. Other findings: None. IMPRESSION: No acute abnormality identified. Electronically Signed   By: Sherian Rein M.D.   On: 03/19/2023 10:14    Assessment & Plan:   Problem List Items Addressed This Visit     Lumbar back pain - Primary   Blue-Emu cream was recommended to use 2-3 times a day Celebrex qd Tramadol, Gabapentin prn Check abd [place]      Relevant Medications   methylPREDNISolone (MEDROL DOSEPAK) 4 MG TBPK tablet   traMADol (ULTRAM) 50 MG tablet   Upper respiratory infection   CXR Zpack Hycodan syr prn      Relevant Medications   azithromycin (ZITHROMAX Z-PAK) 250 MG tablet   Other Relevant Orders   DG Chest 2 View   Hypothyroidism (acquired)   On  Levothyroxine         Meds ordered this encounter  Medications   HYDROcodone bit-homatropine (HYCODAN) 5-1.5 MG/5ML syrup    Sig: Take 5 mLs by mouth every 4 (four) hours as needed for cough.    Dispense:  240 mL    Refill:  0   azithromycin (ZITHROMAX Z-PAK) 250 MG tablet    Sig: As directed    Dispense:  6 tablet    Refill:  0   methylPREDNISolone (MEDROL DOSEPAK) 4 MG TBPK tablet    Sig: As directed    Dispense:  21 tablet    Refill:  0   gabapentin (NEURONTIN) 300 MG capsule    Sig: Take 1 capsule (300 mg total) by mouth at bedtime.    Dispense:  90 capsule    Refill:  3   pantoprazole (PROTONIX) 40 MG tablet    Sig: Take 1 tablet (40 mg total) by mouth daily.    Dispense:  90 tablet    Refill:  3   traMADol (ULTRAM) 50 MG tablet    Sig: Take 1 tablet (50 mg total) by mouth every 6 (six) hours as needed.    Dispense:  120 tablet    Refill:  1      Follow-up: Return in about 6 months (around 02/16/2024) for a follow-up visit.  Sonda Primes, MD

## 2023-08-16 NOTE — Assessment & Plan Note (Signed)
Blue-Emu cream was recommended to use 2-3 times a day Celebrex qd Tramadol, Gabapentin prn Check abd [place]

## 2023-08-16 NOTE — Assessment & Plan Note (Signed)
 On Levothyroxine

## 2023-08-16 NOTE — Assessment & Plan Note (Signed)
 CXR Zpack Hycodan syr prn

## 2023-08-17 ENCOUNTER — Encounter: Payer: Self-pay | Admitting: Internal Medicine

## 2023-08-25 ENCOUNTER — Other Ambulatory Visit: Payer: Self-pay

## 2023-08-30 ENCOUNTER — Telehealth: Payer: Self-pay | Admitting: Neurology

## 2023-08-30 NOTE — Telephone Encounter (Signed)
 Pt rescheduled Botox appointment to having surgery.

## 2023-08-30 NOTE — Telephone Encounter (Signed)
 Pt said [facility] has been recall on DULoxetine (CYMBALTA) 60 MG capsule. Would like a call back to discuss what to do.

## 2023-08-30 NOTE — Telephone Encounter (Signed)
 Call to patient, no answer, advised to call back.

## 2023-09-01 NOTE — Telephone Encounter (Signed)
 2nd attempt by hf 09/01/23

## 2023-09-04 ENCOUNTER — Telehealth: Payer: Self-pay | Admitting: Internal Medicine

## 2023-09-04 MED ORDER — DIPHENHYDRAMINE-PE-APAP 12.5-5-325 MG PO TABS: 1.0000 | ORAL_TABLET | Freq: Four times a day (QID) | ORAL | 1 refills | Status: AC | PRN

## 2023-09-04 MED ORDER — HYDROCODONE BIT-HOMATROP MBR 5-1.5 MG/5ML PO SOLN
5.0000 mL | Freq: Three times a day (TID) | ORAL | 0 refills | Status: DC | PRN
Start: 2023-09-04 — End: 2023-10-01

## 2023-09-04 MED ORDER — PANTOPRAZOLE SODIUM 40 MG PO TBEC
40.0000 mg | DELAYED_RELEASE_TABLET | Freq: Two times a day (BID) | ORAL | 3 refills | Status: AC
Start: 2023-09-04 — End: ?

## 2023-09-04 NOTE — Telephone Encounter (Signed)
 Cough is not better - see Rx pelvic ultrasound,lm ref if needed

## 2023-09-06 ENCOUNTER — Ambulatory Visit (INDEPENDENT_AMBULATORY_CARE_PROVIDER_SITE_OTHER): Payer: Medicare HMO

## 2023-09-06 ENCOUNTER — Ambulatory Visit (INDEPENDENT_AMBULATORY_CARE_PROVIDER_SITE_OTHER): Admitting: Emergency Medicine

## 2023-09-06 ENCOUNTER — Encounter: Payer: Self-pay | Admitting: Emergency Medicine

## 2023-09-06 VITALS — Ht 60.0 in | Wt 186.0 lb

## 2023-09-06 VITALS — BP 110/68 | HR 83 | Temp 98.0°F | Ht 60.0 in | Wt 192.0 lb

## 2023-09-06 DIAGNOSIS — R053 Chronic cough: Secondary | ICD-10-CM

## 2023-09-06 DIAGNOSIS — B9789 Other viral agents as the cause of diseases classified elsewhere: Secondary | ICD-10-CM | POA: Insufficient documentation

## 2023-09-06 DIAGNOSIS — Z1211 Encounter for screening for malignant neoplasm of colon: Secondary | ICD-10-CM | POA: Diagnosis not present

## 2023-09-06 DIAGNOSIS — Z0000 Encounter for general adult medical examination without abnormal findings: Secondary | ICD-10-CM | POA: Diagnosis not present

## 2023-09-06 DIAGNOSIS — J988 Other specified respiratory disorders: Secondary | ICD-10-CM | POA: Diagnosis not present

## 2023-09-06 DIAGNOSIS — Z1212 Encounter for screening for malignant neoplasm of rectum: Secondary | ICD-10-CM | POA: Diagnosis not present

## 2023-09-06 NOTE — Patient Instructions (Signed)

## 2023-09-06 NOTE — Patient Instructions (Signed)
 Mariah Farrell , Thank you for taking time to come for your Medicare Wellness Visit. I appreciate your ongoing commitment to your health goals. Please review the following plan we discussed and let me know if I can assist you in the future.   Referrals/Orders/Follow-Ups/Clinician Recommendations: It was nice talking to you today.  You are due for a Cologuard.  Please watch out for the kit in the mail.    This is a list of the screening recommended for you and due dates:  Health Maintenance  Topic Date Due   COVID-19 Vaccine (7 - 2024-25 season) 01/31/2023   Flu Shot  12/31/2023   Medicare Annual Wellness Visit  09/05/2024   DTaP/Tdap/Td vaccine (3 - Td or Tdap) 03/04/2026   Pneumonia Vaccine  Completed   DEXA scan (bone density measurement)  Completed   Zoster (Shingles) Vaccine  Completed   HPV Vaccine  Aged Out    Advanced directives: (Copy Requested) Please bring a copy of your health care power of attorney and living will to the office to be added to your chart at your convenience. You can mail to [facility] [address] [place], [place] [address] or email to [email or web address]  Next Medicare Annual Wellness Visit scheduled for next year: Yes

## 2023-09-06 NOTE — Assessment & Plan Note (Signed)
 Cough management discussed. Recommend over-the-counter Mucinex DM and cough drops Clinical infection running its course without complications Symptom management discussed

## 2023-09-06 NOTE — Assessment & Plan Note (Signed)
 Clinically stable.  No red flag signs or symptoms. Running its course without complications. Symptom management discussed. No concerns.

## 2023-09-06 NOTE — Progress Notes (Signed)
 Mariah Farrell 83 y.o.   Chief Complaint  Patient presents with   Cough    Patient still has a cough. She states seeing Dr. Macario Golds 2 weeks ago for the cough and was given cough medication but seems to not help and has gotten worse. She states having this cough for 3 weeks and has back surgery Monday and is afraid they wont do her surgery because of the cough.     HISTORY OF PRESENT ILLNESS: Acute problem visit today.  Patient of Dr. Trinna Post Plotnikov. This is a 83 y.o. female complaining of persistent dry cough that started about 3 to 4 weeks ago Was given antibiotics.  No significant difference. Chest x-ray on 08/16/2023.  Unremarkable. Slowly getting better. Scheduled for lumbar spine fusion next Monday.  Evaluation by anesthesiologist in 2 days. No other complaints or medical concerns today.  Cough Pertinent negatives include no chest pain, chills, fever, headaches, rash, sore throat or shortness of breath.     Prior to Admission medications   Medication Sig Start Date End Date Taking? Authorizing Provider  Biotin 5000 MCG TABS Take 1 tablet by mouth daily.   Yes [provider]  calcitonin, salmon, (MIACALCIN/FORTICAL) 200 UNIT/ACT nasal spray USE ONE SPRAY IN ONE NOSTRIL DAILY. ALTERNATE NOSTRILS 07/05/23  Yes Plotnikov, Georgina Quint, MD  calcium-vitamin D (OSCAL WITH D) 500-200 MG-UNIT per tablet Take 1 tablet by mouth daily.   Yes [provider]  cetirizine (ZYRTEC) 10 MG tablet Take 10 mg by mouth daily.   Yes [provider]  Cholecalciferol (VITAMIN D3) 2000 units capsule Take 1 capsule (2,000 Units total) by mouth daily. 03/24/17  Yes Plotnikov, Georgina Quint, MD  diazepam (VALIUM) 5 MG tablet TAKE 1/2-1 TABLET BY MOUTH EVERY 12 HOURS AS NEEDED FOR ANXIETY, INSOMNIA 07/05/23  Yes Plotnikov, Georgina Quint, MD  Diclofenac Sodium (VOLTAREN EX) Apply topically as needed.   Yes [provider]  diphenhydrAMINE-PE-APAP 12.5-5-325 MG TABS Take 1 tablet by  mouth 4 (four) times daily as needed. 09/04/23  Yes Plotnikov, Georgina Quint, MD  DULoxetine (CYMBALTA) 60 MG capsule Take 1 capsule (60 mg total) by mouth daily. 12/07/22  Yes Levert Feinstein, MD  estradiol (ESTRACE) 0.1 MG/GM vaginal cream Place 1 Applicatorful vaginally 2 (two) times a week. 04/03/22  Yes [provider]  furosemide (LASIX) 20 MG tablet Take 1 tablet by mouth daily. 03/26/22  Yes [provider]  gabapentin (NEURONTIN) 300 MG capsule Take 1 capsule (300 mg total) by mouth at bedtime. 08/16/23  Yes Plotnikov, Georgina Quint, MD  glucosamine-chondroitin 500-400 MG tablet Take 1 tablet by mouth daily.   Yes [provider]  HYDROcodone bit-homatropine (HYCODAN) 5-1.5 MG/5ML syrup Take 5 mLs by mouth every 8 (eight) hours as needed. 09/04/23  Yes Plotnikov, Georgina Quint, MD  incobotulinumtoxinA (XEOMIN) 100 units SOLR injection Inject 400 Units into the muscle every 3 (three) months. Inject 400 units into the muscle every 90 days. 04/13/22  Yes Levert Feinstein, MD  latanoprost (XALATAN) 0.005 % ophthalmic solution SMARTSIG:In Eye(s) 10/03/21  Yes [provider]  levothyroxine (SYNTHROID) 25 MCG tablet TAKE 1 TABLET BY MOUTH DAILY BEFORE BREAKFAST 07/30/23  Yes Plotnikov, Georgina Quint, MD  Multiple Vitamin (MULTIVITAMIN) tablet Take 1 tablet by mouth daily.   Yes [provider]  pantoprazole (PROTONIX) 40 MG tablet Take 1 tablet (40 mg total) by mouth 2 (two) times daily. 09/04/23  Yes Plotnikov, Georgina Quint, MD  timolol (TIMOPTIC) 0.5 % ophthalmic solution Place 1 drop  into both eyes daily. 12/10/22  Yes [provider]  traMADol (ULTRAM) 50 MG tablet Take 1 tablet (50 mg total) by mouth every 6 (six) hours as needed. 08/16/23  Yes Plotnikov, Georgina Quint, MD  vitamin C (ASCORBIC ACID) 500 MG tablet Take 1,000 mg by mouth daily.    Yes [provider]  gabapentin (NEURONTIN) 100 MG capsule Take 1 capsule (100 mg total) by mouth 3 (three) times daily as  needed. Patient not taking: Reported on 09/06/2023 10/29/22   Plotnikov, Georgina Quint, MD  methylPREDNISolone (MEDROL DOSEPAK) 4 MG TBPK tablet As directed Patient not taking: Reported on 09/06/2023 08/16/23   Plotnikov, Georgina Quint, MD    Allergies  Allergen Reactions   Doxycycline Hives   Crab [Shellfish Allergy]    Penicillins    Prolia [Denosumab]     Jaw necrosis wound   Cephalexin Rash    Patient Active Problem List   Diagnosis Date Noted   Fusion of spine of lumbar region 05/05/2023   Encounter for other orthopedic aftercare 03/26/2023   Degenerative spondylolisthesis 03/22/2023   Inflammatory pain 02/09/2023   Cervical dystonia 12/07/2022   Neck pain 12/07/2022   Age-related osteoporosis without current pathological fracture 06/03/2022   Chronic nonintractable headache 05/11/2022   Cervico-occipital neuralgia 04/16/2022   Facet arthropathy, cervical 04/03/2022   Furuncle of vulva 09/22/2021   History of COVID-19 09/22/2021   Post-COVID chronic cough 09/22/2021   Cramp in limb 06/23/2021   Arthritis of hand 05/15/2021   Pain of right thumb 05/14/2021   Hemoptysis 04/10/2021   Bleeding from mouth 04/10/2021   DOE (dyspnea on exertion) 06/20/2020   Ingrown toenail of right foot 06/20/2020   Pain in left foot 06/18/2020   Right cervical radiculopathy 05/06/2020   DDD (degenerative disc disease), cervical 05/06/2020   Situational depression 01/04/2020   Renal insufficiency 05/10/2019   Somatic dysfunction of right sacroiliac joint 02/27/2019   Lumbar spondylosis 08/04/2018   Vitamin D deficiency 07/17/2018   Essential hypertension 06/18/2018   Gastroesophageal reflux disease without esophagitis 06/18/2018   Hypothyroidism (acquired) 06/18/2018   Mixed hyperlipidemia 06/18/2018   Severe protein-calorie malnutrition (HCC) 06/18/2018   History of lumbar fusion 06/14/2018   Breast mass, right 08/10/2017   Lumbar post-laminectomy syndrome 06/15/2017   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   Stress at home 03/24/2017   Pre-operative examination for internal medicine 10/06/2016   RLQ abdominal pain 05/16/2016   Need for prophylactic vaccination and inoculation against influenza 03/04/2016   Allergic rhinitis 02/20/2014   Upper respiratory infection 12/18/2013   Osteoporosis 12/18/2013   Expected blood loss anemia 07/20/2012   Obesity, unspecified 07/20/2012   S/P left TKA 07/19/2012   Tinnitus 09/28/2011   Well adult exam 09/28/2011   Knee pain 09/28/2011   Goiter 04/18/2009   TOBACCO USE, QUIT 04/18/2009   Osteoarthritis 04/11/2007   Lumbar back pain 04/11/2007   History of colonic polyps 04/11/2007    Past Medical History:  Diagnosis Date   Arthritis    Cancer (HCC) 13 years ago   bladder   GERD (gastroesophageal reflux disease)    Goiter    synthroid used to shrink   H/O hiatal hernia    Headache(784.0)    migraines in morning   PONV (postoperative nausea and vomiting)    Seasonal allergies     Past Surgical History:  Procedure Laterality Date   ARTHRODESIS FOOT WITH WEIL OSTEOTOMY Right 07/26/2014   Procedure: RIGHT HALLUX METATARSOPHALANGEAL JOINT ARTHRODESIS; RIGHT SECOND  METATARSAL WEIL OSTEOTOMY; DORSAL CAPSULOTOMY EXTENSOR TENDON LENGTHENING;  Surgeon: Toni Arthurs, MD;  Location: [facility];  Service: Orthopedics;  Laterality: Right;   BLADDER SURGERY  13 years ago   BUNIONECTOMY Bilateral    COLONOSCOPY     LUMBAR FUSION  May 2014   L4-L5   ROTATOR CUFF REPAIR Bilateral ~2010, 2012   SPINAL FUSION     L1, L2   TONSILLECTOMY  as child   TOTAL KNEE ARTHROPLASTY Right 07/19/2012   Procedure: TOTAL KNEE ARTHROPLASTY;  Surgeon: Shelda Pal, MD;  Location: [facility];  Service: Orthopedics;  Laterality: Right;   TOTAL KNEE ARTHROPLASTY Left 08/01/2013   Procedure: LEFT TOTAL KNEE ARTHROPLASTY;  Surgeon: Shelda Pal, MD;  Location: [facility];  Service: Orthopedics;  Laterality: Left;     Social History   Socioeconomic History   Marital status: Married    Spouse name: Philp   Number of children: 4   Years of education: Not on file   Highest education level: Some college, no degree  Occupational History   Occupation: RETIRED  Tobacco Use   Smoking status: Former    Current packs/day: 0.00    Average packs/day: 0.3 packs/day for 5.0 years (1.3 ttl pk-yrs)    Types: Cigarettes    Start date: 06/02/1959    Quit date: 06/01/1964    Years since quitting: 59.3   Smokeless tobacco: Never  Vaping Use   Vaping status: Never Used  Substance and Sexual Activity   Alcohol use: Yes    Comment: 1-2 times weekly   Drug use: No   Sexual activity: Not on file  Other Topics Concern   Not on file  Social History Narrative   Right handed   Caffeine-1 cup daily      Husband is a Dr.  She lives at home with husband and 1 daughter   Social Drivers of Corporate investment banker Strain: Low Risk  (09/03/2023)   Overall Financial Resource Strain (CARDIA)    Difficulty of Paying Living Expenses: Not hard at all  Food Insecurity: No Food Insecurity (09/03/2023)   Hunger Vital Sign    Worried About Running Out of Food in the Last Year: Never true    Ran Out of Food in the Last Year: Never true  Transportation Needs: No Transportation Needs (09/03/2023)   PRAPARE - Administrator, Civil Service (Medical): No    Lack of Transportation (Non-Medical): No  Physical Activity: Inactive (09/03/2023)   Exercise Vital Sign    Days of Exercise per Week: 0 days    Minutes of Exercise per Session: 40 min  Stress: No Stress Concern Present (09/03/2023)   [employer] of Occupational Health - Occupational Stress Questionnaire    Feeling of Stress : Not at all  Social Connections: Socially Integrated (09/03/2023)   Social Connection and Isolation Panel [NHANES]    Frequency of Communication with Friends and Family: Twice a week    Frequency of Social Gatherings with Friends and  Family: Three times a week    Attends Religious Services: More than 4 times per year    Active Member of Clubs or Organizations: Yes    Attends Banker Meetings: More than 4 times per year    Marital Status: Married  Catering manager Violence: Not At Risk (09/06/2023)   Humiliation, Afraid, Rape, and Kick questionnaire    Fear of Current or Ex-Partner: No    Emotionally Abused: No  Physically Abused: No    Sexually Abused: No    Family History  Problem Relation Age of Onset   Cancer Brother 31       pancreatic   Brain cancer Father      Review of Systems  Constitutional: Negative.  Negative for chills and fever.  HENT: Negative.  Negative for congestion and sore throat.   Respiratory:  Positive for cough. Negative for sputum production and shortness of breath.   Cardiovascular: Negative.  Negative for chest pain and palpitations.  Gastrointestinal:  Negative for abdominal pain, nausea and vomiting.  Genitourinary: Negative.  Negative for dysuria and hematuria.  Skin: Negative.  Negative for rash.  Neurological:  Negative for dizziness and headaches.  All other systems reviewed and are negative.   Vitals:   09/06/23 1500  BP: 110/68  Pulse: 83  Temp: 98 F (36.7 C)  SpO2: 93%    Physical Exam Vitals reviewed.  Constitutional:      Appearance: Normal appearance.  HENT:     Head: Normocephalic.     Mouth/Throat:     Mouth: Mucous membranes are moist.     Pharynx: Oropharynx is clear.  Eyes:     Extraocular Movements: Extraocular movements intact.     Conjunctiva/sclera: Conjunctivae normal.     Pupils: Pupils are equal, round, and reactive to light.  Cardiovascular:     Rate and Rhythm: Normal rate and regular rhythm.     Pulses: Normal pulses.     Heart sounds: Normal heart sounds.  Pulmonary:     Effort: Pulmonary effort is normal.     Breath sounds: Normal breath sounds.  Musculoskeletal:     Cervical back: No tenderness.  Lymphadenopathy:      Cervical: No cervical adenopathy.  Skin:    General: Skin is warm and dry.     Capillary Refill: Capillary refill takes less than 2 seconds.  Neurological:     General: No focal deficit present.     Mental Status: She is alert and oriented to person, place, and time.  Psychiatric:        Mood and Affect: Mood normal.        Behavior: Behavior normal.      ASSESSMENT & PLAN: A total of 33 minutes was spent with the patient and counseling/coordination of care regarding preparing for this visit, review of most recent office notes, review of multiple chronic medical conditions under management, review of all medications, diagnosis of upper viral respiratory infection, symptom management, prognosis, documentation and need for follow-up if no better or worse during the next several days..  Problem List Items Addressed This Visit       Respiratory   Viral respiratory infection - Primary   Clinically stable.  No red flag signs or symptoms. Running its course without complications. Symptom management discussed. No concerns.        Other   Persistent cough   Cough management discussed. Recommend over-the-counter Mucinex DM and cough drops Clinical infection running its course without complications Symptom management discussed      Patient Instructions  Cough, Adult A cough helps to clear your throat and lungs. It may be a sign of an illness or another condition. A short-term (acute) cough may last 2-3 weeks. A long-term (chronic) cough may last 8 or more weeks. Many things can cause a cough. They include: Illnesses such as: An infection in your throat or lungs. Asthma or other heart or lung problems. Gastroesophageal reflux. This is  when acid comes back up from your stomach. Breathing in things that bother (irritate) your lungs. Allergies. Postnasal drip. This is when mucus runs down the back of your throat. Smoking. Some medicines. Follow these instructions at  home: Medicines Take over-the-counter and prescription medicines only as told by your doctor. Talk with your doctor before you take cough medicine (cough suppressants). Eating and drinking Do not drink alcohol. Do not drink caffeine. Drink enough fluid to keep your pee (urine) pale yellow. Lifestyle Stay away from cigarette smoke. Do not smoke or use any products that contain nicotine or tobacco. If you need help quitting, ask your doctor. Stay away from things that make you cough. These may include perfume, candles, cleaning products, or campfire smoke. General instructions  Watch for any changes to your cough. Tell your doctor about them. Always cover your mouth when you cough. If the air is dry in your home, use a cool mist vaporizer or humidifier. If your cough is worse at night, try using extra pillows to raise your head up higher while you sleep. Rest as needed. Contact a doctor if: You have new symptoms. Your symptoms get worse. You cough up pus. You have a fever that does not go away. Your cough does not get better after 2-3 weeks. Cough medicine does not help, and you are not sleeping well. You have pain that gets worse or is not helped with medicine. You are losing weight and do not know why. You have night sweats. Get help right away if: You cough up blood. You have trouble breathing. Your heart is beating very fast. These symptoms may be an emergency. Get help right away. Call 911. Do not wait to see if the symptoms will go away. Do not drive yourself to the hospital. This information is not intended to replace advice given to you by your health care provider. Make sure you discuss any questions you have with your health care provider. Document Revised: 01/16/2022 Document Reviewed: 01/16/2022 Elsevier Patient Education  2024 Elsevier Inc[employer]     Edwina Barth, MD [facility]

## 2023-09-06 NOTE — Progress Notes (Cosign Needed Addendum)
 Subjective:   Mariah Farrell is a 83 y.o. who presents for a Medicare Wellness preventive visit.  Visit Complete: Virtual I connected with  Mariah Farrell on 09/06/23 by a video and audio enabled telemedicine application and verified that I am speaking with the correct person using two identifiers.  Patient Location: Home  Provider Location: Home Office  I discussed the limitations of evaluation and management by telemedicine. The patient expressed understanding and agreed to proceed.  Vital Signs: Because this visit was a virtual/telehealth visit, some criteria may be missing or patient reported. Any vitals not documented were not able to be obtained and vitals that have been documented are patient reported.    Persons Participating in Visit: Patient.  AWV Questionnaire: Yes: Patient Medicare AWV questionnaire was completed by the patient on 09/03/2023; I have confirmed that all information answered by patient is correct and no changes since this date.  Cardiac Risk Factors include: advanced age (>13men, >24 women)     Objective:    Today's Vitals   09/06/23 1132 09/06/23 1133  Weight: 186 lb (84.4 kg)   Height: 5' (1.524 m)   PainSc:  6    Body mass index is 36.33 kg/m.     09/06/2023   11:39 AM 09/21/2022    3:20 PM 10/06/2021    3:38 PM 09/30/2020   11:58 AM 09/26/2020   11:50 AM 09/10/2015    1:33 PM 07/26/2014   10:31 AM  Advanced Directives  Does Patient Have a Medical Advance Directive? Yes Yes Yes Yes Yes Yes Yes  Type of Estate agent of [place];Living will Healthcare Power of [place];Living will Living will;Healthcare Power of Attorney Living will;Healthcare Power of Attorney Living will;Healthcare Power of Attorney    Does patient want to make changes to medical advance directive?   No - Patient declined No - Patient declined No - Patient declined    Copy of Healthcare Power of Attorney in Chart? No - copy requested No - copy  requested No - copy requested No - copy requested No - copy requested      Current Medications (verified) Outpatient Encounter Medications as of 09/06/2023  Medication Sig   Biotin 5000 MCG TABS Take 1 tablet by mouth daily.   calcitonin, salmon, (MIACALCIN/FORTICAL) 200 UNIT/ACT nasal spray USE ONE SPRAY IN ONE NOSTRIL DAILY. ALTERNATE NOSTRILS   calcium-vitamin D (OSCAL WITH D) 500-200 MG-UNIT per tablet Take 1 tablet by mouth daily.   cetirizine (ZYRTEC) 10 MG tablet Take 10 mg by mouth daily.   Cholecalciferol (VITAMIN D3) 2000 units capsule Take 1 capsule (2,000 Units total) by mouth daily.   diazepam (VALIUM) 5 MG tablet TAKE 1/2-1 TABLET BY MOUTH EVERY 12 HOURS AS NEEDED FOR ANXIETY, INSOMNIA   Diclofenac Sodium (VOLTAREN EX) Apply topically as needed.   diphenhydrAMINE-PE-APAP 12.5-5-325 MG TABS Take 1 tablet by mouth 4 (four) times daily as needed.   DULoxetine (CYMBALTA) 60 MG capsule Take 1 capsule (60 mg total) by mouth daily.   estradiol (ESTRACE) 0.1 MG/GM vaginal cream Place 1 Applicatorful vaginally 2 (two) times a week.   furosemide (LASIX) 20 MG tablet Take 1 tablet by mouth daily.   gabapentin (NEURONTIN) 100 MG capsule Take 1 capsule (100 mg total) by mouth 3 (three) times daily as needed.   gabapentin (NEURONTIN) 300 MG capsule Take 1 capsule (300 mg total) by mouth at bedtime.   glucosamine-chondroitin 500-400 MG tablet Take 1 tablet by mouth daily.   HYDROcodone bit-homatropine (  HYCODAN) 5-1.5 MG/5ML syrup Take 5 mLs by mouth every 8 (eight) hours as needed.   incobotulinumtoxinA (XEOMIN) 100 units SOLR injection Inject 400 Units into the muscle every 3 (three) months. Inject 400 units into the muscle every 90 days.   latanoprost (XALATAN) 0.005 % ophthalmic solution SMARTSIG:In Eye(s)   levothyroxine (SYNTHROID) 25 MCG tablet TAKE 1 TABLET BY MOUTH DAILY BEFORE BREAKFAST   methylPREDNISolone (MEDROL DOSEPAK) 4 MG TBPK tablet As directed   Multiple Vitamin (MULTIVITAMIN)  tablet Take 1 tablet by mouth daily.   pantoprazole (PROTONIX) 40 MG tablet Take 1 tablet (40 mg total) by mouth 2 (two) times daily.   timolol (TIMOPTIC) 0.5 % ophthalmic solution Place 1 drop into both eyes daily.   traMADol (ULTRAM) 50 MG tablet Take 1 tablet (50 mg total) by mouth every 6 (six) hours as needed.   vitamin C (ASCORBIC ACID) 500 MG tablet Take 1,000 mg by mouth daily.    Facility-Administered Encounter Medications as of 09/06/2023  Medication   incobotulinumtoxinA (XEOMIN) 100 units injection 100 Units    Allergies (verified) Doxycycline, Crab [shellfish allergy], Penicillins, Prolia [denosumab], and Cephalexin   History: Past Medical History:  Diagnosis Date   Arthritis    Cancer (HCC) 13 years ago   bladder   GERD (gastroesophageal reflux disease)    Goiter    synthroid used to shrink   H/O hiatal hernia    Headache(784.0)    migraines in morning   PONV (postoperative nausea and vomiting)    Seasonal allergies    Past Surgical History:  Procedure Laterality Date   ARTHRODESIS FOOT WITH WEIL OSTEOTOMY Right 07/26/2014   Procedure: RIGHT HALLUX METATARSOPHALANGEAL JOINT ARTHRODESIS; RIGHT SECOND METATARSAL WEIL OSTEOTOMY; DORSAL CAPSULOTOMY EXTENSOR TENDON LENGTHENING;  Surgeon: Toni Arthurs, MD;  Location:  SUR[facility]ERY [facility]ENTER;  Service: Orthopedics;  Laterality: Right;   BLADDER SURGERY  13 years ago   BUNIONECTOMY Bilateral    COLONOSCOPY     LUMBAR FUSION  May 2014   L4-L5   ROTATOR CUFF REPAIR Bilateral ~2010, 2012   SPINAL FUSION     L1, L2   TONSILLECTOMY  as child   TOTAL KNEE ARTHROPLASTY Right 07/19/2012   Procedure: TOTAL KNEE ARTHROPLASTY;  Surgeon: Shelda Pal, MD;  Location: [facility];  Service: Orthopedics;  Laterality: Right;   TOTAL KNEE ARTHROPLASTY Left 08/01/2013   Procedure: LEFT TOTAL KNEE ARTHROPLASTY;  Surgeon: Shelda Pal, MD;  Location: [facility];  Service: Orthopedics;  Laterality: Left;   Family History  Problem Relation Age  of Onset   Cancer Brother 73       pancreatic   Brain cancer Father    Social History   Socioeconomic History   Marital status: Married    Spouse name: Mariah Farrell   Number of children: 4   Years of education: Not on file   Highest education level: Some college, no degree  Occupational History   Occupation: RETIRED  Tobacco Use   Smoking status: Former    Current packs/day: 0.00    Average packs/day: 0.3 packs/day for 5.0 years (1.3 ttl pk-yrs)    Types: Cigarettes    Start date: 06/02/1959    Quit date: 06/01/1964    Years since quitting: 59.3   Smokeless tobacco: Never  Vaping Use   Vaping status: Never Used  Substance and Sexual Activity   Alcohol use: Yes    Comment: 1-2 times weekly   Drug use: No   Sexual activity: Not on file  Other Topics Concern   Not on file  Social History Narrative   Right handed   Caffeine-1 cup daily      Husband is a Dr.  She lives at home with husband and 1 daughter   Social Drivers of Corporate investment banker Strain: Low Risk  (09/03/2023)   Overall Financial Resource Strain (CARDIA)    Difficulty of Paying Living Expenses: Not hard at all  Food Insecurity: No Food Insecurity (09/03/2023)   Hunger Vital Sign    Worried About Running Out of Food in the Last Year: Never true    Ran Out of Food in the Last Year: Never true  Transportation Needs: No Transportation Needs (09/03/2023)   PRAPARE - Administrator, Civil Service (Medical): No    Lack of Transportation (Non-Medical): No  Physical Activity: Inactive (09/03/2023)   Exercise Vital Sign    Days of Exercise per Week: 0 days    Minutes of Exercise per Session: 40 min  Stress: No Stress Concern Present (09/03/2023)   [employer] of Occupational Health - Occupational Stress Questionnaire    Feeling of Stress : Not at all  Social Connections: Socially Integrated (09/03/2023)   Social Connection and Isolation Panel [NHANES]    Frequency of Communication with Friends and Family:  Twice a week    Frequency of Social Gatherings with Friends and Family: Three times a week    Attends Religious Services: More than 4 times per year    Active Member of Clubs or Organizations: Yes    Attends Engineer, structural: More than 4 times per year    Marital Status: Married    Tobacco Counseling Counseling given: Not Answered    Clinical Intake:  Pre-visit preparation completed: Yes  Pain : 0-10 Pain Score: 6  Pain Type: Chronic pain Pain Location: Back Pain Radiating Towards: Has surgery Monday/ 09/13/2023 Pain Descriptors / Indicators: Aching, Discomfort Pain Relieving Factors: Tylenol and C  Pain Relieving Factors: Tylenol and C  BMI - recorded: 36.33 Nutritional Status: BMI > 30  Obese Nutritional Risks: None  No results found for: "HGBA1C"   How often do you need to have someone help you when you read instructions, pamphlets, or other written materials from your doctor or pharmacy?: 1 - Never  Interpreter Needed?: No  Information entered by :: Robinson Brinkley, RMA   Activities of Daily Living     09/06/2023   11:35 AM 09/21/2022    3:21 PM  In your present state of health, do you have any difficulty performing the following activities:  Hearing? 1 0  Comment wears hearing aides   Vision? 0 0  Difficulty concentrating or making decisions? 0 0  Walking or climbing stairs? 0 0  Dressing or bathing? 0 0  Doing errands, shopping? 0 0  Preparing Food and eating ? N N  Using the Toilet? N N  In the past six months, have you accidently leaked urine? N Y  Do you have problems with loss of bowel control? N N  Managing your Medications? N N  Managing your Finances? N N  Housekeeping or managing your Housekeeping? N N    Patient Care Team: Plotnikov, Georgina Quint, MD as PCP - General Durene Romans, MD (Orthopedic Surgery) Tracey Harries, MD (Obstetrics and Gynecology) Sharrell Ku, MD (Gastroenterology) Dominica Severin, MD (Orthopedic  Surgery) Maris Berger, MD as Consulting Physician (Ophthalmology) Omar Person, MD as Consulting Physician (Pulmonary Disease) Omar Person, MD as  Consulting Physician (Pulmonary Disease)  Indicate any recent Medical Services you may have received from other than [facility] providers in the past year (date may be approximate).     Assessment:   This is a routine wellness examination for Farren.  Hearing/Vision screen Hearing Screening - Comments:: wears hearing aides Vision Screening - Comments:: Wears eyeglasses   Goals Addressed             This Visit's Progress    My goal is to get back to exercising 3 days a week.   Not on track      Depression Screen     09/06/2023   11:43 AM 03/08/2023   10:09 AM 09/21/2022    3:18 PM 06/24/2022    9:05 AM 10/06/2021    3:36 PM 06/23/2021    9:04 AM 09/26/2020   11:43 AM  PHQ 2/9 Scores  PHQ - 2 Score 0 1 0 0 0 0 0  PHQ- 9 Score 0 1 0   0     Fall Risk     09/06/2023   11:39 AM 03/08/2023   10:09 AM 09/21/2022    3:21 PM 09/20/2022    4:39 PM 06/24/2022    9:05 AM  Fall Risk   Falls in the past year? 0 0 0 0 0  Number falls in past yr: 0 0 0 0 0  Injury with Fall? 0 0 0 0 0  Risk for fall due to : No Fall Risks No Fall Risks No Fall Risks  No Fall Risks  Follow up Falls prevention discussed;Falls evaluation completed Falls evaluation completed Falls prevention discussed  Falls evaluation completed    MEDICARE RISK AT HOME:  Medicare Risk at Home Any stairs in or around the home?: Yes If so, are there any without handrails?: Yes Home free of loose throw rugs in walkways, pet beds, electrical cords, etc?: Yes Adequate lighting in your home to reduce risk of falls?: Yes Life alert?: No Use of a cane, walker or w/c?: No Grab bars in the bathroom?: Yes Shower chair or bench in shower?: No Elevated toilet seat or a handicapped toilet?: No  TIMED UP AND GO:  Was the test performed?  No  Cognitive Function: 6CIT  completed    03/04/2016   12:49 PM  MMSE - Mini Mental State Exam  Not completed: --        09/06/2023   11:40 AM 09/21/2022    3:24 PM 10/06/2021    3:44 PM  6CIT Screen  What Year? 0 points 0 points 0 points  What month? 0 points 0 points 0 points  What time? 0 points 0 points 0 points  Count back from 20 0 points 0 points 0 points  Months in reverse 0 points 0 points 0 points  Repeat phrase 0 points 0 points 0 points  Total Score 0 points 0 points 0 points    Immunizations Immunization History  Administered Date(s) Administered   Fluad Quad(high Dose 65+) 03/01/2020, 03/29/2021   H1N1 05/07/2008   Influenza Split 04/01/2012   Influenza Whole 03/04/2010   Influenza, High Dose Seasonal PF 03/04/2016, 03/10/2017, 02/22/2018, 01/27/2019   Influenza,inj,Quad PF,6+ Mos 02/20/2014   Influenza,inj,quad, With Preservative 03/01/2017   Influenza-Unspecified 03/09/2022, 03/03/2023   PFIZER Comirnaty(Gray Top)Covid-19 Tri-Sucrose Vaccine 09/18/2020   PFIZER(Purple Top)SARS-COV-2 Vaccination 06/26/2019, 07/17/2019, 02/10/2020   Pfizer Covid-19 Vaccine Bivalent Booster 72yrs & up 03/11/2021   Pfizer(Comirnaty)Fall Seasonal Vaccine 12 years and older 03/27/2022   Pneumococcal Conjugate-13  12/18/2013   Pneumococcal Polysaccharide-23 03/11/2006, 04/03/2019   Respiratory Syncytial Virus Vaccine,Recomb Aduvanted(Arexvy) 03/09/2022   Td 02/11/2006   Tdap 03/04/2016   Zoster Recombinant(Shingrix) 02/22/2018, 04/25/2018   Zoster, Live 11/07/2008    Screening Tests Health Maintenance  Topic Date Due   COVID-19 Vaccine (7 - 2024-25 season) 01/31/2023   INFLUENZA VACCINE  12/31/2023   Medicare Annual Wellness (AWV)  09/05/2024   DTaP/Tdap/Td (3 - Td or Tdap) 03/04/2026   Pneumonia Vaccine 37+ Years old  Completed   DEXA SCAN  Completed   Zoster Vaccines- Shingrix  Completed   HPV VACCINES  Aged Out    Health Maintenance  Health Maintenance Due  Topic Date Due   COVID-19 Vaccine (7  - 2024-25 season) 01/31/2023   Health Maintenance Items Addressed: Cologuard Ordered, See Nurse Notes  Additional Screening:  Vision Screening: Recommended annual ophthalmology exams for early detection of glaucoma and other disorders of the eye.  Dental Screening: Recommended annual dental exams for proper oral hygiene  Community Resource Referral / Chronic Care Management: CRR required this visit?  No   CCM required this visit?  No     Plan:     I have personally reviewed and noted the following in the patient's chart:   Medical and social history Use of alcohol, tobacco or illicit drugs  Current medications and supplements including opioid prescriptions. Patient is currently taking opioid prescriptions. Information provided to patient regarding non-opioid alternatives. Patient advised to discuss non-opioid treatment plan with their provider. Functional ability and status Nutritional status Physical activity Advanced directives List of other physicians Hospitalizations, surgeries, and ER visits in previous 12 months Vitals Screenings to include cognitive, depression, and falls Referrals and appointments  In addition, I have reviewed and discussed with patient certain preventive protocols, quality metrics, and best practice recommendations. A written personalized care plan for preventive services as well as general preventive health recommendations were provided to patient.     Theona Muhs L Sawyer Mentzer, CMA   09/06/2023   After Visit Summary: (MyChart) Due to this being a telephonic visit, the after visit summary with patients personalized plan was offered to patient via MyChart   Notes: Please refer to Routing Comments.  Medical screening examination/treatment/procedure(s) were performed by non-physician practitioner and as supervising physician I was immediately available for consultation/collaboration.  I agree with above. Jacinta Shoe, MD

## 2023-09-08 DIAGNOSIS — E785 Hyperlipidemia, unspecified: Secondary | ICD-10-CM | POA: Diagnosis not present

## 2023-09-08 DIAGNOSIS — Z87891 Personal history of nicotine dependence: Secondary | ICD-10-CM | POA: Diagnosis not present

## 2023-09-08 DIAGNOSIS — E669 Obesity, unspecified: Secondary | ICD-10-CM | POA: Diagnosis not present

## 2023-09-08 DIAGNOSIS — E039 Hypothyroidism, unspecified: Secondary | ICD-10-CM | POA: Diagnosis not present

## 2023-09-08 DIAGNOSIS — Z6837 Body mass index (BMI) 37.0-37.9, adult: Secondary | ICD-10-CM | POA: Diagnosis not present

## 2023-09-08 DIAGNOSIS — I10 Essential (primary) hypertension: Secondary | ICD-10-CM | POA: Diagnosis not present

## 2023-09-08 DIAGNOSIS — K219 Gastro-esophageal reflux disease without esophagitis: Secondary | ICD-10-CM | POA: Diagnosis not present

## 2023-09-10 ENCOUNTER — Other Ambulatory Visit: Payer: Self-pay | Admitting: Internal Medicine

## 2023-09-10 DIAGNOSIS — R053 Chronic cough: Secondary | ICD-10-CM

## 2023-09-10 NOTE — Telephone Encounter (Signed)
 Pt called in and I clarified that when a recall happens, the pharmacy has to pull the affected batches out of their stock, contact people who were distributed the bad batch so she should've been made aware. Pt voiced gratitude and understanding of all discussed

## 2023-09-13 ENCOUNTER — Telehealth: Payer: Self-pay | Admitting: Internal Medicine

## 2023-09-13 NOTE — Telephone Encounter (Signed)
 Copied from CRM [id number]. Topic: Referral - Status >> Sep 13, 2023  1:34 PM Juleen Oakland F wrote: Reason for CRM: Patient called says current referral to pulmonolgy called her but their booking into June - she would like new referral put in for [facility] faxed to [phone] ATTN: [facility].

## 2023-09-14 ENCOUNTER — Ambulatory Visit: Admitting: Internal Medicine

## 2023-09-14 ENCOUNTER — Encounter: Payer: Self-pay | Admitting: Internal Medicine

## 2023-09-14 VITALS — BP 120/84 | HR 86 | Ht 66.0 in | Wt 188.8 lb

## 2023-09-14 DIAGNOSIS — R053 Chronic cough: Secondary | ICD-10-CM | POA: Diagnosis not present

## 2023-09-14 DIAGNOSIS — R0982 Postnasal drip: Secondary | ICD-10-CM | POA: Diagnosis not present

## 2023-09-14 DIAGNOSIS — K219 Gastro-esophageal reflux disease without esophagitis: Secondary | ICD-10-CM

## 2023-09-14 LAB — POCT EXHALED NITRIC OXIDE: FeNO level (ppb): 25

## 2023-09-14 MED ORDER — BENZONATATE 200 MG PO CAPS
200.0000 mg | ORAL_CAPSULE | Freq: Three times a day (TID) | ORAL | 1 refills | Status: AC | PRN
Start: 2023-09-14 — End: ?

## 2023-09-14 MED ORDER — IPRATROPIUM BROMIDE 0.03 % NA SOLN
1.0000 | Freq: Four times a day (QID) | NASAL | 11 refills | Status: AC
Start: 2023-09-14 — End: ?

## 2023-09-14 NOTE — Patient Instructions (Addendum)
 It was a pleasure to see you today!  Please schedule follow up with myself in 6 weeks.  If my schedule is not open yet, we will contact you with a reminder closer to that time. Please call [phone] if you haven't heard from us  a month before, and always call us  sooner if issues or concerns arise. You can also send us  a message through MyChart, but but aware that this is not to be used for urgent issues and it may take up to 5-7 days to receive a reply. Please be aware that you will likely be able to view your results before I have a chance to respond to them. Please give us  5 business days to respond to any non-urgent results.    -CHRONIC COUGH: Your chronic cough is likely due to a combination of postnasal drip and silent reflux. We will perform a nitric oxide test to check for allergic inflammation. If the test is low, we will prescribe an ipratropium nasal spray. Continue taking pantoprazole for reflux and I will prescribe benzonatate lozenges to help suppress the cough.   I do not think the cough is related to infection or pneumonia.  Continue the anti-histamines as you are doing.   Ipratropium - 1 spray on each side of your nose twice a day for first week, then 1 spray on each side.   Instructions for use: If you also use a saline nasal spray or rinse, use that first. Position the head with the chin slightly tucked. Use the right hand to spray into the left nostril and the right hand to spray into the left nostril.   Point the bottle away from the septum of your nose (cartilage that divides the two sides of your nose).  Hold the nostril closed on the opposite side from where you will spray Spray once and gently sniff to pull the medicine into the higher parts of your nose.  Don't sniff too hard as the medicine will drain down the back of your throat instead. Repeat with a second spray on the same side if prescribed. Repeat on the other side of your nose.

## 2023-09-14 NOTE — Progress Notes (Signed)
 Mariah Farrell    [id number]    1940/12/01  Primary Care Physician:Plotnikov, Georgina Quint, MD  Referring Physician: Tresa Garter, MD [address] [place],  [place] [address] Reason for Consultation: coughing Date of Consultation: 09/14/2023  Chief complaint:   Chief Complaint  Patient presents with   Consult    Establish care. Coughing for a month, can't seem to get over it.     HPI: Discussed the use of AI scribe software for clinical note transcription with the patient, who gave verbal consent to proceed.  History of Present Illness Mariah Farrell is an 83 year old female who presents with a persistent cough for one month. She was referred by [facility] for evaluation of her persistent cough.  She has experienced a persistent and severe cough for one month, described as 'horrendous' and 'brutal'. The cough is worse in the morning and late afternoon, and it has recently started producing mucus, initially dark and then yellow. No fever, sore throat, or shortness of breath. The cough disrupts her sleep, waking her at night.  Her back surgery at [facility] was postponed due to the cough. She was treated with antibiotics in early April without symptom relief. She has not used steroids recently, although she has used them intermittently for back issues.  She uses a narcotic cough syrup, which may help slightly at night, but she continues to have coughing fits. She takes pantoprazole for reflux, which was increased a week ago, and antihistamines including Benadryl at night and Zyrtec during the day, providing some relief during the day.  She has a history of long COVID, which she believes may have contributed to a prolonged cough in the past. No recent upper respiratory infections or exposure to sick contacts. Her mother experienced bronchitis with colds.  The cough began after returning from a cruise, coinciding with exposure to pollen. She has not tried  nasal sprays but uses Sudafed without the 'D' due to heart concerns.   Social History   Occupational History   Occupation: RETIRED  Tobacco Use   Smoking status: Former    Current packs/day: 0.00    Average packs/day: 0.3 packs/day for 5.0 years (1.3 ttl pk-yrs)    Types: Cigarettes    Start date: 06/02/1959    Quit date: 06/01/1964    Years since quitting: 59.3   Smokeless tobacco: Never  Vaping Use   Vaping status: Never Used  Substance and Sexual Activity   Alcohol use: Yes    Comment: 1-2 times weekly   Drug use: No   Sexual activity: Not on file    Relevant family history:  Family History  Problem Relation Age of Onset   Cancer Brother 14       pancreatic   Brain cancer Father     Past Medical History:  Diagnosis Date   Arthritis    Cancer (HCC) 13 years ago   bladder   GERD (gastroesophageal reflux disease)    Goiter    synthroid used to shrink   H/O hiatal hernia    Headache(784.0)    migraines in morning   PONV (postoperative nausea and vomiting)    Seasonal allergies     Past Surgical History:  Procedure Laterality Date   ARTHRODESIS FOOT WITH WEIL OSTEOTOMY Right 07/26/2014   Procedure: RIGHT HALLUX METATARSOPHALANGEAL JOINT ARTHRODESIS; RIGHT SECOND METATARSAL WEIL OSTEOTOMY; DORSAL CAPSULOTOMY EXTENSOR TENDON LENGTHENING;  Surgeon: Toni Arthurs, MD;  Location: [facility];  Service: Orthopedics;  Laterality: Right;   BLADDER SURGERY  13 years ago   BUNIONECTOMY Bilateral    COLONOSCOPY     LUMBAR FUSION  May 2014   L4-L5   ROTATOR CUFF REPAIR Bilateral ~2010, 2012   SPINAL FUSION     L1, L2   TONSILLECTOMY  as child   TOTAL KNEE ARTHROPLASTY Right 07/19/2012   Procedure: TOTAL KNEE ARTHROPLASTY;  Surgeon: Bevin Bucks, MD;  Location: [facility];  Service: Orthopedics;  Laterality: Right;   TOTAL KNEE ARTHROPLASTY Left 08/01/2013   Procedure: LEFT TOTAL KNEE ARTHROPLASTY;  Surgeon: Bevin Bucks, MD;  Location: [facility];  Service:  Orthopedics;  Laterality: Left;     Physical Exam: Blood pressure 120/84, pulse 86, height 5\' 6"  (1.676 m), weight 188 lb 12.8 oz (85.6 kg), SpO2 93%. Gen:      No acute distress, frequent throat clearing, harsh cough ENT:  +cobblestoning, no nasal polyps, mucus membranes moist Lungs:    No increased respiratory effort, symmetric chest wall excursion, clear to auscultation bilaterally, no wheezes or crackles CV:         Regular rate and rhythm; no murmurs, rubs, or gallops.  No pedal edema Abd:      + bowel sounds; soft, non-tender; no distension MSK: no acute synovitis of DIP or PIP joints, no mechanics hands.  Skin:      Warm and dry; no rashes Neuro: normal speech, no focal facial asymmetry Psych: alert and oriented x3, normal mood and affect   Data Reviewed/Medical Decision Making:  Independent interpretation of tests: Imaging:  Review of patient's chest xray images march 2025 revealed no acute cardiopulmonary process. The patient's images have been independently reviewed by me.    PFTs:  Labs:  Lab Results  Component Value Date   NA 139 03/08/2023   K 4.2 03/08/2023   CO2 28 03/08/2023   GLUCOSE 91 03/08/2023   BUN 22 03/08/2023   CREATININE 1.17 03/08/2023   CALCIUM 9.3 03/08/2023   GFR 42.93 (L) 03/08/2023   GFRNONAA 51 (L) 09/10/2015   Lab Results  Component Value Date   WBC 4.3 03/08/2023   HGB 12.3 03/08/2023   HCT 37.3 03/08/2023   MCV 98.3 03/08/2023   PLT 272.0 03/08/2023    Immunization status:  Immunization History  Administered Date(s) Administered   Fluad Quad(high Dose 65+) 03/01/2020, 03/29/2021   H1N1 05/07/2008   Influenza Split 04/01/2012   Influenza Whole 03/04/2010   Influenza, High Dose Seasonal PF 03/04/2016, 03/10/2017, 02/22/2018, 01/27/2019   Influenza,inj,Quad PF,6+ Mos 02/20/2014   Influenza,inj,quad, With Preservative 03/01/2017   Influenza-Unspecified 03/09/2022, 03/03/2023   PFIZER Comirnaty(Gray Top)Covid-19 Tri-Sucrose  Vaccine 09/18/2020   PFIZER(Purple Top)SARS-COV-2 Vaccination 06/26/2019, 07/17/2019, 02/10/2020   Pfizer Covid-19 Vaccine Bivalent Booster 63yrs & up 03/11/2021   Pfizer(Comirnaty)Fall Seasonal Vaccine 12 years and older 03/27/2022   Pneumococcal Conjugate-13 12/18/2013   Pneumococcal Polysaccharide-23 03/11/2006, 04/03/2019   Respiratory Syncytial Virus Vaccine,Recomb Aduvanted(Arexvy) 03/09/2022   Td 02/11/2006   Tdap 03/04/2016   Unspecified SARS-COV-2 Vaccination 03/02/2023   Zoster Recombinant(Shingrix) 02/22/2018, 04/25/2018   Zoster, Live 11/07/2008     I reviewed prior external note(s) from primary care  I reviewed the result(s) of the labs and imaging as noted above.   I have ordered feno   Assessment and Plan Assessment & Plan Chronic Cough Chronic cough likely due to upper airway cough syndrome with postnasal drip and possible silent reflux. Differential includes postnasal drip, reflux, and allergic rhinitis.  Clear chest x-ray rules out pneumonia. -Feno obtained today 26 ppb, equivocal for ongoing allergic inflammation - Prescribe ipratropium nasal spray up to 4 times a day. - Continue pantoprazole for reflux management. - Prescribe benzonatate lozenges for cough suppression. - Follow up in six weeks to reassess symptoms and treatment efficacy. I do not think the cough is related to infection or pneumonia. Continue the anti-histamines  Reflux Disease Long-standing reflux disease with potential silent reflux contributing to cough. Increased pantoprazole dosage to manage symptoms. - Continue increased dose of pantoprazole.    Return to Care: Return in about 6 weeks (around 10/26/2023).  Louie Rover, MD Pulmonary and Critical Care Medicine [facility] Office:[phone]  CC: Plotnikov, Oakley Bellman, MD

## 2023-09-15 ENCOUNTER — Ambulatory Visit: Payer: Medicare HMO | Admitting: Neurology

## 2023-09-17 ENCOUNTER — Other Ambulatory Visit: Payer: Self-pay

## 2023-09-17 NOTE — Progress Notes (Signed)
 Specialty Pharmacy Refill Coordination Note  Mariah Farrell is a 83 y.o. female contacted today regarding refills of specialty medication(s) IncobotulinumtoxinA  (XEOMIN )   Patient requested Courier to Provider Office   Delivery date: 09/20/23   Verified address: [facility]  [address], [place] [address]   Medication will be filled on 04.18.25.   Reaching out to [facility] regarding copay. Medication is $703 on patient insurance.

## 2023-09-20 ENCOUNTER — Other Ambulatory Visit (HOSPITAL_COMMUNITY): Payer: Self-pay

## 2023-09-20 NOTE — Telephone Encounter (Signed)
 Patient has since seen [facility] (09/16/23)

## 2023-09-21 ENCOUNTER — Other Ambulatory Visit: Payer: Self-pay

## 2023-09-21 NOTE — Progress Notes (Signed)
 Patient has 200 units of xeomin  left at [facility] that will be used for next 2 appointments. Retiming for 24 weeks, may need to check with spaa/clinic at time of next fill.

## 2023-09-22 ENCOUNTER — Encounter: Payer: Self-pay | Admitting: Neurology

## 2023-09-22 ENCOUNTER — Ambulatory Visit: Admitting: Neurology

## 2023-09-22 VITALS — BP 135/86 | HR 78

## 2023-09-22 DIAGNOSIS — G243 Spasmodic torticollis: Secondary | ICD-10-CM

## 2023-09-22 MED ORDER — INCOBOTULINUMTOXINA 100 UNITS IM SOLR: 100.0000 [IU] | Freq: Once | INTRAMUSCULAR | Status: AC

## 2023-09-22 NOTE — Progress Notes (Signed)
 Chief Complaint  Patient presents with   Injections    Pt in 14,  Pt is here for Xeomin  injections for cervical dystonia.       ASSESSMENT AND PLAN  Mariah Farrell is a 83 y.o. female   Right-sided neck pain, radiating pain to right occipital region Multilevel cervical degenerative changes, most noticeable on the right side, Cervical dystonia Abnormal neck posturing, mild retrocollis, right tilt, mild right shoulder elevation, significant tenderness muscle spasm and right cervical paraspinal muscles,  She had right C1, C2 nerve block by neurosurgical pain management on April 16, 2022, with limited improvement  Higher dose of Cymbalta  40 mg helped her symptoms better, no significant side effect, further increase to 60 mg daily,  EMG guided xeomin  injection, used 100 units  ( dissolved into 4 cc of NS)   Right levator scapular 25 units Right semispinalis 12.5 units Right splenius cervix 25 units. Right splenius capitis 12.5 units  Right upper trapezius 25 units    DIAGNOSTIC DATA (LABS, IMAGING, TESTING) - I reviewed patient records, labs, notes, testing and imaging myself where available.   MEDICAL HISTORY:  Mariah Farrell is a 83 year old female, seen in request by orthopedic surgeon Dr. Amalia Badder for evaluation of right neck pain, she is accompanied by her husband Dr. [place] Hammans, retired orthopedic surgeon at today's visit on April 08, 2022, PCP Plotnikov, Oakley Bellman, MD   I reviewed and summarized the referring note. PMHx. Chronic insomnia Hypothyrodism GERD Lumbar decompression surgery Bilateral Rotator cuff Bilateral Knee replacement.  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 Nonda Bays in August 2023, without significant improvement, pending nerve block by neurosurgical pain management of right C1, 2 on April 16, 2022,  She also tried warm compression, NSAIDs as needed, gabapentin  with limited help, the constant moderate to severe right neck deep achy pain, with occasionally sharp radiating pain to right occipital parietal region has affected life quality, hope to receive some relief  Update May 11, 2022 She had right C1-C2 facet joint injection with only very temporary relief, constant right upper cervical region deep achy pain, difficulty sleeping, limited her daily activity  She is taking Celebrex  200 mg as needed along with Valium  2.5 mg as needed, gabapentin  300 mg 1 to 2 tablets at nighttime, frequent Aleve,   In addition she complains of intermittent headaches, often triggered by sharp radiating pain from neck,  UPDATE August 12 2022; She has mild improvement with last injection, has transient difficulty raising up her neck, denies significant neck muscle weakness, in the last 2 weeks, has previous injection wearing off, she noticed increased neck pain, radiating to right shoulder  She tolerates Cymbalta  20 mg, which seems to help her symptoms  Personally reviewed MRI of brain in Feb 2024, mild small vessel disease.  UPDATE July 8th 2024: She tolerated higher dose of Cymbalta  40 mg, does help her neck pain, will further increase to 60 mg daily, last injection using Xeomin  200 units has really helped her right-sided neck pain, headache improved as well,  UPDATE Mar 10 2023: She responded well to previous injection 200 units of xeomin , has helped her right-sided neck pain at least 75%, no significant side effect, at the end of the  injection cycle did notice recurrent neck pain,  UPDATE Jun 23 2023: She responded well to previous injection, there was no significant side effect noted.  UPDATE September 22 2023: Low-dose of xeomin  injection was helpful, she denies significant side effect  PHYSICAL EXAM:   Today's Vitals   09/22/23 1502  BP: 135/86  Pulse: 78   There is no height or weight on file to calculate BMI.  MOTOR: Abnormal neck posturing, mild right shoulder elevation, retrocollis, significant tenderness of cervical paraspinal muscles, at  bilateral levator scapula, especially at right upper cervical paraspinal muscles.    GAIT/STANCE: Need push-up to get up from seated position, cautious  REVIEW OF SYSTEMS:  Full 14 system review of systems performed and notable only for as above All other review of systems were negative.   ALLERGIES: Allergies  Allergen Reactions   Doxycycline Hives   Crab [Shellfish Allergy]    Penicillins    Prolia  [Denosumab ]     Jaw necrosis wound   Cephalexin  Rash    HOME MEDICATIONS: Current Outpatient Medications  Medication Sig Dispense Refill   benzonatate  (TESSALON ) 200 MG capsule Take 1 capsule (200 mg total) by mouth 3 (three) times daily as needed for cough. 90 capsule 1   Biotin  5000 MCG TABS Take 1 tablet by mouth daily.     calcitonin, salmon, (MIACALCIN/FORTICAL) 200 UNIT/ACT nasal spray USE ONE SPRAY IN ONE NOSTRIL DAILY. ALTERNATE NOSTRILS 11.1 mL 11   calcium-vitamin D  (OSCAL WITH D) 500-200 MG-UNIT per tablet Take 1 tablet by mouth daily.     cetirizine (ZYRTEC) 10 MG tablet Take 10 mg by mouth daily.     Cholecalciferol (VITAMIN D3) 2000 units capsule Take 1 capsule (2,000 Units total) by mouth daily. 100 capsule 3   diazepam  (VALIUM ) 5 MG tablet TAKE 1/2-1 TABLET BY MOUTH EVERY 12 HOURS AS NEEDED FOR ANXIETY, INSOMNIA 180 tablet 0   Diclofenac  Sodium (VOLTAREN EX) Apply topically as needed.     diphenhydrAMINE -PE-APAP 12.5-5-325 MG TABS Take 1 tablet by  mouth 4 (four) times daily as needed. 56 tablet 1   DULoxetine  (CYMBALTA ) 60 MG capsule Take 1 capsule (60 mg total) by mouth daily. 90 capsule 3   estradiol (ESTRACE) 0.1 MG/GM vaginal cream Place 1 Applicatorful vaginally 2 (two) times a week.     furosemide  (LASIX ) 20 MG tablet Take 1 tablet by mouth daily.     gabapentin  (NEURONTIN ) 100 MG capsule Take 1 capsule (100 mg total) by mouth 3 (three) times daily as needed. 90 capsule 3   gabapentin  (NEURONTIN ) 300 MG capsule Take 1 capsule (300 mg total) by mouth at bedtime. 90 capsule 3   glucosamine-chondroitin 500-400 MG tablet Take 1 tablet by mouth daily.     HYDROcodone  bit-homatropine (HYCODAN) 5-1.5 MG/5ML syrup Take 5 mLs by mouth every 8 (eight) hours as needed. 240 mL 0   incobotulinumtoxinA  (XEOMIN ) 100 units SOLR injection Inject 400 Units into the muscle every 3 (three) months. Inject 400 units into the muscle every 90 days. 4 each 0   ipratropium (ATROVENT ) 0.03 % nasal spray Place 1 spray into both nostrils 4 (four) times daily. 30 mL 11   latanoprost (XALATAN) 0.005 % ophthalmic solution SMARTSIG:In Eye(s)     levothyroxine  (SYNTHROID ) 25 MCG tablet TAKE 1 TABLET BY MOUTH DAILY BEFORE  BREAKFAST 90 tablet 3   methylPREDNISolone  (MEDROL  DOSEPAK) 4 MG TBPK tablet As directed 21 tablet 0   Multiple Vitamin (MULTIVITAMIN) tablet Take 1 tablet by mouth daily.     pantoprazole  (PROTONIX ) 40 MG tablet Take 1 tablet (40 mg total) by mouth 2 (two) times daily. 180 tablet 3   timolol (TIMOPTIC) 0.5 % ophthalmic solution Place 1 drop into both eyes daily.     traMADol  (ULTRAM ) 50 MG tablet Take 1 tablet (50 mg total) by mouth every 6 (six) hours as needed. 120 tablet 1   vitamin C (ASCORBIC ACID) 500 MG tablet Take 1,000 mg by mouth daily.      Current Facility-Administered Medications  Medication Dose Route Frequency Provider Last Rate Last Admin   incobotulinumtoxinA  (XEOMIN ) 100 units injection 100 Units  100 Units Intramuscular Q90 days  Phebe Brasil, MD   100 Units at 06/23/23 1340    PAST MEDICAL HISTORY: Past Medical History:  Diagnosis Date   Arthritis    Cancer (HCC) 13 years ago   bladder   GERD (gastroesophageal reflux disease)    Goiter    synthroid  used to shrink   H/O hiatal hernia    Headache(784.0)    migraines in morning   PONV (postoperative nausea and vomiting)    Seasonal allergies     PAST SURGICAL HISTORY: Past Surgical History:  Procedure Laterality Date   ARTHRODESIS FOOT WITH WEIL OSTEOTOMY Right 07/26/2014   Procedure: RIGHT HALLUX METATARSOPHALANGEAL JOINT ARTHRODESIS; RIGHT SECOND METATARSAL WEIL OSTEOTOMY; DORSAL CAPSULOTOMY EXTENSOR TENDON LENGTHENING;  Surgeon: Amada Backer, MD;  Location: [facility];  Service: Orthopedics;  Laterality: Right;   BLADDER SURGERY  13 years ago   BUNIONECTOMY Bilateral    COLONOSCOPY     LUMBAR FUSION  May 2014   L4-L5   ROTATOR CUFF REPAIR Bilateral ~2010, 2012   SPINAL FUSION     L1, L2   TONSILLECTOMY  as child   TOTAL KNEE ARTHROPLASTY Right 07/19/2012   Procedure: TOTAL KNEE ARTHROPLASTY;  Surgeon: Bevin Bucks, MD;  Location: [facility];  Service: Orthopedics;  Laterality: Right;   TOTAL KNEE ARTHROPLASTY Left 08/01/2013   Procedure: LEFT TOTAL KNEE ARTHROPLASTY;  Surgeon: Bevin Bucks, MD;  Location: [facility];  Service: Orthopedics;  Laterality: Left;    FAMILY HISTORY: Family History  Problem Relation Age of Onset   Cancer Brother 4       pancreatic   Brain cancer Father     SOCIAL HISTORY: Social History   Socioeconomic History   Marital status: Married    Spouse name: Philp   Number of children: 4   Years of education: Not on file   Highest education level: Some college, no degree  Occupational History   Occupation: RETIRED  Tobacco Use   Smoking status: Former    Current packs/day: 0.00    Average packs/day: 0.3 packs/day for 5.0 years (1.3 ttl pk-yrs)    Types: Cigarettes    Start date: 06/02/1959    Quit date:  06/01/1964    Years since quitting: 59.3   Smokeless tobacco: Never  Vaping Use   Vaping status: Never Used  Substance and Sexual Activity   Alcohol use: Yes    Comment: 1-2 times weekly   Drug use: No   Sexual activity: Not on file  Other Topics Concern   Not on file  Social History Narrative   Right handed   Caffeine-1 cup daily      Husband is  a Dr.  She lives at home with husband and 1 daughter   Social Drivers of Corporate investment banker Strain: Low Risk  (09/03/2023)   Overall Financial Resource Strain (CARDIA)    Difficulty of Paying Living Expenses: Not hard at all  Food Insecurity: No Food Insecurity (09/03/2023)   Hunger Vital Sign    Worried About Running Out of Food in the Last Year: Never true    Ran Out of Food in the Last Year: Never true  Transportation Needs: No Transportation Needs (09/03/2023)   PRAPARE - Administrator, Civil Service (Medical): No    Lack of Transportation (Non-Medical): No  Physical Activity: Unknown (09/03/2023)   Exercise Vital Sign    Days of Exercise per Week: 0 days    Minutes of Exercise per Session: Not on file  Recent Concern: Physical Activity - Inactive (09/03/2023)   Exercise Vital Sign    Days of Exercise per Week: 0 days    Minutes of Exercise per Session: 40 min  Stress: No Stress Concern Present (09/03/2023)   [employer] of Occupational Health - Occupational Stress Questionnaire    Feeling of Stress : Not at all  Social Connections: Socially Integrated (09/03/2023)   Social Connection and Isolation Panel [NHANES]    Frequency of Communication with Friends and Family: Twice a week    Frequency of Social Gatherings with Friends and Family: Three times a week    Attends Religious Services: More than 4 times per year    Active Member of Clubs or Organizations: Yes    Attends Banker Meetings: More than 4 times per year    Marital Status: Married  Catering manager Violence: Not At Risk (09/06/2023)    Humiliation, Afraid, Rape, and Kick questionnaire    Fear of Current or Ex-Partner: No    Emotionally Abused: No    Physically Abused: No    Sexually Abused: No      Phebe Brasil, M.D. Ph.D.  [facility] [address], [address] [place], [place] [address] Ph: [phone] Fax: [phone]  CC:  Plotnikov, Oakley Bellman, MD [address] [place],  [place] [address]  Plotnikov, Oakley Bellman, MD

## 2023-09-22 NOTE — Progress Notes (Signed)
 xeomin  100units x 1 vial  [id number] [id number] Exp-2026/09 s/p Bacteriostatic 0.9% Sodium Chloride - 4mL  RKY:[id number] Expiration: 04/01/24 NDC: 6283151761 Dx: G24.3  WITNESSED [id number] cma

## 2023-09-24 ENCOUNTER — Encounter: Admitting: Internal Medicine

## 2023-09-24 DIAGNOSIS — Z1211 Encounter for screening for malignant neoplasm of colon: Secondary | ICD-10-CM | POA: Diagnosis not present

## 2023-09-24 DIAGNOSIS — Z1212 Encounter for screening for malignant neoplasm of rectum: Secondary | ICD-10-CM | POA: Diagnosis not present

## 2023-09-30 LAB — COLOGUARD: COLOGUARD: NEGATIVE

## 2023-10-01 ENCOUNTER — Encounter: Payer: Self-pay | Admitting: Internal Medicine

## 2023-10-01 MED ORDER — ALBUTEROL SULFATE HFA 108 (90 BASE) MCG/ACT IN AERS
2.0000 | INHALATION_SPRAY | RESPIRATORY_TRACT | 0 refills | Status: AC | PRN
Start: 2023-10-01 — End: ?

## 2023-10-01 MED ORDER — PROMETHAZINE-DM 6.25-15 MG/5ML PO SYRP: 5.0000 mL | ORAL_SOLUTION | Freq: Four times a day (QID) | ORAL | 0 refills | Status: AC | PRN

## 2023-10-01 MED ORDER — METHYLPREDNISOLONE 4 MG PO TBPK: ORAL_TABLET | ORAL | 0 refills | Status: DC

## 2023-10-01 MED ORDER — HYDROCODONE BIT-HOMATROP MBR 5-1.5 MG/5ML PO SOLN: 5.0000 mL | Freq: Four times a day (QID) | ORAL | 0 refills | Status: DC | PRN

## 2023-10-28 DIAGNOSIS — M47816 Spondylosis without myelopathy or radiculopathy, lumbar region: Secondary | ICD-10-CM | POA: Diagnosis not present

## 2023-11-01 NOTE — Progress Notes (Unsigned)
 @Patient  ID: Mariah Farrell, female    DOB: 10-26-1940, 83 y.o.   MRN: [id number]  No chief complaint on file.   Referring provider: Plotnikov, Oakley Bellman, MD  HPI: 83 year old female, former remote smoker followed for chronic cough. She is a patient of Dr. Rayne Callas and last seen in office 09/14/2023. Past medical history significant for HTN, GERD, hypothyroid, OA, HLD, severe protein calorie malnutrition.   TEST/EVENTS:  08/16/2023 CXR: Lungs are clear  09/14/2023: OV with Dr. Dione Franks.  Referred for evaluation of persistent cough.  Difficulties over the last month.  Describes cough as parentis and brittle.  Worse in the morning and late afternoon.  Recently started producing mucus, initially dark yellow.  No fevers.  Back surgery at [facility] has been postponed due to cough.  Was treated with antibiotics early April without symptom relief.  Has not used any steroids.  Using a narcotic cough syrup, which may be helping slightly taking pantoprazole  for reflux which was increased a few weeks ago.  Using antihistamines including Benadryl  at night and Zyrtec during the day, providing some relief.  Likely due to upper airway irritation with postnasal drip and possible silent reflux.  Clear chest x-ray rules out pneumonia. FeNO 26 ppb, equivocal for ongoing allergic inflammation.  Prescribed ipratropium nasal spray and benzonatate  for cough suppression.  Continue antihistamines.  Continued increased dose of pantoprazole .  Follow-up 6 weeks  Allergies  Allergen Reactions   Doxycycline Hives   Crab [Shellfish Allergy]    Penicillins    Prolia  [Denosumab ]     Jaw necrosis wound   Cephalexin  Rash    Immunization History  Administered Date(s) Administered   Fluad Quad(high Dose 65+) 03/01/2020, 03/29/2021   H1N1 05/07/2008   Influenza Split 04/01/2012   Influenza Whole 03/04/2010   Influenza, High Dose Seasonal PF 03/04/2016, 03/10/2017, 02/22/2018, 01/27/2019   Influenza,inj,Quad PF,6+ Mos  02/20/2014   Influenza,inj,quad, With Preservative 03/01/2017   Influenza-Unspecified 03/09/2022, 03/03/2023   PFIZER Comirnaty (Gray Top)Covid-19 Tri-Sucrose Vaccine 09/18/2020   PFIZER(Purple Top)SARS-COV-2 Vaccination 06/26/2019, 07/17/2019, 02/10/2020   Pfizer Covid-19 Vaccine Bivalent Booster 11yrs & up 03/11/2021   Pfizer(Comirnaty )Fall Seasonal Vaccine 12 years and older 03/27/2022   Pneumococcal Conjugate-13 12/18/2013   Pneumococcal Polysaccharide-23 03/11/2006, 04/03/2019   Respiratory Syncytial Virus Vaccine,Recomb Aduvanted(Arexvy) 03/09/2022   Td 02/11/2006   Tdap 03/04/2016   Unspecified SARS-COV-2 Vaccination 03/02/2023   Zoster Recombinant(Shingrix) 02/22/2018, 04/25/2018   Zoster, Live 11/07/2008    Past Medical History:  Diagnosis Date   Arthritis    Cancer (HCC) 13 years ago   bladder   GERD (gastroesophageal reflux disease)    Goiter    synthroid  used to shrink   H/O hiatal hernia    Headache(784.0)    migraines in morning   PONV (postoperative nausea and vomiting)    Seasonal allergies     Tobacco History: Social History   Tobacco Use  Smoking Status Former   Current packs/day: 0.00   Average packs/day: 0.3 packs/day for 5.0 years (1.3 ttl pk-yrs)   Types: Cigarettes   Start date: 06/02/1959   Quit date: 06/01/1964   Years since quitting: 59.4  Smokeless Tobacco Never   Counseling given: Not Answered   Outpatient Medications Prior to Visit  Medication Sig Dispense Refill   albuterol  (VENTOLIN  HFA) 108 (90 Base) MCG/ACT inhaler Inhale 2 puffs into the lungs every 4 (four) hours as needed for wheezing or shortness of breath. 8 g 0   benzonatate  (TESSALON ) 200 MG capsule Take 1  capsule (200 mg total) by mouth 3 (three) times daily as needed for cough. 90 capsule 1   Biotin  5000 MCG TABS Take 1 tablet by mouth daily.     calcitonin, salmon, (MIACALCIN/FORTICAL) 200 UNIT/ACT nasal spray USE ONE SPRAY IN ONE NOSTRIL DAILY. ALTERNATE NOSTRILS 11.1 mL 11    calcium-vitamin D  (OSCAL WITH D) 500-200 MG-UNIT per tablet Take 1 tablet by mouth daily.     cetirizine (ZYRTEC) 10 MG tablet Take 10 mg by mouth daily.     Cholecalciferol (VITAMIN D3) 2000 units capsule Take 1 capsule (2,000 Units total) by mouth daily. 100 capsule 3   diazepam  (VALIUM ) 5 MG tablet TAKE 1/2-1 TABLET BY MOUTH EVERY 12 HOURS AS NEEDED FOR ANXIETY, INSOMNIA 180 tablet 0   Diclofenac  Sodium (VOLTAREN EX) Apply topically as needed.     diphenhydrAMINE -PE-APAP 12.5-5-325 MG TABS Take 1 tablet by mouth 4 (four) times daily as needed. 56 tablet 1   DULoxetine  (CYMBALTA ) 60 MG capsule Take 1 capsule (60 mg total) by mouth daily. 90 capsule 3   estradiol (ESTRACE) 0.1 MG/GM vaginal cream Place 1 Applicatorful vaginally 2 (two) times a week.     furosemide  (LASIX ) 20 MG tablet Take 1 tablet by mouth daily.     gabapentin  (NEURONTIN ) 100 MG capsule Take 1 capsule (100 mg total) by mouth 3 (three) times daily as needed. 90 capsule 3   gabapentin  (NEURONTIN ) 300 MG capsule Take 1 capsule (300 mg total) by mouth at bedtime. 90 capsule 3   glucosamine-chondroitin 500-400 MG tablet Take 1 tablet by mouth daily.     HYDROcodone  bit-homatropine (HYCODAN) 5-1.5 MG/5ML syrup Take 5 mLs by mouth 4 (four) times daily as needed for cough (Alternate every 4-6 hours with promethazine  cough syrup). 240 mL 0   incobotulinumtoxinA  (XEOMIN ) 100 units SOLR injection Inject 400 Units into the muscle every 3 (three) months. Inject 400 units into the muscle every 90 days. 4 each 0   ipratropium (ATROVENT ) 0.03 % nasal spray Place 1 spray into both nostrils 4 (four) times daily. 30 mL 11   latanoprost (XALATAN) 0.005 % ophthalmic solution SMARTSIG:In Eye(s)     levothyroxine  (SYNTHROID ) 25 MCG tablet TAKE 1 TABLET BY MOUTH DAILY BEFORE BREAKFAST 90 tablet 3   methylPREDNISolone  (MEDROL  DOSEPAK) 4 MG TBPK tablet As directed 21 tablet 0   Multiple Vitamin (MULTIVITAMIN) tablet Take 1 tablet by mouth daily.      pantoprazole  (PROTONIX ) 40 MG tablet Take 1 tablet (40 mg total) by mouth 2 (two) times daily. 180 tablet 3   promethazine -dextromethorphan (PROMETHAZINE -DM) 6.25-15 MG/5ML syrup Take 5 mLs by mouth 4 (four) times daily as needed for cough (Alternate every 4-6 hours with hydrocodone  cough syrup). 240 mL 0   timolol (TIMOPTIC) 0.5 % ophthalmic solution Place 1 drop into both eyes daily.     traMADol  (ULTRAM ) 50 MG tablet Take 1 tablet (50 mg total) by mouth every 6 (six) hours as needed. 120 tablet 1   vitamin C (ASCORBIC ACID) 500 MG tablet Take 1,000 mg by mouth daily.      Facility-Administered Medications Prior to Visit  Medication Dose Route Frequency Provider Last Rate Last Admin   incobotulinumtoxinA  (XEOMIN ) 100 units injection 100 Units  100 Units Intramuscular Q90 days Phebe Brasil, MD   100 Units at 06/23/23 1340   incobotulinumtoxinA  (XEOMIN ) 100 units injection 100 Units  100 Units Intramuscular Once Phebe Brasil, MD         Review of Systems:   Constitutional:  No weight loss or gain, night sweats, fevers, chills, fatigue, or lassitude. HEENT: No headaches, difficulty swallowing, tooth/dental problems, or sore throat. No sneezing, itching, ear ache, nasal congestion, or post nasal drip CV:  No chest pain, orthopnea, PND, swelling in lower extremities, anasarca, dizziness, palpitations, syncope Resp: No shortness of breath with exertion or at rest. No excess mucus or change in color of mucus. No productive or non-productive. No hemoptysis. No wheezing.  No chest wall deformity GI:  No heartburn, indigestion, abdominal pain, nausea, vomiting, diarrhea, change in bowel habits, loss of appetite, bloody stools.  GU: No dysuria, change in color of urine, urgency or frequency.  No flank pain, no hematuria  Skin: No rash, lesions, ulcerations MSK:  No joint pain or swelling.  No decreased range of motion.  No back pain. Neuro: No dizziness or lightheadedness.  Psych: No depression or anxiety.  Mood stable.     Physical Exam:  There were no vitals taken for this visit.  GEN: Pleasant, interactive, well-nourished/chronically-ill appearing/acutely-ill appearing/poorly-nourished/morbidly obese; in no acute distress.****** HEENT:  Normocephalic and atraumatic. EACs patent bilaterally. TM pearly gray with present light reflex bilaterally. PERRLA. Sclera white. Nasal turbinates pink, moist and patent bilaterally. No rhinorrhea present. Oropharynx pink and moist, without exudate or edema. No lesions, ulcerations, or postnasal drip.  NECK:  Supple w/ fair ROM. No JVD present. Normal carotid impulses w/o bruits. Thyroid  symmetrical with no goiter or nodules palpated. No lymphadenopathy.   CV: RRR, no m/r/g, no peripheral edema. Pulses intact, +2 bilaterally. No cyanosis, pallor or clubbing. PULMONARY:  Unlabored, regular breathing. Clear bilaterally A&P w/o wheezes/rales/rhonchi. No accessory muscle use.  GI: BS present and normoactive. Soft, non-tender to palpation. No organomegaly or masses detected. No CVA tenderness. MSK: No erythema, warmth or tenderness. Cap refil <2 sec all extrem. No deformities or joint swelling noted.  Neuro: A/Ox3. No focal deficits noted.   Skin: Warm, no lesions or rashe Psych: Normal affect and behavior. Judgement and thought content appropriate.     Lab Results:  CBC    Component Value Date/Time   WBC 4.3 03/08/2023 1058   RBC 3.79 (L) 03/08/2023 1058   HGB 12.3 03/08/2023 1058   HCT 37.3 03/08/2023 1058   PLT 272.0 03/08/2023 1058   MCV 98.3 03/08/2023 1058   MCH 31.5 09/10/2015 1333   MCHC 33.1 03/08/2023 1058   RDW 13.4 03/08/2023 1058   LYMPHSABS 0.9 03/08/2023 1058   MONOABS 0.5 03/08/2023 1058   EOSABS 0.2 03/08/2023 1058   BASOSABS 0.0 03/08/2023 1058    BMET    Component Value Date/Time   NA 139 03/08/2023 1058   K 4.2 03/08/2023 1058   CL 103 03/08/2023 1058   CO2 28 03/08/2023 1058   GLUCOSE 91 03/08/2023 1058   GLUCOSE 92  12/20/2014 0935   GLUCOSE 98 04/01/2006 1028   BUN 22 03/08/2023 1058   CREATININE 1.17 03/08/2023 1058   CALCIUM 9.3 03/08/2023 1058   GFRNONAA 51 (L) 09/10/2015 1333   GFRAA 60 (L) 09/10/2015 1333    BNP No results found for: "BNP"   Imaging:  No results found.  Administration History     None           No data to display          No results found for: "NITRICOXIDE"      Assessment & Plan:   No problem-specific Assessment & Plan notes found for this encounter.   Advised if symptoms do not improve  or worsen, to please contact office for sooner follow up or seek emergency care.   I spent *** minutes of dedicated to the care of this patient on the date of this encounter to include pre-visit review of records, face-to-face time with the patient discussing conditions above, post visit ordering of testing, clinical documentation with the electronic health record, making appropriate referrals as documented, and communicating necessary findings to members of the patients care team.  Roetta Clarke, NP 11/01/2023  Pt aware and understands NP's role.

## 2023-11-03 ENCOUNTER — Ambulatory Visit: Admitting: Nurse Practitioner

## 2023-11-03 ENCOUNTER — Encounter: Payer: Self-pay | Admitting: Nurse Practitioner

## 2023-11-03 VITALS — BP 130/84 | HR 85 | Ht 60.0 in | Wt 186.8 lb

## 2023-11-03 DIAGNOSIS — R053 Chronic cough: Secondary | ICD-10-CM | POA: Diagnosis not present

## 2023-11-03 DIAGNOSIS — Z01811 Encounter for preprocedural respiratory examination: Secondary | ICD-10-CM | POA: Diagnosis not present

## 2023-11-03 DIAGNOSIS — Z87891 Personal history of nicotine dependence: Secondary | ICD-10-CM | POA: Diagnosis not present

## 2023-11-03 NOTE — Assessment & Plan Note (Signed)
 Low risk. Factors that increase the risk for postoperative pulmonary complications are chronic cough, age, obesity.   Respiratory complications generally occur in 1% of ASA Class I patients, 5% of ASA Class II and 10% of ASA Class III-IV patients These complications rarely result in mortality and include postoperative pneumonia, atelectasis, pulmonary embolism, ARDS and increased time requiring postoperative mechanical ventilation.   Overall, I recommend proceeding with the surgery if the risk for respiratory complications are outweighed by the potential benefits. This will need to be discussed between the patient and surgeon.   To reduce risks of respiratory complications, I recommend: --Pre- and post-operative incentive spirometry performed frequently while awake --Short duration of surgery as much as possible and avoid paralytic if possible --OOB, encourage mobility post-op  Recent CXR clear.

## 2023-11-03 NOTE — Patient Instructions (Addendum)
-   Continue ipratropium nasal spray up to 4 times a day as needed for nasal congestion/drainage  - Continue pantoprazole  for reflux management. - Continue zyrtec 1 tab daily for allergies   Up and moving as soon as possible after surgery. Use incentive spirometer 10 times an hour while awake during the recovery period  Good luck with your surgery  Follow up in 1 year with Dr. Dione Franks. If symptoms worsen, please contact office for sooner follow up or seek emergency care.

## 2023-11-03 NOTE — Assessment & Plan Note (Signed)
 Improved chronic cough. Suspect upper airway in nature. Continue postnasal drainage and GERD management. Prior workup without significant pulmonary etiology.   Patient Instructions  - Continue ipratropium nasal spray up to 4 times a day as needed for nasal congestion/drainage  - Continue pantoprazole  for reflux management. - Continue zyrtec 1 tab daily for allergies   Up and moving as soon as possible after surgery. Use incentive spirometer 10 times an hour while awake during the recovery period  Good luck with your surgery  Follow up in 1 year with Dr. Dione Franks. If symptoms worsen, please contact office for sooner follow up or seek emergency care.

## 2023-11-09 ENCOUNTER — Telehealth: Payer: Self-pay | Admitting: *Deleted

## 2023-11-09 NOTE — Telephone Encounter (Signed)
 Copy of Katie's note from 11/03/23 faxed to the number provided

## 2023-11-09 NOTE — Telephone Encounter (Signed)
 Copied from CRM [id number]. Topic: General - Other >> Nov 03, 2023 11:27 AM Justina Oman C wrote: Reason for CRM: Patient [phone] states saw NP, Cobb today and needs medical clearance for spinal fusion surgery at [facility] Dr. Earlie Glen, Attn: Tona Francis nurse navigator [phone] fax# [phone]. Please advise and call back.

## 2023-11-11 ENCOUNTER — Ambulatory Visit: Admitting: Internal Medicine

## 2023-11-19 DIAGNOSIS — J309 Allergic rhinitis, unspecified: Secondary | ICD-10-CM | POA: Diagnosis not present

## 2023-11-19 DIAGNOSIS — K219 Gastro-esophageal reflux disease without esophagitis: Secondary | ICD-10-CM | POA: Diagnosis not present

## 2023-11-19 DIAGNOSIS — R042 Hemoptysis: Secondary | ICD-10-CM | POA: Diagnosis not present

## 2023-12-08 DIAGNOSIS — M961 Postlaminectomy syndrome, not elsewhere classified: Secondary | ICD-10-CM | POA: Diagnosis not present

## 2023-12-08 DIAGNOSIS — E039 Hypothyroidism, unspecified: Secondary | ICD-10-CM | POA: Diagnosis not present

## 2023-12-08 DIAGNOSIS — M5416 Radiculopathy, lumbar region: Secondary | ICD-10-CM | POA: Diagnosis not present

## 2023-12-08 DIAGNOSIS — Z981 Arthrodesis status: Secondary | ICD-10-CM | POA: Diagnosis not present

## 2023-12-08 DIAGNOSIS — Z6836 Body mass index (BMI) 36.0-36.9, adult: Secondary | ICD-10-CM | POA: Diagnosis not present

## 2023-12-08 DIAGNOSIS — K219 Gastro-esophageal reflux disease without esophagitis: Secondary | ICD-10-CM | POA: Diagnosis not present

## 2023-12-08 DIAGNOSIS — M47816 Spondylosis without myelopathy or radiculopathy, lumbar region: Secondary | ICD-10-CM | POA: Diagnosis not present

## 2023-12-08 DIAGNOSIS — I10 Essential (primary) hypertension: Secondary | ICD-10-CM | POA: Diagnosis not present

## 2023-12-08 DIAGNOSIS — E785 Hyperlipidemia, unspecified: Secondary | ICD-10-CM | POA: Diagnosis not present

## 2023-12-08 DIAGNOSIS — M51369 Other intervertebral disc degeneration, lumbar region without mention of lumbar back pain or lower extremity pain: Secondary | ICD-10-CM | POA: Diagnosis not present

## 2023-12-08 DIAGNOSIS — E669 Obesity, unspecified: Secondary | ICD-10-CM | POA: Diagnosis not present

## 2023-12-09 DIAGNOSIS — I10 Essential (primary) hypertension: Secondary | ICD-10-CM | POA: Diagnosis not present

## 2023-12-14 ENCOUNTER — Ambulatory Visit: Admitting: Internal Medicine

## 2023-12-14 VITALS — BP 110/70 | HR 74 | Temp 99.0°F | Ht 60.0 in | Wt 189.0 lb

## 2023-12-14 DIAGNOSIS — R071 Chest pain on breathing: Secondary | ICD-10-CM | POA: Diagnosis not present

## 2023-12-14 DIAGNOSIS — E049 Nontoxic goiter, unspecified: Secondary | ICD-10-CM

## 2023-12-14 DIAGNOSIS — R053 Chronic cough: Secondary | ICD-10-CM | POA: Diagnosis not present

## 2023-12-14 DIAGNOSIS — M545 Low back pain, unspecified: Secondary | ICD-10-CM | POA: Diagnosis not present

## 2023-12-14 DIAGNOSIS — R0789 Other chest pain: Secondary | ICD-10-CM | POA: Diagnosis not present

## 2023-12-14 DIAGNOSIS — R059 Cough, unspecified: Secondary | ICD-10-CM | POA: Insufficient documentation

## 2023-12-14 DIAGNOSIS — M546 Pain in thoracic spine: Secondary | ICD-10-CM

## 2023-12-14 MED ORDER — HYDROCODONE-ACETAMINOPHEN 5-325 MG PO TABS: 1.0000 | ORAL_TABLET | Freq: Four times a day (QID) | ORAL | 0 refills | Status: DC | PRN

## 2023-12-14 NOTE — Assessment & Plan Note (Addendum)
 Worse Norco Will get an MRI

## 2023-12-14 NOTE — Progress Notes (Signed)
 Subjective:  Patient ID: Mariah Farrell, female    DOB: 01/01/1941  Age: 83 y.o. MRN: [id number]  CC: Medical Management of Chronic Issues   HPI Rhiannon Sassaman presents for severe upper back pain irrad to chest - episodic - treated with steroids several times at [facility].  Bruna is here with her husband [name]: They would like to take a better look at her chest organs and at her thoracic spine. Bruna was taken to surgery yesterday, they could not intubate her because trachea was too narrow and the surgery was canceled- ENT appt is scheduled on Thursday this week.  Cough that she had for a long time has resolved  Outpatient Medications Prior to Visit  Medication Sig Dispense Refill   albuterol  (VENTOLIN  HFA) 108 (90 Base) MCG/ACT inhaler Inhale 2 puffs into the lungs every 4 (four) hours as needed for wheezing or shortness of breath. 8 g 0   aspirin -acetaminophen -caffeine (EXCEDRIN MIGRAINE) 250-250-65 MG tablet Take 1 tablet by mouth.     benzonatate  (TESSALON ) 200 MG capsule Take 1 capsule (200 mg total) by mouth 3 (three) times daily as needed for cough. 90 capsule 1   Biotin  5000 MCG TABS Take 1 tablet by mouth daily.     calcitonin, salmon, (MIACALCIN/FORTICAL) 200 UNIT/ACT nasal spray USE ONE SPRAY IN ONE NOSTRIL DAILY. ALTERNATE NOSTRILS 11.1 mL 11   Calcium Carb-Cholecalciferol (OYSTER SHELL CALCIUM W/D) 500-5 MG-MCG TABS Take 1 tablet by mouth daily.     calcium-vitamin D  (OSCAL WITH D) 500-200 MG-UNIT per tablet Take 1 tablet by mouth daily.     cetirizine (ZYRTEC) 10 MG tablet Take 10 mg by mouth daily.     Cholecalciferol (VITAMIN D3) 2000 units capsule Take 1 capsule (2,000 Units total) by mouth daily. 100 capsule 3   diazepam  (VALIUM ) 5 MG tablet TAKE 1/2-1 TABLET BY MOUTH EVERY 12 HOURS AS NEEDED FOR ANXIETY, INSOMNIA 180 tablet 0   Diclofenac  Sodium (VOLTAREN EX) Apply topically as needed.     diphenhydrAMINE -PE-APAP 12.5-5-325 MG TABS Take 1 tablet by mouth 4  (four) times daily as needed. 56 tablet 1   DULoxetine  (CYMBALTA ) 60 MG capsule Take 1 capsule (60 mg total) by mouth daily. 90 capsule 3   estradiol (ESTRACE) 0.1 MG/GM vaginal cream Place 1 Applicatorful vaginally 2 (two) times a week.     furosemide  (LASIX ) 20 MG tablet Take 1 tablet by mouth daily.     gabapentin  (NEURONTIN ) 100 MG capsule Take 1 capsule (100 mg total) by mouth 3 (three) times daily as needed. 90 capsule 3   gabapentin  (NEURONTIN ) 300 MG capsule Take 1 capsule (300 mg total) by mouth at bedtime. 90 capsule 3   glucosamine-chondroitin 500-400 MG tablet Take 1 tablet by mouth daily.     HYDROcodone  bit-homatropine (HYCODAN) 5-1.5 MG/5ML syrup Take 5 mLs by mouth 4 (four) times daily as needed for cough (Alternate every 4-6 hours with promethazine  cough syrup). 240 mL 0   incobotulinumtoxinA  (XEOMIN ) 100 units SOLR injection Inject 400 Units into the muscle every 3 (three) months. Inject 400 units into the muscle every 90 days. 4 each 0   ipratropium (ATROVENT ) 0.03 % nasal spray Place 1 spray into both nostrils 4 (four) times daily. 30 mL 11   latanoprost (XALATAN) 0.005 % ophthalmic solution SMARTSIG:In Eye(s)     levothyroxine  (SYNTHROID ) 25 MCG tablet TAKE 1 TABLET BY MOUTH DAILY BEFORE BREAKFAST 90 tablet 3   methylPREDNISolone  (MEDROL  DOSEPAK) 4 MG TBPK tablet As directed 21  tablet 0   Multiple Vitamin (MULTIVITAMIN) tablet Take 1 tablet by mouth daily.     pantoprazole  (PROTONIX ) 40 MG tablet Take 1 tablet (40 mg total) by mouth 2 (two) times daily. 180 tablet 3   promethazine -dextromethorphan (PROMETHAZINE -DM) 6.25-15 MG/5ML syrup Take 5 mLs by mouth 4 (four) times daily as needed for cough (Alternate every 4-6 hours with hydrocodone  cough syrup). 240 mL 0   timolol (TIMOPTIC) 0.5 % ophthalmic solution Place 1 drop into both eyes daily.     traMADol  (ULTRAM ) 50 MG tablet Take 1 tablet (50 mg total) by mouth every 6 (six) hours as needed. 120 tablet 1   vitamin C (ASCORBIC  ACID) 500 MG tablet Take 1,000 mg by mouth daily.      Facility-Administered Medications Prior to Visit  Medication Dose Route Frequency Provider Last Rate Last Admin   incobotulinumtoxinA  (XEOMIN ) 100 units injection 100 Units  100 Units Intramuscular Q90 days Onita Duos, MD   100 Units at 06/23/23 1340   incobotulinumtoxinA  (XEOMIN ) 100 units injection 100 Units  100 Units Intramuscular Once Onita Duos, MD        ROS: Review of Systems  Constitutional:  Positive for fatigue. Negative for activity change, appetite change, chills and unexpected weight change.  HENT:  Negative for congestion, mouth sores and sinus pressure.   Eyes:  Negative for visual disturbance.  Respiratory:  Negative for cough, chest tightness and wheezing.   Cardiovascular:  Positive for chest pain. Negative for leg swelling.  Gastrointestinal:  Negative for abdominal pain and nausea.  Genitourinary:  Negative for difficulty urinating, frequency and vaginal pain.  Musculoskeletal:  Positive for arthralgias, back pain, gait problem, neck pain and neck stiffness.  Skin:  Negative for pallor and rash.  Neurological:  Negative for dizziness, tremors, weakness, numbness and headaches.  Psychiatric/Behavioral:  Negative for confusion and sleep disturbance. The patient is not nervous/anxious.     Objective:  BP 110/70   Pulse 74   Temp 99 F (37.2 C) (Oral)   Ht 5' (1.524 m)   Wt 189 lb (85.7 kg)   SpO2 96%   BMI 36.91 kg/m   BP Readings from Last 3 Encounters:  12/14/23 110/70  11/03/23 130/84  09/22/23 135/86    Wt Readings from Last 3 Encounters:  12/14/23 189 lb (85.7 kg)  11/03/23 186 lb 12.8 oz (84.7 kg)  09/14/23 188 lb 12.8 oz (85.6 kg)    Physical Exam Constitutional:      General: She is not in acute distress.    Appearance: She is well-developed. She is obese.  HENT:     Head: Normocephalic.     Right Ear: External ear normal.     Left Ear: External ear normal.     Nose: Nose normal.   Eyes:     General:        Right eye: No discharge.        Left eye: No discharge.     Conjunctiva/sclera: Conjunctivae normal.     Pupils: Pupils are equal, round, and reactive to light.  Neck:     Thyroid : No thyromegaly.     Vascular: No JVD.     Trachea: No tracheal deviation.  Cardiovascular:     Rate and Rhythm: Normal rate and regular rhythm.     Heart sounds: Normal heart sounds.  Pulmonary:     Effort: No respiratory distress.     Breath sounds: No stridor. No wheezing.  Abdominal:     General:  Bowel sounds are normal. There is no distension.     Palpations: Abdomen is soft. There is no mass.     Tenderness: There is no abdominal tenderness. There is no guarding or rebound.  Musculoskeletal:        General: Tenderness present.     Cervical back: Normal range of motion and neck supple. No rigidity.     Right lower leg: No edema.     Left lower leg: No edema.  Lymphadenopathy:     Cervical: No cervical adenopathy.  Skin:    Findings: No erythema or rash.  Neurological:     Cranial Nerves: No cranial nerve deficit.     Motor: No abnormal muscle tone.     Coordination: Coordination normal.     Gait: Gait abnormal.     Deep Tendon Reflexes: Reflexes normal.  Psychiatric:        Behavior: Behavior normal.        Thought Content: Thought content normal.        Judgment: Judgment normal.   No neck masses palpable Submandibular salivary glands are slightly enlarged. Vaguely palpable thyroid  gland with flat nodules Cervical spine is stiff Thoracic spine is painful with range of motion Lumbar spine is painful with range of motion Antalgic gait   Lab Results  Component Value Date   WBC 4.3 03/08/2023   HGB 12.3 03/08/2023   HCT 37.3 03/08/2023   PLT 272.0 03/08/2023   GLUCOSE 91 03/08/2023   CHOL 199 06/24/2022   TRIG 144.0 06/24/2022   HDL 55.00 06/24/2022   LDLCALC 115 (H) 06/24/2022   ALT 16 03/08/2023   AST 23 03/08/2023   NA 139 03/08/2023   K 4.2  03/08/2023   CL 103 03/08/2023   CREATININE 1.17 03/08/2023   BUN 22 03/08/2023   CO2 28 03/08/2023   TSH 0.83 06/24/2022   INR 1.0 04/10/2021    US  Abdomen Complete Result Date: 03/19/2023 CLINICAL DATA:  Right abdomen pain and right flank pain. EXAM: ABDOMEN ULTRASOUND COMPLETE COMPARISON:  April 07, 2016 FINDINGS: Gallbladder: No gallstones or wall thickening visualized. No sonographic Murphy sign noted by sonographer. Common bile duct: Diameter: 4 mm Liver: Simple cysts identified within the liver, largest measures 2 x 1.8 x 1.6 cm. Within normal limits in parenchymal echogenicity. Portal vein is patent on color Doppler imaging with normal direction of blood flow towards the liver. IVC: No abnormality visualized. Pancreas: Visualized portion unremarkable. Spleen: Size and appearance within normal limits. Right Kidney: Length: 9.6 cm. Echogenicity within normal limits. No mass or hydronephrosis visualized. Left Kidney: Length: 10 cm. Echogenicity within normal limits. No mass or hydronephrosis visualized. Abdominal aorta: No aneurysm visualized. Other findings: None. IMPRESSION: No acute abnormality identified. Electronically Signed   By: Craig Farr M.D.   On: 03/19/2023 10:14    Assessment & Plan:   Problem List Items Addressed This Visit     Goiter   Bruna was taken to surgery yesterday, they could not intubate her because trachea was too narrow and the surgery was canceled - ENT appt is scheduled on Thursday this week. This could be related to goiter or something else      Lumbar back pain   Bruna was taken to surgery yesterday, they could not intubate her because trachea was too narrow and the surgery was canceled- ENT appt is scheduled on Thursday this week.      Relevant Medications   HYDROcodone -acetaminophen  (NORCO/VICODIN) 5-325 MG tablet   Chest pain, atypical  New severe upper back pain irrad to chest - episodic pain of unclear etiology- treated with steroids several  times at [facility].  Bruna is here with her husband [name]: They would like to take a better look at her chest organs and at her thoracic spine. Chest CT was ordered. Thoracic spine MRI was ordered. Norco as needed instead of tramadol   Potential benefits of a short/long term opioids use as well as potential risks (i.e. addiction risk, apnea etc) and complications (i.e. Somnolence, constipation and others) were explained to the patient and were aknowledged.        Relevant Orders   MR THORACIC SPINE W WO CONTRAST   CT Chest Wo Contrast   CBC with Differential/Platelet   Comprehensive metabolic panel with GFR   Thoracic spine pain - Primary   Worse Norco Will get an MRI      Relevant Medications   HYDROcodone -acetaminophen  (NORCO/VICODIN) 5-325 MG tablet   Other Relevant Orders   MR THORACIC SPINE W WO CONTRAST   CT Chest Wo Contrast   Cough   Chronic cough of several months duration (post URI) has resolved completely. Bruna was taken to surgery yesterday, they could not intubate her because trachea was too narrow and the surgery was canceled - ENT appt is scheduled on Thursday this week. This could be related/aggravated by  tracheal compression due to goiter or something else      Other Visit Diagnoses       Chest pain on breathing       Relevant Orders   CT Chest Wo Contrast         Meds ordered this encounter  Medications   HYDROcodone -acetaminophen  (NORCO/VICODIN) 5-325 MG tablet    Sig: Take 1 tablet by mouth every 6 (six) hours as needed for severe pain (pain score 7-10).    Dispense:  20 tablet    Refill:  0      Follow-up: Return in about 6 weeks (around 01/25/2024) for a follow-up visit.  Marolyn Noel, MD

## 2023-12-14 NOTE — Assessment & Plan Note (Signed)
 Mariah Farrell was taken to surgery yesterday, they could not intubate her because trachea was too narrow and the surgery was canceled- ENT appt is scheduled on Thursday this week.

## 2023-12-14 NOTE — Assessment & Plan Note (Signed)
 Mariah Farrell was taken to surgery yesterday, they could not intubate her because trachea was too narrow and the surgery was canceled - ENT appt is scheduled on Thursday this week. This could be related to goiter or something else

## 2023-12-14 NOTE — Assessment & Plan Note (Addendum)
 New severe upper back pain irrad to chest - episodic pain of unclear etiology- treated with steroids several times at [facility].  Bruna is here with her husband [name]: They would like to take a better look at her chest organs and at her thoracic spine. Chest CT was ordered. Thoracic spine MRI was ordered. Norco as needed instead of tramadol   Potential benefits of a short/long term opioids use as well as potential risks (i.e. addiction risk, apnea etc) and complications (i.e. Somnolence, constipation and others) were explained to the patient and were aknowledged.

## 2023-12-15 ENCOUNTER — Telehealth: Payer: Self-pay | Admitting: Internal Medicine

## 2023-12-15 DIAGNOSIS — R202 Paresthesia of skin: Secondary | ICD-10-CM

## 2023-12-15 DIAGNOSIS — M546 Pain in thoracic spine: Secondary | ICD-10-CM

## 2023-12-15 DIAGNOSIS — M810 Age-related osteoporosis without current pathological fracture: Secondary | ICD-10-CM

## 2023-12-15 DIAGNOSIS — R071 Chest pain on breathing: Secondary | ICD-10-CM

## 2023-12-15 NOTE — Assessment & Plan Note (Signed)
 Chronic cough of several months duration (post URI) has resolved completely. Mariah Farrell was taken to surgery yesterday, they could not intubate her because trachea was too narrow and the surgery was canceled - ENT appt is scheduled on Thursday this week. This could be related/aggravated by  tracheal compression due to goiter or something else

## 2023-12-15 NOTE — Telephone Encounter (Signed)
 Copied from CRM [id number]. Topic: Clinical - Medication Prior Auth >> Dec 15, 2023 10:36 AM Jasmin G wrote: Reason for CRM: [name] from [facility] is asking if patient needs prior authorization from Dr. For patient to get a CT chest scan. Please call her back at [phone] ext [phone].

## 2023-12-16 ENCOUNTER — Other Ambulatory Visit

## 2023-12-16 ENCOUNTER — Encounter: Payer: Self-pay | Admitting: Internal Medicine

## 2023-12-16 DIAGNOSIS — J309 Allergic rhinitis, unspecified: Secondary | ICD-10-CM | POA: Diagnosis not present

## 2023-12-16 DIAGNOSIS — K219 Gastro-esophageal reflux disease without esophagitis: Secondary | ICD-10-CM | POA: Diagnosis not present

## 2023-12-16 NOTE — Telephone Encounter (Signed)
 Copied from CRM [id number]. Topic: General - Other >> Dec 16, 2023  9:40 AM Thersia BROCKS wrote: Reason for CRM: Roselie from [facility] called in regarding courtesy call stating had to cancel for tomorrow has insurance is still pending approval

## 2023-12-17 ENCOUNTER — Other Ambulatory Visit

## 2023-12-22 ENCOUNTER — Ambulatory Visit: Admitting: Neurology

## 2023-12-22 VITALS — BP 117/69

## 2023-12-22 DIAGNOSIS — G243 Spasmodic torticollis: Secondary | ICD-10-CM

## 2023-12-22 MED ORDER — INCOBOTULINUMTOXINA 100 UNITS IM SOLR
100.0000 [IU] | INTRAMUSCULAR | Status: AC
Start: 2023-12-22 — End: ?

## 2023-12-22 NOTE — Progress Notes (Signed)
 xeomin  100units x 1 vial  [id number] [id number] Exp-2026/10  s/p Bacteriostatic 0.9% Sodium Chloride - 23mL  [id number] Expiration: 03-31-25 NDC: [id number] Dx: G24.3  WITNESSED BY:A JOSHUA RN

## 2023-12-22 NOTE — Progress Notes (Signed)
 Chief Complaint  Patient presents with   Injections    [facility]14 PT IS WELL AND READY FOR INJECTION      ASSESSMENT AND PLAN  Kmya Placide is a 83 y.o. female   Right-sided neck pain, radiating pain to right occipital region Multilevel cervical degenerative changes, most noticeable on the right side, Cervical dystonia Abnormal neck posturing, mild retrocollis, right tilt, mild right shoulder elevation, significant tenderness muscle spasm and right cervical paraspinal muscles,  She had right C1, C2 nerve block by neurosurgical pain management on April 16, 2022, with limited improvement  Higher dose of Cymbalta  40 mg helped her symptoms better, no significant side effect, further increase to 60 mg daily,  EMG guided xeomin  injection, used 100 units  ( dissolved into 4 cc of NS)   Right levator scapular 12.5units Right splenius cervix 25 units. Right splenius capitis 25 units  Right upper trapezius 12.5 units   Left levator scapular 25 units  DIAGNOSTIC DATA (LABS, IMAGING, TESTING) - I reviewed patient records, labs, notes, testing and imaging myself where available.   MEDICAL HISTORY:  Lucile Hillmann is a 83 year old female, seen in request by orthopedic surgeon Dr. Lucilla Agent for evaluation of right neck pain, she is accompanied by her husband Dr. Pepper, retired orthopedic surgeon at today's visit on April 08, 2022, PCP Plotnikov, Karlynn GAILS, MD   I reviewed and summarized the referring note. PMHx. Chronic insomnia Hypothyrodism GERD Lumbar decompression surgery Bilateral Rotator cuff Bilateral Knee replacement.  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 Pandora in August 2023, without significant improvement, pending nerve block by neurosurgical pain management of right C1, 2 on April 16, 2022,  She also tried warm compression, NSAIDs as needed, gabapentin  with limited help, the constant moderate to severe right neck deep achy pain, with occasionally sharp radiating pain to right occipital parietal region has affected life quality, hope to receive some relief  Update May 11, 2022 She had right C1-C2 facet joint injection with only very temporary relief, constant right upper cervical region deep achy pain, difficulty sleeping, limited her daily activity  She is taking Celebrex  200 mg as needed along with Valium  2.5 mg as needed, gabapentin  300 mg 1 to 2 tablets at nighttime, frequent Aleve,   In addition she complains of intermittent headaches, often triggered by sharp radiating pain from neck,  UPDATE August 12 2022; She has mild improvement with last injection, has transient difficulty raising up her neck, denies significant neck muscle weakness, in the last 2 weeks, has previous injection wearing off, she noticed increased neck pain, radiating to right shoulder  She tolerates Cymbalta  20 mg, which seems to help her symptoms  Personally reviewed MRI of brain in Feb 2024, mild small vessel disease.  UPDATE July 8th 2024: She tolerated higher dose of Cymbalta  40 mg, does help her neck pain, will further increase to 60 mg daily, last injection using Xeomin  200 units has really helped her right-sided neck pain, headache improved as well,  UPDATE Mar 10 2023: She responded  well to previous injection 200 units of xeomin , has helped her right-sided neck pain at least 75%, no significant side effect, at the end of the injection cycle did notice  recurrent neck pain,  UPDATE Jun 23 2023: She responded well to previous injection, there was no significant side effect noted.  UPDATE September 22 2023: Low-dose of xeomin  injection was helpful, she denies significant side effect  UPDATE December 22 2023: Cymbalta  and every 3 months low-dose Xeomin  injection has really helped her neck pain, she is on schedule for lumbar surgery  PHYSICAL EXAM:      12/22/2023    2:52 PM 12/14/2023    3:27 PM 11/03/2023    9:02 AM  Vitals with BMI  Height  5' 0 5' 0  Weight  189 lbs 186 lbs 13 oz  BMI  36.91 36.48  Systolic 117 110 869  Diastolic 69 70 84  Pulse  74 85    MOTOR: Abnormal neck posturing, mild right shoulder elevation, retrocollis, significant tenderness of cervical paraspinal muscles, at  bilateral levator scapula, especially at right upper cervical paraspinal muscles.    GAIT/STANCE: Need push-up to get up from seated position, cautious  REVIEW OF SYSTEMS:  Full 14 system review of systems performed and notable only for as above All other review of systems were negative.   ALLERGIES: Allergies  Allergen Reactions   Doxycycline Hives   Penicillins    Prolia  [Denosumab ]     Jaw necrosis wound   Cephalexin  Rash   Shellfish Allergy Dermatitis    (Only the shell- soft shelled crabs, etc)    HOME MEDICATIONS: Current Outpatient Medications  Medication Sig Dispense Refill   albuterol  (VENTOLIN  HFA) 108 (90 Base) MCG/ACT inhaler Inhale 2 puffs into the lungs every 4 (four) hours as needed for wheezing or shortness of breath. 8 g 0   aspirin -acetaminophen -caffeine (EXCEDRIN MIGRAINE) 250-250-65 MG tablet Take 1 tablet by mouth.     benzonatate  (TESSALON ) 200 MG capsule Take 1 capsule (200 mg total) by mouth 3 (three) times daily as needed for cough. 90 capsule 1   Biotin  5000 MCG TABS Take 1 tablet by mouth daily.     calcitonin, salmon, (MIACALCIN/FORTICAL) 200 UNIT/ACT nasal spray USE ONE SPRAY IN ONE NOSTRIL DAILY. ALTERNATE  NOSTRILS 11.1 mL 11   Calcium Carb-Cholecalciferol (OYSTER SHELL CALCIUM W/D) 500-5 MG-MCG TABS Take 1 tablet by mouth daily.     calcium-vitamin D  (OSCAL WITH D) 500-200 MG-UNIT per tablet Take 1 tablet by mouth daily.     cetirizine (ZYRTEC) 10 MG tablet Take 10 mg by mouth daily.     Cholecalciferol (VITAMIN D3) 2000 units capsule Take 1 capsule (2,000 Units total) by mouth daily. 100 capsule 3   diazepam  (VALIUM ) 5 MG tablet TAKE 1/2-1 TABLET BY MOUTH EVERY 12 HOURS AS NEEDED FOR ANXIETY, INSOMNIA 180 tablet 0   Diclofenac  Sodium (VOLTAREN EX) Apply topically as needed.     diphenhydrAMINE -PE-APAP 12.5-5-325 MG TABS Take 1 tablet by mouth 4 (four) times daily as needed. 56 tablet 1   DULoxetine  (CYMBALTA ) 60 MG capsule Take 1 capsule (60 mg total) by mouth daily. 90 capsule 3   estradiol (ESTRACE) 0.1 MG/GM vaginal cream Place 1 Applicatorful vaginally 2 (two) times a week.     furosemide  (LASIX ) 20 MG tablet Take 1 tablet by mouth daily.     gabapentin  (NEURONTIN ) 100 MG capsule Take 1 capsule (100 mg total) by mouth 3 (three) times daily as needed. 90 capsule 3  gabapentin  (NEURONTIN ) 300 MG capsule Take 1 capsule (300 mg total) by mouth at bedtime. 90 capsule 3   glucosamine-chondroitin 500-400 MG tablet Take 1 tablet by mouth daily.     HYDROcodone  bit-homatropine (HYCODAN) 5-1.5 MG/5ML syrup Take 5 mLs by mouth 4 (four) times daily as needed for cough (Alternate every 4-6 hours with promethazine  cough syrup). 240 mL 0   HYDROcodone -acetaminophen  (NORCO/VICODIN) 5-325 MG tablet Take 1 tablet by mouth every 6 (six) hours as needed for severe pain (pain score 7-10). 20 tablet 0   incobotulinumtoxinA  (XEOMIN ) 100 units SOLR injection Inject 400 Units into the muscle every 3 (three) months. Inject 400 units into the muscle every 90 days. 4 each 0   ipratropium (ATROVENT ) 0.03 % nasal spray Place 1 spray into both nostrils 4 (four) times daily. 30 mL 11   latanoprost (XALATAN) 0.005 %  ophthalmic solution SMARTSIG:In Eye(s)     levothyroxine  (SYNTHROID ) 25 MCG tablet TAKE 1 TABLET BY MOUTH DAILY BEFORE BREAKFAST 90 tablet 3   methylPREDNISolone  (MEDROL  DOSEPAK) 4 MG TBPK tablet As directed 21 tablet 0   Multiple Vitamin (MULTIVITAMIN) tablet Take 1 tablet by mouth daily.     pantoprazole  (PROTONIX ) 40 MG tablet Take 1 tablet (40 mg total) by mouth 2 (two) times daily. 180 tablet 3   promethazine -dextromethorphan (PROMETHAZINE -DM) 6.25-15 MG/5ML syrup Take 5 mLs by mouth 4 (four) times daily as needed for cough (Alternate every 4-6 hours with hydrocodone  cough syrup). 240 mL 0   timolol (TIMOPTIC) 0.5 % ophthalmic solution Place 1 drop into both eyes daily.     traMADol  (ULTRAM ) 50 MG tablet Take 1 tablet (50 mg total) by mouth every 6 (six) hours as needed. 120 tablet 1   vitamin C (ASCORBIC ACID) 500 MG tablet Take 1,000 mg by mouth daily.      Current Facility-Administered Medications  Medication Dose Route Frequency Provider Last Rate Last Admin   incobotulinumtoxinA  (XEOMIN ) 100 units injection 100 Units  100 Units Intramuscular Q90 days Onita Duos, MD   100 Units at 06/23/23 1340   incobotulinumtoxinA  (XEOMIN ) 100 units injection 100 Units  100 Units Intramuscular Once Onita Duos, MD        PAST MEDICAL HISTORY: Past Medical History:  Diagnosis Date   Arthritis    Cancer (HCC) 13 years ago   bladder   GERD (gastroesophageal reflux disease)    Goiter    synthroid  used to shrink   H/O hiatal hernia    Headache(784.0)    migraines in morning   PONV (postoperative nausea and vomiting)    Seasonal allergies     PAST SURGICAL HISTORY: Past Surgical History:  Procedure Laterality Date   ARTHRODESIS FOOT WITH WEIL OSTEOTOMY Right 07/26/2014   Procedure: RIGHT HALLUX METATARSOPHALANGEAL JOINT ARTHRODESIS; RIGHT SECOND METATARSAL WEIL OSTEOTOMY; DORSAL CAPSULOTOMY EXTENSOR TENDON LENGTHENING;  Surgeon: Norleen Armor, MD;  Location: [facility];  Service:  Orthopedics;  Laterality: Right;   BLADDER SURGERY  13 years ago   BUNIONECTOMY Bilateral    COLONOSCOPY     LUMBAR FUSION  May 2014   L4-L5   ROTATOR CUFF REPAIR Bilateral ~2010, 2012   SPINAL FUSION     L1, L2   TONSILLECTOMY  as child   TOTAL KNEE ARTHROPLASTY Right 07/19/2012   Procedure: TOTAL KNEE ARTHROPLASTY;  Surgeon: Donnice JONETTA Car, MD;  Location: [facility];  Service: Orthopedics;  Laterality: Right;   TOTAL KNEE ARTHROPLASTY Left 08/01/2013   Procedure: LEFT TOTAL KNEE ARTHROPLASTY;  Surgeon:  Donnice JONETTA Car, MD;  Location: [facility];  Service: Orthopedics;  Laterality: Left;    FAMILY HISTORY: Family History  Problem Relation Age of Onset   Cancer Brother 92       pancreatic   Brain cancer Father     SOCIAL HISTORY: Social History   Socioeconomic History   Marital status: Married    Spouse name: Philp   Number of children: 4   Years of education: Not on file   Highest education level: Some college, no degree  Occupational History   Occupation: RETIRED  Tobacco Use   Smoking status: Former    Current packs/day: 0.00    Average packs/day: 0.3 packs/day for 5.0 years (1.3 ttl pk-yrs)    Types: Cigarettes    Start date: 06/02/1959    Quit date: 06/01/1964    Years since quitting: 59.5   Smokeless tobacco: Never  Vaping Use   Vaping status: Never Used  Substance and Sexual Activity   Alcohol use: Yes    Comment: 1-2 times weekly   Drug use: No   Sexual activity: Not on file  Other Topics Concern   Not on file  Social History Narrative   Right handed   Caffeine-1 cup daily      Husband is a Dr.  She lives at home with husband and 1 daughter   Social Drivers of Corporate investment banker Strain: Low Risk  (12/14/2023)   Overall Financial Resource Strain (CARDIA)    Difficulty of Paying Living Expenses: Not hard at all  Food Insecurity: No Food Insecurity (12/14/2023)   Hunger Vital Sign    Worried About Running Out of Food in the Last Year: Never true    Ran  Out of Food in the Last Year: Never true  Transportation Needs: No Transportation Needs (12/14/2023)   PRAPARE - Administrator, Civil Service (Medical): No    Lack of Transportation (Non-Medical): No  Physical Activity: Inactive (12/14/2023)   Exercise Vital Sign    Days of Exercise per Week: 0 days    Minutes of Exercise per Session: Not on file  Stress: No Stress Concern Present (12/14/2023)   [employer] of Occupational Health - Occupational Stress Questionnaire    Feeling of Stress: Not at all  Social Connections: Socially Integrated (12/14/2023)   Social Connection and Isolation Panel    Frequency of Communication with Friends and Family: Twice a week    Frequency of Social Gatherings with Friends and Family: Three times a week    Attends Religious Services: More than 4 times per year    Active Member of Clubs or Organizations: Yes    Attends Banker Meetings: More than 4 times per year    Marital Status: Married  Catering manager Violence: Not At Risk (09/06/2023)   Humiliation, Afraid, Rape, and Kick questionnaire    Fear of Current or Ex-Partner: No    Emotionally Abused: No    Physically Abused: No    Sexually Abused: No      Modena Callander, M.D. Ph.D.  [facility] [address] [place], [place] [address] Ph: [phone] Fax: [phone]  CC:  Plotnikov, Karlynn GAILS, MD [address] [place],  [place] [address]  Plotnikov, Karlynn GAILS, MD

## 2023-12-30 ENCOUNTER — Encounter: Payer: Self-pay | Admitting: Internal Medicine

## 2023-12-30 NOTE — Telephone Encounter (Signed)
 Copied from CRM #[id number]. Topic: General - Other >> Dec 30, 2023 10:01 AM Thersia BROCKS wrote: Reason for CRM: Consuelo from [facility] calling in regarding patient CT Scan that was scheduled . Stated authorization was denied , wanted to know if a peer to peer was going to be done or do they need to just cancel the appointment  [phone]

## 2024-01-03 ENCOUNTER — Other Ambulatory Visit

## 2024-01-07 DIAGNOSIS — M5416 Radiculopathy, lumbar region: Secondary | ICD-10-CM | POA: Diagnosis not present

## 2024-01-07 DIAGNOSIS — M48062 Spinal stenosis, lumbar region with neurogenic claudication: Secondary | ICD-10-CM | POA: Diagnosis not present

## 2024-01-07 DIAGNOSIS — M810 Age-related osteoporosis without current pathological fracture: Secondary | ICD-10-CM | POA: Diagnosis not present

## 2024-01-07 DIAGNOSIS — M4310 Spondylolisthesis, site unspecified: Secondary | ICD-10-CM | POA: Diagnosis not present

## 2024-01-07 DIAGNOSIS — M4316 Spondylolisthesis, lumbar region: Secondary | ICD-10-CM | POA: Diagnosis not present

## 2024-01-07 DIAGNOSIS — I10 Essential (primary) hypertension: Secondary | ICD-10-CM | POA: Diagnosis not present

## 2024-01-07 DIAGNOSIS — M4317 Spondylolisthesis, lumbosacral region: Secondary | ICD-10-CM | POA: Diagnosis not present

## 2024-01-07 DIAGNOSIS — Z981 Arthrodesis status: Secondary | ICD-10-CM | POA: Diagnosis not present

## 2024-01-07 DIAGNOSIS — E039 Hypothyroidism, unspecified: Secondary | ICD-10-CM | POA: Diagnosis not present

## 2024-01-07 DIAGNOSIS — K219 Gastro-esophageal reflux disease without esophagitis: Secondary | ICD-10-CM | POA: Diagnosis not present

## 2024-01-07 DIAGNOSIS — M545 Low back pain, unspecified: Secondary | ICD-10-CM | POA: Diagnosis not present

## 2024-01-07 DIAGNOSIS — M549 Dorsalgia, unspecified: Secondary | ICD-10-CM | POA: Diagnosis not present

## 2024-01-07 DIAGNOSIS — Z9889 Other specified postprocedural states: Secondary | ICD-10-CM | POA: Diagnosis not present

## 2024-01-07 NOTE — Telephone Encounter (Signed)
 Please inform the patient.  She saw Dr. Llewellyn - nl exam. She saw Dr Onita. She is rescheduling her surgery as I understand. Do we need to persue radiology tests that were denied by her insurance? Thanks

## 2024-01-12 ENCOUNTER — Telehealth: Payer: Self-pay

## 2024-01-12 NOTE — Telephone Encounter (Signed)
 Copied from CRM [id number]. Topic: Clinical - Request for Lab/Test Order >> Jan 12, 2024  9:01 AM Burnard DEL wrote: Reason for CRM: [facility] called in to get clarification on which order patient is needing.They received one order In July and a new order today for  MRI thoracic spine with and without contrast.  Cb#:[phone]

## 2024-01-12 NOTE — Telephone Encounter (Signed)
 I reordered thoracic spine MRI.  Thanks

## 2024-01-13 NOTE — Telephone Encounter (Signed)
 Without contrast is correct.  I am sorry.  Thank you

## 2024-01-13 NOTE — Telephone Encounter (Signed)
 Spoke with Consuelo from [facility] and was able to inform her of the providers request clarification I reordered thoracic spine MRI w/o contrast

## 2024-01-14 ENCOUNTER — Other Ambulatory Visit

## 2024-01-15 ENCOUNTER — Other Ambulatory Visit: Payer: Self-pay | Admitting: Orthopaedic Surgery

## 2024-01-19 ENCOUNTER — Telehealth: Payer: Self-pay | Admitting: Internal Medicine

## 2024-01-19 ENCOUNTER — Other Ambulatory Visit: Payer: Self-pay | Admitting: Internal Medicine

## 2024-01-19 MED ORDER — FUROSEMIDE 20 MG PO TABS: 20.0000 mg | ORAL_TABLET | Freq: Every day | ORAL | 3 refills | Status: DC | PRN

## 2024-01-19 MED ORDER — HYDROMORPHONE HCL 2 MG PO TABS: 2.0000 mg | ORAL_TABLET | Freq: Four times a day (QID) | ORAL | 0 refills | Status: AC | PRN

## 2024-01-19 NOTE — Telephone Encounter (Signed)
 Mariah Farrell had back surgery -  spinal fusion in [place] on July 8.  She was prescribed hydrocodone  that is not working per Dr. Franchot.  He requested Dilaudid  2 mg 14 tablets for pain relief.  Will do.  Thanks

## 2024-01-24 DIAGNOSIS — M810 Age-related osteoporosis without current pathological fracture: Secondary | ICD-10-CM | POA: Diagnosis not present

## 2024-01-24 DIAGNOSIS — Z981 Arthrodesis status: Secondary | ICD-10-CM | POA: Diagnosis not present

## 2024-01-24 DIAGNOSIS — Z4789 Encounter for other orthopedic aftercare: Secondary | ICD-10-CM | POA: Diagnosis not present

## 2024-01-24 DIAGNOSIS — Z79891 Long term (current) use of opiate analgesic: Secondary | ICD-10-CM | POA: Diagnosis not present

## 2024-01-24 DIAGNOSIS — Z8551 Personal history of malignant neoplasm of bladder: Secondary | ICD-10-CM | POA: Diagnosis not present

## 2024-01-24 DIAGNOSIS — M5417 Radiculopathy, lumbosacral region: Secondary | ICD-10-CM | POA: Diagnosis not present

## 2024-01-24 DIAGNOSIS — Z604 Social exclusion and rejection: Secondary | ICD-10-CM | POA: Diagnosis not present

## 2024-01-24 DIAGNOSIS — M1990 Unspecified osteoarthritis, unspecified site: Secondary | ICD-10-CM | POA: Diagnosis not present

## 2024-01-27 ENCOUNTER — Ambulatory Visit: Payer: Self-pay

## 2024-01-27 NOTE — Telephone Encounter (Signed)
 FYI Only or Action Required?: Action required by provider: update on patient condition.  Patient was last seen in primary care on 12/14/2023 by Plotnikov, Karlynn GAILS, MD.  Called Nurse Triage reporting Advice Only.  Symptoms began today.  Interventions attempted: Nothing.  Symptoms are: BP same O2 when leaving in low 90's.  Triage Disposition: No disposition on file.  Patient/caregiver understands and will follow disposition?: Yes   Copied from CRM [id number]. Topic: Clinical - Red Word Triage >> Jan 27, 2024  2:42 PM Rosina BIRCH wrote: Reason for RMF:uzmmb from [facility] called stating the patient vitals signs are abnormal 120/53 and her oxygen is dropping into the 80's. Jerel stated the patient incision does not look great but he did not want to argue with the husband who is a retired Careers adviser. The husband stated the incision looked great [phone] Additional Information  Commented on: Health information question, no triage required and triager able to answer question    Routing information to PCP to review  Answer Assessment - Initial Assessment Questions 1. REASON FOR CALL: What is the main reason for your call? or How can I best help you?    Reason for RMF:uzmmb from [facility] called stating the patient vitals signs are abnormal 120/53 and her oxygen is dropping into the 80's. Jerel stated the patient incision does not look great but he did not want to argue with the husband who is a retired Careers adviser. The husband stated the incision looked great CB [phone]   Nurse spoke with Terry,PT from [facility]- stated the pt's wound does not look well at all.  Pt is not having s/s r/t low oxygen and BP 120/53. , no distress or discomfort at present time.   Routing this information to PCP to review and follow up if needed.  Protocols used: Information Only Call - No Triage-A-AH

## 2024-01-27 NOTE — Telephone Encounter (Signed)
 FYI Only or Action Required?: FYI only for provider.  Patient was last seen in primary care on 12/14/2023 by Plotnikov, Karlynn GAILS, MD.  Called Nurse Triage reporting Shortness of Breath.  Symptoms began today.  Interventions attempted: Nothing.  Symptoms are: unchanged.  Triage Disposition: Call PCP Now  Patient/caregiver understands and will follow disposition?: No, wishes to speak with PCP  Copied from CRM #[id number]. Topic: Clinical - Red Word Triage >> Jan 27, 2024  2:44 PM Burnard DEL wrote: Red Word that prompted transfer to Nurse Triage: chest pain on last night,low oxygen readings today Reason for Disposition  Oxygen level (e.g., pulse oximetry) 91 to 94%  Answer Assessment - Initial Assessment Questions 1. RESPIRATORY STATUS: Describe your breathing? (e.g., wheezing, shortness of breath, unable to speak, severe coughing)      Denies SOB, denies cough, low pulse ox worse with exertion 2. ONSET: When did this breathing problem begin?      Last night 3. PATTERN Does the difficult breathing come and go, or has it been constant since it started?      Ongoing since yesterday 5. RECURRENT SYMPTOM: Have you had difficulty breathing before? If Yes, ask: When was the last time? and What happened that time?      Smoker when  younger 68. CARDIAC HISTORY: Do you have any history of heart disease? (e.g., heart attack, angina, bypass surgery, angioplasty)      Denies 7. LUNG HISTORY: Do you have any history of lung disease?  (e.g., pulmonary embolus, asthma, emphysema)     denies 8. CAUSE: What do you think is causing the breathing problem?      Unsure, PE,  9. OTHER SYMPTOMS: Do you have any other symptoms? (e.g., chest pain, cough, dizziness, fever, runny nose)     denies 10. O2 SATURATION MONITOR:  Do you use an oxygen saturation monitor (pulse oximeter) at home? If Yes, ask: What is your reading (oxygen level) today? What is your usual oxygen saturation reading?  (e.g., 95%)       91-92, mid 80s with exertion  Husband calling on behalf of pt, offered appt today, refused d/t transportation issues. RN advised the UC would also be an option d/t pt needing to be seen today, caller refused to utilize UC. States that he will only have her see clinic staff. Scheduled for tomorrow morning.  Protocols used: Breathing Difficulty-A-AH

## 2024-01-28 ENCOUNTER — Other Ambulatory Visit: Payer: Self-pay | Admitting: Internal Medicine

## 2024-01-28 ENCOUNTER — Ambulatory Visit

## 2024-01-28 ENCOUNTER — Ambulatory Visit: Payer: Self-pay | Admitting: Internal Medicine

## 2024-01-28 ENCOUNTER — Ambulatory Visit: Admitting: Internal Medicine

## 2024-01-28 ENCOUNTER — Encounter: Payer: Self-pay | Admitting: Internal Medicine

## 2024-01-28 VITALS — BP 136/78 | HR 73 | Temp 98.0°F | Ht 60.0 in

## 2024-01-28 DIAGNOSIS — R7989 Other specified abnormal findings of blood chemistry: Secondary | ICD-10-CM

## 2024-01-28 DIAGNOSIS — M546 Pain in thoracic spine: Secondary | ICD-10-CM | POA: Diagnosis not present

## 2024-01-28 DIAGNOSIS — D649 Anemia, unspecified: Secondary | ICD-10-CM | POA: Diagnosis not present

## 2024-01-28 DIAGNOSIS — R0902 Hypoxemia: Secondary | ICD-10-CM

## 2024-01-28 DIAGNOSIS — M419 Scoliosis, unspecified: Secondary | ICD-10-CM | POA: Diagnosis not present

## 2024-01-28 DIAGNOSIS — R079 Chest pain, unspecified: Secondary | ICD-10-CM

## 2024-01-28 DIAGNOSIS — M47814 Spondylosis without myelopathy or radiculopathy, thoracic region: Secondary | ICD-10-CM | POA: Diagnosis not present

## 2024-01-28 LAB — CBC WITH DIFFERENTIAL/PLATELET
Basophils Absolute: 0.1 K/uL (ref 0.0–0.1)
Basophils Relative: 1 % (ref 0.0–3.0)
Eosinophils Absolute: 0.2 K/uL (ref 0.0–0.7)
Eosinophils Relative: 2.9 % (ref 0.0–5.0)
HCT: 30.9 % — ABNORMAL LOW (ref 36.0–46.0)
Hemoglobin: 10.4 g/dL — ABNORMAL LOW (ref 12.0–15.0)
Lymphocytes Relative: 10.6 % — ABNORMAL LOW (ref 12.0–46.0)
Lymphs Abs: 0.6 K/uL — ABNORMAL LOW (ref 0.7–4.0)
MCHC: 33.6 g/dL (ref 30.0–36.0)
MCV: 95 fl (ref 78.0–100.0)
Monocytes Absolute: 0.5 K/uL (ref 0.1–1.0)
Monocytes Relative: 9 % (ref 3.0–12.0)
Neutro Abs: 4.5 K/uL (ref 1.4–7.7)
Neutrophils Relative %: 76.5 % (ref 43.0–77.0)
Platelets: 416 K/uL — ABNORMAL HIGH (ref 150.0–400.0)
RBC: 3.26 Mil/uL — ABNORMAL LOW (ref 3.87–5.11)
RDW: 15.4 % (ref 11.5–15.5)
WBC: 5.8 K/uL (ref 4.0–10.5)

## 2024-01-28 LAB — BASIC METABOLIC PANEL WITH GFR
BUN: 17 mg/dL (ref 6–23)
CO2: 30 meq/L (ref 19–32)
Calcium: 9 mg/dL (ref 8.4–10.5)
Chloride: 91 meq/L — ABNORMAL LOW (ref 96–112)
Creatinine, Ser: 1.02 mg/dL (ref 0.40–1.20)
GFR: 50.29 mL/min — ABNORMAL LOW (ref >60.00)
Glucose, Bld: 105 mg/dL — ABNORMAL HIGH (ref 70–99)
Potassium: 4 meq/L (ref 3.5–5.1)
Sodium: 132 meq/L — ABNORMAL LOW (ref 135–145)

## 2024-01-28 LAB — HEPATIC FUNCTION PANEL
ALT: 12 U/L (ref 0–35)
AST: 19 U/L (ref 0–37)
Albumin: 3.8 g/dL (ref 3.5–5.2)
Alkaline Phosphatase: 115 U/L (ref 39–117)
Bilirubin, Direct: 0.2 mg/dL (ref 0.0–0.3)
Total Bilirubin: 0.5 mg/dL (ref 0.2–1.2)
Total Protein: 6.4 g/dL (ref 6.0–8.3)

## 2024-01-28 LAB — BRAIN NATRIURETIC PEPTIDE: Pro B Natriuretic peptide (BNP): 135 pg/mL — ABNORMAL HIGH (ref 0.0–100.0)

## 2024-01-28 LAB — IBC PANEL
Iron: 38 ug/dL — ABNORMAL LOW (ref 42–145)
Saturation Ratios: 14.2 % — ABNORMAL LOW (ref 20.0–50.0)
TIBC: 267.4 ug/dL (ref 250.0–450.0)
Transferrin: 191 mg/dL — ABNORMAL LOW (ref 212.0–360.0)

## 2024-01-28 LAB — FERRITIN: Ferritin: 156.8 ng/mL (ref 10.0–291.0)

## 2024-01-28 LAB — D-DIMER, QUANTITATIVE: D-Dimer, Quant: 2.45 ug{FEU}/mL — ABNORMAL HIGH (ref <0.50)

## 2024-01-28 MED ORDER — TRAMADOL HCL 50 MG PO TABS: 50.0000 mg | ORAL_TABLET | Freq: Four times a day (QID) | ORAL | 1 refills | Status: AC | PRN

## 2024-01-28 NOTE — Progress Notes (Signed)
 Patient ID: Mariah Farrell, female   DOB: April 19, 1941, 83 y.o.   MRN: [id number]        Chief Complaint: follow up with retired orthopedic husband with excellent history given patient memory challenges       HPI:  Mariah Farrell is a 83 y.o. female here now 3 wks s/p lumbar spine fusion, overall doing well without s/s infection or other immediate complication except for borderline intermittent hypoxia low sats 88 - 90% or better at home. Did also have significant post surgical anemia s/p 2 u PRBC during hospn.    Husband states no resp distress and did not go to ED, and declines any referral now.  Has chronic cough x 4 mo nonproductive for unclear reason, but no change.  Does have mild nasal allergies and post nasal gtt.  Pt without hx of DVT PE .  Did take 4 days low dose heparin at hospital d/c. But none after and no ASA to avoid lesser healing of the surgical site.  Pt denies chest pain, increased sob or doe, wheezing, orthopnea, PND, palpitations, dizziness or syncope, but has chronic edema bilat LE no change. Of most immediate concern and the reason she is here is acute pain, severe at times for unclear reason, primarily in the mid thoracic area between shoulder blades but no recent falls or injury.  Pain does seems to radiate around bilateral to mid sternal area as well.   Pt denies polydipsia, polyuria, or new focal neuro s/s.  Pt is continuing with PT, walks with walker today       Wt Readings from Last 3 Encounters:  12/14/23 189 lb (85.7 kg)  11/03/23 186 lb 12.8 oz (84.7 kg)  09/14/23 188 lb 12.8 oz (85.6 kg)   BP Readings from Last 3 Encounters:  01/28/24 136/78  12/22/23 117/69  12/14/23 110/70         Past Medical History:  Diagnosis Date   Allergy .?   Seasonal   Arthritis    Cancer (HCC) 13 years ago   bladder   Cataract 2023   1 implant   GERD (gastroesophageal reflux disease)    Glaucoma 2020   Goiter    synthroid  used to shrink   H/O hiatal hernia     Headache(784.0)    migraines in morning   Hyperlipidemia .?   Occasionally   Neuromuscular disorder (HCC) 2023   Occipital    [place]   PONV (postoperative nausea and vomiting)    Seasonal allergies    Past Surgical History:  Procedure Laterality Date   ARTHRODESIS FOOT WITH WEIL OSTEOTOMY Right 07/26/2014   Procedure: RIGHT HALLUX METATARSOPHALANGEAL JOINT ARTHRODESIS; RIGHT SECOND METATARSAL WEIL OSTEOTOMY; DORSAL CAPSULOTOMY EXTENSOR TENDON LENGTHENING;  Surgeon: Norleen Armor, MD;  Location: [facility];  Service: Orthopedics;  Laterality: Right;   BLADDER SURGERY  13 years ago   BUNIONECTOMY Bilateral    COLONOSCOPY     JOINT REPLACEMENT  .?   LUMBAR FUSION  09/2012   L4-L5   ROTATOR CUFF REPAIR Bilateral ~2010, 2012   SPINAL FUSION     L1, L2   TONSILLECTOMY  as child   TOTAL KNEE ARTHROPLASTY Right 07/19/2012   Procedure: TOTAL KNEE ARTHROPLASTY;  Surgeon: Donnice JONETTA Car, MD;  Location: [facility];  Service: Orthopedics;  Laterality: Right;   TOTAL KNEE ARTHROPLASTY Left 08/01/2013   Procedure: LEFT TOTAL KNEE ARTHROPLASTY;  Surgeon: Donnice JONETTA Car, MD;  Location: [facility];  Service: Orthopedics;  Laterality:  Left;    reports that she quit smoking about 59 years ago. Her smoking use included cigarettes. She started smoking about 64 years ago. She has a 1.3 pack-year smoking history. She has never used smokeless tobacco. She reports current alcohol use. She reports that she does not use drugs. family history includes Alcohol abuse in her brother; Arthritis in her maternal grandmother; Brain cancer in her father; Cancer in her daughter, daughter, daughter, and father; Cancer (age of onset: 41) in her brother; Depression in her daughter; Early death in her brother. Allergies  Allergen Reactions   Doxycycline Hives   Penicillins    Prolia  [Denosumab ]     Jaw necrosis wound   Cephalexin  Rash   Shellfish Allergy Dermatitis    (Only the shell- soft shelled crabs, etc)    Current Outpatient Medications on File Prior to Visit  Medication Sig Dispense Refill   albuterol  (VENTOLIN  HFA) 108 (90 Base) MCG/ACT inhaler Inhale 2 puffs into the lungs every 4 (four) hours as needed for wheezing or shortness of breath. 8 g 0   aspirin -acetaminophen -caffeine (EXCEDRIN MIGRAINE) 250-250-65 MG tablet Take 1 tablet by mouth.     benzonatate  (TESSALON ) 200 MG capsule Take 1 capsule (200 mg total) by mouth 3 (three) times daily as needed for cough. 90 capsule 1   Biotin  5000 MCG TABS Take 1 tablet by mouth daily.     calcitonin, salmon, (MIACALCIN/FORTICAL) 200 UNIT/ACT nasal spray USE ONE SPRAY IN ONE NOSTRIL DAILY. ALTERNATE NOSTRILS 11.1 mL 11   Calcium Carb-Cholecalciferol (OYSTER SHELL CALCIUM W/D) 500-5 MG-MCG TABS Take 1 tablet by mouth daily.     calcium-vitamin D  (OSCAL WITH D) 500-200 MG-UNIT per tablet Take 1 tablet by mouth daily.     cetirizine (ZYRTEC) 10 MG tablet Take 10 mg by mouth daily.     Cholecalciferol (VITAMIN D3) 2000 units capsule Take 1 capsule (2,000 Units total) by mouth daily. 100 capsule 3   diazepam  (VALIUM ) 5 MG tablet TAKE 1/2-1 TABLET BY MOUTH EVERY 12 HOURS AS NEEDED FOR ANXIETY, INSOMNIA 180 tablet 0   Diclofenac  Sodium (VOLTAREN EX) Apply topically as needed.     diphenhydrAMINE -PE-APAP 12.5-5-325 MG TABS Take 1 tablet by mouth 4 (four) times daily as needed. 56 tablet 1   DULoxetine  (CYMBALTA ) 60 MG capsule Take 1 capsule (60 mg total) by mouth daily. 90 capsule 3   estradiol (ESTRACE) 0.1 MG/GM vaginal cream Place 1 Applicatorful vaginally 2 (two) times a week.     furosemide  (LASIX ) 20 MG tablet Take 1-2 tablets (20-40 mg total) by mouth daily as needed. 60 tablet 3   gabapentin  (NEURONTIN ) 100 MG capsule Take 1 capsule (100 mg total) by mouth 3 (three) times daily as needed. 90 capsule 3   gabapentin  (NEURONTIN ) 300 MG capsule Take 1 capsule (300 mg total) by mouth at bedtime. 90 capsule 3   glucosamine-chondroitin 500-400 MG tablet  Take 1 tablet by mouth daily.     HYDROmorphone  (DILAUDID ) 2 MG tablet Take 1 tablet (2 mg total) by mouth every 6 (six) hours as needed for severe pain (pain score 7-10). 14 tablet 0   incobotulinumtoxinA  (XEOMIN ) 100 units SOLR injection Inject 400 Units into the muscle every 3 (three) months. Inject 400 units into the muscle every 90 days. 4 each 0   ipratropium (ATROVENT ) 0.03 % nasal spray Place 1 spray into both nostrils 4 (four) times daily. 30 mL 11   latanoprost (XALATAN) 0.005 % ophthalmic solution SMARTSIG:In Eye(s)  levothyroxine  (SYNTHROID ) 25 MCG tablet TAKE 1 TABLET BY MOUTH DAILY BEFORE BREAKFAST 90 tablet 3   methylPREDNISolone  (MEDROL  DOSEPAK) 4 MG TBPK tablet As directed 21 tablet 0   Multiple Vitamin (MULTIVITAMIN) tablet Take 1 tablet by mouth daily.     pantoprazole  (PROTONIX ) 40 MG tablet Take 1 tablet (40 mg total) by mouth 2 (two) times daily. 180 tablet 3   promethazine -dextromethorphan (PROMETHAZINE -DM) 6.25-15 MG/5ML syrup Take 5 mLs by mouth 4 (four) times daily as needed for cough (Alternate every 4-6 hours with hydrocodone  cough syrup). 240 mL 0   timolol (TIMOPTIC) 0.5 % ophthalmic solution Place 1 drop into both eyes daily.     vitamin C (ASCORBIC ACID) 500 MG tablet Take 1,000 mg by mouth daily.      Current Facility-Administered Medications on File Prior to Visit  Medication Dose Route Frequency Provider Last Rate Last Admin   incobotulinumtoxinA  (XEOMIN ) 100 units injection 100 Units  100 Units Intramuscular Q90 days Onita Duos, MD   100 Units at 06/23/23 1340   incobotulinumtoxinA  (XEOMIN ) 100 units injection 100 Units  100 Units Intramuscular Once Onita Duos, MD       incobotulinumtoxinA  (XEOMIN ) 100 units injection 100 Units  100 Units Intramuscular Q90 days Onita Duos, MD            ROS:  All others reviewed and negative.  Objective        PE:  BP 136/78   Pulse 73   Temp 98 F (36.7 C)   Ht 5' (1.524 m)   SpO2 91%   BMI 36.91 kg/m                  Constitutional: Pt appears in NAD               HENT: Head: NCAT.                Right Ear: External ear normal.                 Left Ear: External ear normal.                Eyes: . Pupils are equal, round, and reactive to light. Conjunctivae and EOM are normal               Nose: without d/c or deformity               Neck: Neck supple. Gross normal ROM               Cardiovascular: Normal rate and regular rhythm.                 Pulmonary/Chest: Effort normal and breath sounds without rales or wheezing.                Abd:  Soft, NT, ND, + BS, no organomegaly               Neurological: Pt is alert. At baseline orientation, motor grossly intact               Skin: Skin is warm. No rashes, no other new lesions, LE edema - none               Thoracic spine non tender in midline without swelling. Lumbar incision site well healing               Psychiatric: Pt behavior is normal without agitation   Micro: none  Cardiac tracings I  have personally interpreted today:  none  Pertinent Radiological findings (summarize): none   Lab Results  Component Value Date   WBC 5.8 01/28/2024   HGB 10.4 (L) 01/28/2024   HCT 30.9 (L) 01/28/2024   PLT 416.0 (H) 01/28/2024   GLUCOSE 105 (H) 01/28/2024   CHOL 199 06/24/2022   TRIG 144.0 06/24/2022   HDL 55.00 06/24/2022   LDLCALC 115 (H) 06/24/2022   ALT 12 01/28/2024   AST 19 01/28/2024   NA 132 (L) 01/28/2024   K 4.0 01/28/2024   CL 91 (L) 01/28/2024   CREATININE 1.02 01/28/2024   BUN 17 01/28/2024   CO2 30 01/28/2024   TSH 0.83 06/24/2022   INR 1.0 04/10/2021   Assessment/Plan:  Mariah Farrell is a 83 y.o. White or Caucasian [1] female with  has a past medical history of Allergy (.?), Arthritis, Cancer (HCC) (13 years ago), Cataract (2023), GERD (gastroesophageal reflux disease), Glaucoma (2020), Goiter, H/O hiatal hernia, Headache(784.0), Hyperlipidemia (.?), Neuromuscular disorder (HCC) (2023), PONV (postoperative nausea and  vomiting), and Seasonal allergies.  Anemia Pt with no overt bleeding, now for f/u CBC, iron levels  Hypoxia Borderline low, no resp distress per husband, but for d dimer and BNP with labs  Acute bilateral thoracic back pain Exam benign but pt with subjective severe pain recently, now for t spine films given hx of osteoporosis, also fro tramadol  prn pain  Chest pain Atypical, very low suspicion for cardiac, also for CXR  Followup: Return in about 4 weeks (around 02/25/2024) for to PCP.  Lynwood Rush, MD 01/30/2024 11:03 AM [facility]

## 2024-01-28 NOTE — Patient Instructions (Signed)
 Please take all new medication as prescribed - the tramadol  as needed for pain  Please continue all other medications as before, and refills have been done if requested.  Please have the pharmacy call with any other refills you may need  Please keep your appointments with your specialists as you may have planned  Please go to the XRAY Department in the first floor for the x-ray testing  Please go to the LAB at the blood drawing area for the tests to be done  You will be contacted by phone if any changes need to be made immediately.  Otherwise, you will receive a letter about your results with an explanation, but please check with MyChart first.  Please see Dr Garald in 1 month, or sooner if needed

## 2024-01-30 ENCOUNTER — Encounter: Payer: Self-pay | Admitting: Internal Medicine

## 2024-01-30 DIAGNOSIS — M546 Pain in thoracic spine: Secondary | ICD-10-CM | POA: Insufficient documentation

## 2024-01-30 DIAGNOSIS — R0902 Hypoxemia: Secondary | ICD-10-CM | POA: Insufficient documentation

## 2024-01-30 DIAGNOSIS — D649 Anemia, unspecified: Secondary | ICD-10-CM | POA: Insufficient documentation

## 2024-01-30 DIAGNOSIS — R079 Chest pain, unspecified: Secondary | ICD-10-CM | POA: Insufficient documentation

## 2024-01-30 NOTE — Assessment & Plan Note (Addendum)
 Pt with no overt bleeding, now for f/u CBC, iron levels

## 2024-01-30 NOTE — Assessment & Plan Note (Addendum)
 Exam benign but pt with subjective severe pain recently, now for t spine films given hx of osteoporosis, also fro tramadol  prn pain

## 2024-01-30 NOTE — Assessment & Plan Note (Signed)
 Atypical, very low suspicion for cardiac, also for CXR

## 2024-01-30 NOTE — Assessment & Plan Note (Signed)
 Borderline low, no resp distress per husband, but for d dimer and BNP with labs

## 2024-02-01 ENCOUNTER — Telehealth: Payer: Self-pay

## 2024-02-01 ENCOUNTER — Encounter: Payer: Self-pay | Admitting: Internal Medicine

## 2024-02-01 ENCOUNTER — Ambulatory Visit: Admitting: Internal Medicine

## 2024-02-01 VITALS — BP 128/60 | HR 71 | Temp 97.7°F | Ht 60.0 in | Wt 187.0 lb

## 2024-02-01 DIAGNOSIS — D649 Anemia, unspecified: Secondary | ICD-10-CM | POA: Diagnosis not present

## 2024-02-01 DIAGNOSIS — R0902 Hypoxemia: Secondary | ICD-10-CM | POA: Diagnosis not present

## 2024-02-01 DIAGNOSIS — R053 Chronic cough: Secondary | ICD-10-CM

## 2024-02-01 DIAGNOSIS — R0609 Other forms of dyspnea: Secondary | ICD-10-CM

## 2024-02-01 DIAGNOSIS — M545 Low back pain, unspecified: Secondary | ICD-10-CM | POA: Diagnosis not present

## 2024-02-01 DIAGNOSIS — M546 Pain in thoracic spine: Secondary | ICD-10-CM | POA: Diagnosis not present

## 2024-02-01 MED ORDER — TORSEMIDE 20 MG PO TABS: 20.0000 mg | ORAL_TABLET | Freq: Every day | ORAL | 3 refills | Status: AC

## 2024-02-01 MED ORDER — HYDROCODONE-ACETAMINOPHEN 10-325 MG PO TABS: 1.0000 | ORAL_TABLET | ORAL | 0 refills | Status: AC | PRN

## 2024-02-01 NOTE — Assessment & Plan Note (Addendum)
 Fluid retention, probable CHF She is 3 wks post-op lumbar spine fusion Start Demadex  20-40 mg/d. Hydrate well for CT scan tomorrow Discontinue furosemide .

## 2024-02-01 NOTE — Assessment & Plan Note (Signed)
 Resolved

## 2024-02-01 NOTE — Progress Notes (Signed)
 Subjective:  Patient ID: Mariah Farrell, female    DOB: December 08, 1940  Age: 83 y.o. MRN: [id number]  CC: Hospitalization Follow-up (Still having a lot of pain; want to discuss chest xray)   HPI Mariah Farrell presents for back pain, thoracic back pain, anemia.  She is here with feel her husband More upper back pain since Wed night last week On Norco for LBP 5/325 2 tab q4h prn, Tramadol  (LBP6/10 w/meds, 10/10 w/o meds) -status post lumbar spine fusion 3 weeks ago On Gabapentin  900 mg/d O2 was low 88-89% on RA with physical therapy C/o edema and fluid retention C/o frequent soft stools and a lot of gas.  She is on MiraLAX  and senna tablet for regularity Recent labs with elevated BNP and elevated D-dimer GFR is better  Outpatient Medications Prior to Visit  Medication Sig Dispense Refill   albuterol  (VENTOLIN  HFA) 108 (90 Base) MCG/ACT inhaler Inhale 2 puffs into the lungs every 4 (four) hours as needed for wheezing or shortness of breath. 8 g 0   aspirin -acetaminophen -caffeine (EXCEDRIN MIGRAINE) 250-250-65 MG tablet Take 1 tablet by mouth.     benzonatate  (TESSALON ) 200 MG capsule Take 1 capsule (200 mg total) by mouth 3 (three) times daily as needed for cough. 90 capsule 1   Biotin  5000 MCG TABS Take 1 tablet by mouth daily.     calcitonin, salmon, (MIACALCIN/FORTICAL) 200 UNIT/ACT nasal spray USE ONE SPRAY IN ONE NOSTRIL DAILY. ALTERNATE NOSTRILS 11.1 mL 11   Calcium Carb-Cholecalciferol (OYSTER SHELL CALCIUM W/D) 500-5 MG-MCG TABS Take 1 tablet by mouth daily.     calcium-vitamin D  (OSCAL WITH D) 500-200 MG-UNIT per tablet Take 1 tablet by mouth daily.     cetirizine (ZYRTEC) 10 MG tablet Take 10 mg by mouth daily.     Cholecalciferol (VITAMIN D3) 2000 units capsule Take 1 capsule (2,000 Units total) by mouth daily. 100 capsule 3   diazepam  (VALIUM ) 5 MG tablet TAKE 1/2-1 TABLET BY MOUTH EVERY 12 HOURS AS NEEDED FOR ANXIETY, INSOMNIA 180 tablet 0   Diclofenac  Sodium  (VOLTAREN EX) Apply topically as needed.     diphenhydrAMINE -PE-APAP 12.5-5-325 MG TABS Take 1 tablet by mouth 4 (four) times daily as needed. 56 tablet 1   DULoxetine  (CYMBALTA ) 60 MG capsule Take 1 capsule (60 mg total) by mouth daily. 90 capsule 3   estradiol (ESTRACE) 0.1 MG/GM vaginal cream Place 1 Applicatorful vaginally 2 (two) times a week.     gabapentin  (NEURONTIN ) 100 MG capsule Take 1 capsule (100 mg total) by mouth 3 (three) times daily as needed. 90 capsule 3   gabapentin  (NEURONTIN ) 300 MG capsule Take 1 capsule (300 mg total) by mouth at bedtime. 90 capsule 3   glucosamine-chondroitin 500-400 MG tablet Take 1 tablet by mouth daily.     HYDROmorphone  (DILAUDID ) 2 MG tablet Take 1 tablet (2 mg total) by mouth every 6 (six) hours as needed for severe pain (pain score 7-10). 14 tablet 0   incobotulinumtoxinA  (XEOMIN ) 100 units SOLR injection Inject 400 Units into the muscle every 3 (three) months. Inject 400 units into the muscle every 90 days. 4 each 0   ipratropium (ATROVENT ) 0.03 % nasal spray Place 1 spray into both nostrils 4 (four) times daily. 30 mL 11   latanoprost (XALATAN) 0.005 % ophthalmic solution SMARTSIG:In Eye(s)     levothyroxine  (SYNTHROID ) 25 MCG tablet TAKE 1 TABLET BY MOUTH DAILY BEFORE BREAKFAST 90 tablet 3   methylPREDNISolone  (MEDROL  DOSEPAK) 4 MG TBPK tablet  As directed 21 tablet 0   Multiple Vitamin (MULTIVITAMIN) tablet Take 1 tablet by mouth daily.     pantoprazole  (PROTONIX ) 40 MG tablet Take 1 tablet (40 mg total) by mouth 2 (two) times daily. 180 tablet 3   promethazine -dextromethorphan (PROMETHAZINE -DM) 6.25-15 MG/5ML syrup Take 5 mLs by mouth 4 (four) times daily as needed for cough (Alternate every 4-6 hours with hydrocodone  cough syrup). 240 mL 0   timolol (TIMOPTIC) 0.5 % ophthalmic solution Place 1 drop into both eyes daily.     traMADol  (ULTRAM ) 50 MG tablet Take 1 tablet (50 mg total) by mouth every 6 (six) hours as needed. 120 tablet 1   vitamin C  (ASCORBIC ACID) 500 MG tablet Take 1,000 mg by mouth daily.      furosemide  (LASIX ) 20 MG tablet Take 1-2 tablets (20-40 mg total) by mouth daily as needed. 60 tablet 3   Facility-Administered Medications Prior to Visit  Medication Dose Route Frequency Provider Last Rate Last Admin   incobotulinumtoxinA  (XEOMIN ) 100 units injection 100 Units  100 Units Intramuscular Q90 days Onita Duos, MD   100 Units at 06/23/23 1340   incobotulinumtoxinA  (XEOMIN ) 100 units injection 100 Units  100 Units Intramuscular Once Onita Duos, MD       incobotulinumtoxinA  (XEOMIN ) 100 units injection 100 Units  100 Units Intramuscular Q90 days Onita Duos, MD        ROS: Review of Systems  Constitutional:  Positive for fatigue. Negative for activity change, appetite change, chills and unexpected weight change.  HENT:  Positive for congestion. Negative for mouth sores and sinus pressure.   Eyes:  Negative for visual disturbance.  Respiratory:  Positive for cough and shortness of breath. Negative for chest tightness.   Cardiovascular:  Positive for leg swelling.  Gastrointestinal:  Negative for abdominal pain and nausea.  Genitourinary:  Negative for difficulty urinating, frequency and vaginal pain.  Musculoskeletal:  Positive for arthralgias, back pain and gait problem.  Skin:  Negative for pallor and rash.  Neurological:  Negative for dizziness, tremors, weakness, numbness and headaches.  Psychiatric/Behavioral:  Negative for confusion and sleep disturbance.     Objective:  BP 128/60   Pulse 71   Temp 97.7 F (36.5 C)   Ht 5' (1.524 m)   Wt 187 lb (84.8 kg)   SpO2 94%   BMI 36.52 kg/m   BP Readings from Last 3 Encounters:  02/01/24 128/60  01/28/24 136/78  12/22/23 117/69    Wt Readings from Last 3 Encounters:  02/01/24 187 lb (84.8 kg)  12/14/23 189 lb (85.7 kg)  11/03/23 186 lb 12.8 oz (84.7 kg)    Physical Exam Constitutional:      General: She is not in acute distress.    Appearance: She  is well-developed. She is obese. She is not toxic-appearing or diaphoretic.  HENT:     Head: Normocephalic.     Right Ear: External ear normal.     Left Ear: External ear normal.     Nose: Nose normal.  Eyes:     General:        Right eye: No discharge.        Left eye: No discharge.     Conjunctiva/sclera: Conjunctivae normal.     Pupils: Pupils are equal, round, and reactive to light.  Neck:     Thyroid : No thyromegaly.     Vascular: No JVD.     Trachea: No tracheal deviation.  Cardiovascular:     Rate and  Rhythm: Normal rate and regular rhythm.     Heart sounds: Normal heart sounds.     No gallop.  Pulmonary:     Effort: No respiratory distress.     Breath sounds: No stridor. No wheezing.  Abdominal:     General: Bowel sounds are normal. There is no distension.     Palpations: Abdomen is soft. There is no mass.     Tenderness: There is no abdominal tenderness. There is no guarding or rebound.  Musculoskeletal:        General: Tenderness present.     Cervical back: Normal range of motion and neck supple. No rigidity.     Right lower leg: Edema present.     Left lower leg: Edema present.  Lymphadenopathy:     Cervical: No cervical adenopathy.  Skin:    Findings: No erythema or rash.  Neurological:     Mental Status: She is oriented to person, place, and time.     Cranial Nerves: No cranial nerve deficit.     Motor: Weakness present. No abnormal muscle tone.     Coordination: Coordination normal.     Gait: Gait abnormal.     Deep Tendon Reflexes: Reflexes normal.  Psychiatric:        Behavior: Behavior normal.        Thought Content: Thought content normal.        Judgment: Judgment normal.   2+ B leg edema Decr BS B at lung bases LS spine w/pain on range of motion Using a walker to walk    A total time of 45 minutes was spent preparing to see the patient, reviewing tests, x-rays, operative reports and other medical records.  Also, obtaining history and  performing comprehensive physical exam.  Additionally, counseling the patient regarding the above listed issues -severe pain, fluid retention, possible PE, possible CHF/fluid overload.   Finally, documenting clinical information in the health records, coordination of care, educating the patient to use Demadex . It is a complex case.   Lab Results  Component Value Date   WBC 5.8 01/28/2024   HGB 10.4 (L) 01/28/2024   HCT 30.9 (L) 01/28/2024   PLT 416.0 (H) 01/28/2024   GLUCOSE 105 (H) 01/28/2024   CHOL 199 06/24/2022   TRIG 144.0 06/24/2022   HDL 55.00 06/24/2022   LDLCALC 115 (H) 06/24/2022   ALT 12 01/28/2024   AST 19 01/28/2024   NA 132 (L) 01/28/2024   K 4.0 01/28/2024   CL 91 (L) 01/28/2024   CREATININE 1.02 01/28/2024   BUN 17 01/28/2024   CO2 30 01/28/2024   TSH 0.83 06/24/2022   INR 1.0 04/10/2021    US  Abdomen Complete Result Date: 03/19/2023 CLINICAL DATA:  Right abdomen pain and right flank pain. EXAM: ABDOMEN ULTRASOUND COMPLETE COMPARISON:  April 07, 2016 FINDINGS: Gallbladder: No gallstones or wall thickening visualized. No sonographic Murphy sign noted by sonographer. Common bile duct: Diameter: 4 mm Liver: Simple cysts identified within the liver, largest measures 2 x 1.8 x 1.6 cm. Within normal limits in parenchymal echogenicity. Portal vein is patent on color Doppler imaging with normal direction of blood flow towards the liver. IVC: No abnormality visualized. Pancreas: Visualized portion unremarkable. Spleen: Size and appearance within normal limits. Right Kidney: Length: 9.6 cm. Echogenicity within normal limits. No mass or hydronephrosis visualized. Left Kidney: Length: 10 cm. Echogenicity within normal limits. No mass or hydronephrosis visualized. Abdominal aorta: No aneurysm visualized. Other findings: None. IMPRESSION: No acute abnormality identified. Electronically Signed  By: Craig Farr M.D.   On: 03/19/2023 10:14    Assessment & Plan:   Problem List  Items Addressed This Visit     Acute bilateral thoracic back pain   Episodic thoracic back pain, extremely severe Chest CT angio pending tomorrow to rule out PE Thoracic spine MRI pending on February 11, 2024      Relevant Medications   HYDROcodone -acetaminophen  (NORCO) 10-325 MG tablet   Anemia   Pat required 2 units of blood transfused after her spinal fusion surgery      RESOLVED: Chronic cough   Resolved      DOE (dyspnea on exertion) - Primary   Fluid retention, probable CHF She is 3 wks post-op lumbar spine fusion Start Demadex  20-40 mg/d. Hydrate well for CT scan tomorrow Discontinue furosemide .      Hypoxia   Treat ?CHF Demadex  20-40 po per day Continue to check pulse ox at home -lately O2 sat is 93-95% Rule out PE Chest CT angiogram pending tomorrow      Lumbar back pain   Uncontrolled pain.  On Norco for LBP 5/325 2 tab q4h prn, Tramadol  (LBP6/10 w/meds, 10/10 w/o meds) On Gabapentin  900 mg/d We should reduce Tylenol  intake Changed to Norco 10/325 mg from Norco 5/325 mg given every 4 hours Check pulse ox at home - 93-95% Adjust bowel program       Relevant Medications   HYDROcodone -acetaminophen  (NORCO) 10-325 MG tablet      Meds ordered this encounter  Medications   torsemide  (DEMADEX ) 20 MG tablet    Sig: Take 1-2 tablets (20-40 mg total) by mouth daily.    Dispense:  60 tablet    Refill:  3   HYDROcodone -acetaminophen  (NORCO) 10-325 MG tablet    Sig: Take 1 tablet by mouth every 4 (four) hours as needed for severe pain (pain score 7-10).    Dispense:  120 tablet    Refill:  0      Follow-up: Return in about 10 days (around 02/11/2024) for a follow-up visit.  Marolyn Noel, MD

## 2024-02-01 NOTE — Assessment & Plan Note (Signed)
 Pat required 2 units of blood transfused after her spinal fusion surgery

## 2024-02-01 NOTE — Assessment & Plan Note (Signed)
 Episodic thoracic back pain, extremely severe Chest CT angio pending tomorrow to rule out PE Thoracic spine MRI pending on February 11, 2024

## 2024-02-01 NOTE — Telephone Encounter (Signed)
 Copied from CRM [id number]. Topic: Clinical - Medication Question >> Feb 01, 2024 12:42 PM Drema MATSU wrote: Reason for CRM: Mariah Farrell was calling to inform that Hydrocodone  with and Gabapentin  and Tramadol   She is also concerned with the high dose Hydrocodone . She is needing the ok to fill. Please call pharmacy back. >> Feb 01, 2024 12:47 PM Drema MATSU wrote: **Hydrocodone  has a drug reaction to with and Gabapentin  and Tramadol 

## 2024-02-01 NOTE — Assessment & Plan Note (Addendum)
 Treat ?CHF Demadex  20-40 po per day Continue to check pulse ox at home -lately O2 sat is 93-95% Rule out PE Chest CT angiogram pending tomorrow

## 2024-02-01 NOTE — Assessment & Plan Note (Addendum)
 Uncontrolled pain.  On Norco for LBP 5/325 2 tab q4h prn, Tramadol  (LBP6/10 w/meds, 10/10 w/o meds) On Gabapentin  900 mg/d We should reduce Tylenol  intake Changed to Norco 10/325 mg from Norco 5/325 mg given every 4 hours Check pulse ox at home - 93-95% Adjust bowel program

## 2024-02-02 ENCOUNTER — Ambulatory Visit (HOSPITAL_COMMUNITY)
Admission: RE | Admit: 2024-02-02 | Discharge: 2024-02-02 | Disposition: A | Discharge status: Home / Self Care | Source: Ambulatory Visit | Attending: Internal Medicine | Admitting: Internal Medicine

## 2024-02-02 ENCOUNTER — Other Ambulatory Visit (HOSPITAL_COMMUNITY): Payer: Self-pay

## 2024-02-02 ENCOUNTER — Encounter (HOSPITAL_COMMUNITY): Payer: Self-pay

## 2024-02-02 DIAGNOSIS — R7989 Other specified abnormal findings of blood chemistry: Secondary | ICD-10-CM

## 2024-02-02 DIAGNOSIS — R0902 Hypoxemia: Secondary | ICD-10-CM

## 2024-02-04 MED ORDER — NALOXONE HCL 4 MG/0.1ML NA LIQD: NASAL | 2 refills | Status: AC

## 2024-02-04 NOTE — Addendum Note (Signed)
 Addended by: Ralphine Hinks V on: 02/04/2024 12:51 PM   Modules accepted: Orders

## 2024-02-04 NOTE — Telephone Encounter (Signed)
 Attempted to speak with pharmacy, call was disconnected. Will attempt again

## 2024-02-04 NOTE — Telephone Encounter (Signed)
 Mariah Farrell is not taking tramadol  with hydrocodone .  Tramadol  is on hold.  Unfortunately she requires higher dose of hydrocodone  to manage her pain at present, lower dose is not working.  I will send a prescription for Narcan  for her to have on hand.  Thanks

## 2024-02-07 ENCOUNTER — Other Ambulatory Visit (HOSPITAL_COMMUNITY): Payer: Self-pay

## 2024-02-07 DIAGNOSIS — M4316 Spondylolisthesis, lumbar region: Secondary | ICD-10-CM | POA: Diagnosis not present

## 2024-02-07 DIAGNOSIS — Z981 Arthrodesis status: Secondary | ICD-10-CM | POA: Diagnosis not present

## 2024-02-07 DIAGNOSIS — M4310 Spondylolisthesis, site unspecified: Secondary | ICD-10-CM | POA: Diagnosis not present

## 2024-02-07 DIAGNOSIS — T8130XA Disruption of wound, unspecified, initial encounter: Secondary | ICD-10-CM | POA: Diagnosis not present

## 2024-02-09 ENCOUNTER — Other Ambulatory Visit: Payer: Self-pay | Admitting: Internal Medicine

## 2024-02-09 DIAGNOSIS — M454 Ankylosing spondylitis of thoracic region: Secondary | ICD-10-CM

## 2024-02-11 ENCOUNTER — Other Ambulatory Visit

## 2024-02-11 DIAGNOSIS — Z79890 Hormone replacement therapy (postmenopausal): Secondary | ICD-10-CM | POA: Diagnosis not present

## 2024-02-11 DIAGNOSIS — Z7982 Long term (current) use of aspirin: Secondary | ICD-10-CM | POA: Diagnosis not present

## 2024-02-11 DIAGNOSIS — Z79899 Other long term (current) drug therapy: Secondary | ICD-10-CM | POA: Diagnosis not present

## 2024-02-11 DIAGNOSIS — T8131XA Disruption of external operation (surgical) wound, not elsewhere classified, initial encounter: Secondary | ICD-10-CM | POA: Diagnosis not present

## 2024-02-11 DIAGNOSIS — B957 Other staphylococcus as the cause of diseases classified elsewhere: Secondary | ICD-10-CM | POA: Diagnosis not present

## 2024-02-11 DIAGNOSIS — X58XXXA Exposure to other specified factors, initial encounter: Secondary | ICD-10-CM | POA: Diagnosis not present

## 2024-02-11 DIAGNOSIS — Z8551 Personal history of malignant neoplasm of bladder: Secondary | ICD-10-CM | POA: Diagnosis not present

## 2024-02-11 DIAGNOSIS — Z981 Arthrodesis status: Secondary | ICD-10-CM | POA: Diagnosis not present

## 2024-02-11 DIAGNOSIS — E039 Hypothyroidism, unspecified: Secondary | ICD-10-CM | POA: Diagnosis not present

## 2024-02-11 DIAGNOSIS — K219 Gastro-esophageal reflux disease without esophagitis: Secondary | ICD-10-CM | POA: Diagnosis not present

## 2024-02-11 DIAGNOSIS — I10 Essential (primary) hypertension: Secondary | ICD-10-CM | POA: Diagnosis not present

## 2024-02-14 ENCOUNTER — Encounter: Payer: Self-pay | Admitting: Internal Medicine

## 2024-02-14 ENCOUNTER — Ambulatory Visit (INDEPENDENT_AMBULATORY_CARE_PROVIDER_SITE_OTHER): Admitting: Internal Medicine

## 2024-02-14 VITALS — BP 90/60 | HR 75 | Temp 98.0°F | Ht 60.0 in | Wt 181.4 lb

## 2024-02-14 DIAGNOSIS — H6120 Impacted cerumen, unspecified ear: Secondary | ICD-10-CM | POA: Insufficient documentation

## 2024-02-14 DIAGNOSIS — M546 Pain in thoracic spine: Secondary | ICD-10-CM | POA: Diagnosis not present

## 2024-02-14 DIAGNOSIS — M5442 Lumbago with sciatica, left side: Secondary | ICD-10-CM | POA: Diagnosis not present

## 2024-02-14 DIAGNOSIS — I10 Essential (primary) hypertension: Secondary | ICD-10-CM | POA: Diagnosis not present

## 2024-02-14 DIAGNOSIS — H6121 Impacted cerumen, right ear: Secondary | ICD-10-CM

## 2024-02-14 DIAGNOSIS — R0609 Other forms of dyspnea: Secondary | ICD-10-CM

## 2024-02-14 DIAGNOSIS — T8149XA Infection following a procedure, other surgical site, initial encounter: Secondary | ICD-10-CM | POA: Insufficient documentation

## 2024-02-14 LAB — COMPREHENSIVE METABOLIC PANEL WITH GFR
ALT: 5 U/L (ref 0–35)
AST: 16 U/L (ref 0–37)
Albumin: 3.7 g/dL (ref 3.5–5.2)
Alkaline Phosphatase: 90 U/L (ref 39–117)
BUN: 26 mg/dL — ABNORMAL HIGH (ref 6–23)
CO2: 35 meq/L — ABNORMAL HIGH (ref 19–32)
Calcium: 9.6 mg/dL (ref 8.4–10.5)
Chloride: 97 meq/L (ref 96–112)
Creatinine, Ser: 1.33 mg/dL — ABNORMAL HIGH (ref 0.40–1.20)
GFR: 36.56 mL/min — ABNORMAL LOW (ref >60.00)
Glucose, Bld: 107 mg/dL — ABNORMAL HIGH (ref 70–99)
Potassium: 3.6 meq/L (ref 3.5–5.1)
Sodium: 140 meq/L (ref 135–145)
Total Bilirubin: 0.5 mg/dL (ref 0.2–1.2)
Total Protein: 6.4 g/dL (ref 6.0–8.3)

## 2024-02-14 LAB — HEMOGLOBIN A1C: Hgb A1c MFr Bld: 5.5 % (ref 4.6–6.5)

## 2024-02-14 MED ORDER — CELECOXIB 200 MG PO CAPS: 200.0000 mg | ORAL_CAPSULE | Freq: Every day | ORAL | 1 refills | Status: AC | PRN

## 2024-02-14 NOTE — Progress Notes (Signed)
 Subjective:  Patient ID: Mariah Farrell, female    DOB: 1941-03-28  Age: 83 y.o. MRN: [id number]  CC: Follow-up (Patient has a few things to discuss. )   HPI Mariah Farrell presents for edema - much better C/o pain - wound opened up - on vacuum device Pain got better; worse now after wound opened up: back on pain meds  Outpatient Medications Prior to Visit  Medication Sig Dispense Refill   Biotin  5000 MCG TABS Take 1 tablet by mouth daily.     calcitonin, salmon, (MIACALCIN/FORTICAL) 200 UNIT/ACT nasal spray USE ONE SPRAY IN ONE NOSTRIL DAILY. ALTERNATE NOSTRILS 11.1 mL 11   Calcium Carb-Cholecalciferol (OYSTER SHELL CALCIUM W/D) 500-5 MG-MCG TABS Take 1 tablet by mouth daily.     cetirizine (ZYRTEC) 10 MG tablet Take 10 mg by mouth daily.     Cholecalciferol (VITAMIN D3) 2000 units capsule Take 1 capsule (2,000 Units total) by mouth daily. 100 capsule 3   diazepam  (VALIUM ) 5 MG tablet TAKE 1/2-1 TABLET BY MOUTH EVERY 12 HOURS AS NEEDED FOR ANXIETY, INSOMNIA 180 tablet 0   DULoxetine  (CYMBALTA ) 60 MG capsule Take 1 capsule (60 mg total) by mouth daily. 90 capsule 3   gabapentin  (NEURONTIN ) 300 MG capsule Take 1 capsule (300 mg total) by mouth at bedtime. 90 capsule 3   glucosamine-chondroitin 500-400 MG tablet Take 1 tablet by mouth daily.     HYDROcodone -acetaminophen  (NORCO) 10-325 MG tablet Take 1 tablet by mouth every 4 (four) hours as needed for severe pain (pain score 7-10). 120 tablet 0   incobotulinumtoxinA  (XEOMIN ) 100 units SOLR injection Inject 400 Units into the muscle every 3 (three) months. Inject 400 units into the muscle every 90 days. 4 each 0   latanoprost (XALATAN) 0.005 % ophthalmic solution SMARTSIG:In Eye(s)     levothyroxine  (SYNTHROID ) 25 MCG tablet TAKE 1 TABLET BY MOUTH DAILY BEFORE BREAKFAST 90 tablet 3   Multiple Vitamin (MULTIVITAMIN) tablet Take 1 tablet by mouth daily.     pantoprazole  (PROTONIX ) 40 MG tablet Take 1 tablet (40 mg total) by  mouth 2 (two) times daily. 180 tablet 3   timolol (TIMOPTIC) 0.5 % ophthalmic solution Place 1 drop into both eyes daily.     torsemide  (DEMADEX ) 20 MG tablet Take 1-2 tablets (20-40 mg total) by mouth daily. 60 tablet 3   traMADol  (ULTRAM ) 50 MG tablet Take 1 tablet (50 mg total) by mouth every 6 (six) hours as needed. 120 tablet 1   vitamin C (ASCORBIC ACID) 500 MG tablet Take 1,000 mg by mouth daily.      albuterol  (VENTOLIN  HFA) 108 (90 Base) MCG/ACT inhaler Inhale 2 puffs into the lungs every 4 (four) hours as needed for wheezing or shortness of breath. (Patient not taking: Reported on 02/14/2024) 8 g 0   aspirin -acetaminophen -caffeine (EXCEDRIN MIGRAINE) 250-250-65 MG tablet Take 1 tablet by mouth.     benzonatate  (TESSALON ) 200 MG capsule Take 1 capsule (200 mg total) by mouth 3 (three) times daily as needed for cough. 90 capsule 1   calcium-vitamin D  (OSCAL WITH D) 500-200 MG-UNIT per tablet Take 1 tablet by mouth daily.     Diclofenac  Sodium (VOLTAREN EX) Apply topically as needed.     diphenhydrAMINE -PE-APAP 12.5-5-325 MG TABS Take 1 tablet by mouth 4 (four) times daily as needed. 56 tablet 1   estradiol (ESTRACE) 0.1 MG/GM vaginal cream Place 1 Applicatorful vaginally 2 (two) times a week.     gabapentin  (NEURONTIN ) 100 MG capsule Take  1 capsule (100 mg total) by mouth 3 (three) times daily as needed. 90 capsule 3   HYDROmorphone  (DILAUDID ) 2 MG tablet Take 1 tablet (2 mg total) by mouth every 6 (six) hours as needed for severe pain (pain score 7-10). 14 tablet 0   ipratropium (ATROVENT ) 0.03 % nasal spray Place 1 spray into both nostrils 4 (four) times daily. 30 mL 11   methylPREDNISolone  (MEDROL  DOSEPAK) 4 MG TBPK tablet As directed 21 tablet 0   naloxone  (NARCAN ) nasal spray 4 mg/0.1 mL 1 actuation in one nostril once. May repeat in 2-3 min 1 each 2   promethazine -dextromethorphan (PROMETHAZINE -DM) 6.25-15 MG/5ML syrup Take 5 mLs by mouth 4 (four) times daily as needed for cough  (Alternate every 4-6 hours with hydrocodone  cough syrup). 240 mL 0   Facility-Administered Medications Prior to Visit  Medication Dose Route Frequency Provider Last Rate Last Admin   incobotulinumtoxinA  (XEOMIN ) 100 units injection 100 Units  100 Units Intramuscular Q90 days Onita Duos, MD   100 Units at 06/23/23 1340   incobotulinumtoxinA  (XEOMIN ) 100 units injection 100 Units  100 Units Intramuscular Once Onita Duos, MD       incobotulinumtoxinA  (XEOMIN ) 100 units injection 100 Units  100 Units Intramuscular Q90 days Onita Duos, MD        ROS: Review of Systems  Constitutional:  Positive for fatigue. Negative for activity change, appetite change, chills and unexpected weight change.  HENT:  Negative for congestion, mouth sores and sinus pressure.   Eyes:  Negative for visual disturbance.  Respiratory:  Negative for cough and chest tightness.   Gastrointestinal:  Negative for abdominal pain and nausea.  Genitourinary:  Negative for difficulty urinating, frequency and vaginal pain.  Musculoskeletal:  Positive for back pain, gait problem, neck pain and neck stiffness.  Skin:  Negative for pallor and rash.  Neurological:  Negative for dizziness, tremors, weakness, numbness and headaches.  Psychiatric/Behavioral:  Negative for confusion, sleep disturbance and suicidal ideas.     Objective:  BP 90/60   Pulse 75   Temp 98 F (36.7 C) (Oral)   Ht 5' (1.524 m)   Wt 181 lb 6.4 oz (82.3 kg)   SpO2 94%   BMI 35.43 kg/m   BP Readings from Last 3 Encounters:  02/14/24 90/60  02/01/24 128/60  01/28/24 136/78    Wt Readings from Last 3 Encounters:  02/14/24 181 lb 6.4 oz (82.3 kg)  02/01/24 187 lb (84.8 kg)  12/14/23 189 lb (85.7 kg)    Physical Exam Constitutional:      General: She is not in acute distress.    Appearance: She is well-developed.  HENT:     Head: Normocephalic.     Right Ear: External ear normal.     Left Ear: External ear normal.     Nose: Nose normal.   Eyes:     General:        Right eye: No discharge.        Left eye: No discharge.     Conjunctiva/sclera: Conjunctivae normal.     Pupils: Pupils are equal, round, and reactive to light.  Neck:     Thyroid : No thyromegaly.     Vascular: No JVD.     Trachea: No tracheal deviation.  Cardiovascular:     Rate and Rhythm: Normal rate and regular rhythm.     Heart sounds: Normal heart sounds.  Pulmonary:     Effort: No respiratory distress.     Breath sounds: No  stridor. No wheezing.  Abdominal:     General: Bowel sounds are normal. There is no distension.     Palpations: Abdomen is soft. There is no mass.     Tenderness: There is no abdominal tenderness. There is no guarding or rebound.  Musculoskeletal:        General: No tenderness.     Cervical back: Normal range of motion and neck supple. No rigidity.     Right lower leg: Edema present.     Left lower leg: Edema present.  Lymphadenopathy:     Cervical: No cervical adenopathy.  Skin:    Findings: No erythema or rash.  Neurological:     Mental Status: She is oriented to person, place, and time.     Cranial Nerves: No cranial nerve deficit.     Motor: No abnormal muscle tone.     Coordination: Coordination normal.     Gait: Gait abnormal.     Deep Tendon Reflexes: Reflexes normal.  Psychiatric:        Behavior: Behavior normal.        Thought Content: Thought content normal.        Judgment: Judgment normal.   Legs w/trace edema Vacuum device is attached Wax R ear   Lab Results  Component Value Date   WBC 5.8 01/28/2024   HGB 10.4 (L) 01/28/2024   HCT 30.9 (L) 01/28/2024   PLT 416.0 (H) 01/28/2024   GLUCOSE 105 (H) 01/28/2024   CHOL 199 06/24/2022   TRIG 144.0 06/24/2022   HDL 55.00 06/24/2022   LDLCALC 115 (H) 06/24/2022   ALT 12 01/28/2024   AST 19 01/28/2024   NA 132 (L) 01/28/2024   K 4.0 01/28/2024   CL 91 (L) 01/28/2024   CREATININE 1.02 01/28/2024   BUN 17 01/28/2024   CO2 30 01/28/2024   TSH 0.83  06/24/2022   INR 1.0 04/10/2021    No results found.  Assessment & Plan:   Problem List Items Addressed This Visit     Cerumen impaction (Chronic)   R ear wax Will irrigate leter      DOE (dyspnea on exertion)   Much better  On Torsemide  - reduce to 20 mg/d      Relevant Orders   Comprehensive metabolic panel with GFR   Hemoglobin A1c   Essential hypertension   Monitor BP BP Readings from Last 3 Encounters:  02/14/24 (!) 88/60  02/01/24 128/60  01/28/24 136/78         Relevant Orders   Comprehensive metabolic panel with GFR   Hemoglobin A1c   Thoracic spine pain - Primary   Better Norco prn Thor spine MRI is being re-scheduled Celebrex  prrn w/caution      Relevant Medications   celecoxib  (CELEBREX ) 200 MG capsule   Other Visit Diagnoses       Acute bilateral low back pain with left-sided sciatica       Relevant Medications   celecoxib  (CELEBREX ) 200 MG capsule         Meds ordered this encounter  Medications   celecoxib  (CELEBREX ) 200 MG capsule    Sig: Take 1 capsule (200 mg total) by mouth daily as needed.    Dispense:  30 capsule    Refill:  1      Follow-up: Return in about 4 months (around 06/15/2024) for a follow-up visit.  Marolyn Noel, MD

## 2024-02-14 NOTE — Assessment & Plan Note (Signed)
 Monitor BP BP Readings from Last 3 Encounters:  02/14/24 (!) 88/60  02/01/24 128/60  01/28/24 136/78

## 2024-02-14 NOTE — Assessment & Plan Note (Signed)
 Much better  On Torsemide  - reduce to 20 mg/d

## 2024-02-14 NOTE — Assessment & Plan Note (Signed)
 Better Norco prn Thor spine MRI is being re-scheduled Celebrex  prrn w/caution

## 2024-02-14 NOTE — Assessment & Plan Note (Signed)
 R ear wax Will irrigate leter

## 2024-02-15 ENCOUNTER — Ambulatory Visit: Payer: Self-pay | Admitting: Internal Medicine

## 2024-02-16 DIAGNOSIS — Z1231 Encounter for screening mammogram for malignant neoplasm of breast: Secondary | ICD-10-CM | POA: Diagnosis not present

## 2024-02-16 DIAGNOSIS — Z981 Arthrodesis status: Secondary | ICD-10-CM | POA: Diagnosis not present

## 2024-02-16 LAB — HM MAMMOGRAPHY

## 2024-02-22 ENCOUNTER — Encounter: Payer: Self-pay | Admitting: Internal Medicine

## 2024-03-04 ENCOUNTER — Other Ambulatory Visit

## 2024-03-06 DIAGNOSIS — M48062 Spinal stenosis, lumbar region with neurogenic claudication: Secondary | ICD-10-CM | POA: Diagnosis not present

## 2024-03-07 ENCOUNTER — Ambulatory Visit
Admission: RE | Admit: 2024-03-07 | Discharge: 2024-03-07 | Disposition: A | Discharge status: Home / Self Care | Source: Ambulatory Visit | Attending: Internal Medicine | Admitting: Internal Medicine

## 2024-03-07 ENCOUNTER — Ambulatory Visit: Payer: Self-pay | Admitting: Internal Medicine

## 2024-03-07 ENCOUNTER — Other Ambulatory Visit: Payer: Self-pay | Admitting: Internal Medicine

## 2024-03-07 DIAGNOSIS — E049 Nontoxic goiter, unspecified: Secondary | ICD-10-CM

## 2024-03-07 DIAGNOSIS — R071 Chest pain on breathing: Secondary | ICD-10-CM

## 2024-03-07 DIAGNOSIS — M546 Pain in thoracic spine: Secondary | ICD-10-CM

## 2024-03-07 DIAGNOSIS — R0789 Other chest pain: Secondary | ICD-10-CM

## 2024-03-07 DIAGNOSIS — M4804 Spinal stenosis, thoracic region: Secondary | ICD-10-CM | POA: Diagnosis not present

## 2024-03-07 DIAGNOSIS — R053 Chronic cough: Secondary | ICD-10-CM

## 2024-03-07 DIAGNOSIS — M545 Low back pain, unspecified: Secondary | ICD-10-CM

## 2024-03-07 DIAGNOSIS — M4314 Spondylolisthesis, thoracic region: Secondary | ICD-10-CM | POA: Diagnosis not present

## 2024-03-07 DIAGNOSIS — M47814 Spondylosis without myelopathy or radiculopathy, thoracic region: Secondary | ICD-10-CM | POA: Diagnosis not present

## 2024-03-13 ENCOUNTER — Telehealth: Payer: Self-pay

## 2024-03-13 ENCOUNTER — Encounter: Payer: Self-pay | Admitting: Internal Medicine

## 2024-03-13 ENCOUNTER — Ambulatory Visit (INDEPENDENT_AMBULATORY_CARE_PROVIDER_SITE_OTHER): Admitting: Internal Medicine

## 2024-03-13 VITALS — BP 122/70 | HR 77 | Temp 98.0°F | Ht 60.0 in | Wt 180.6 lb

## 2024-03-13 DIAGNOSIS — M546 Pain in thoracic spine: Secondary | ICD-10-CM

## 2024-03-13 DIAGNOSIS — I10 Essential (primary) hypertension: Secondary | ICD-10-CM | POA: Diagnosis not present

## 2024-03-13 DIAGNOSIS — Z23 Encounter for immunization: Secondary | ICD-10-CM | POA: Diagnosis not present

## 2024-03-13 DIAGNOSIS — R600 Localized edema: Secondary | ICD-10-CM

## 2024-03-13 DIAGNOSIS — E039 Hypothyroidism, unspecified: Secondary | ICD-10-CM | POA: Diagnosis not present

## 2024-03-13 DIAGNOSIS — L578 Other skin changes due to chronic exposure to nonionizing radiation: Secondary | ICD-10-CM | POA: Diagnosis not present

## 2024-03-13 DIAGNOSIS — M545 Low back pain, unspecified: Secondary | ICD-10-CM

## 2024-03-13 DIAGNOSIS — L570 Actinic keratosis: Secondary | ICD-10-CM | POA: Diagnosis not present

## 2024-03-13 DIAGNOSIS — R0609 Other forms of dyspnea: Secondary | ICD-10-CM | POA: Diagnosis not present

## 2024-03-13 DIAGNOSIS — N289 Disorder of kidney and ureter, unspecified: Secondary | ICD-10-CM

## 2024-03-13 DIAGNOSIS — R053 Chronic cough: Secondary | ICD-10-CM

## 2024-03-13 DIAGNOSIS — L820 Inflamed seborrheic keratosis: Secondary | ICD-10-CM | POA: Diagnosis not present

## 2024-03-13 DIAGNOSIS — L720 Epidermal cyst: Secondary | ICD-10-CM | POA: Diagnosis not present

## 2024-03-13 DIAGNOSIS — Z85828 Personal history of other malignant neoplasm of skin: Secondary | ICD-10-CM | POA: Diagnosis not present

## 2024-03-13 LAB — BASIC METABOLIC PANEL WITH GFR
BUN: 20 mg/dL (ref 6–23)
CO2: 28 meq/L (ref 19–32)
Calcium: 9.1 mg/dL (ref 8.4–10.5)
Chloride: 106 meq/L (ref 96–112)
Creatinine, Ser: 1.12 mg/dL (ref 0.40–1.20)
GFR: 44.91 mL/min — ABNORMAL LOW (ref >60.00)
Glucose, Bld: 108 mg/dL — ABNORMAL HIGH (ref 70–99)
Potassium: 3.9 meq/L (ref 3.5–5.1)
Sodium: 141 meq/L (ref 135–145)

## 2024-03-13 MED ORDER — DIAZEPAM 5 MG PO TABS: ORAL_TABLET | ORAL | 0 refills | Status: AC

## 2024-03-13 MED ORDER — METHYLPREDNISOLONE 4 MG PO TBPK: ORAL_TABLET | ORAL | 0 refills | Status: AC

## 2024-03-13 NOTE — Assessment & Plan Note (Signed)
 Resolved on a diuretic Use prn

## 2024-03-13 NOTE — Assessment & Plan Note (Signed)
 Monitor BP BP Readings from Last 3 Encounters:  03/13/24 122/70  02/14/24 90/60  02/01/24 128/60

## 2024-03-13 NOTE — Assessment & Plan Note (Addendum)
 Better Thor spine MRI w/OA and spinal stenosis - discussed Celebrex  prrn w/caution or Medrol  pack (helped in the past)

## 2024-03-13 NOTE — Assessment & Plan Note (Signed)
 Uncontrolled pain.  On Norco for LBP 5/325 2 tab q4h prn, Tramadol  (LBP6/10 w/meds, 10/10 w/o meds) On Gabapentin  900 mg/d We should reduce Tylenol  intake Changed to Norco 10/325 mg from Norco 5/325 mg given every 4 hours Check pulse ox at home - 93-95% Adjust bowel program

## 2024-03-13 NOTE — Progress Notes (Signed)
 Subjective:  Patient ID: Mariah Farrell, female    DOB: [birth year]  Age: 83 y.o. MRN: [id number]  CC: Medical Management of Chronic Issues (Back pain, wound follow up. Results on lab work and MRI of spine)   HPI Mariah Farrell presents for LBP, thoracic pain w/abn MRI (steroids help), edema  Outpatient Medications Prior to Visit  Medication Sig Dispense Refill   aspirin -acetaminophen -caffeine (EXCEDRIN MIGRAINE) 250-250-65 MG tablet Take 1 tablet by mouth.     benzonatate  (TESSALON ) 200 MG capsule Take 1 capsule (200 mg total) by mouth 3 (three) times daily as needed for cough. 90 capsule 1   Biotin  5000 MCG TABS Take 1 tablet by mouth daily.     calcitonin, salmon, (MIACALCIN/FORTICAL) 200 UNIT/ACT nasal spray USE ONE SPRAY IN ONE NOSTRIL DAILY. ALTERNATE NOSTRILS 11.1 mL 11   Calcium Carb-Cholecalciferol (OYSTER SHELL CALCIUM W/D) 500-5 MG-MCG TABS Take 1 tablet by mouth daily.     calcium-vitamin D  (OSCAL WITH D) 500-200 MG-UNIT per tablet Take 1 tablet by mouth daily.     celecoxib  (CELEBREX ) 200 MG capsule Take 1 capsule (200 mg total) by mouth daily as needed. 30 capsule 1   cetirizine (ZYRTEC) 10 MG tablet Take 10 mg by mouth daily.     Cholecalciferol (VITAMIN D3) 2000 units capsule Take 1 capsule (2,000 Units total) by mouth daily. 100 capsule 3   diphenhydrAMINE -PE-APAP 12.5-5-325 MG TABS Take 1 tablet by mouth 4 (four) times daily as needed. 56 tablet 1   DULoxetine  (CYMBALTA ) 60 MG capsule Take 1 capsule (60 mg total) by mouth daily. 90 capsule 3   estradiol (ESTRACE) 0.1 MG/GM vaginal cream Place 1 Applicatorful vaginally 2 (two) times a week.     gabapentin  (NEURONTIN ) 100 MG capsule Take 1 capsule (100 mg total) by mouth 3 (three) times daily as needed. 90 capsule 3   gabapentin  (NEURONTIN ) 300 MG capsule Take 1 capsule (300 mg total) by mouth at bedtime. 90 capsule 3   glucosamine-chondroitin 500-400 MG tablet Take 1 tablet by mouth daily.      HYDROcodone -acetaminophen  (NORCO) 10-325 MG tablet Take 1 tablet by mouth every 4 (four) hours as needed for severe pain (pain score 7-10). 120 tablet 0   HYDROmorphone  (DILAUDID ) 2 MG tablet Take 1 tablet (2 mg total) by mouth every 6 (six) hours as needed for severe pain (pain score 7-10). 14 tablet 0   incobotulinumtoxinA  (XEOMIN ) 100 units SOLR injection Inject 400 Units into the muscle every 3 (three) months. Inject 400 units into the muscle every 90 days. 4 each 0   latanoprost (XALATAN) 0.005 % ophthalmic solution SMARTSIG:In Eye(s)     levothyroxine  (SYNTHROID ) 25 MCG tablet TAKE 1 TABLET BY MOUTH DAILY BEFORE BREAKFAST 90 tablet 3   Multiple Vitamin (MULTIVITAMIN) tablet Take 1 tablet by mouth daily.     pantoprazole  (PROTONIX ) 40 MG tablet Take 1 tablet (40 mg total) by mouth 2 (two) times daily. 180 tablet 3   promethazine -dextromethorphan (PROMETHAZINE -DM) 6.25-15 MG/5ML syrup Take 5 mLs by mouth 4 (four) times daily as needed for cough (Alternate every 4-6 hours with hydrocodone  cough syrup). 240 mL 0   timolol (TIMOPTIC) 0.5 % ophthalmic solution Place 1 drop into both eyes daily.     torsemide  (DEMADEX ) 20 MG tablet Take 1-2 tablets (20-40 mg total) by mouth daily. 60 tablet 3   traMADol  (ULTRAM ) 50 MG tablet Take 1 tablet (50 mg total) by mouth every 6 (six) hours as needed. 120 tablet 1  vitamin C (ASCORBIC ACID) 500 MG tablet Take 1,000 mg by mouth daily.      diazepam  (VALIUM ) 5 MG tablet TAKE 1/2-1 TABLET BY MOUTH EVERY 12 HOURS AS NEEDED FOR ANXIETY, INSOMNIA 180 tablet 0   methylPREDNISolone  (MEDROL  DOSEPAK) 4 MG TBPK tablet As directed 21 tablet 0   albuterol  (VENTOLIN  HFA) 108 (90 Base) MCG/ACT inhaler Inhale 2 puffs into the lungs every 4 (four) hours as needed for wheezing or shortness of breath. (Patient not taking: Reported on 03/13/2024) 8 g 0   Diclofenac  Sodium (VOLTAREN EX) Apply topically as needed.     ipratropium (ATROVENT ) 0.03 % nasal spray Place 1 spray into  both nostrils 4 (four) times daily. 30 mL 11   naloxone  (NARCAN ) nasal spray 4 mg/0.1 mL 1 actuation in one nostril once. May repeat in 2-3 min 1 each 2   Facility-Administered Medications Prior to Visit  Medication Dose Route Frequency Provider Last Rate Last Admin   incobotulinumtoxinA  (XEOMIN ) 100 units injection 100 Units  100 Units Intramuscular Q90 days Onita Duos, MD   100 Units at 06/23/23 1340   incobotulinumtoxinA  (XEOMIN ) 100 units injection 100 Units  100 Units Intramuscular Once Onita Duos, MD       incobotulinumtoxinA  (XEOMIN ) 100 units injection 100 Units  100 Units Intramuscular Q90 days Onita Duos, MD        ROS: Review of Systems  Constitutional:  Negative for activity change, appetite change, chills, fatigue and unexpected weight change.  HENT:  Negative for congestion, mouth sores and sinus pressure.   Eyes:  Negative for visual disturbance.  Respiratory:  Negative for cough and chest tightness.   Gastrointestinal:  Negative for abdominal pain and nausea.  Genitourinary:  Negative for difficulty urinating, frequency and vaginal pain.  Musculoskeletal:  Positive for back pain and gait problem.  Skin:  Negative for pallor and rash.  Neurological:  Negative for dizziness, tremors, weakness, numbness and headaches.  Psychiatric/Behavioral:  Negative for confusion, decreased concentration and sleep disturbance.     Objective:  BP 122/70   Pulse 77   Temp 98 F (36.7 C)   Ht 5' (1.524 m)   Wt 180 lb 9.6 oz (81.9 kg)   SpO2 97%   BMI 35.27 kg/m   BP Readings from Last 3 Encounters:  03/13/24 122/70  02/14/24 90/60  02/01/24 128/60    Wt Readings from Last 3 Encounters:  03/13/24 180 lb 9.6 oz (81.9 kg)  02/14/24 181 lb 6.4 oz (82.3 kg)  02/01/24 187 lb (84.8 kg)    Physical Exam Constitutional:      General: She is not in acute distress.    Appearance: She is well-developed. She is obese.  HENT:     Head: Normocephalic.     Right Ear: External ear  normal.     Left Ear: External ear normal.     Nose: Nose normal.  Eyes:     General:        Right eye: No discharge.        Left eye: No discharge.     Conjunctiva/sclera: Conjunctivae normal.     Pupils: Pupils are equal, round, and reactive to light.  Neck:     Thyroid : No thyromegaly.     Vascular: No JVD.     Trachea: No tracheal deviation.  Cardiovascular:     Rate and Rhythm: Normal rate and regular rhythm.     Heart sounds: Normal heart sounds.  Pulmonary:     Effort: No  respiratory distress.     Breath sounds: No stridor. No wheezing.  Abdominal:     General: Bowel sounds are normal. There is no distension.     Palpations: Abdomen is soft. There is no mass.     Tenderness: There is no abdominal tenderness. There is no guarding or rebound.  Musculoskeletal:        General: Tenderness present.     Cervical back: Normal range of motion and neck supple. No rigidity.     Right lower leg: No edema.     Left lower leg: No edema.  Lymphadenopathy:     Cervical: No cervical adenopathy.  Skin:    Findings: No erythema or rash.  Neurological:     Mental Status: She is oriented to person, place, and time.     Cranial Nerves: No cranial nerve deficit.     Motor: No abnormal muscle tone.     Coordination: Coordination normal.     Gait: Gait abnormal.     Deep Tendon Reflexes: Reflexes normal.  Psychiatric:        Behavior: Behavior normal.        Thought Content: Thought content normal.        Judgment: Judgment normal.    Lumbar spine with pain Antalgic gait No edema lower extremities  Lab Results  Component Value Date   WBC 5.8 01/28/2024   HGB 10.4 (L) 01/28/2024   HCT 30.9 (L) 01/28/2024   PLT 416.0 (H) 01/28/2024   GLUCOSE 108 (H) 03/13/2024   CHOL 199 06/24/2022   TRIG 144.0 06/24/2022   HDL 55.00 06/24/2022   LDLCALC 115 (H) 06/24/2022   ALT 5 02/14/2024   AST 16 02/14/2024   NA 141 03/13/2024   K 3.9 03/13/2024   CL 106 03/13/2024   CREATININE 1.12  03/13/2024   BUN 20 03/13/2024   CO2 28 03/13/2024   TSH 0.83 06/24/2022   INR 1.0 04/10/2021   HGBA1C 5.5 02/14/2024    MR THORACIC SPINE WO CONTRAST Result Date: 03/07/2024 EXAM: MRI THORACIC SPINE WITHOUT INTRAVENOUS CONTRAST 03/07/2024 03:04:47 PM TECHNIQUE: Multiplanar multisequence MRI of the thoracic spine was performed without the administration of intravenous contrast. COMPARISON: Thoracic spine radiographs 01/28/2024. Cervical spine MRI 01/12/2022. CLINICAL HISTORY: Severe upper thoracic back/chest pain. Rule out thoracic compression fracture. Mid back pain. Patient refused contrast. FINDINGS: BONES AND ALIGNMENT: Interbody ankylosis is present from T3-T6 and T7-T11. There is a hemangioma in the T1 vertebral body. Mild marrow edema is noted in the T2 and T3 vertebral bodies, also present in 2023 and potentially degenerative. Degenerative endplate changes at T11-12 include mild edema. Chronic grade 1 anterolisthesis is seen at C7 on T1, T1 on T2, and T2 on T3. Previous posterior fusion is noted at L1-2 as well as more inferiorly in the lumbar spine, incompletely imaged. No definite acute compression fracture. SPINAL CORD: No definite spinal cord signal abnormality. SOFT TISSUES: Unremarkable. DEGENERATIVE CHANGES: At T2-3, anterolisthesis with bulging uncovered disc, uncovertebral spurring, and facet arthrosis result in moderate spinal stenosis with mild cord flattening and moderate bilateral neural foraminal stenosis. A central disc protrusion at T8-9 results in mild ventral cord flattening without significant spinal stenosis. At T11-12, disc bulging and facet arthrosis result in mild spinal stenosis and severe bilateral neural foraminal stenosis. There is mild scattered disc bulging throughout the remainder of the thoracic spine without additional significant spinal stenosis. Multilevel disc degeneration and interbody ankylosis in the cervical spine is incompletely evaluated. IMPRESSION: 1.  Multilevel thoracic  ankylosis with mild multilevel, degenerative-appearing edema as above. No definite acute compression fracture. 2. Moderate spinal stenosis with mild cord flattening and moderate bilateral neural foraminal stenosis at T2-3. 3. Mild spinal stenosis and severe bilateral neural foraminal stenosis at T11-12. Electronically signed by: Dasie Hamburg MD 03/07/2024 05:09 PM EDT RP Workstation: [id number]    Assessment & Plan:   Problem List Items Addressed This Visit     Cough   No relapse      DOE (dyspnea on exertion)   Resolved on a diuretic Use prn      Edema   Much better.  Will use diuretics as needed      Essential hypertension - Primary   Monitor BP BP Readings from Last 3 Encounters:  03/13/24 122/70  02/14/24 90/60  02/01/24 128/60         Relevant Orders   Basic metabolic panel with GFR (Completed)   Hypothyroidism (acquired)   Relevant Orders   Basic metabolic panel with GFR (Completed)   Lumbar back pain   Uncontrolled pain.  On Norco for LBP 5/325 2 tab q4h prn, Tramadol  (LBP6/10 w/meds, 10/10 w/o meds) On Gabapentin  900 mg/d We should reduce Tylenol  intake Changed to Norco 10/325 mg from Norco 5/325 mg given every 4 hours Check pulse ox at home - 93-95% Adjust bowel program       Relevant Medications   methylPREDNISolone  (MEDROL  DOSEPAK) 4 MG TBPK tablet   Renal insufficiency   CRI. Hydrate well Check BMET      Thoracic spine pain   Better Thor spine MRI w/OA and spinal stenosis - discussed Celebrex  prrn w/caution or Medrol  pack (helped in the past)      Relevant Medications   methylPREDNISolone  (MEDROL  DOSEPAK) 4 MG TBPK tablet   Other Visit Diagnoses       Immunization due       Relevant Orders   Flu vaccine HIGH DOSE PF(Fluzone Trivalent) (Completed)         Meds ordered this encounter  Medications   diazepam  (VALIUM ) 5 MG tablet    Sig: TAKE 1/2-1 TABLET BY MOUTH EVERY 12 HOURS AS NEEDED FOR ANXIETY, INSOMNIA     Dispense:  180 tablet    Refill:  0   methylPREDNISolone  (MEDROL  DOSEPAK) 4 MG TBPK tablet    Sig: As directed    Dispense:  21 tablet    Refill:  0      Follow-up: Return in about 3 months (around 06/13/2024) for a follow-up visit.  Marolyn Noel, MD

## 2024-03-13 NOTE — Assessment & Plan Note (Signed)
 CRI. Hydrate well Check BMET

## 2024-03-13 NOTE — Telephone Encounter (Signed)
 Copied from CRM [id number]. Topic: General - Other >> Mar 13, 2024 11:00 AM Deaijah H wrote: Reason for CRM: Aleck w/ [facility] called in to follow up on 10/8 fax for medical profile for her and husband. Please call [phone]

## 2024-03-14 ENCOUNTER — Ambulatory Visit: Admitting: Internal Medicine

## 2024-03-15 ENCOUNTER — Ambulatory Visit: Payer: Self-pay | Admitting: Internal Medicine

## 2024-03-16 ENCOUNTER — Telehealth: Payer: Self-pay | Admitting: Internal Medicine

## 2024-03-16 NOTE — Telephone Encounter (Signed)
 Documentation received.SABRA given to PCP for review and signature.

## 2024-03-16 NOTE — Telephone Encounter (Signed)
 Form has been filled and placed on CP desk for completion and signature.

## 2024-03-16 NOTE — Telephone Encounter (Signed)
 Patient left paperwork to be filled out - please call when paperwork has been completed.

## 2024-03-16 NOTE — Telephone Encounter (Signed)
 Paperwork left up front in Dr. Alexandra box

## 2024-03-17 ENCOUNTER — Telehealth: Payer: Self-pay

## 2024-03-17 NOTE — Telephone Encounter (Signed)
 Paperwork has filled out and placed on PCP desk for review and signature.

## 2024-03-17 NOTE — Telephone Encounter (Signed)
 Copied from CRM [id number]. Topic: General - Other >> Mar 16, 2024  1:19 PM Rosina BIRCH wrote: Reason for CRM: laura from [facility] called wanting to talk to the nurse regarding the patient and her husband. They do not need a full medical history they just need a latest health and physical and confidential medical profile form that the doctor actually has to compete Contact carolyn [phone]

## 2024-03-20 ENCOUNTER — Telehealth: Payer: Self-pay

## 2024-03-20 NOTE — Assessment & Plan Note (Addendum)
 Much better.  Will use diuretics as needed

## 2024-03-20 NOTE — Telephone Encounter (Signed)
 Awaiting return of paperwork from provider.

## 2024-03-20 NOTE — Assessment & Plan Note (Signed)
No relapse 

## 2024-03-28 ENCOUNTER — Other Ambulatory Visit: Payer: Self-pay

## 2024-03-28 ENCOUNTER — Other Ambulatory Visit (HOSPITAL_COMMUNITY): Payer: Self-pay

## 2024-03-28 MED ORDER — INCOBOTULINUMTOXINA 100 UNITS IM SOLR
100.0000 [IU] | INTRAMUSCULAR | 3 refills | Status: AC
  Filled 2024-03-28: qty 1, 90d supply, fill #0

## 2024-03-28 NOTE — Addendum Note (Signed)
 Addended by: ONEITA NEVELYN BRAVO on: 03/28/2024 09:15 AM   Modules accepted: Orders

## 2024-03-28 NOTE — Telephone Encounter (Signed)
 She has been using 100 units of Xeomin  for the last several appts. Can you please send Xeomin  refills to [facility] as 100 units per dose? We don't have anymore on hand for her upcoming appointment.

## 2024-03-28 NOTE — Progress Notes (Signed)
 [facility] has sent in a new rx for Xeomin . I spoke with the patient, and she's decided not to continue therapy at this time. She plans to cancel her 11/5 appointment and mentioned that she'll reach out in the future if she decides to resume. The patient has not provided a timeframe for resuming therapy. Therefore, we will proceed with disenrolling her from [facility] at this time.

## 2024-03-28 NOTE — Telephone Encounter (Signed)
 REFILL SENT

## 2024-03-29 ENCOUNTER — Telehealth: Payer: Self-pay | Admitting: Neurology

## 2024-03-29 ENCOUNTER — Telehealth: Payer: Self-pay

## 2024-03-29 NOTE — Telephone Encounter (Signed)
 noted

## 2024-03-29 NOTE — Telephone Encounter (Signed)
 Pt called and said appointment is not needed at this time

## 2024-03-29 NOTE — Telephone Encounter (Signed)
 Copied from CRM [id number]. Topic: General - Other >> Mar 28, 2024  4:56 PM Alfonso HERO wrote: Reason for CRM: [facility] called stating they've received all of the info needed for the patient.

## 2024-04-03 ENCOUNTER — Encounter: Payer: Self-pay | Admitting: Radiology

## 2024-04-05 ENCOUNTER — Ambulatory Visit: Admitting: Neurology

## 2024-04-05 DIAGNOSIS — T8130XA Disruption of wound, unspecified, initial encounter: Secondary | ICD-10-CM | POA: Diagnosis not present

## 2024-04-05 DIAGNOSIS — M4316 Spondylolisthesis, lumbar region: Secondary | ICD-10-CM | POA: Diagnosis not present

## 2024-04-05 DIAGNOSIS — Z981 Arthrodesis status: Secondary | ICD-10-CM | POA: Diagnosis not present

## 2024-04-18 DIAGNOSIS — H1820 Unspecified corneal edema: Secondary | ICD-10-CM | POA: Diagnosis not present

## 2024-04-18 DIAGNOSIS — H2511 Age-related nuclear cataract, right eye: Secondary | ICD-10-CM | POA: Diagnosis not present

## 2024-04-18 DIAGNOSIS — H524 Presbyopia: Secondary | ICD-10-CM | POA: Diagnosis not present

## 2024-04-18 DIAGNOSIS — H401131 Primary open-angle glaucoma, bilateral, mild stage: Secondary | ICD-10-CM | POA: Diagnosis not present

## 2024-05-01 DIAGNOSIS — L570 Actinic keratosis: Secondary | ICD-10-CM | POA: Diagnosis not present

## 2024-06-02 ENCOUNTER — Telehealth: Payer: Self-pay

## 2024-06-02 NOTE — Telephone Encounter (Signed)
 MyChart message sent to patient regarding referral placed by Dr. Norleen for imaging.

## 2024-09-06 ENCOUNTER — Ambulatory Visit

## 2024-10-03 ENCOUNTER — Ambulatory Visit
